# Patient Record
Sex: Male | Born: 1950 | Race: White | Hispanic: No | Marital: Married | Smoking: Former smoker
Health system: Southern US, Community
[De-identification: ages and names within clinical notes are randomized; demographics above are authoritative.]

## PROBLEM LIST (undated history)

## (undated) DIAGNOSIS — T7840XA Allergy, unspecified, initial encounter: Secondary | ICD-10-CM

## (undated) DIAGNOSIS — I10 Essential (primary) hypertension: Secondary | ICD-10-CM

## (undated) DIAGNOSIS — K219 Gastro-esophageal reflux disease without esophagitis: Secondary | ICD-10-CM

## (undated) DIAGNOSIS — E785 Hyperlipidemia, unspecified: Secondary | ICD-10-CM

## (undated) DIAGNOSIS — Z8042 Family history of malignant neoplasm of prostate: Secondary | ICD-10-CM

## (undated) DIAGNOSIS — W57XXXA Bitten or stung by nonvenomous insect and other nonvenomous arthropods, initial encounter: Secondary | ICD-10-CM

## (undated) DIAGNOSIS — Z923 Personal history of irradiation: Secondary | ICD-10-CM

## (undated) DIAGNOSIS — J45909 Unspecified asthma, uncomplicated: Secondary | ICD-10-CM

## (undated) DIAGNOSIS — M2041 Other hammer toe(s) (acquired), right foot: Secondary | ICD-10-CM

## (undated) DIAGNOSIS — C801 Malignant (primary) neoplasm, unspecified: Secondary | ICD-10-CM

## (undated) DIAGNOSIS — Z0000 Encounter for general adult medical examination without abnormal findings: Secondary | ICD-10-CM

## (undated) DIAGNOSIS — Z8669 Personal history of other diseases of the nervous system and sense organs: Secondary | ICD-10-CM

## (undated) DIAGNOSIS — M1990 Unspecified osteoarthritis, unspecified site: Secondary | ICD-10-CM

## (undated) DIAGNOSIS — M549 Dorsalgia, unspecified: Secondary | ICD-10-CM

## (undated) DIAGNOSIS — M67431 Ganglion, right wrist: Secondary | ICD-10-CM

## (undated) DIAGNOSIS — C4490 Unspecified malignant neoplasm of skin, unspecified: Secondary | ICD-10-CM

## (undated) DIAGNOSIS — N400 Enlarged prostate without lower urinary tract symptoms: Secondary | ICD-10-CM

## (undated) DIAGNOSIS — Z803 Family history of malignant neoplasm of breast: Secondary | ICD-10-CM

## (undated) DIAGNOSIS — R600 Localized edema: Secondary | ICD-10-CM

## (undated) DIAGNOSIS — S82899A Other fracture of unspecified lower leg, initial encounter for closed fracture: Secondary | ICD-10-CM

## (undated) DIAGNOSIS — I4892 Unspecified atrial flutter: Secondary | ICD-10-CM

## (undated) HISTORY — DX: Other hammer toe(s) (acquired), right foot: M20.41

## (undated) HISTORY — DX: Other fracture of unspecified lower leg, initial encounter for closed fracture: S82.899A

## (undated) HISTORY — PX: OTHER SURGICAL HISTORY: SHX169

## (undated) HISTORY — DX: Hyperlipidemia, unspecified: E78.5

## (undated) HISTORY — PX: HERNIA REPAIR: SHX51

## (undated) HISTORY — PX: ANKLE SURGERY: SHX546

## (undated) HISTORY — DX: Unspecified malignant neoplasm of skin, unspecified: C44.90

## (undated) HISTORY — DX: Family history of malignant neoplasm of prostate: Z80.42

## (undated) HISTORY — DX: Benign prostatic hyperplasia without lower urinary tract symptoms: N40.0

## (undated) HISTORY — DX: Family history of malignant neoplasm of breast: Z80.3

## (undated) HISTORY — DX: Unspecified asthma, uncomplicated: J45.909

## (undated) HISTORY — DX: Malignant (primary) neoplasm, unspecified: C80.1

## (undated) HISTORY — DX: Dorsalgia, unspecified: M54.9

## (undated) HISTORY — DX: Essential (primary) hypertension: I10

## (undated) HISTORY — PX: TONSILLECTOMY: SUR1361

## (undated) HISTORY — DX: Allergy, unspecified, initial encounter: T78.40XA

## (undated) HISTORY — PX: FRACTURE SURGERY: SHX138

## (undated) HISTORY — DX: Ganglion, right wrist: M67.431

## (undated) HISTORY — DX: Gastro-esophageal reflux disease without esophagitis: K21.9

## (undated) HISTORY — DX: Localized edema: R60.0

## (undated) HISTORY — DX: Personal history of other diseases of the nervous system and sense organs: Z86.69

## (undated) HISTORY — PX: EYE SURGERY: SHX253

## (undated) HISTORY — DX: Bitten or stung by nonvenomous insect and other nonvenomous arthropods, initial encounter: W57.XXXA

## (undated) HISTORY — DX: Unspecified osteoarthritis, unspecified site: M19.90

## (undated) HISTORY — DX: Encounter for general adult medical examination without abnormal findings: Z00.00

---

## 1996-10-13 DIAGNOSIS — S82899A Other fracture of unspecified lower leg, initial encounter for closed fracture: Secondary | ICD-10-CM

## 1996-10-13 HISTORY — DX: Other fracture of unspecified lower leg, initial encounter for closed fracture: S82.899A

## 2002-08-04 HISTORY — PX: COLONOSCOPY: SHX174

## 2002-08-09 ENCOUNTER — Encounter (INDEPENDENT_AMBULATORY_CARE_PROVIDER_SITE_OTHER): Payer: Self-pay | Admitting: *Deleted

## 2006-09-07 ENCOUNTER — Encounter: Admission: RE | Admit: 2006-09-07 | Discharge: 2006-09-07 | Payer: Self-pay | Admitting: Family Medicine

## 2006-10-13 HISTORY — PX: INGUINAL HERNIA REPAIR: SHX194

## 2007-08-19 ENCOUNTER — Ambulatory Visit (HOSPITAL_COMMUNITY): Admission: RE | Admit: 2007-08-19 | Discharge: 2007-08-19 | Payer: Self-pay | Admitting: Surgery

## 2009-01-05 ENCOUNTER — Ambulatory Visit: Payer: Self-pay | Admitting: Internal Medicine

## 2009-01-05 DIAGNOSIS — J309 Allergic rhinitis, unspecified: Secondary | ICD-10-CM

## 2009-01-05 DIAGNOSIS — E782 Mixed hyperlipidemia: Secondary | ICD-10-CM

## 2009-01-05 DIAGNOSIS — F528 Other sexual dysfunction not due to a substance or known physiological condition: Secondary | ICD-10-CM

## 2009-01-05 DIAGNOSIS — I10 Essential (primary) hypertension: Secondary | ICD-10-CM | POA: Insufficient documentation

## 2009-01-05 LAB — CONVERTED CEMR LAB
BUN: 12 mg/dL (ref 6–23)
Bilirubin, Direct: 0.1 mg/dL (ref 0.0–0.3)
Calcium: 9.4 mg/dL (ref 8.4–10.5)
Cholesterol: 173 mg/dL (ref 0–200)
Creatinine, Ser: 0.8 mg/dL (ref 0.4–1.5)
Eosinophils Relative: 2.8 % (ref 0.0–5.0)
GFR calc non Af Amer: 106 mL/min (ref >60)
Glucose, Bld: 109 mg/dL — ABNORMAL HIGH (ref 70–99)
Hemoglobin: 16.1 g/dL (ref 13.0–17.0)
Lymphocytes Relative: 20.4 % (ref 12.0–46.0)
MCHC: 35.3 g/dL (ref 30.0–36.0)
MCV: 94.6 fL (ref 78.0–100.0)
Monocytes Relative: 9.3 % (ref 3.0–12.0)
Platelets: 209 10*3/uL (ref 150.0–400.0)
RBC: 4.83 M/uL (ref 4.22–5.81)
RDW: 12.1 % (ref 11.5–14.6)
TSH: 0.99 microintl units/mL (ref 0.35–5.50)
Testosterone: 367.81 ng/dL (ref 350.00–890.00)
Total Bilirubin: 1.5 mg/dL — ABNORMAL HIGH (ref 0.3–1.2)
Total CHOL/HDL Ratio: 3
WBC: 9.4 10*3/uL (ref 4.5–10.5)

## 2009-01-07 ENCOUNTER — Encounter: Payer: Self-pay | Admitting: Internal Medicine

## 2009-02-01 ENCOUNTER — Ambulatory Visit: Payer: Self-pay | Admitting: Internal Medicine

## 2009-02-01 DIAGNOSIS — L259 Unspecified contact dermatitis, unspecified cause: Secondary | ICD-10-CM

## 2009-02-15 ENCOUNTER — Telehealth: Payer: Self-pay | Admitting: Internal Medicine

## 2009-03-01 ENCOUNTER — Ambulatory Visit: Payer: Self-pay | Admitting: Internal Medicine

## 2009-03-01 ENCOUNTER — Ambulatory Visit: Payer: Self-pay | Admitting: Diagnostic Radiology

## 2009-03-01 ENCOUNTER — Ambulatory Visit (HOSPITAL_BASED_OUTPATIENT_CLINIC_OR_DEPARTMENT_OTHER): Admission: RE | Admit: 2009-03-01 | Discharge: 2009-03-01 | Payer: Self-pay | Admitting: Internal Medicine

## 2009-03-01 ENCOUNTER — Telehealth: Payer: Self-pay | Admitting: Family Medicine

## 2009-03-01 ENCOUNTER — Telehealth: Payer: Self-pay | Admitting: Internal Medicine

## 2009-03-01 DIAGNOSIS — J189 Pneumonia, unspecified organism: Secondary | ICD-10-CM | POA: Insufficient documentation

## 2009-03-01 DIAGNOSIS — R509 Fever, unspecified: Secondary | ICD-10-CM

## 2009-03-01 LAB — CONVERTED CEMR LAB
ALT: 26 units/L (ref 0–53)
AST: 19 units/L (ref 0–37)
Alkaline Phosphatase: 44 units/L (ref 39–117)
Basophils Absolute: 0 10*3/uL (ref 0.0–0.1)
Bilirubin, Direct: 0.1 mg/dL (ref 0.0–0.3)
CO2: 30 meq/L (ref 19–32)
Chloride: 102 meq/L (ref 96–112)
Creatinine, Ser: 1.1 mg/dL (ref 0.4–1.5)
Eosinophils Relative: 0.8 % (ref 0.0–5.0)
GFR calc non Af Amer: 73.36 mL/min (ref >60)
HCT: 44.8 % (ref 39.0–52.0)
Lymphocytes Relative: 10 % — ABNORMAL LOW (ref 12.0–46.0)
MCHC: 34.7 g/dL (ref 30.0–36.0)
MCV: 93.3 fL (ref 78.0–100.0)
Monocytes Relative: 11.5 % (ref 3.0–12.0)
Platelets: 173 10*3/uL (ref 150.0–400.0)

## 2009-04-06 ENCOUNTER — Ambulatory Visit (HOSPITAL_BASED_OUTPATIENT_CLINIC_OR_DEPARTMENT_OTHER): Admission: RE | Admit: 2009-04-06 | Discharge: 2009-04-06 | Payer: Self-pay | Admitting: Internal Medicine

## 2009-04-06 ENCOUNTER — Ambulatory Visit: Payer: Self-pay | Admitting: Internal Medicine

## 2009-04-06 ENCOUNTER — Ambulatory Visit: Payer: Self-pay | Admitting: Diagnostic Radiology

## 2009-04-06 ENCOUNTER — Telehealth: Payer: Self-pay | Admitting: Internal Medicine

## 2009-06-26 ENCOUNTER — Telehealth: Payer: Self-pay | Admitting: Internal Medicine

## 2009-08-01 ENCOUNTER — Encounter: Payer: Self-pay | Admitting: Internal Medicine

## 2009-10-03 ENCOUNTER — Encounter: Payer: Self-pay | Admitting: Internal Medicine

## 2009-10-15 ENCOUNTER — Ambulatory Visit: Payer: Self-pay | Admitting: Internal Medicine

## 2009-10-15 DIAGNOSIS — R109 Unspecified abdominal pain: Secondary | ICD-10-CM

## 2009-10-16 ENCOUNTER — Telehealth: Payer: Self-pay | Admitting: Internal Medicine

## 2009-10-16 LAB — CONVERTED CEMR LAB
CO2: 26 meq/L (ref 19–32)
Calcium: 9.7 mg/dL (ref 8.4–10.5)
Chloride: 100 meq/L (ref 96–112)
Potassium: 4.9 meq/L (ref 3.5–5.3)
Sodium: 138 meq/L (ref 135–145)

## 2009-11-02 ENCOUNTER — Ambulatory Visit: Payer: Self-pay | Admitting: Internal Medicine

## 2009-11-02 DIAGNOSIS — L0100 Impetigo, unspecified: Secondary | ICD-10-CM | POA: Insufficient documentation

## 2009-11-07 ENCOUNTER — Ambulatory Visit: Payer: Self-pay | Admitting: Internal Medicine

## 2009-11-07 DIAGNOSIS — J029 Acute pharyngitis, unspecified: Secondary | ICD-10-CM

## 2010-03-20 ENCOUNTER — Encounter: Payer: Self-pay | Admitting: Internal Medicine

## 2010-03-20 LAB — CONVERTED CEMR LAB
ALT: 21 units/L (ref 0–53)
AST: 20 units/L (ref 0–37)
Albumin: 4.8 g/dL (ref 3.5–5.2)
Alkaline Phosphatase: 43 units/L (ref 39–117)
Basophils Relative: 0 % (ref 0–1)
Bilirubin, Direct: 0.2 mg/dL (ref 0.0–0.3)
Calcium: 9.7 mg/dL (ref 8.4–10.5)
Eosinophils Absolute: 0.3 10*3/uL (ref 0.0–0.7)
Eosinophils Relative: 3 % (ref 0–5)
Hemoglobin: 15.4 g/dL (ref 13.0–17.0)
Indirect Bilirubin: 0.8 mg/dL (ref 0.0–0.9)
LDL Cholesterol: 103 mg/dL — ABNORMAL HIGH (ref 0–99)
Lymphocytes Relative: 24 % (ref 12–46)
Monocytes Absolute: 0.7 10*3/uL (ref 0.1–1.0)
Monocytes Relative: 9 % (ref 3–12)
Potassium: 3.7 meq/L (ref 3.5–5.3)
RBC: 4.75 M/uL (ref 4.22–5.81)
RDW: 13.5 % (ref 11.5–15.5)
Sodium: 139 meq/L (ref 135–145)
Total Bilirubin: 1 mg/dL (ref 0.3–1.2)
Total Protein: 7.5 g/dL (ref 6.0–8.3)
WBC: 8.2 10*3/uL (ref 4.0–10.5)

## 2010-03-26 ENCOUNTER — Ambulatory Visit: Payer: Self-pay | Admitting: Internal Medicine

## 2010-03-26 DIAGNOSIS — K219 Gastro-esophageal reflux disease without esophagitis: Secondary | ICD-10-CM

## 2010-07-05 ENCOUNTER — Encounter (INDEPENDENT_AMBULATORY_CARE_PROVIDER_SITE_OTHER): Payer: Self-pay | Admitting: *Deleted

## 2010-07-05 ENCOUNTER — Encounter: Payer: Self-pay | Admitting: Internal Medicine

## 2010-07-05 DIAGNOSIS — L989 Disorder of the skin and subcutaneous tissue, unspecified: Secondary | ICD-10-CM | POA: Insufficient documentation

## 2010-07-17 ENCOUNTER — Telehealth: Payer: Self-pay | Admitting: Internal Medicine

## 2010-07-22 ENCOUNTER — Telehealth: Payer: Self-pay | Admitting: Internal Medicine

## 2010-07-29 ENCOUNTER — Ambulatory Visit: Payer: Self-pay | Admitting: Internal Medicine

## 2010-07-29 ENCOUNTER — Encounter (INDEPENDENT_AMBULATORY_CARE_PROVIDER_SITE_OTHER): Payer: Self-pay | Admitting: *Deleted

## 2010-08-05 ENCOUNTER — Telehealth: Payer: Self-pay | Admitting: Internal Medicine

## 2010-08-08 ENCOUNTER — Telehealth (INDEPENDENT_AMBULATORY_CARE_PROVIDER_SITE_OTHER): Payer: Self-pay | Admitting: *Deleted

## 2010-08-14 ENCOUNTER — Encounter: Payer: Self-pay | Admitting: Internal Medicine

## 2010-08-22 ENCOUNTER — Ambulatory Visit: Payer: Self-pay | Admitting: Internal Medicine

## 2010-08-22 LAB — HM COLONOSCOPY

## 2010-08-29 ENCOUNTER — Encounter: Payer: Self-pay | Admitting: Internal Medicine

## 2010-10-25 ENCOUNTER — Telehealth: Payer: Self-pay | Admitting: Internal Medicine

## 2010-11-12 NOTE — Progress Notes (Signed)
  Phone Note Outgoing Call   Summary of Call: call pt - electrolytes and kidney function is normal Initial call taken by: D. Thomos Lemons DO,  October 16, 2009 9:24 AM  Follow-up for Phone Call        informed pt. that his electrolytes and kidney function test were normal.Jennifer Rich  October 16, 2009 1:37 PM

## 2010-11-12 NOTE — Progress Notes (Signed)
Summary: Dermatology  ---- Converted from flag ---- ---- 07/17/2010 8:35 AM, D. Thomos Lemons DO wrote: plz find out whether this pt already seen by dermatologist ------------------------------  Phone Note Outgoing Call   Call placed by: Glendell Docker CMA,  July 17, 2010 9:48 AM Call placed to: Patient Summary of Call: call placed to patient  at [phone] regarding dermatology appointment. He states that he had a follow up a week ago today with Dr  Katrinka Blazing. He states he had the area lanced and since then it is healing well. He states he has been keeping a bandaid on it with ointment Initial call taken by: Glendell Docker CMA,  July 17, 2010 9:51 AM

## 2010-11-12 NOTE — Letter (Signed)
Summary: Pre Visit Letter Revised  [facility]  [address] [place], [place] [address]   Phone: [phone]  Fax: [phone]        07/05/2010 MRN: [id number] William Phillips [address] [place], [place]  [address]             Procedure Date:  08/13/2010   Welcome to the Gastroenterology Division at [facility].    You are scheduled to see a nurse for your pre-procedure visit on 07/29/2010 at 1:30PM on the 3rd floor at [facility], [address]  We ask that you try to arrive at our office 15 minutes prior to your appointment time to allow for check-in.  Please take a minute to review the attached form.  If you answer "Yes" to one or more of the questions on the first page, we ask that you call the person listed at your earliest opportunity.  If you answer "No" to all of the questions, please complete the rest of the form and bring it to your appointment.    Your nurse visit will consist of discussing your medical and surgical history, your immediate family medical history, and your medications.   If you are unable to list all of your medications on the form, please bring the medication bottles to your appointment and we will list them.  We will need to be aware of both prescribed and over the counter drugs.  We will need to know exact dosage information as well.    Please be prepared to read and sign documents such as consent forms, a financial agreement, and acknowledgement forms.  If necessary, and with your consent, a friend or relative is welcome to sit-in on the nurse visit with you.  Please bring your insurance card so that we may make a copy of it.  If your insurance requires a referral to see a specialist, please bring your referral form from your primary care physician.  No co-pay is required for this nurse visit.     If you cannot keep your appointment, please call [phone] to cancel or reschedule prior to your appointment date.  This  allows [place] the opportunity to schedule an appointment for another patient in need of care.    Thank you for choosing [facility] for your medical needs.  We appreciate the opportunity to care for you.  Please visit [place] at our website  to learn more about our practice.  Sincerely, The Gastroenterology Division

## 2010-11-12 NOTE — Assessment & Plan Note (Signed)
Summary: 6 months rov-ch   Vital Signs:  Patient profile:   60 year old Phillips Weight:      198 pounds O2 Sat:      96 % on Room air Pulse rate:   William / minute BP sitting:   120 / 80  O2 Flow:  Room air  Primary Care Provider:  Dondra Spry DO   History of Present Illness:  Hypertension Follow-Up      This is a 60 year old man who presents for Hypertension follow-up.  The patient denies lightheadedness and headaches.  The patient denies the following associated symptoms: chest pain.  Compliance with medications (by patient report) has been near 100%.  The patient reports that dietary compliance has been fair.    left groin - had diagnostic lap.  scar tissue.  currently asymptomatic  hx of pna last year - no recurrent cough  Allergies: 1)  ! Pcn  Past History:  Past Medical History: Allergic rhinitis Hyperlipidemia   Hypertension  Hx of carpal tunnel syndrome Ankle fracture 1998   Family History: Family History High cholesterol Family History Hypertension CAD - father MI age 62 DM II - MGF      Physical Exam  General:  alert, well-developed, and well-nourished.   Neck:  No deformities, masses, or tenderness noted. Lungs:  normal respiratory effort and normal breath sounds.   Heart:  normal rate, regular rhythm, and no gallop.   Extremities:  trace left pedal edema and trace right pedal edema.     Impression & Recommendations:  Problem # 1:  HYPERTENSION (ICD-401.9) well controlled.  Maintain current medication regimen.  His updated medication list for this problem includes:    Quinapril-hydrochlorothiazide 20-12.5 Mg Tabs (Quinapril-hydrochlorothiazide) .Marland Kitchen... Take 1 tablet by mouth once a day    Amlodipine Besylate 5 Mg Tabs (Amlodipine besylate) .Marland Kitchen... Take 1 tablet by mouth once a day  Orders: T-Basic Metabolic Panel [phone])  BP today: 120/80 Prior BP: 120/70 (04/06/2009)  Labs Reviewed: K+: 3.9 (03/01/2009) Creat: : 1.1 (03/01/2009)   Chol:  173 (01/05/2009)   HDL: 61.50 (01/05/2009)   LDL: 93 (01/05/2009)   TG: 95.0 (01/05/2009)  Problem # 2:  INGUINAL PAIN, LEFT (ICD-789.09) Pt seen by his surgeon - Dr. Michaell Cowing.  He had diagnostic laporascopy.  he reports scar tissue.  no recurrent hernia.  he decreased physical activity.  he is asymptomatic  His updated medication list for this problem includes:    Baby Aspirin 81 Mg Chew (Aspirin) .Marland Kitchen... Take 1 tablet by mouth once a day  Complete Medication List: 1)  Quinapril-hydrochlorothiazide 20-12.5 Mg Tabs (Quinapril-hydrochlorothiazide) .... Take 1 tablet by mouth once a day 2)  Amlodipine Besylate 5 Mg Tabs (Amlodipine besylate) .... Take 1 tablet by mouth once a day 3)  Simvastatin 20 Mg Tabs (Simvastatin) .... Take 1 tab by mouth at bedtime 4)  Baby Aspirin 81 Mg Chew (Aspirin) .... Take 1 tablet by mouth once a day 5)  Multivitamins Tabs (Multiple vitamin) .... Take 1 tablet by mouth once a day 6)  Viagra 100 Mg Tabs (Sildenafil citrate) .... 1/2 to one tab by mouth once daily as needed 7)  Triamcinolone Acetonide 0.1 % Crea (Triamcinolone acetonide) .... Apply two times a day  Patient Instructions: 1)  Please schedule a follow-up appointment in 6 months for CPX 2)  BMP prior to visit, ICD-9: 401.9 3)  Hepatic Panel prior to visit, ICD-9: 272.4 4)  Lipid Panel prior to visit, ICD-9: 272.4 5)  TSH prior to visit, ICD-9: 272.4 6)  CBC w/ Diff prior to visit, ICD-9: 401.9 7)  Please return for lab work one (1) week before your next appointment.  Prescriptions: TRIAMCINOLONE ACETONIDE 0.1 % CREA (TRIAMCINOLONE ACETONIDE) apply two times a day  #30 grams x 1   Entered and Authorized by:   D. Thomos Lemons DO   Signed by:   D. Thomos Lemons DO on 10/15/2009   Method used:   [facility]        [id number]       [address] [place], [place]  [address]       Ph: [phone] or [phone]       Fax: [phone]   RxID:    [id number] SIMVASTATIN 20 MG TABS (SIMVASTATIN) Take 1 tab by mouth at bedtime  #90 x 3   Entered and Authorized by:   D. Thomos Lemons DO   Signed by:   D. Thomos Lemons DO on 10/15/2009   Method used:   Electronically to        [facility] [phone]* (retail)       [address] [place], [place]  [address]       Ph: [phone]       Fax: [phone]   RxID:   [id number] AMLODIPINE BESYLATE 5 MG TABS (AMLODIPINE BESYLATE) Take 1 tablet by mouth once a day  #90 x 3   Entered and Authorized by:   D. Thomos Lemons DO   Signed by:   D. Thomos Lemons DO on 10/15/2009   Method used:   Electronically to        [facility] [phone]* (retail)       [address] [place], [place]  [address]       Ph: [phone]       Fax: [phone]   RxID:   [id number] QUINAPRIL-HYDROCHLOROTHIAZIDE 20-12.5 MG TABS (QUINAPRIL-HYDROCHLOROTHIAZIDE) Take 1 tablet by mouth once a day  #90 x 3   Entered and Authorized by:   D. Thomos Lemons DO   Signed by:   D. Thomos Lemons DO on 10/15/2009   Method used:   Electronically to        [facility] [phone]* (retail)       [address] [place], [place]  [address]       Ph: [phone]       Fax: [phone]   RxID:   [id number]      Influenza Immunization History:    Influenza # 1:  Historical (07/25/2009)

## 2010-11-12 NOTE — Letter (Signed)
Summary: [facility] Instructions  [facility]  [address] [place], [place] [address]   Phone: [phone]  Fax: [phone]       William Phillips    1951-08-12    MRN: [id number]        Procedure Day /Date:  Thursday 08/22/2010     Arrival Time: 2:30 pm     Procedure Time: 3:30 pm     Location of Procedure:                    _x _   [facility] (4th Floor)   PREPARATION FOR COLONOSCOPY WITH MOVIPREP   Starting 5 days prior to your procedure Saturday 11/5 do not eat nuts, seeds, popcorn, corn, beans, peas,  salads, or any raw vegetables.  Do not take any fiber supplements (e.g. Metamucil, Citrucel, and Benefiber).  THE DAY BEFORE YOUR PROCEDURE         DATE: Wednesday 11/9 1.  Drink clear liquids the entire day-NO SOLID FOOD  2.  Do not drink anything colored red or purple.  Avoid juices with pulp.  No orange juice.  3.  Drink at least 64 oz. (8 glasses) of fluid/clear liquids during the day to prevent dehydration and help the prep work efficiently.  CLEAR LIQUIDS INCLUDE: Water Jello Ice Popsicles Tea (sugar ok, no milk/cream) Powdered fruit flavored drinks Coffee (sugar ok, no milk/cream) Gatorade Juice: apple, white grape, white cranberry  Lemonade Clear bullion, consomm, broth Carbonated beverages (any kind) Strained chicken noodle soup Hard Candy                             4.  In the morning, mix first dose of MoviPrep solution:    Empty 1 Pouch A and 1 Pouch B into the disposable container    Add lukewarm drinking water to the top line of the container. Mix to dissolve    Refrigerate (mixed solution should be used within 24 hrs)  5.  Begin drinking the prep at 5:00 p.m. The MoviPrep container is divided by 4 marks.   Every 15 minutes drink the solution down to the next mark (approximately 8 oz) until the full liter is complete.   6.  Follow completed prep with 16 oz of clear liquid of your choice (Nothing red or purple).   Continue to drink clear liquids until bedtime.  7.  Before going to bed, mix second dose of MoviPrep solution:    Empty 1 Pouch A and 1 Pouch B into the disposable container    Add lukewarm drinking water to the top line of the container. Mix to dissolve    Refrigerate  THE DAY OF YOUR PROCEDURE      DATE: Thursday 08/22/2010  Beginning at 10:30 a.m. (5 hours before procedure):         1. Every 15 minutes, drink the solution down to the next mark (approx 8 oz) until the full liter is complete.  2. Follow completed prep with 16 oz. of clear liquid of your choice.    3. You may drink clear liquids until 1:30 pm  (2 HOURS BEFORE PROCEDURE).   MEDICATION INSTRUCTIONS  Unless otherwise instructed, you should take regular prescription medications with a small sip of water   as early as possible the morning of your procedure.  Additional medication instructions: Do not take Quinapril/HCTZ day of procedure.         OTHER  INSTRUCTIONS  You will need a responsible adult at least 59 years of age to accompany you and drive you home.   This person must remain in the waiting room during your procedure.  Wear loose fitting clothing that is easily removed.  Leave jewelry and other valuables at home.  However, you may wish to bring a book to read or  an iPod/MP3 player to listen to music as you wait for your procedure to start.  Remove all body piercing jewelry and leave at home.  Total time from sign-in until discharge is approximately 2-3 hours.  You should go home directly after your procedure and rest.  You can resume normal activities the  day after your procedure.  The day of your procedure you should not:   Drive   Make legal decisions   Operate machinery   Drink alcohol   Return to work  You will receive specific instructions about eating, activities and medications before you leave.    The above instructions have been reviewed and explained to me by   Ezra Sites RN  July 29, 2010 3:14 PM    I fully understand and can verbalize these instructions _____________________________ Date _________

## 2010-11-12 NOTE — Assessment & Plan Note (Signed)
Summary: SORE THROAT WHITE SPOTS IN THROAT/MHF   Vital Signs:  Patient profile:   60 year old male Weight:      191.50 pounds BMI:     26.07 O2 Sat:      97 % on Room air Temp:     98.0 degrees F oral Pulse rate:   76 / minute Pulse rhythm:   regular Resp:     16 per minute BP sitting:   132 / 90  (left arm) Cuff size:   large  Vitals Entered By: Glendell Docker CMA (November 07, 2009 11:53 AM)  O2 Flow:  Room air  Primary Care Provider:  D. Thomos Lemons DO  CC:  Sore Throat.  History of Present Illness: 60 y/o white male recently seen for peri oral rash c/o sore throat and mouth soreness. prev rash better but not resolved no fever or chills  Allergies: 1)  ! Pcn  Past History:  Past Medical History: Allergic rhinitis Hyperlipidemia    Hypertension  Hx of carpal tunnel syndrome  Ankle fracture 1998   Past Surgical History: Inguinal herniorrhaphy  2008 Colonoscopy 08/04/2002  (Normal Exam - Dr. [name])       Family History: Family History High cholesterol Family History Hypertension CAD - father MI age 52 DM II - MGF        Social History: Occupation: Airline pilot  (Astronomer- chemicals, seed, erosion control) Married- 35 years 21 daughter -Holiday representative in   ([place] Previous smoker - 5-10 pack yrs  Alcohol use-yes (2-3 glasses of wine)      Physical Exam  General:  alert, well-developed, and well-nourished.   Ears:  R ear normal and L ear normal.   Mouth:  pharynx slightly red.  peri oral redness (improved) Abdomen:  soft, non-tender, and normal bowel sounds.   Extremities:  No lower extremity edema    Impression & Recommendations:  Problem # 1:  PHARYNGITIS (ICD-462) I suspect sore throat from oral thrush.  use clotrimazole troche.  Patient advised to call office if symptoms persist or worsen.  His updated medication list for this problem includes:    Baby Aspirin 81 Mg Chew (Aspirin) .Marland Kitchen... Take 1 tablet by mouth once a day    Clindamycin  Hcl 300 Mg Caps (Clindamycin hcl) ..... One by mouth tid    Clotrimazole 10 Mg Troc (Clotrimazole) ..... Use 5 x day  Complete Medication List: 1)  Quinapril-hydrochlorothiazide 20-12.5 Mg Tabs (Quinapril-hydrochlorothiazide) .... Take 1 tablet by mouth once a day 2)  Amlodipine Besylate 5 Mg Tabs (Amlodipine besylate) .... Take 1 tablet by mouth once a day 3)  Simvastatin 20 Mg Tabs (Simvastatin) .... Take 1 tab by mouth at bedtime 4)  Baby Aspirin 81 Mg Chew (Aspirin) .... Take 1 tablet by mouth once a day 5)  Multivitamins Tabs (Multiple vitamin) .... Take 1 tablet by mouth once a day 6)  Viagra 100 Mg Tabs (Sildenafil citrate) .... 1/2 to one tab by mouth once daily as needed 7)  Triamcinolone Acetonide 0.1 % Crea (Triamcinolone acetonide) .... Apply two times a day 8)  Clindamycin Hcl 300 Mg Caps (Clindamycin hcl) .... One by mouth tid 9)  Mupirocin 2 % Oint (Mupirocin) .... Apply three times a day 10)  Clotrimazole 10 Mg Troc (Clotrimazole) .... Use 5 x day  Patient Instructions: 1)  Call our office if your symptoms do not  improve or gets worse. Prescriptions: CLOTRIMAZOLE 10 MG TROC (CLOTRIMAZOLE) use 5 x day  #50 x  0   Entered and Authorized by:   D. Thomos Lemons DO   Signed by:   D. Thomos Lemons DO on 11/07/2009   Method used:   [facility]        [id number]       [address]       [place], [place]  [address]       Ph: [phone] or [phone]       Fax: [phone]   RxID:   [id number]   Current Allergies (reviewed today): ! PCN

## 2010-11-12 NOTE — Progress Notes (Signed)
Summary: Due for a Colon?  Phone Note Call from Patient Call back at [phone] cell   Call For: Dr [name] Summary of Call: Was told he would get a call back after his chart was reviewed and decided if another Colon is needed. Initial call taken by: Leanor Kail [facility],  August 05, 2010 1:39 PM  Follow-up for Phone Call        called patient and talked to wife.  Advised it was ok to keep appt. as scheduled.   Follow-up by: Milford Cage NCMA,  August 05, 2010 4:56 PM

## 2010-11-12 NOTE — Assessment & Plan Note (Signed)
Summary: cpx/mhf rsc with pt/mhf--Rm 2   Vital Signs:  Patient profile:   60 year old male Height:      72 inches Weight:      186.75 pounds BMI:     25.42 Temp:     98.3 degrees F oral Pulse rate:   78 / minute Pulse rhythm:   regular Resp:     16 per minute BP sitting:   118 / 76  (right arm) Cuff size:   regular  Vitals Entered By: Mervin Kung CMA (March 26, 2010 8:26 AM) CC: Room 2  Physical. Is Patient Diabetic? No Comments Pt has completed Clindamycin.   Primary Care Provider:  Dondra Spry DO  CC:  Room 2  Physical..  History of Present Illness: 60 y/o with hx of htn and hyperlipidemia for routine cpx overall doing well,  good med compliance  Allergies: 1)  ! Pcn  Past History:  Past Medical History: Allergic rhinitis Hyperlipidemia    Hypertension   Hx of carpal tunnel syndrome  Ankle fracture 1998   Past Surgical History: Inguinal herniorrhaphy  2008 Colonoscopy 08/04/2002  (Normal Exam - Dr. 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    Physical Exam  General:  alert, well-developed, and well-nourished.   Head:  normocephalic and atraumatic.   Mouth:  pharynx pink and moist.   Neck:  No deformities, masses, or tenderness noted.no carotid bruits.   Lungs:  normal respiratory effort and normal breath sounds.   Heart:  normal rate, regular rhythm, no murmur, and no gallop.   Abdomen:  soft, non-tender, normal bowel sounds, no hepatomegaly, and no splenomegaly.     Extremities:  No lower extremity edema  Neurologic:  cranial nerves II-XII intact and gait normal.   Psych:  normally interactive, good eye contact, not anxious appearing, and not depressed appearing.     Impression & Recommendations:  Problem # 1:  HEALTH MAINTENANCE EXAM (ICD-V70.0)  Reviewed adult health maintenance protocols.  I encouraged resistance exercises to maintain muscle mass.  Colonoscopy: normal (01/09/2004) Td Booster: Tdap (01/07/2005)   Flu Vax: Historical (07/25/2009)   Chol: 191 (03/20/2010)   HDL: 63 (03/20/2010)   LDL: 103 (03/20/2010)   TG: 127 (03/20/2010) TSH: 1.572 (03/20/2010)   PSA: 1.16 (01/05/2009)  Orders: EKG w/ Interpretation (93000)  Problem # 2:  GERD (ICD-530.81) Pt with intermittent reflux symptoms.  he notes occ tight sensation in upper esophagus.  no neck mass. Use PPI as directed.  anti reflux handout provided.  His updated medication list for this problem includes:    Omeprazole 20 Mg Cpdr (Omeprazole) ..... One by mouth once daily 15 mins before am  Complete Medication List: 1)  Quinapril-hydrochlorothiazide 20-12.5 Mg Tabs (Quinapril-hydrochlorothiazide) .... Take 1 tablet by mouth once a day 2)  Amlodipine Besylate 5 Mg Tabs (Amlodipine besylate) .... Take 1 tablet by mouth once a day 3)  Simvastatin 20 Mg Tabs (Simvastatin) .... Take 1 tab by mouth  at bedtime 4)  Baby Aspirin 81 Mg Chew (Aspirin) .... Take 1 tablet by mouth once a day 5)  Multivitamins Tabs (Multiple vitamin) .... Take 1 tablet by mouth once a day 6)  Viagra 100 Mg Tabs (Sildenafil citrate) .... 1/2 to one tab by mouth once daily as needed 7)  Triamcinolone Acetonide 0.1 % Crea (Triamcinolone acetonide) .... Apply two times a day 8)  Omeprazole 20 Mg Cpdr (Omeprazole) .... One by mouth once daily 15 mins before am  Patient Instructions: 1)  If you stop taking omeprazole in 3 to 6 months,  use zantac (ranitidine 150 mg two times a day x 1 week, then by mouth once daily  x 1 week) to avoid rebound symptoms 2)  Please schedule a follow-up appointment in 6 months. Prescriptions: OMEPRAZOLE 20 MG CPDR (OMEPRAZOLE) one by mouth once daily 15 mins before AM  #90 x 1   Entered and Authorized by:   D. Thomos Lemons DO   Signed by:   D. Thomos Lemons DO on 03/26/2010   Method used:   [facility]        [id number]       [address]       [place], [place]  [address]       Ph: [phone] or [phone]       Fax: [phone]   RxID:   [id number]       Current Allergies (reviewed today): ! PCN

## 2010-11-12 NOTE — Miscellaneous (Signed)
Summary: [facility]  Clinical Lists Changes  Medications: Added new medication of MOVIPREP 100 GM  SOLR (PEG-KCL-NACL-NASULF-NA ASC-C) As per prep instructions. - Signed Rx of MOVIPREP 100 GM  SOLR (PEG-KCL-NACL-NASULF-NA ASC-C) As per prep instructions.;  #1 x 0;  Signed;  Entered by: Ezra Sites RN;  Authorized by: Hilarie Fredrickson MD;  Method used[phone] [address] [place], [place], [place]  [address], Ph: [phone] or [phone], Fax: [phone] Observations: Added new observation of ALLERGY REV: Done (07/29/2010 13:39)    Prescriptions: MOVIPREP 100 GM  SOLR (PEG-KCL-NACL-NASULF-NA ASC-C) As per prep instructions.  #1 x 0   Entered by:   Ezra Sites RN   Authorized by:   Hilarie Fredrickson MD   Signed by:   Ezra Sites RN on 07/29/2010   Method used:   [facility]        [facility] [phone]       [address]       [place], [place]  [address]       Ph: [phone] or [phone]       Fax: [phone]   RxID:   [id number]

## 2010-11-12 NOTE — Letter (Signed)
Summary: [facility]   Imported By: Maryln Gottron 09/10/2010 14:02:34  _____________________________________________________________________  External Attachment:    Type:   Image     Comment:   External Document

## 2010-11-12 NOTE — Assessment & Plan Note (Signed)
Summary: rash around mouth/mhf   Vital Signs:  Patient profile:   60 year old male Weight:      193 pounds BMI:     26.27 O2 Sat:      98 % on Room air Temp:     97.8 degrees F oral Pulse rate:   68 / minute Pulse rhythm:   regular Resp:     16 per minute BP sitting:   122 / 90  (right arm) Cuff size:   large  Vitals Entered By: Glendell Docker CMA (November 02, 2009 10:52 AM)  O2 Flow:  Room air  Primary Care Provider:  D. Thomos Lemons DO  CC:  Rash around mouth.  History of Present Illness: c/o sudden onset of rash at the sides of mouth on Sunday with lip sensitivity. Patient states he used Blistex ointment with little relief.  He states clear drainage started this morning, overall the rash has worsened since Sunday.  no vesicles.  no facial rash  Allergies: 1)  ! Pcn  Past History:  Past Medical History: Allergic rhinitis Hyperlipidemia   Hypertension  Hx of carpal tunnel syndrome  Ankle fracture 1998   Past Surgical History: Inguinal herniorrhaphy  2008 Colonoscopy 08/04/2002  (Normal Exam - Dr. [name])      Family History: Family History High cholesterol Family History Hypertension CAD - father MI age 74 DM II - MGF       Social History: Occupation: Airline pilot  (Astronomer- chemicals, seed, erosion control) Married- 35 years 21 daughter -Holiday representative in   ([place] Previous smoker - 5-10 pack yrs  Alcohol use-yes (2-3 glasses of wine)    Physical Exam  General:  alert, well-developed, and well-nourished.   Ears:  R ear normal and L ear normal.   Mouth:  pharynx pink and moist.  peri oral redness,  golden crust over left corner of mouth Lungs:  normal respiratory effort and normal breath sounds.   Heart:  normal rate, regular rhythm, and no gallop.   Psych:  normally interactive and good eye contact.     Impression & Recommendations:  Problem # 1:  IMPETIGO (ICD-684) Peri oral rash c/w impetigo.  oral abx and topical abx ointment as directed.   Patient advised to call office if symptoms persist or worsen.  Complete Medication List: 1)  Quinapril-hydrochlorothiazide 20-12.5 Mg Tabs (Quinapril-hydrochlorothiazide) .... Take 1 tablet by mouth once a day 2)  Amlodipine Besylate 5 Mg Tabs (Amlodipine besylate) .... Take 1 tablet by mouth once a day 3)  Simvastatin 20 Mg Tabs (Simvastatin) .... Take 1 tab by mouth at bedtime 4)  Baby Aspirin 81 Mg Chew (Aspirin) .... Take 1 tablet by mouth once a day 5)  Multivitamins Tabs (Multiple vitamin) .... Take 1 tablet by mouth once a day 6)  Viagra 100 Mg Tabs (Sildenafil citrate) .... 1/2 to one tab by mouth once daily as needed 7)  Triamcinolone Acetonide 0.1 % Crea (Triamcinolone acetonide) .... Apply two times a day 8)  Clindamycin Hcl 300 Mg Caps (Clindamycin hcl) .... One by mouth tid 9)  Mupirocin 2 % Oint (Mupirocin) .... Apply three times a day  Patient Instructions: 1)  Please schedule a follow-up appointment in 2 weeks. 2)  Call our office if your symptoms do not  improve or gets worse. Prescriptions: MUPIROCIN 2 % OINT (MUPIROCIN) apply three times a day  #30 grams x 0   Entered and Authorized by:   D. Thomos Lemons DO   Signed  by:   Dondra Spry DO on 11/02/2009   Method used:   [facility]ically to        [id number]       [address] [place], [place]  [address]       Ph: [phone] or [phone]       Fax: [phone]   RxID:   [id number] CLINDAMYCIN HCL 300 MG CAPS (CLINDAMYCIN HCL) one by mouth tid  #21 x 0   Entered and Authorized by:   D. Thomos Lemons DO   Signed by:   D. Thomos Lemons DO on 11/02/2009   Method used:   [facility] to        [id number]       [address]       [place], [place]  [address]       Ph: [phone] or [phone]       Fax: [phone]   RxID:   [id number]   Current Allergies (reviewed today): ! PCN

## 2010-11-12 NOTE — Letter (Signed)
Summary: [facility]   Imported By: Lanelle Bal 10/24/2009 10:35:35  _____________________________________________________________________  External Attachment:    Type:   Image     Comment:   External Document

## 2010-11-12 NOTE — Procedures (Signed)
Summary: Colonoscopy  Patient: William Phillips Note: All result statuses are Final unless otherwise noted.  Tests: (1) Colonoscopy (COL)   COL Colonoscopy           DONE     [facility]     [address]     [place], [place]  [address]           COLONOSCOPY PROCEDURE REPORT           PATIENT:  [name], [name]  MR#:  [id number]     BIRTHDATE:  1950-11-11, 59 yrs. old  GENDER:  male     ENDOSCOPIST:  Wilhemina Bonito. Eda Keys, MD     REF. BY:  Thomos Lemons, D.O.     PROCEDURE DATE:  08/22/2010     PROCEDURE:  Average-risk screening colonoscopy     G0121     ASA CLASS:  Class II     INDICATIONS:  Routine Risk Screening ; negative exam [facility])     MEDICATIONS:   Fentanyl 75 mcg IV, Versed 8 mg IV, Benadryl 25 mg     IV           DESCRIPTION OF PROCEDURE:   After the risks benefits and     alternatives of the procedure were thoroughly explained, informed     consent was obtained.  Digital rectal exam was performed and     revealed no abnormalities.   The LB CF-[id number] endoscope     was introduced through the anus and advanced to the cecum, which     was identified by both the appendix and ileocecal valve, without     limitations.Time to cecum = 4:27 min.  The quality of the prep was     excellent, using MoviPrep.  The instrument was then slowly     withdrawn (time = 10:27 min) as the colon was fully examined.     <<PROCEDUREIMAGES>>           FINDINGS:  A normal appearing cecum, ileocecal valve, and     appendiceal orifice were identified. The ascending, hepatic     flexure, transverse, splenic flexure, descending, sigmoid colon,     and rectum appeared unremarkable.  No polyps or cancers were seen.     Retroflexed views in the rectum revealed no abnormalities.    The     scope was then withdrawn from the patient and the procedure     completed.           COMPLICATIONS:  None     ENDOSCOPIC IMPRESSION:     1) Normal colon     2) No polyps or cancers        RECOMMENDATIONS:     1) Continue current colorectal screening recommendations for     "routine risk" patients with a repeat colonoscopy in 10 years.           ______________________________     Wilhemina Bonito. Eda Keys, MD           CC:  Thomos Lemons, DO;  The Patient           n.     eSIGNED:   John N. Eda Keys at 08/22/2010 03:48 PM           Audie Box, [id number]  Note: An exclamation mark (!) indicates a result that was not dispersed into the flowsheet. Document Creation Date: 08/22/2010 3:49 PM _______________________________________________________________________  (1) Order result status: Final Collection or observation date-time:  08/22/2010 15:42 Requested date-time:  Receipt date-time:  Reported date-time:  Referring Physician:   Ordering Physician: Fransico Setters [id number]) Specimen Source:  Source: Launa Grill Order Number: [id number] Lab site:   Appended Document: Colonoscopy    Clinical Lists Changes  Observations: Added new observation of COLONNXTDUE: 08/2020 (08/22/2010 16:00)

## 2010-11-12 NOTE — Progress Notes (Signed)
Summary: Prep kit too expensive  Phone Note Call from Patient Call back at [phone]   Call For: Dr [name] Summary of Call: Confused why his prep kit is $30 and his wife's prep kit ws only $10? Initial call taken by: Leanor Kail [facility],  August 08, 2010 12:08 PM  Follow-up for Phone Call        pt's questions were answered. Follow-up by: Ezra Sites RN,  August 08, 2010 1:21 PM

## 2010-11-12 NOTE — Procedures (Signed)
Summary: Sanda Klein, MD   Colonoscopy  Procedure date:  08/09/2002  Findings:      Location:  [facility].  Results: Hemorrhoids.       Procedures Next Due Date:    Colonoscopy: 08/2007 Patient Name: William Phillips, William Phillips MRN:  Procedure Procedures: Colonoscopy CPT: 04540.  Personnel: Endoscopist: Ulyess Mort, MD.  Referred By: Barbera Setters Artis Flock, MD.  Exam Location: Exam performed in Outpatient Clinic. Outpatient  Patient Consent: Procedure, Alternatives, Risks and Benefits discussed, consent obtained, from patient. Consent was obtained by the RN.  Indications  Average Risk Screening Routine.  History  Pre-Exam Physical: Performed Aug 09, 2002. Rectal exam, Abdominal exam, Extremity exam, Mental status exam WNL.  Exam Exam: Extent of exam reached: Cecum, extent intended: Cecum.  The cecum was identified by appendiceal orifice and IC valve. Colon retroflexion performed. Images were not taken. ASA Classification: II.  Monitoring: Pulse and BP monitoring, Oximetry used. Supplemental O2 given.  Colon Prep Prep results: good.  Sedation Meds: Patient assessed and found to be appropriate for moderate (conscious) sedation. Fentanyl 125 mcg. given IV. Versed 12 given IV.  Findings - NOT SEEN ON EXAM: Cecum to Rectum. Polyps, AVM's, Colitis, Tumors, Melanosis, Crohn's, Diverticulosis, Hemorrhoids,  - HEMORRHOIDS: ICD9: Hemorrhoids, Unspecified: 455.6. Comments: hem-- tag.   Assessment Normal examination.  Diagnoses: 455.6: Hemorrhoids, Unspecified.   Events  Unplanned Interventions: No intervention was required.  Unplanned Events: There were no complications. Plans Medication Plan: Continue current medications.  Patient Education: Patient given standard instructions for: Hemorrhoids. a normal exam. Yearly hemoccult testing recommended. Patient instructed to get routine colonoscopy every 5 years.  Disposition: After procedure  patient sent to recovery. After recovery patient sent home.   This report was created from the original endoscopy report, which was reviewed and signed by the above listed endoscopist.   cc:  Bradd Canary, MD

## 2010-11-12 NOTE — Progress Notes (Signed)
Summary: previsit letter concern  Phone Note Call from Patient Call back at [phone] (cell)   Caller: Patient Call For: Dr. [name] Reason for Call: Talk to Nurse Summary of Call: previsit letter concern Initial call taken by: Vallarie Mare,  July 22, 2010 8:44 AM  Follow-up for Phone Call        called patient and he stated he had a colonoscopy here by Dr. Corinda Gubler.  I advised him to keep appt. as scheduled.   Follow-up by: Milford Cage NCMA,  July 22, 2010 9:07 AM

## 2010-11-12 NOTE — Letter (Signed)
Summary: [facility]   Imported By: Maryln Gottron 09/16/2010 12:13:58  _____________________________________________________________________  External Attachment:    Type:   Image     Comment:   External Document

## 2010-11-12 NOTE — Assessment & Plan Note (Signed)
Summary: lesion on ankle/hea   Vital Signs:  Patient profile:   60 year old male Height:      72 inches Weight:      189.25 pounds BMI:     25.76 O2 Sat:      98 % on Room air Temp:     98.3 degrees F oral Pulse rate:   68 / minute Pulse rhythm:   regular Resp:     20 per minute BP sitting:   134 / 80  (right arm) Cuff size:   large  Vitals Entered By: Glendell Docker CMA (July 05, 2010 8:54 AM)  O2 Flow:  Room air CC: evaluation of lesion on right ankle Is Patient Diabetic? No Pain Assessment Patient in pain? no      Comments would like to schedule a colonoscopy,  discuss acid reflux directions, flu shot with employer   Primary Care Provider:  D. Thomos Lemons DO  CC:  evaluation of lesion on right ankle.  History of Present Illness: 60 y/o white male c/o new skin lesion on right ankle feels like nodule slightly red non tender  Preventive Screening-Counseling & Management  Alcohol-Tobacco     Smoking Status: never  Allergies: 1)  ! Pcn  Past History:  Past Medical History: Allergic rhinitis Hyperlipidemia    Hypertension    Hx of carpal tunnel syndrome  Ankle fracture 1998   Social History: Smoking Status:  never  Physical Exam  General:  alert, well-developed, and well-nourished.   Lungs:  normal respiratory effort and normal breath sounds.   Heart:  normal rate, regular rhythm, no murmur, and no gallop.   Skin:  8 mm nodular lesion above arch of right foot    Impression & Recommendations:  Problem # 1:  SKIN LESION (ICD-709.9) pt with 8 mm nodular skin lesion on right foot. refer to derm for excision / biopsy  Problem # 2:  HEALTH MAINTENANCE EXAM (ICD-V70.0)  Orders: Gastroenterology Referral (GI)  Complete Medication List: 1)  Quinapril-hydrochlorothiazide 20-12.5 Mg Tabs (Quinapril-hydrochlorothiazide) .... Take 1 tablet by mouth once a day 2)  Amlodipine Besylate 5 Mg Tabs (Amlodipine besylate) .... Take 1 tablet by mouth once  a day 3)  Simvastatin 20 Mg Tabs (Simvastatin) .... Take 1 tab by mouth at bedtime 4)  Baby Aspirin 81 Mg Chew (Aspirin) .... Take 1 tablet by mouth once a day 5)  Multivitamins Tabs (Multiple vitamin) .... Take 1 tablet by mouth once a day 6)  Viagra 100 Mg Tabs (Sildenafil citrate) .... 1/2 to one tab by mouth once daily as needed 7)  Triamcinolone Acetonide 0.1 % Crea (Triamcinolone acetonide) .... Apply two times a day 8)  Omeprazole 20 Mg Cpdr (Omeprazole) .... One by mouth once daily 15 mins before am  Patient Instructions: 1)  Use ranitidine 150 mg by mouth two times a day over the counter instead of omeprazole.  Current Allergies (reviewed today): ! PCN

## 2010-11-12 NOTE — Miscellaneous (Signed)
Summary: Lab Orders  Clinical Lists Changes  Orders: Added new Test order of T-Basic Metabolic Panel [phone]) - Signed Added new Test order of T-Hepatic Function [phone]) - Signed Added new Test order of T-Lipid Profile [phone]) - Signed Added new Test order of T-TSH ([id number]) - Signed Added new Test order of T-CBC w/Diff ([id number]) - Signed

## 2010-11-14 NOTE — Progress Notes (Signed)
Summary: Omeprazole Refill  Phone Note Refill Request Message from:  [facility] [address] on October 25, 2010 2:20 PM  Refills Requested: Medication #1:  OMEPRAZOLE 20 MG CPDR one by mouth once daily 15 mins before AM.   Dosage confirmed as above?Dosage Confirmed   Supply Requested: 3 months   Last Refilled: 03/27/2011 Pt last seen 03/2010.  Next Appointment Scheduled: None Initial call taken by: Mervin Kung CMA Duncan Dull),  October 25, 2010 2:21 PM  Follow-up for Phone Call        ok to refill - 3 months supply with one refill Follow-up by: D. Thomos Lemons DO,  October 25, 2010 5:15 PM  Additional Follow-up for Phone Call Additional follow up Details #1::        Rx filled electronically via refill request Additional Follow-up by: Glendell Docker CMA,  October 28, 2010 8:20 AM

## 2011-02-25 NOTE — Op Note (Signed)
NAMESABAN, HEINLEN NO.:  [id number]   MEDICAL RECORD NO.:  [id number]          PATIENT TYPE:  AMB   LOCATION:  DAY                          FACILITY:  [facility]   PHYSICIAN:  Ardeth Sportsman, MD     DATE OF BIRTH:  [birth year]   DATE OF PROCEDURE:  08/19/2007  DATE OF DISCHARGE:                               OPERATIVE REPORT   PRIMARY CARE PHYSICIAN:  Quita Skye. Artis Flock, M.D.   PREOPERATIVE DIAGNOSIS:  Bilateral inguinal hernia.   POSTOPERATIVE DIAGNOSIS:  Bilateral indirect inguinal hernia.   OPERATION PERFORMED:  Laparoscopic bilateral inguinal hernia repair.   SURGEON:  Ardeth Sportsman, MD   ASSISTANT:  None.   SPECIMENS:  None.   DRAINS:  None.   ESTIMATED BLOOD LOSS:  30 mL.   COMPLICATIONS:  No major complications.   ANESTHESIA:  1. General anesthesia.  2. Local anesthetic in a field block at all port sites as well as      bilateral ilioinguinal/genitofemoral/cord nerve blocks.   INDICATIONS FOR PROCEDURE:  Mr. 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  It has become  increasingly bothersome and he wished to have them addressed.  A  surgical consultation was made.  The anatomy and physiology of abdominal  wall formation and embryology of testicular migration was discussed.  Pathophysiology of inguinal herniation was explained with its risks of  incarceration and strangulation.  Risks such as stroke, heart attack,  deep venous thrombosis, pulmonary embolisms and death were discussed.  Risks such as bleeding, need for transfusion, hematoma, ecchymosis,  wound infection, abscess, injury to other organs, prolonged pain,  recurrent hernia, urinary retention requiring catheterization,  testicular injury and/or loss and other risks were discussed.  Questions  answered and he agreed to proceed.   OPERATIVE FINDINGS:  He had  bilateral indirect inguinal hernia.  The  right side appeared to be greater than the left but the left had a  moderate sized cord lipoma within it as well.   DESCRIPTION OF PROCEDURE:  Informed consent was confirmed.  The patient  underwent general anesthesia without any difficulty.  He received IV  clindamycin, given his penicillin allergy.  He has sequential  compression devices active during the entire case.  He had Foley  catheter sterilely placed.  His abdomen was clipped, prepped and draped  in sterile fashion.   Entry was gained in the abdomen through an infraumbilical curvilinear  incision.  A nick was made in the anterior rectus fascia and a 12 mm  Hasson port was passed just posterior to the left rectus abdominis  muscle.  Capnopreperitoneum to 15 mmHg provided good abdominal wall  insufflation.  Peritoneum was freed off the anterior abdominal wall such  that enough working space could be created so that 5 mm ports could be  passed in the left mid abdomen and right mid abdomen.  During dissection  there was the right rectus abdominis muscle did fall down after  dissection in the  plane.  A 2-0 Prolene stitch on a Keith needle was  placed through the anterior abdominal wall and used as a sling to help  hold the right rectus abdominis muscle more anteriorly.  There was some  nick of a branch off the inferior epigastric vessel but after pressure  was held for 10 minutes. There was excellent hemostasis.  This was where  most of the blood loss occurred.   Attention was turned towards the right side.  Peritoneum was freed off  laterally off the side wall.  Peritoneum was freed down to the pelvic  brim and a window was made between the anterolateral bladder and  anterior lower pelvic wall down to the obturator foramen.  The  peritoneum could be seen crawling up with the cord structures consistent  with an indirect inguinal hernia into the internal ring.  The peritoneal  hernia sac  was freed off the cord structures and peeled back as  proximally as possible.  There was a small nick made in the peritoneum  and this was closed using 3-0 Vicryl stitch using laparoscopic suturing.   Attention was turned to dissection on the left side.  He had a small  indirect hernia defect on the left side but he did have a cord lipoma  noticed with it as well. The cord lipoma was able to be reduced and  carefully skeletonized off the cord structures to keep them attached.  Again peritoneum was freed off in a mirror image fashion on the left  side.   A 15 x 15 cm light weight polypropylene (Ultrapro) mesh was used for  each side.  They were placed in mirror image fashion such that a medial  and inferior flap rested in the true pelvis between the anterolateral  bladder and the pelvic side wall.  Mesh came over the edge over the  psoas muscle posteriorly and rested along the lateral side wall as well  as superiorly and medially such that there was at least three inches of  circumferential coverage of mesh around the internal inguinal ring.  Lead points of hernia sacs were grasped on both sides and they were  elevated cephalad as the capnopreperitoneum was evacuated.  Ports were  removed.  The fascial defect was closed using 0 Vicryl stitch  infraumbilically.  Skin was closed using 4-0 Monocryl stitch.  Sterile  dressing was applied.  The patient was extubated and sent to recovery  room in stable condition.   I explained the operative findings to the patient's wife and  postoperative instructions were discussed with her and her family.  I  discussed them with the patient just prior to surgery about expected  postoperative recovery.  Questions were answered to their satisfaction  and they expressed understanding and appreciation.      Ardeth Sportsman, MD  Electronically Signed     SCG/MEDQ  D:  08/19/2007  T:  08/19/2007  Job:  [id number]   cc:   Quita Skye. Artis Flock, M.D.  Fax:  [phone]

## 2011-02-27 IMAGING — CR DG CHEST 2V
2 series · 2 of 2 positions shown · non-contrast
Comparison: 09/07/2006

CLINICAL DATA: Fever, chills

CHEST - 2 VIEW

[w chest pa]
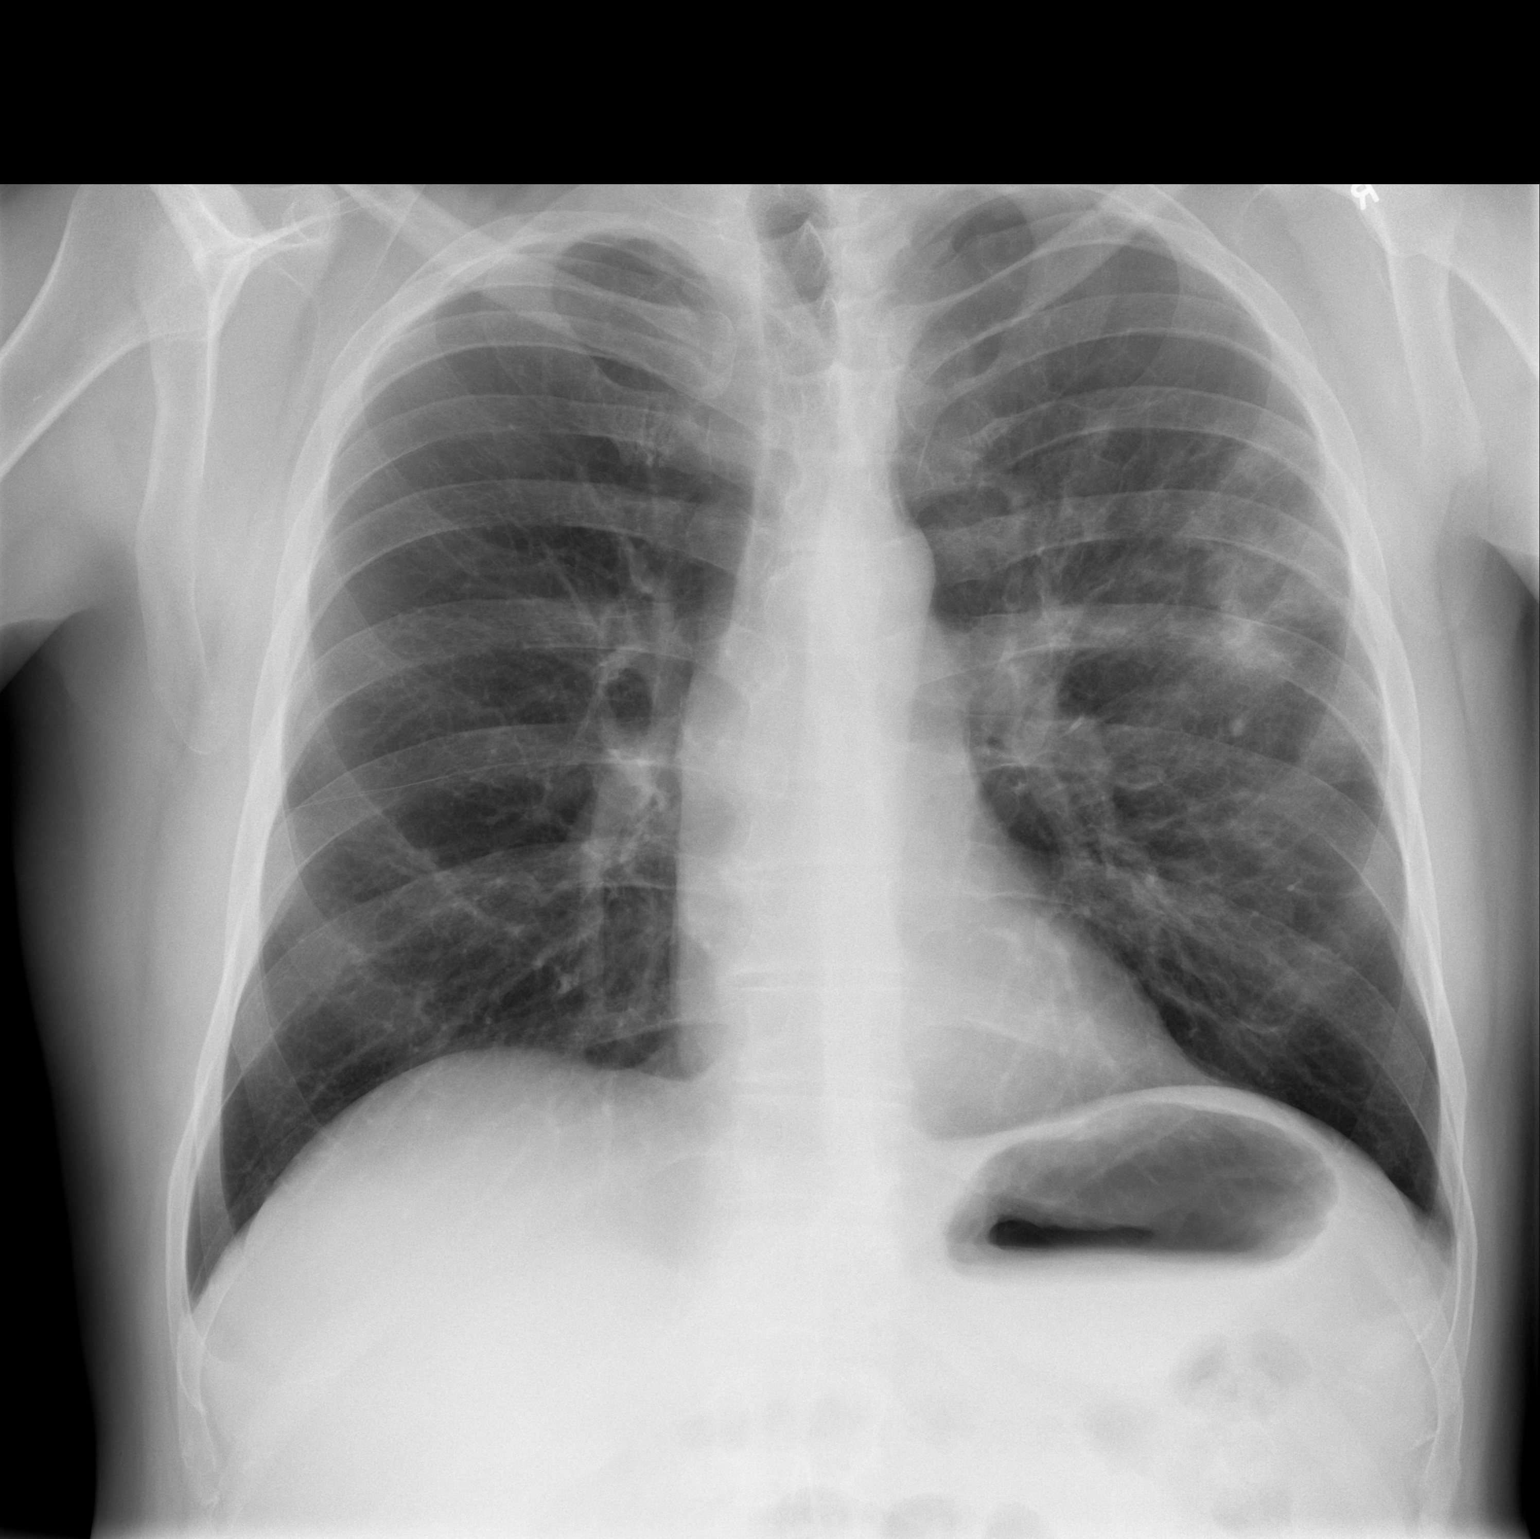

[w chest lat]
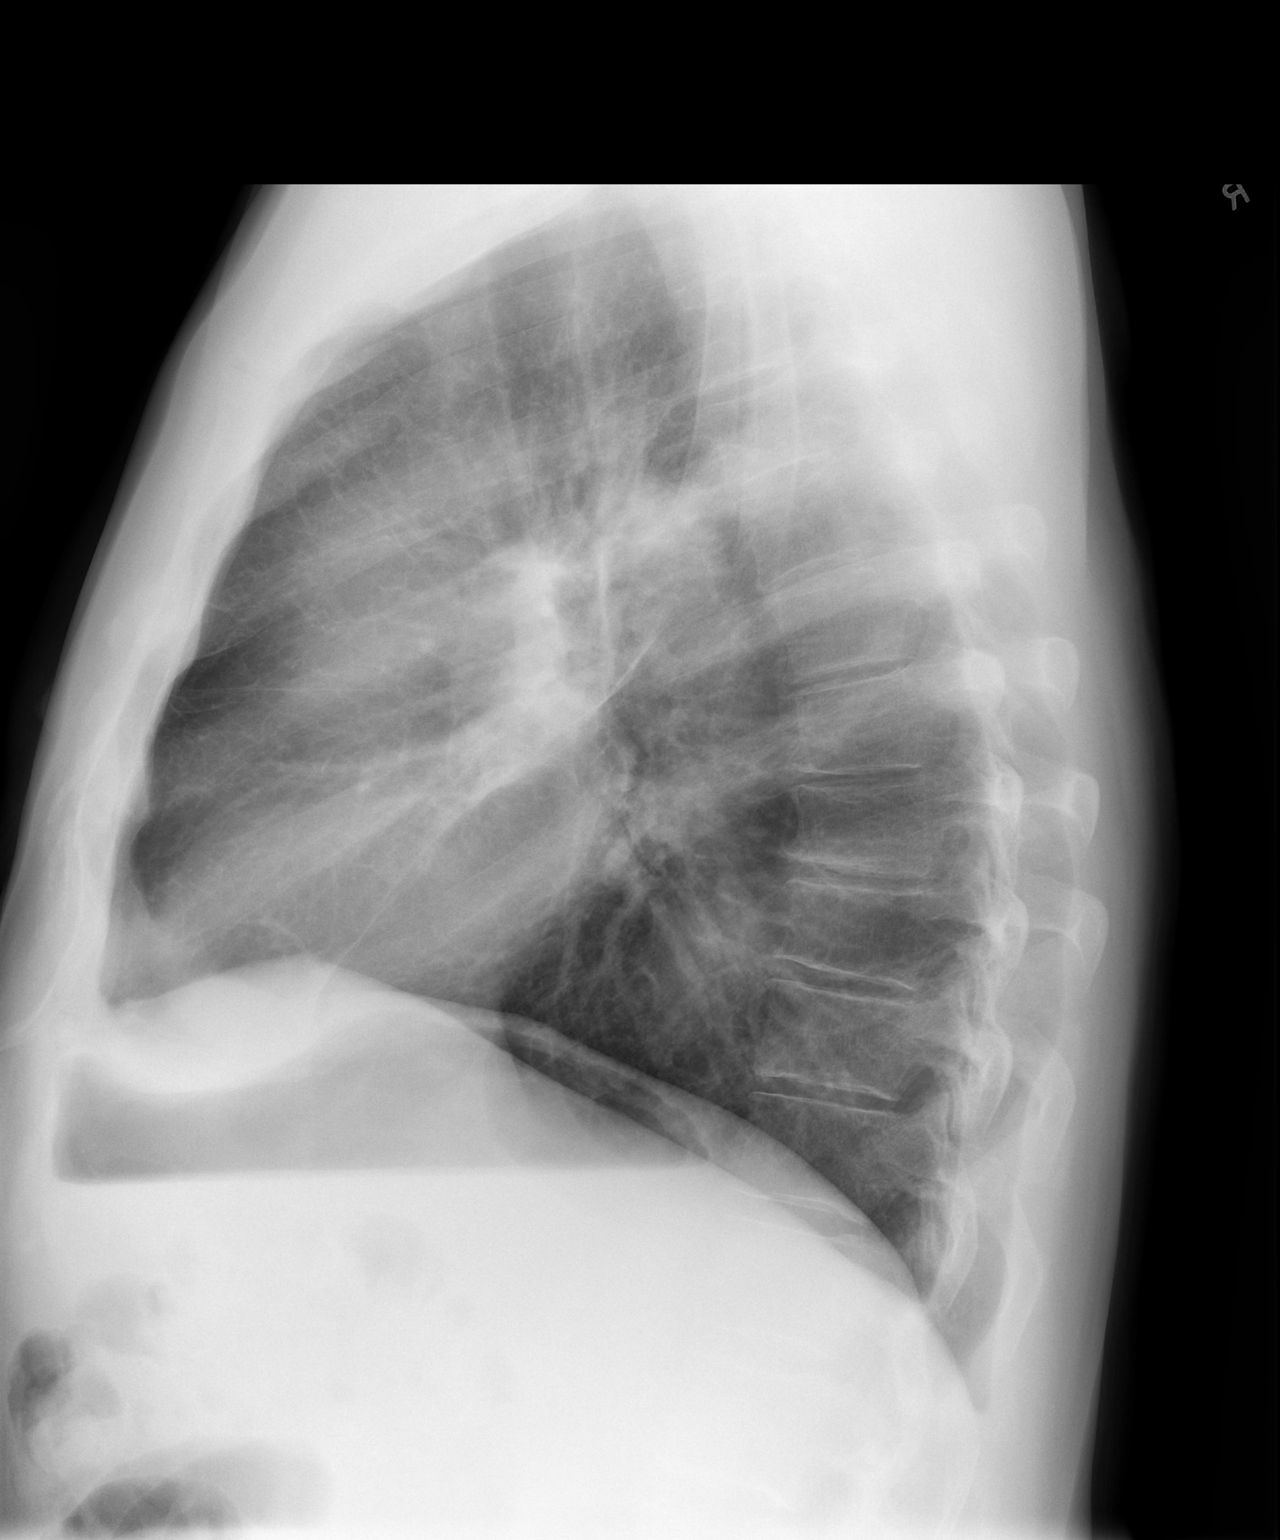

[2 of 2 positions shown; findings below may reference images not displayed]

FINDINGS: Cardiomediastinal silhouette is stable. There is patchy
airspace disease probable infiltrate/pneumonia  in left upper lobe.
No pleural effusion.  Bony thorax is stable.
IMPRESSION: Patchy airspace disease probable pneumonia in the left upper lobe.
Follow-up to resolution after appropriate treatment is recommended.

## 2011-04-15 ENCOUNTER — Encounter: Payer: Self-pay | Admitting: Internal Medicine

## 2011-04-18 ENCOUNTER — Other Ambulatory Visit: Payer: Self-pay | Admitting: Internal Medicine

## 2011-04-18 DIAGNOSIS — E785 Hyperlipidemia, unspecified: Secondary | ICD-10-CM

## 2011-04-18 DIAGNOSIS — I10 Essential (primary) hypertension: Secondary | ICD-10-CM

## 2011-04-18 DIAGNOSIS — Z0000 Encounter for general adult medical examination without abnormal findings: Secondary | ICD-10-CM

## 2011-04-18 LAB — CBC WITH DIFFERENTIAL/PLATELET
Basophils Absolute: 0 10*3/uL (ref 0.0–0.1)
Basophils Relative: 1 % (ref 0–1)
Eosinophils Absolute: 0.4 10*3/uL (ref 0.0–0.7)
Eosinophils Relative: 6 % — ABNORMAL HIGH (ref 0–5)
Hemoglobin: 15.9 g/dL (ref 13.0–17.0)
Monocytes Relative: 9 % (ref 3–12)
Neutrophils Relative %: 51 % (ref 43–77)
Platelets: 188 10*3/uL (ref 150–400)
RDW: 13.8 % (ref 11.5–15.5)

## 2011-04-18 LAB — HEPATIC FUNCTION PANEL
ALT: 21 U/L (ref 0–53)
Albumin: 4.6 g/dL (ref 3.5–5.2)
Alkaline Phosphatase: 46 U/L (ref 39–117)
Bilirubin, Direct: 0.1 mg/dL (ref 0.0–0.3)
Indirect Bilirubin: 0.5 mg/dL (ref 0.0–0.9)
Total Bilirubin: 0.6 mg/dL (ref 0.3–1.2)
Total Protein: 7 g/dL (ref 6.0–8.3)

## 2011-04-18 LAB — LIPID PANEL
LDL Cholesterol: 104 mg/dL — ABNORMAL HIGH (ref 0–99)
Triglycerides: 88 mg/dL (ref <150)
VLDL: 18 mg/dL (ref 0–40)

## 2011-04-18 LAB — BASIC METABOLIC PANEL
Chloride: 101 mEq/L (ref 96–112)
Potassium: 4.7 mEq/L (ref 3.5–5.3)
Sodium: 139 mEq/L (ref 135–145)

## 2011-04-24 ENCOUNTER — Encounter: Payer: Self-pay | Admitting: Internal Medicine

## 2011-04-24 ENCOUNTER — Ambulatory Visit (INDEPENDENT_AMBULATORY_CARE_PROVIDER_SITE_OTHER): Payer: BC Managed Care – PPO | Admitting: Internal Medicine

## 2011-04-24 VITALS — BP 118/80 | HR 78 | Temp 98.1°F | Resp 16 | Ht 72.01 in | Wt 195.1 lb

## 2011-04-24 DIAGNOSIS — Z0000 Encounter for general adult medical examination without abnormal findings: Secondary | ICD-10-CM

## 2011-04-24 MED ORDER — SILDENAFIL CITRATE 100 MG PO TABS: ORAL_TABLET | ORAL | Status: DC

## 2011-04-24 MED ORDER — AMLODIPINE BESYLATE 5 MG PO TABS: 5.0000 mg | ORAL_TABLET | Freq: Every day | ORAL | Status: DC

## 2011-04-24 MED ORDER — OMEPRAZOLE 20 MG PO CPDR: 20.0000 mg | DELAYED_RELEASE_CAPSULE | Freq: Every day | ORAL | Status: DC

## 2011-04-24 MED ORDER — SIMVASTATIN 20 MG PO TABS: 20.0000 mg | ORAL_TABLET | Freq: Every day | ORAL | Status: DC

## 2011-04-24 NOTE — Patient Instructions (Signed)
Please schedule chem7 (v58.69), lipid/lft (272.4) prior to next appt

## 2011-04-27 DIAGNOSIS — Z0000 Encounter for general adult medical examination without abnormal findings: Secondary | ICD-10-CM

## 2011-04-27 DIAGNOSIS — Z7184 Encounter for health counseling related to travel: Secondary | ICD-10-CM | POA: Insufficient documentation

## 2011-04-27 HISTORY — DX: Encounter for general adult medical examination without abnormal findings: Z00.00

## 2011-04-27 NOTE — Progress Notes (Signed)
  Subjective:    Patient ID: William Phillips, male    DOB: 1951-03-19, 60 y.o.   MRN: [id number]  HPI patient is a sequelae for complete physical exam. Has history of hypertension and blood pressure is under good control. Tolerates medication without adverse effect. Tolerates his statin therapy without myalgias or abnormal LFTs. Interested in obtaining PSA for prostate cancer screening. No other complaints.  Reviewed past medical history, medications and allergies    Review of Systems  Cardiovascular: Negative for leg swelling.  Musculoskeletal: Negative for myalgias.  All other systems reviewed and are negative.       Objective:   Physical Exam  Physical Exam  Vitals reviewed. Constitutional:  appears well-developed and well-nourished. No distress.  HENT:  Head: Normocephalic and atraumatic.  Right Ear: Tympanic membrane, external ear and ear canal normal.  Left Ear: Tympanic membrane, external ear and ear canal normal.  Nose: Nose normal.  Mouth/Throat: Oropharynx is clear and moist. No oropharyngeal exudate.  Eyes: Conjunctivae and EOM are normal. Pupils are equal, round, and reactive to light. Right eye exhibits no discharge. Left eye exhibits no discharge. No scleral icterus.  Neck: Neck supple. No thyromegaly present. No carotid bruits Cardiovascular: Normal rate, regular rhythm and normal heart sounds.  Exam reveals no gallop and no friction rub.   No murmur heard. Pulmonary/Chest: Effort normal and breath sounds normal. No respiratory distress.  has no wheezes.  has no rales.  Abdomen: Soft, nondistended nontender to palpation positive bowel sounds. No masses or organomegaly. DRE: good sphincter tone soft brown stool in the vault without gross blood prostate soft nontender without nodularity. Heme-negative Lymphadenopathy:   no cervical adenopathy.  Neurological:  is alert.  Skin: Skin is warm and dry.  not diaphoretic.  Psychiatric: normal mood and affect.          Assessment & Plan:

## 2011-04-27 NOTE — Assessment & Plan Note (Signed)
Normal exam. Reviewed screening labs. Discussed pros and cons of PSA and patient wishes to obtain. EKG obtained demonstrates normal sinus rhythm with a rate of 63. Intervals and axis normal. No arrhythmia or evidence of obvious ischemic change noted.

## 2011-04-28 ENCOUNTER — Telehealth: Payer: Self-pay | Admitting: Internal Medicine

## 2011-04-28 DIAGNOSIS — I10 Essential (primary) hypertension: Secondary | ICD-10-CM

## 2011-04-28 NOTE — Telephone Encounter (Signed)
Call from pt  Need refill   Quinapril   [facility]  20-12.5   To Cyndie Mull

## 2011-04-29 MED ORDER — QUINAPRIL-HYDROCHLOROTHIAZIDE 20-12.5 MG PO TABS: 1.0000 | ORAL_TABLET | Freq: Every day | ORAL | Status: DC

## 2011-04-29 NOTE — Telephone Encounter (Signed)
Pt seen on 04/24/11.  Due for follow up 10/20/11.  RX sent.

## 2011-05-01 MED ORDER — QUINAPRIL-HYDROCHLOROTHIAZIDE 20-12.5 MG PO TABS: 1.0000 | ORAL_TABLET | Freq: Every day | ORAL | Status: DC

## 2011-05-01 NOTE — Telephone Encounter (Signed)
Addended by: Mervin Kung A on: 05/01/2011 11:09 AM   Modules accepted: Orders

## 2011-05-01 NOTE — Telephone Encounter (Signed)
Received call from pt stating he needed a 90 day supply of Quinapril sent to pharmacy and we only sent 30 day supply. Spoke to pharmacist and resent Rx for  90 day supply.  Left message on pt's voicemail that he may pick up the remaining 60 day supply for additional copay per pharmacist and they will cancel previous 30 day Rx. Left detailed message on pt's voicemail.

## 2011-05-05 ENCOUNTER — Telehealth: Payer: Self-pay | Admitting: Internal Medicine

## 2011-05-05 MED ORDER — QUINAPRIL-HYDROCHLOROTHIAZIDE 20-12.5 MG PO TABS: 1.0000 | ORAL_TABLET | Freq: Every day | ORAL | Status: DC

## 2011-05-05 NOTE — Telephone Encounter (Signed)
Patient states that he went to pick up 60 day supply of quinapril over the weekend and a 90 day supply had been called in but that pharmacy could not fill 90 day supply. Patient needs 60 day supply sent in.

## 2011-05-05 NOTE — Telephone Encounter (Signed)
60 day supply sent to pharmacy with note to place 90 day supply rx on file for future refills.

## 2011-07-22 LAB — HEMOGLOBIN AND HEMATOCRIT, BLOOD: Hemoglobin: 15.5

## 2011-07-22 LAB — BASIC METABOLIC PANEL
BUN: 13
CO2: 26
Chloride: 100
GFR calc Af Amer: >60
Glucose, Bld: 120 — ABNORMAL HIGH

## 2011-07-31 ENCOUNTER — Encounter: Payer: Self-pay | Admitting: Internal Medicine

## 2011-07-31 ENCOUNTER — Ambulatory Visit (INDEPENDENT_AMBULATORY_CARE_PROVIDER_SITE_OTHER): Payer: BC Managed Care – PPO | Admitting: Internal Medicine

## 2011-07-31 VITALS — BP 126/80 | HR 66 | Temp 97.8°F | Resp 16 | Wt 189.0 lb

## 2011-07-31 DIAGNOSIS — J069 Acute upper respiratory infection, unspecified: Secondary | ICD-10-CM | POA: Insufficient documentation

## 2011-07-31 DIAGNOSIS — J029 Acute pharyngitis, unspecified: Secondary | ICD-10-CM

## 2011-07-31 LAB — POCT RAPID STREP A (OFFICE): Rapid Strep A Screen: NEGATIVE

## 2011-07-31 MED ORDER — DOXYCYCLINE HYCLATE 100 MG PO TABS: 100.0000 mg | ORAL_TABLET | Freq: Two times a day (BID) | ORAL | Status: AC

## 2011-07-31 NOTE — Progress Notes (Signed)
  Subjective:    Patient ID: William Phillips, male    DOB: 1951/02/01, 60 y.o.   MRN: [id number]  HPI Pt presents to clinic for evaluation of ST and cough. Notes ~7-8 day h/o ST and now several day h/o NP cough and chest congestion. No known sick exposure. Taking otc cough medication prn. Denies fever, chills, or dyspnea. No alleviating or exacerbating factors. No other complaints.  Past Medical History  Diagnosis Date  . Allergic rhinitis   . Hyperlipidemia   . Hypertension   . History of carpal tunnel syndrome   . Ankle fracture 1998   Past Surgical History  Procedure Date  . Inguinal hernia repair 2008  . Colonoscopy 08/04/2002    Normal Exam- Dr [name]    reports that he has quit smoking. He does not have any smokeless tobacco history on file. He reports that he drinks alcohol. His drug history not on file. family history includes Coronary artery disease in his father; Diabetes type II in his maternal grandfather; Heart attack (age of onset:77) in his father; Hyperlipidemia in an unspecified family member; and Hypertension in an unspecified family member. Allergies  Allergen Reactions  . Penicillins     REACTION: swelling , irriation       Review of Systems see hpi     Objective:   Physical Exam  Nursing note and vitals reviewed. Constitutional: He appears well-developed and well-nourished.  HENT:  Head: Normocephalic and atraumatic.  Right Ear: Tympanic membrane, external ear and ear canal normal.  Left Ear: Tympanic membrane, external ear and ear canal normal.  Nose: Nose normal.  Mouth/Throat: Mucous membranes are normal. Posterior oropharyngeal erythema present. No oropharyngeal exudate, posterior oropharyngeal edema or tonsillar abscesses.  Eyes: Conjunctivae are normal. No scleral icterus.  Neck: Neck supple.  Cardiovascular: Normal rate, regular rhythm and normal heart sounds.   Pulmonary/Chest: Effort normal and breath sounds normal. No respiratory distress.  He has no wheezes. He has no rales.  Lymphadenopathy:    He has no cervical adenopathy.  Neurological: He is alert.  Skin: Skin is warm and dry.  Psychiatric: He has a normal mood and affect.          Assessment & Plan:

## 2011-07-31 NOTE — Assessment & Plan Note (Signed)
Given abx prescription. Begin abx if sx's do not improve over next 24-36 hours. Followup if no improvement or worsening.

## 2011-10-20 ENCOUNTER — Ambulatory Visit (INDEPENDENT_AMBULATORY_CARE_PROVIDER_SITE_OTHER): Payer: BC Managed Care – PPO | Admitting: Internal Medicine

## 2011-10-20 ENCOUNTER — Encounter: Payer: Self-pay | Admitting: Internal Medicine

## 2011-10-20 ENCOUNTER — Ambulatory Visit: Payer: BC Managed Care – PPO | Admitting: Internal Medicine

## 2011-10-20 DIAGNOSIS — E785 Hyperlipidemia, unspecified: Secondary | ICD-10-CM

## 2011-10-20 DIAGNOSIS — M79609 Pain in unspecified limb: Secondary | ICD-10-CM

## 2011-10-20 DIAGNOSIS — I10 Essential (primary) hypertension: Secondary | ICD-10-CM

## 2011-10-20 DIAGNOSIS — M79671 Pain in right foot: Secondary | ICD-10-CM

## 2011-10-20 DIAGNOSIS — R739 Hyperglycemia, unspecified: Secondary | ICD-10-CM

## 2011-10-20 DIAGNOSIS — R7309 Other abnormal glucose: Secondary | ICD-10-CM

## 2011-10-20 LAB — HEPATIC FUNCTION PANEL
ALT: 21 U/L (ref 0–53)
AST: 18 U/L (ref 0–37)
Bilirubin, Direct: 0.1 mg/dL (ref 0.0–0.3)
Indirect Bilirubin: 0.6 mg/dL (ref 0.0–0.9)
Total Protein: 7.5 g/dL (ref 6.0–8.3)

## 2011-10-20 LAB — BASIC METABOLIC PANEL
Chloride: 99 mEq/L (ref 96–112)
Glucose, Bld: 118 mg/dL — ABNORMAL HIGH (ref 70–99)
Potassium: 4.5 mEq/L (ref 3.5–5.3)
Sodium: 139 mEq/L (ref 135–145)

## 2011-10-20 LAB — LIPID PANEL
Cholesterol: 207 mg/dL — ABNORMAL HIGH (ref 0–200)
Total CHOL/HDL Ratio: 3.2 Ratio
Triglycerides: 249 mg/dL — ABNORMAL HIGH (ref <150)

## 2011-10-20 LAB — HEMOGLOBIN A1C: Mean Plasma Glucose: 126 mg/dL — ABNORMAL HIGH (ref <117)

## 2011-10-20 MED ORDER — DICLOFENAC SODIUM 75 MG PO TBEC: DELAYED_RELEASE_TABLET | ORAL | Status: DC

## 2011-10-20 NOTE — Progress Notes (Signed)
  Subjective:    Patient ID: William Phillips, male    DOB: 01-Apr-1951, 60 y.o.   MRN: [id number]  HPI Pt presents to clinic for followup of multiple medical problems. Tolerates statin tx without myalgias or abn lft. Notes several week h/o right foot pain located plantar ball of foot. Pain worsens during the day. Denies injury, swelling of foot/toes or redness/warmth. Attempted otc medication without significant improvement. No other alleviating or exacerbating factors. BP reviewed normotensive. No other complaints.  Past Medical History  Diagnosis Date  . Allergic rhinitis   . Hyperlipidemia   . Hypertension   . History of carpal tunnel syndrome   . Ankle fracture 1998   Past Surgical History  Procedure Date  . Inguinal hernia repair 2008  . Colonoscopy 08/04/2002    Normal Exam- Dr [name]    reports that he has quit smoking. He does not have any smokeless tobacco history on file. He reports that he drinks alcohol. His drug history not on file. family history includes Coronary artery disease in his father; Diabetes type II in his maternal grandfather; Heart attack (age of onset:77) in his father; Hyperlipidemia in an unspecified family member; and Hypertension in an unspecified family member. Allergies  Allergen Reactions  . Penicillins     REACTION: swelling , irriation      Review of Systems see hpi     Objective:   Physical Exam  Physical Exam  Nursing note and vitals reviewed. Constitutional: Appears well-developed and well-nourished. No distress.  HENT:  Head: Normocephalic and atraumatic.  Right Ear: External ear normal.  Left Ear: External ear normal.  Eyes: Conjunctivae are normal. No scleral icterus.  Neck: Neck supple. Carotid bruit is not present.  Cardiovascular: Normal rate, regular rhythm and normal heart sounds.  Exam reveals no gallop and no friction rub.   No murmur heard. Pulmonary/Chest: Effort normal and breath sounds normal. No respiratory distress.  He has no wheezes. no rales.  Lymphadenopathy:    He has no cervical adenopathy.  Neurological:Alert.  Skin: Skin is warm and dry. Not diaphoretic.  Psychiatric: Has a normal mood and affect.  MSK: right foot NT without bony abn. FROM. Able to wt bear and ambulate without difficultly. No toe erythema, warmth or effusion.       Assessment & Plan:

## 2011-10-20 NOTE — Patient Instructions (Signed)
Please schedule fasting labs before your next physical Cbc, chem7, a1c, lipid, lft, tsh, ua and psa (v70.0)

## 2011-10-24 DIAGNOSIS — E119 Type 2 diabetes mellitus without complications: Secondary | ICD-10-CM | POA: Insufficient documentation

## 2011-10-24 DIAGNOSIS — M79671 Pain in right foot: Secondary | ICD-10-CM | POA: Insufficient documentation

## 2011-10-24 DIAGNOSIS — R739 Hyperglycemia, unspecified: Secondary | ICD-10-CM | POA: Insufficient documentation

## 2011-10-24 NOTE — Assessment & Plan Note (Signed)
Attempt voltaren with food and no other nsaids. Followup if no improvement or worsening. 

## 2011-10-24 NOTE — Assessment & Plan Note (Signed)
Obtain lipid/lft. Maintain statin tx. 

## 2011-10-24 NOTE — Assessment & Plan Note (Signed)
Obtain chem7, a1c 

## 2011-10-24 NOTE — Assessment & Plan Note (Signed)
Normotensive and stable. Continue current regimen. Monitor bp as outpt and followup in clinic as scheduled. Obtain chem7 

## 2011-11-18 ENCOUNTER — Telehealth: Payer: Self-pay | Admitting: Internal Medicine

## 2011-11-18 NOTE — Telephone Encounter (Signed)
Left message for pt to return my call. Need to know if pt has had improvement of symptoms, are they the same or worse? If no better, will need office visit per 10/20/11 office note.

## 2011-11-18 NOTE — Telephone Encounter (Signed)
He can try motrin for now, but will need follow up in office please per Dr. Ilda Foil office note.

## 2011-11-18 NOTE — Telephone Encounter (Signed)
[facility] qty 30 diclofenac 75mg 

## 2011-11-18 NOTE — Telephone Encounter (Signed)
Pt states his foot pain is better but still occurring.  Pt is requesting refill of Diclofenac. Please advise.

## 2011-11-19 NOTE — Telephone Encounter (Signed)
Notified pt and scheduled follow up for 11/28/11 at 9:45 with Dr Rodena Medin.

## 2011-11-28 ENCOUNTER — Ambulatory Visit (HOSPITAL_BASED_OUTPATIENT_CLINIC_OR_DEPARTMENT_OTHER)
Admission: RE | Admit: 2011-11-28 | Discharge: 2011-11-28 | Disposition: A | Discharge status: Home / Self Care | Payer: BC Managed Care – PPO | Source: Ambulatory Visit | Attending: Internal Medicine | Admitting: Internal Medicine

## 2011-11-28 ENCOUNTER — Encounter: Payer: Self-pay | Admitting: Internal Medicine

## 2011-11-28 ENCOUNTER — Ambulatory Visit (INDEPENDENT_AMBULATORY_CARE_PROVIDER_SITE_OTHER): Payer: BC Managed Care – PPO | Admitting: Internal Medicine

## 2011-11-28 VITALS — BP 96/72 | HR 89 | Temp 98.2°F | Resp 16 | Ht 72.0 in | Wt 192.0 lb

## 2011-11-28 DIAGNOSIS — M79673 Pain in unspecified foot: Secondary | ICD-10-CM

## 2011-11-28 DIAGNOSIS — M79671 Pain in right foot: Secondary | ICD-10-CM

## 2011-11-28 DIAGNOSIS — M79609 Pain in unspecified limb: Secondary | ICD-10-CM

## 2011-11-28 MED ORDER — DICLOFENAC SODIUM 75 MG PO TBEC: DELAYED_RELEASE_TABLET | ORAL | Status: AC

## 2011-11-28 NOTE — Progress Notes (Signed)
  Subjective:    Patient ID: William Phillips, male    DOB: April 15, 1951, 61 y.o.   MRN: [id number]  HPI Pt presents to clinic for follow up of foot pain. Notes 50% improvement of right plantar foot pain (ball of foot) with voltaren but ran out of medication. Tolerated nsaid without gi adverse effect. No injury/trauma. Does not impair walking. No other alleviating or exacerbating factors. No other complaints.  Past Medical History  Diagnosis Date  . Allergic rhinitis   . Hyperlipidemia   . Hypertension   . History of carpal tunnel syndrome   . Ankle fracture 1998   Past Surgical History  Procedure Date  . Inguinal hernia repair 2008  . Colonoscopy 08/04/2002    Normal Exam- Dr [name]    reports that he has quit smoking. He has never used smokeless tobacco. He reports that he drinks alcohol. His drug history not on file. family history includes Coronary artery disease in his father; Diabetes type II in his maternal grandfather; Heart attack (age of onset:77) in his father; Hyperlipidemia in an unspecified family member; and Hypertension in an unspecified family member. Allergies  Allergen Reactions  . Penicillins     REACTION: swelling , irriation     Review of Systems see hpi     Objective:   Physical Exam  Nursing note and vitals reviewed. Constitutional: He appears well-developed and well-nourished. No distress.  Musculoskeletal:       Right foot- no erythema, warmth or effusion. NT. FROM. Able to wt bear and ambulate without assistance or difficulty  Neurological: He is alert.  Skin: He is not diaphoretic.  Psychiatric: He has a normal mood and affect.          Assessment & Plan:

## 2011-12-07 NOTE — Assessment & Plan Note (Signed)
Obtain plain xray of right foot. Repeat voltaren course. Consider referral if pain persists.

## 2011-12-22 ENCOUNTER — Telehealth: Payer: Self-pay | Admitting: Internal Medicine

## 2011-12-22 ENCOUNTER — Other Ambulatory Visit: Payer: Self-pay | Admitting: Internal Medicine

## 2011-12-22 DIAGNOSIS — M79673 Pain in unspecified foot: Secondary | ICD-10-CM

## 2011-12-22 NOTE — Telephone Encounter (Signed)
Patient seen in February for foot pain, not any better want referral to Podiatry

## 2012-01-19 ENCOUNTER — Telehealth: Payer: Self-pay | Admitting: Internal Medicine

## 2012-01-19 MED ORDER — SIMVASTATIN 20 MG PO TABS: 20.0000 mg | ORAL_TABLET | Freq: Every day | ORAL | Status: DC

## 2012-01-19 MED ORDER — AMLODIPINE BESYLATE 5 MG PO TABS: 5.0000 mg | ORAL_TABLET | Freq: Every day | ORAL | Status: DC

## 2012-01-19 NOTE — Telephone Encounter (Signed)
Also refill amlodipine 5 mg tab qty 90

## 2012-01-19 NOTE — Telephone Encounter (Signed)
Refill simvastatin 20 mg qty 90

## 2012-01-19 NOTE — Telephone Encounter (Signed)
Rx refill sent to pharmacy. 

## 2012-01-26 ENCOUNTER — Telehealth: Payer: Self-pay | Admitting: Internal Medicine

## 2012-01-26 MED ORDER — QUINAPRIL-HYDROCHLOROTHIAZIDE 20-12.5 MG PO TABS: 1.0000 | ORAL_TABLET | Freq: Every day | ORAL | Status: DC

## 2012-01-26 NOTE — Telephone Encounter (Signed)
Refill- quinapril/hctz 20-12.5tab. Take one tablet by mouth every day. Qty 90 last fill 1.12.13

## 2012-01-26 NOTE — Telephone Encounter (Signed)
Rx refill sent to pharmacy. 

## 2012-02-02 ENCOUNTER — Telehealth: Payer: Self-pay | Admitting: Internal Medicine

## 2012-02-02 MED ORDER — OMEPRAZOLE 20 MG PO CPDR: 20.0000 mg | DELAYED_RELEASE_CAPSULE | Freq: Every day | ORAL | Status: DC

## 2012-02-02 NOTE — Telephone Encounter (Signed)
Rx refill sent to pharmacy. 

## 2012-02-02 NOTE — Telephone Encounter (Signed)
Refill-omeprazole 20mg  cap. Take one capsule by mouth every day 15 minutes before morning meal as needed. Qty 90 last fill 1.12.13

## 2012-04-01 ENCOUNTER — Other Ambulatory Visit: Payer: Self-pay | Admitting: Internal Medicine

## 2012-04-01 DIAGNOSIS — Z0000 Encounter for general adult medical examination without abnormal findings: Secondary | ICD-10-CM

## 2012-04-01 DIAGNOSIS — R739 Hyperglycemia, unspecified: Secondary | ICD-10-CM

## 2012-04-01 DIAGNOSIS — Z125 Encounter for screening for malignant neoplasm of prostate: Secondary | ICD-10-CM

## 2012-04-01 DIAGNOSIS — I10 Essential (primary) hypertension: Secondary | ICD-10-CM

## 2012-04-01 DIAGNOSIS — E785 Hyperlipidemia, unspecified: Secondary | ICD-10-CM

## 2012-04-01 LAB — HEPATIC FUNCTION PANEL
ALT: 18 U/L (ref 0–53)
AST: 19 U/L (ref 0–37)
Albumin: 4.6 g/dL (ref 3.5–5.2)
Total Bilirubin: 1 mg/dL (ref 0.3–1.2)

## 2012-04-01 LAB — LIPID PANEL
Cholesterol: 178 mg/dL (ref 0–200)
HDL: 63 mg/dL (ref >39)
Total CHOL/HDL Ratio: 2.8 Ratio
VLDL: 24 mg/dL (ref 0–40)

## 2012-04-01 LAB — PSA: PSA: 1.48 ng/mL (ref <=4.00)

## 2012-04-01 LAB — TSH: TSH: 1.352 u[IU]/mL (ref 0.350–4.500)

## 2012-04-01 LAB — BASIC METABOLIC PANEL
CO2: 28 mEq/L (ref 19–32)
Calcium: 9.5 mg/dL (ref 8.4–10.5)
Chloride: 101 mEq/L (ref 96–112)
Sodium: 138 mEq/L (ref 135–145)

## 2012-04-01 LAB — CBC
Platelets: 249 10*3/uL (ref 150–400)
RBC: 4.85 MIL/uL (ref 4.22–5.81)
WBC: 7.4 10*3/uL (ref 4.0–10.5)

## 2012-04-02 LAB — URINALYSIS, ROUTINE W REFLEX MICROSCOPIC
Hgb urine dipstick: NEGATIVE
Ketones, ur: NEGATIVE mg/dL
Nitrite: NEGATIVE
Urobilinogen, UA: 0.2 mg/dL (ref 0.0–1.0)

## 2012-04-08 ENCOUNTER — Ambulatory Visit: Payer: BC Managed Care – PPO | Admitting: Internal Medicine

## 2012-04-26 ENCOUNTER — Encounter: Payer: Self-pay | Admitting: Internal Medicine

## 2012-04-26 ENCOUNTER — Ambulatory Visit (INDEPENDENT_AMBULATORY_CARE_PROVIDER_SITE_OTHER): Payer: BC Managed Care – PPO | Admitting: Internal Medicine

## 2012-04-26 VITALS — BP 124/76 | HR 67 | Temp 97.9°F | Resp 16 | Ht 71.0 in | Wt 194.0 lb

## 2012-04-26 DIAGNOSIS — S46819A Strain of other muscles, fascia and tendons at shoulder and upper arm level, unspecified arm, initial encounter: Secondary | ICD-10-CM

## 2012-04-26 DIAGNOSIS — S46219A Strain of muscle, fascia and tendon of other parts of biceps, unspecified arm, initial encounter: Secondary | ICD-10-CM | POA: Insufficient documentation

## 2012-04-26 DIAGNOSIS — Z0000 Encounter for general adult medical examination without abnormal findings: Secondary | ICD-10-CM

## 2012-04-26 MED ORDER — SILDENAFIL CITRATE 100 MG PO TABS: ORAL_TABLET | ORAL | Status: DC

## 2012-04-26 MED ORDER — OMEPRAZOLE 20 MG PO CPDR: 20.0000 mg | DELAYED_RELEASE_CAPSULE | Freq: Every day | ORAL | Status: DC

## 2012-04-26 NOTE — Assessment & Plan Note (Signed)
Nl. Labs reviewed. zostavax prescription provided to check price at local pharmacy

## 2012-04-26 NOTE — Progress Notes (Signed)
  Subjective:    Patient ID: William Phillips, male    DOB: 04/21/1951, 61 y.o.   MRN: [id number]  HPI Pt presents to clinic for annual exam. Notes right bicep muscle protrusion with flexion. No decreased ROM or pain. Does not recall trauma/injury.   Past Medical History  Diagnosis Date  . Allergic rhinitis   . Hyperlipidemia   . Hypertension   . History of carpal tunnel syndrome   . Ankle fracture 1998   Past Surgical History  Procedure Date  . Inguinal hernia repair 2008  . Colonoscopy 08/04/2002    Normal Exam- Dr [name]    reports that he has quit smoking. He has never used smokeless tobacco. He reports that he drinks alcohol. His drug history not on file. family history includes Coronary artery disease in his father; Diabetes type II in his maternal grandfather; Heart attack (age of onset:77) in his father; Hyperlipidemia in an unspecified family member; and Hypertension in an unspecified family member. Allergies  Allergen Reactions  . Penicillins     REACTION: swelling , irriation     Review of Systems see hpi     Objective:   Physical Exam  Physical Exam  Nursing note and vitals reviewed. Constitutional: He appears well-developed and well-nourished. No distress.  HENT:  Head: Normocephalic and atraumatic.  Right Ear: Tympanic membrane and external ear normal.  Left Ear: Tympanic membrane and external ear normal.  Nose: Nose normal.  Mouth/Throat: Uvula is midline, oropharynx is clear and moist and mucous membranes are normal. No oropharyngeal exudate.  Eyes: Conjunctivae and EOM are normal. Pupils are equal, round, and reactive to light. Right eye exhibits no discharge. Left eye exhibits no discharge. No scleral icterus.  Neck: Neck supple. Carotid bruit is not present. No thyromegaly present.  Cardiovascular: Normal rate, regular rhythm and normal heart sounds.  Exam reveals no gallop and no friction rub.   No murmur heard. Pulmonary/Chest: Effort normal and  breath sounds normal. No respiratory distress. He has no wheezes. He has no rales.  Abdominal: Soft. He exhibits no distension and no mass. There is no hepatosplenomegaly. There is no tenderness. There is no rebound. Hernia confirmed negative in the right inguinal area and confirmed negative in the left inguinal area.   Lymphadenopathy:    He has no cervical adenopathy.  Neurological: He is alert.  Skin: Skin is warm and dry. He is not diaphoretic.  Psychiatric: He has a normal mood and affect.   MSK: right upper arm-protruding biceps muscle. NT. FROM. Strength intact     Assessment & Plan:

## 2012-04-26 NOTE — Patient Instructions (Signed)
Please schedule fasting labs prior to next visit chem7-v58.69 and lipid/lft-272.4 

## 2012-04-26 NOTE — Assessment & Plan Note (Signed)
Exam suggests possible tear but does not seem to impair function of arm. Orthopedic consult

## 2012-07-19 ENCOUNTER — Telehealth: Payer: Self-pay | Admitting: Internal Medicine

## 2012-07-19 MED ORDER — QUINAPRIL-HYDROCHLOROTHIAZIDE 20-12.5 MG PO TABS: 1.0000 | ORAL_TABLET | Freq: Every day | ORAL | Status: DC

## 2012-07-19 NOTE — Telephone Encounter (Signed)
Refill- quinapril/hyctz 20-12.5 tab. Take one tablet by mouth every day. Qty 90 last fill 7.25.13

## 2012-10-18 ENCOUNTER — Telehealth: Payer: Self-pay | Admitting: Internal Medicine

## 2012-10-18 DIAGNOSIS — Z79899 Other long term (current) drug therapy: Secondary | ICD-10-CM

## 2012-10-18 DIAGNOSIS — E785 Hyperlipidemia, unspecified: Secondary | ICD-10-CM

## 2012-10-18 NOTE — Telephone Encounter (Signed)
Please schedule fasting labs prior to next visit  chem7-v58.69 and lipid/lft 272.4    Patient will be coming in to [facility] lab sometime this week.

## 2012-10-19 NOTE — Telephone Encounter (Signed)
Lab orders entered & copy forwarded to [facility] 01.07.14

## 2012-10-20 LAB — BASIC METABOLIC PANEL
BUN: 11 mg/dL (ref 6–23)
Chloride: 99 mEq/L (ref 96–112)
Creat: 0.86 mg/dL (ref 0.50–1.35)
Glucose, Bld: 119 mg/dL — ABNORMAL HIGH (ref 70–99)
Potassium: 4.2 mEq/L (ref 3.5–5.3)

## 2012-10-20 LAB — HEPATIC FUNCTION PANEL
Alkaline Phosphatase: 55 U/L (ref 39–117)
Bilirubin, Direct: 0.1 mg/dL (ref 0.0–0.3)
Indirect Bilirubin: 0.6 mg/dL (ref 0.0–0.9)
Total Protein: 7 g/dL (ref 6.0–8.3)

## 2012-10-20 LAB — LIPID PANEL
Cholesterol: 232 mg/dL — ABNORMAL HIGH (ref 0–200)
LDL Cholesterol: 163 mg/dL — ABNORMAL HIGH (ref 0–99)
Triglycerides: 93 mg/dL (ref <150)
VLDL: 19 mg/dL (ref 0–40)

## 2012-10-20 NOTE — Addendum Note (Signed)
Addended by: Regis Bill on: 10/20/2012 09:29 AM   Modules accepted: Orders

## 2012-10-25 ENCOUNTER — Encounter: Payer: Self-pay | Admitting: Internal Medicine

## 2012-10-25 ENCOUNTER — Ambulatory Visit (INDEPENDENT_AMBULATORY_CARE_PROVIDER_SITE_OTHER): Payer: BC Managed Care – PPO | Admitting: Internal Medicine

## 2012-10-25 VITALS — BP 116/72 | HR 83 | Temp 97.7°F | Resp 16 | Ht 71.0 in | Wt 198.2 lb

## 2012-10-25 DIAGNOSIS — R739 Hyperglycemia, unspecified: Secondary | ICD-10-CM

## 2012-10-25 DIAGNOSIS — E785 Hyperlipidemia, unspecified: Secondary | ICD-10-CM

## 2012-10-25 DIAGNOSIS — R7309 Other abnormal glucose: Secondary | ICD-10-CM

## 2012-10-25 DIAGNOSIS — I10 Essential (primary) hypertension: Secondary | ICD-10-CM

## 2012-10-25 NOTE — Progress Notes (Signed)
  Subjective:    Patient ID: William Phillips, male    DOB: [birth year], 62 y.o.   MRN: [id number]  HPI Pt presents to clinic for followup of multiple medical problems. BP reviewed as normotensive. Self dc'ed statin since last visit but no obvious side effects attributed to the medication. Received flu vaccine for the season.  Past Medical History  Diagnosis Date  . Allergic rhinitis   . Hyperlipidemia   . Hypertension   . History of carpal tunnel syndrome   . Ankle fracture 1998   Past Surgical History  Procedure Date  . Inguinal hernia repair 2008  . Colonoscopy 08/04/2002    Normal Exam- Dr [name]    reports that he has quit smoking. He has never used smokeless tobacco. He reports that he drinks alcohol. His drug history not on file. family history includes Coronary artery disease in his father; Diabetes type II in his maternal grandfather; Heart attack (age of onset:77) in his father; Hyperlipidemia in an unspecified family member; and Hypertension in an unspecified family member. Allergies  Allergen Reactions  . Penicillins     REACTION: swelling , irriation       Review of Systems see hpi    Objective:   Physical Exam  Physical Exam  Nursing note and vitals reviewed. Constitutional: Appears well-developed and well-nourished. No distress.  HENT:  Head: Normocephalic and atraumatic.  Right Ear: External ear normal.  Left Ear: External ear normal.  Eyes: Conjunctivae are normal. No scleral icterus.  Neck: Neck supple. Carotid bruit is not present.  Cardiovascular: Normal rate, regular rhythm and normal heart sounds.  Exam reveals no gallop and no friction rub.   No murmur heard. Pulmonary/Chest: Effort normal and breath sounds normal. No respiratory distress. He has no wheezes. no rales.  Lymphadenopathy:    He has no cervical adenopathy.  Neurological:Alert.  Skin: Skin is warm and dry. Not diaphoretic.  Psychiatric: Has a normal mood and affect.          Assessment & Plan:

## 2012-10-25 NOTE — Assessment & Plan Note (Signed)
Normotensive and stable. Continue current regimen. Monitor bp as outpt and followup in clinic as scheduled.  

## 2012-10-25 NOTE — Patient Instructions (Signed)
Please schedule fasting labs prior to next visit Lipid/lft-272.4 and a1c-hyperglycemia

## 2012-10-25 NOTE — Assessment & Plan Note (Signed)
Recommend low sugar/carb diet, exercise and weight loss. Obtain a1c with next labs

## 2012-10-25 NOTE — Assessment & Plan Note (Signed)
suboptimal control off statin. Resume zocor.

## 2012-11-03 ENCOUNTER — Telehealth: Payer: Self-pay | Admitting: Internal Medicine

## 2012-11-03 MED ORDER — SIMVASTATIN 20 MG PO TABS: 20.0000 mg | ORAL_TABLET | Freq: Every day | ORAL | Status: DC

## 2012-11-03 MED ORDER — AMLODIPINE BESYLATE 5 MG PO TABS: 5.0000 mg | ORAL_TABLET | Freq: Every day | ORAL | Status: DC

## 2012-11-03 NOTE — Telephone Encounter (Signed)
Refill-amlodipine 5mg  tab. Take one tablet by mouth every day. Qty 90 last fill 10.7.13  Refill- simvastatin 20mg  tab. Take one tablet by mouth every day at bedtime. Qty 90 last fill 10.7.13

## 2012-11-30 ENCOUNTER — Ambulatory Visit (INDEPENDENT_AMBULATORY_CARE_PROVIDER_SITE_OTHER): Payer: BC Managed Care – PPO | Admitting: Family

## 2012-11-30 ENCOUNTER — Encounter: Payer: Self-pay | Admitting: Family

## 2012-11-30 VITALS — BP 110/86 | HR 68 | Temp 98.5°F | Resp 16 | Wt 194.1 lb

## 2012-11-30 DIAGNOSIS — B029 Zoster without complications: Secondary | ICD-10-CM

## 2012-11-30 MED ORDER — VALACYCLOVIR HCL 1 G PO TABS: 1000.0000 mg | ORAL_TABLET | Freq: Three times a day (TID) | ORAL | Status: DC

## 2012-11-30 NOTE — Assessment & Plan Note (Signed)
?   Early shingles.  Will rx with valtrex as we are within the 48 hour window.

## 2012-11-30 NOTE — Progress Notes (Signed)
Subjective:    Patient ID: William Phillips, male    DOB: 07-Aug-1951, 62 y.o.   MRN: [id number]  HPI  William Phillips is a 62 yr old male who presents today with chief complaint of rash. Rash is located left hip and is associated with pain. First noticed pain in the area last night.  Used a heating pad which helped.   Rash started this AM. Denies associated fever.    Review of Systems See HPI  Past Medical History  Diagnosis Date  . Allergic rhinitis   . Hyperlipidemia   . Hypertension   . History of carpal tunnel syndrome   . Ankle fracture 1998    History   Social History  . Marital Status: Married    Spouse Name: N/A    Number of Children: N/A  . Years of Education: N/A   Occupational History  . Not on file.   Social History Main Topics  . Smoking status: Former Games developer  . Smokeless tobacco: Never Used     Comment: 5-10 pack year history  . Alcohol Use: Yes     Comment: 2-3 glasses of wine  . Drug Use: Not on file  . Sexually Active: Not on file   Other Topics Concern  . 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 Inguinal hernia repair  2008  . Colonoscopy  08/04/2002    Normal Exam- Dr [name]    Family History  Problem Relation Age of Onset  . Hyperlipidemia    . Hypertension    . Coronary artery disease Father   . Heart attack Father 80  . Diabetes type II Maternal Grandfather     Allergies  Allergen Reactions  . Penicillins     REACTION: swelling , irriation    Current Outpatient Prescriptions on File Prior to Visit  Medication Sig Dispense Refill  . amLODipine (NORVASC) 5 MG tablet Take 1 tablet (5 mg total) by mouth daily.  90 tablet  2  . aspirin 81 MG tablet Take 81 mg by  mouth daily.        . Multiple Vitamin (MULTIVITAMIN) tablet Take 1 tablet by mouth daily.        Marland Kitchen omeprazole (PRILOSEC) 20 MG capsule Take 1 capsule (20 mg total) by mouth daily. Take 1 capsule by mouth once a day 15 minutes before AM meal as needed.  90 capsule  3  . quinapril-hydrochlorothiazide (ACCURETIC) 20-12.5 MG per tablet Take 1 tablet by mouth daily.  90 tablet  1  . sildenafil (VIAGRA) 100 MG tablet 1 tablet by mouth once a day as needed      . simvastatin (ZOCOR) 20 MG tablet Take 1 tablet (20 mg total) by mouth at bedtime.  90 tablet  2   No current facility-administered medications on file prior to visit.    BP 110/86  Pulse 68  Temp(Src) 98.5 F (36.9 C) (Oral)  Resp 16  Wt 194 lb 1.9 oz (88.052 kg)  BMI 27.09 kg/m2  SpO2 99%       Objective:   Physical Exam  Constitutional: He is oriented to person, place, and time. He appears well-developed  and well-nourished. No distress.  Cardiovascular: Normal rate and regular rhythm.   No murmur heard. Pulmonary/Chest: Effort normal and breath sounds normal. No respiratory distress. He has no wheezes. He has no rales. He exhibits no tenderness.  Neurological: He is alert and oriented to person, place, and time.  Skin: Skin is warm and dry.     Raised erythematous patches noted left hip/groin area          Assessment & Plan:

## 2012-11-30 NOTE — Patient Instructions (Addendum)

## 2013-01-26 ENCOUNTER — Ambulatory Visit: Payer: BC Managed Care – PPO | Admitting: Internal Medicine

## 2013-02-01 ENCOUNTER — Telehealth: Payer: Self-pay | Admitting: Internal Medicine

## 2013-02-01 DIAGNOSIS — E785 Hyperlipidemia, unspecified: Secondary | ICD-10-CM

## 2013-02-01 DIAGNOSIS — R739 Hyperglycemia, unspecified: Secondary | ICD-10-CM

## 2013-02-01 NOTE — Telephone Encounter (Signed)
Patient coming tomorrow for labs  Please enter order Please schedule fasting labs prior to next visit  Lipid/lft-272.4 and a1c-hyperglycemia

## 2013-02-02 LAB — LIPID PANEL
HDL: 64 mg/dL (ref >39)
LDL Cholesterol: 77 mg/dL (ref 0–99)
Triglycerides: 140 mg/dL (ref <150)
VLDL: 28 mg/dL (ref 0–40)

## 2013-02-02 LAB — HEPATIC FUNCTION PANEL
Albumin: 4.5 g/dL (ref 3.5–5.2)
Total Bilirubin: 0.5 mg/dL (ref 0.3–1.2)
Total Protein: 6.8 g/dL (ref 6.0–8.3)

## 2013-02-02 LAB — HEMOGLOBIN A1C: Hgb A1c MFr Bld: 5.6 % (ref <5.7)

## 2013-02-03 ENCOUNTER — Ambulatory Visit: Payer: BC Managed Care – PPO | Admitting: Family Medicine

## 2013-02-04 ENCOUNTER — Encounter: Payer: Self-pay | Admitting: Family Medicine

## 2013-02-04 ENCOUNTER — Ambulatory Visit (INDEPENDENT_AMBULATORY_CARE_PROVIDER_SITE_OTHER): Payer: BC Managed Care – PPO | Admitting: Family Medicine

## 2013-02-04 VITALS — BP 122/80 | HR 70 | Temp 97.9°F | Ht 71.0 in | Wt 187.8 lb

## 2013-02-04 DIAGNOSIS — Z23 Encounter for immunization: Secondary | ICD-10-CM

## 2013-02-04 DIAGNOSIS — M1990 Unspecified osteoarthritis, unspecified site: Secondary | ICD-10-CM

## 2013-02-04 DIAGNOSIS — K219 Gastro-esophageal reflux disease without esophagitis: Secondary | ICD-10-CM

## 2013-02-04 DIAGNOSIS — I10 Essential (primary) hypertension: Secondary | ICD-10-CM

## 2013-02-04 MED ORDER — ZOSTER VACCINE LIVE 19400 UNT/0.65ML ~~LOC~~ SOLR
0.6500 mL | Freq: Once | SUBCUTANEOUS | Status: AC
  Administered 2013-02-04: 0.65 mL via SUBCUTANEOUS

## 2013-02-04 NOTE — Patient Instructions (Addendum)

## 2013-02-06 ENCOUNTER — Encounter: Payer: Self-pay | Admitting: Family Medicine

## 2013-02-06 DIAGNOSIS — M1990 Unspecified osteoarthritis, unspecified site: Secondary | ICD-10-CM

## 2013-02-06 HISTORY — DX: Unspecified osteoarthritis, unspecified site: M19.90

## 2013-02-06 NOTE — Assessment & Plan Note (Signed)
Well controlled on Omeprazole and diet changes.

## 2013-02-06 NOTE — Assessment & Plan Note (Signed)
Encouraged krill oil and maintain exercise.

## 2013-02-06 NOTE — Progress Notes (Signed)
Patient ID: William Phillips, male   DOB: 31-Dec-1950, 62 y.o.   MRN: [id number] William Phillips [id number] Feb 11, 1951 02/06/2013      Progress Note-Follow Up  Subjective  Chief Complaint  Chief Complaint  Patient presents with  . Follow-up    3 month on labs    HPI  Patient is a 62 year old Caucasian male who is in today for followup. Generally doing well. Had stopped his statin last year when he thought was continuing to myalgias. Unfortunately stopping the statin last is resolved. He has since restarted his statin but only recently and myalgia have not returned. At this time he is in. Does have some stiffness in his 80s but no falls her debility no recent illness. No fevers, headache, chest pain, palpitations, shortness of breath, GI or GU concerns  Past Medical History  Diagnosis Date  . Allergic rhinitis   . Hyperlipidemia   . Hypertension   . History of carpal tunnel syndrome   . Ankle fracture 1998  . OA (osteoarthritis) 02/06/2013    knees    Past Surgical History  Procedure Laterality Date  . Inguinal hernia repair  2008  . Colonoscopy  08/04/2002    Normal Exam- Dr [name]    Family History  Problem Relation Age of Onset  . Hyperlipidemia    . Hypertension    . Coronary artery disease Father   . Heart attack Father 75  . Diabetes type II Maternal Grandfather     History   Social History  . Marital Status: Married    Spouse Name: N/A    Number of Children: N/A  . Years of Education: N/A   Occupational History  . Not on file.   Social History Main Topics  . Smoking status: Former Games developer  . Smokeless tobacco: Never Used     Comment: 5-10 pack year history  . Alcohol Use: Yes     Comment: 2-3 glasses of wine  . Drug Use: Not on file  . Sexually Active: Not on file   Other Topics Concern  . Not on file   Social History Narrative   Occupation: Medical sales representative- chemicals, seed, erosion control)   Married- 35 years   68 daughter -junior  in   ([place]   Previous smoker - 5-10 pack yrs     Alcohol use-yes (2-3 glasses of wine)      Current Outpatient Prescriptions on File Prior to Visit  Medication Sig Dispense Refill  . amLODipine (NORVASC) 5 MG tablet Take 1 tablet (5 mg total) by mouth daily.  90 tablet  2  . aspirin 81 MG tablet Take 81 mg by mouth daily.        . Multiple Vitamin (MULTIVITAMIN) tablet Take 1 tablet by mouth daily.        Marland Kitchen omeprazole (PRILOSEC) 20 MG capsule Take 1 capsule (20 mg total) by mouth daily. Take 1 capsule by mouth once a day 15 minutes before AM meal as needed.  90 capsule  3  . quinapril-hydrochlorothiazide (ACCURETIC) 20-12.5 MG per tablet Take 1 tablet by mouth daily.  90 tablet  1  . sildenafil (VIAGRA) 100 MG tablet 1 tablet by mouth once a day as needed      . simvastatin (ZOCOR) 20 MG tablet Take 1 tablet (20 mg total) by mouth at bedtime.  90 tablet  2  . valACYclovir (VALTREX) 1000 MG tablet Take 1 tablet (1,000 mg total) by mouth 3 (three) times daily.  21 tablet  0   No current facility-administered medications on file prior to visit.    Allergies  Allergen Reactions  . Penicillins     REACTION: swelling , irriation    Review of Systems  Review of Systems  Constitutional: Negative for fever and malaise/fatigue.  HENT: Negative for congestion.   Eyes: Negative for discharge.  Respiratory: Negative for shortness of breath.   Cardiovascular: Negative for chest pain, palpitations and leg swelling.  Gastrointestinal: Negative for nausea, abdominal pain and diarrhea.  Genitourinary: Negative for dysuria.  Musculoskeletal: Positive for joint pain. Negative for falls.       Knees  Skin: Negative for rash.  Neurological: Negative for loss of consciousness and headaches.  Endo/Heme/Allergies: Negative for polydipsia.  Psychiatric/Behavioral: Negative for depression and suicidal ideas. The patient is not nervous/anxious and does not have insomnia.      Objective  BP 122/80  Pulse 70  Temp(Src) 97.9 F (36.6 C) (Temporal)  Ht 5\' 11"  (1.803 m)  Wt 187 lb 12.8 oz (85.186 kg)  BMI 26.2 kg/m2  SpO2 97%  Physical Exam  Physical Exam  Constitutional: He is oriented to person, place, and time and well-developed, well-nourished, and in no distress. No distress.  HENT:  Head: Normocephalic and atraumatic.  Eyes: Conjunctivae are normal.  Neck: Neck supple. No thyromegaly present.  Cardiovascular: Normal rate, regular rhythm and normal heart sounds.   No murmur heard. Pulmonary/Chest: Effort normal and breath sounds normal. No respiratory distress.  Abdominal: He exhibits no distension and no mass. There is no tenderness.  Musculoskeletal: He exhibits no edema.  Neurological: He is alert and oriented to person, place, and time.  Skin: Skin is warm.  Psychiatric: Memory, affect and judgment normal.    Lab Results  Component Value Date   TSH 1.352 04/01/2012   Lab Results  Component Value Date   WBC 7.4 04/01/2012   HGB 15.6 04/01/2012   HCT 45.5 04/01/2012   MCV 93.8 04/01/2012   PLT 249 04/01/2012   Lab Results  Component Value Date   CREATININE 0.86 10/20/2012   BUN 11 10/20/2012   NA 136 10/20/2012   K 4.2 10/20/2012   CL 99 10/20/2012   CO2 30 10/20/2012   Lab Results  Component Value Date   ALT 20 02/01/2013   AST 18 02/01/2013   ALKPHOS 47 02/01/2013   BILITOT 0.5 02/01/2013   Lab Results  Component Value Date   CHOL 169 02/01/2013   Lab Results  Component Value Date   HDL 64 02/01/2013   Lab Results  Component Value Date   LDLCALC 77 02/01/2013   Lab Results  Component Value Date   TRIG 140 02/01/2013   Lab Results  Component Value Date   CHOLHDL 2.6 02/01/2013     Assessment & Plan  HYPERTENSION Well controlled, no changes.  GERD Well controlled on Omeprazole and diet changes.  OA (osteoarthritis) Encouraged krill oil and maintain exercise.

## 2013-02-06 NOTE — Assessment & Plan Note (Signed)
Well controlled, no changes 

## 2013-02-09 ENCOUNTER — Telehealth: Payer: Self-pay | Admitting: Family Medicine

## 2013-02-09 MED ORDER — QUINAPRIL-HYDROCHLOROTHIAZIDE 20-12.5 MG PO TABS: 1.0000 | ORAL_TABLET | Freq: Every day | ORAL | Status: DC

## 2013-02-09 NOTE — Telephone Encounter (Signed)
Refill- accuretic 20/12.5 tab. Take one tablet by mouth every day. Qty 90 last fill 1.19.14

## 2013-04-28 ENCOUNTER — Other Ambulatory Visit (INDEPENDENT_AMBULATORY_CARE_PROVIDER_SITE_OTHER): Payer: BC Managed Care – PPO

## 2013-04-28 DIAGNOSIS — Z0000 Encounter for general adult medical examination without abnormal findings: Secondary | ICD-10-CM

## 2013-04-28 LAB — RENAL FUNCTION PANEL
CO2: 30 mEq/L (ref 19–32)
Chloride: 98 mEq/L (ref 96–112)
GFR: 96.08 mL/min (ref >60.00)
Sodium: 135 mEq/L (ref 135–145)

## 2013-04-28 LAB — LIPID PANEL
HDL: 67.4 mg/dL (ref >39.00)
LDL Cholesterol: 101 mg/dL — ABNORMAL HIGH (ref 0–99)
Total CHOL/HDL Ratio: 3
Triglycerides: 86 mg/dL (ref 0.0–149.0)

## 2013-04-28 LAB — TSH: TSH: 1.21 u[IU]/mL (ref 0.35–5.50)

## 2013-04-28 LAB — CBC
MCHC: 33.6 g/dL (ref 30.0–36.0)
Platelets: 197 10*3/uL (ref 150.0–400.0)
RBC: 4.67 Mil/uL (ref 4.22–5.81)

## 2013-04-28 LAB — HEPATIC FUNCTION PANEL: Albumin: 4.5 g/dL (ref 3.5–5.2)

## 2013-04-28 NOTE — Progress Notes (Signed)
Labs only

## 2013-05-02 ENCOUNTER — Other Ambulatory Visit: Payer: Self-pay | Admitting: Internal Medicine

## 2013-05-05 NOTE — Telephone Encounter (Signed)
Please advise re: viagra refill.  Medication name:  Name from pharmacy:  VIAGRA 100 MG tablet  VIAGRA 100MG  TAB The source prescription has been discontinued. Sig: TAKE ONE TABLET BY MOUTH EVERY DAY AS NEEDED Dispense: 10 tablet (Pharmacy requested 10 each) Refills: 0 Start: 05/02/2013 Class: Normal Non-formulary Requested on: 04/26/2012 Originally ordered on: 04/15/2011 Last refill: 02/05/2013

## 2013-05-06 ENCOUNTER — Ambulatory Visit (INDEPENDENT_AMBULATORY_CARE_PROVIDER_SITE_OTHER): Payer: BC Managed Care – PPO | Admitting: Family Medicine

## 2013-05-06 ENCOUNTER — Encounter: Payer: Self-pay | Admitting: Family Medicine

## 2013-05-06 VITALS — BP 122/84 | HR 61 | Temp 98.3°F | Ht 71.0 in | Wt 185.1 lb

## 2013-05-06 DIAGNOSIS — M2040 Other hammer toe(s) (acquired), unspecified foot: Secondary | ICD-10-CM

## 2013-05-06 DIAGNOSIS — R7309 Other abnormal glucose: Secondary | ICD-10-CM

## 2013-05-06 DIAGNOSIS — R739 Hyperglycemia, unspecified: Secondary | ICD-10-CM

## 2013-05-06 DIAGNOSIS — I10 Essential (primary) hypertension: Secondary | ICD-10-CM

## 2013-05-06 DIAGNOSIS — M2041 Other hammer toe(s) (acquired), right foot: Secondary | ICD-10-CM

## 2013-05-06 DIAGNOSIS — E785 Hyperlipidemia, unspecified: Secondary | ICD-10-CM

## 2013-05-06 DIAGNOSIS — Z0000 Encounter for general adult medical examination without abnormal findings: Secondary | ICD-10-CM

## 2013-05-06 DIAGNOSIS — R350 Frequency of micturition: Secondary | ICD-10-CM

## 2013-05-06 HISTORY — DX: Other hammer toe(s) (acquired), right foot: M20.41

## 2013-05-06 LAB — URINALYSIS
Glucose, UA: NEGATIVE mg/dL
Hgb urine dipstick: NEGATIVE
Ketones, ur: NEGATIVE mg/dL
pH: 7.5 (ref 5.0–8.0)

## 2013-05-06 MED ORDER — SIMVASTATIN 20 MG PO TABS: 20.0000 mg | ORAL_TABLET | Freq: Every day | ORAL | Status: DC

## 2013-05-06 MED ORDER — QUINAPRIL-HYDROCHLOROTHIAZIDE 20-12.5 MG PO TABS: 1.0000 | ORAL_TABLET | Freq: Every day | ORAL | Status: DC

## 2013-05-06 MED ORDER — AMLODIPINE BESYLATE 5 MG PO TABS: 5.0000 mg | ORAL_TABLET | Freq: Every day | ORAL | Status: DC

## 2013-05-06 MED ORDER — MELOXICAM 15 MG PO TABS: 15.0000 mg | ORAL_TABLET | Freq: Every day | ORAL | Status: DC | PRN

## 2013-05-06 NOTE — Assessment & Plan Note (Signed)
Well controlled, no changes to meds 

## 2013-05-06 NOTE — Patient Instructions (Addendum)
Labs prior to visit, lipid, renal, cbc, tsh, hepatic, hgba1c   Hammer Toes Hammer toes occur when the joint in one or more of your toes is permanently flexed. CAUSES  This happens when a muscle imbalance or abnormal bone length makes the small toes buckle under. This causes the toe joint to contract. This causes the tendons (cord like structure) to shorten.  SYMPTOMS   When hammer toes are flexible, you can straighten the buckled joint with your hand. Flexible hammer toes may develop into rigid hammer toes over time. Common symptoms of flexible hammer toes include:  Corns (build-ups of skin cells). Corns occur where boney bumps come in frequent contact with hard surfaces. For example, where your shoes press and rub.  Irritation.  Inflammation.  Pain.  Toe motion is limited.  When a rigid hammer toe is fixed you can no longer straighten the buckled joint. Corns, irritation, pain, and loss of motion is generally worse for rigid hammer toes than for flexible ones. TREATMENT  The problems noted above if painful or troublesome can be corrected with surgery. This is an elective surgery, so you can pick a convenient time for the procedure. The surgery may:  Improve appearance.  Relieve pain.  Improve function. You may be asked not to put weight on this foot for a few weeks. There are several types of surgical treatments. Common treatments are listed below. Your surgeon will discuss what is be best for you.   With arthroplasty, a portion of the joint is surgically removed and the toe is straightened. The "gap" fills in with fibroustissue. This helps with pain, deformity and function.  With fusion, cartilage between the two bones is taken out and the bones heal as one longer bone. This helps keep the toe stable and reduces pain but leaves the toe stiff, yet straight.  With implant, a portion of the bone is removed and replaced with an implant to restore motion.  Flexor tendon transfers  may be used to release the deforming force which buckles the toe. This is done by the repositioning of the tendons that curl the toes down (flexor tendons). Several of these options require fixing the toe with a pin that is visible at the tip of the toe. The pin keeps the toe straight during healing. It is generally removed in the office at 4-8 weeks after the corrective procedure. Generally, removing the pin is not painful.  LET YOUR CAREGIVER KNOW ABOUT:  Allergies.  Medications taken including herbs, eye drops, over the counter medications, and creams.  Use of steroids (by mouth or creams).  Previous problems with anesthetics or novocaine.  Possibility of pregnancy, if this applies.  History of blood clots (thrombophlebitis).  History of bleeding or blood problems.  Previous surgery.  Other health problems.  Family history of anesthetic problems. BEFORE THE PROCEDURE You should be present 60 minutes prior to your procedure unless otherwise directed by your caregiver.  RISKS AND COMPLICATIONS  If surgery is recommended, your caregiver will explain your foot problem and how surgery can improve it. Your caregiver can answer questions you may have about potential risks and complications involved.  Let your caregiver know about health changes prior to surgery. It is best to do elective surgeries when you are healthy. Be sure to ask your caregiver how long you will be off your feet and if you need to be off work. Plan accordingly. Your foot and ankle may be immobilized by a cast (from your toes to below  your knee). You may be asked not to bear weight on this foot for a few weeks. AFTER THE PROCEDURE You can bear weight as instructed. You may need a bandage, splint, and removable cast boot or surgical shoe for several weeks after surgery. You may resume normal diet and activities as directed. Only take over-the-counter or prescription medicines for pain, discomfort, or fever as directed by  your caregiver. SEEK MEDICAL CARE IF:   You have increased bleeding (more than a small spot) from the wound.  You notice redness, swelling, or increasing pain in the wound.  You notice pus coming from the wound or the pin that is used to stabilize the toe.  You notice a bad smell coming from the wound or dressing. SEEK IMMEDIATE MEDICAL CARE IF:   You have a fever.  You develop a rash.  You have difficulty breathing.  You have any allergic problems. Document Released: 09/26/2000 Document Revised: 12/22/2011 Document Reviewed: 10/27/2008 [facility] Patient Information 2014 [place], [place].

## 2013-05-10 NOTE — Progress Notes (Signed)
Patient ID: William Phillips, male   DOB: December 17, 1950, 62 y.o.   MRN: [id number] William Phillips [id number] [birth year] 05/10/2013      Progress Note-Follow Up  Subjective  Chief Complaint  Chief Complaint  Patient presents with  . Annual Exam    physical    HPI  62 year old Caucasian male who is in today for annual exam. Doing well. No recent illness. No chest pain, palpitations, shortness of breath, GI or GU concerns are noted today. Has persistent pain in his right great toe is considering surgical correction. Had a normal colonoscopy in 2011. Is taking medications as prescribed. No significant trouble with heartburn while taking his omeprazole.  Past Medical History  Diagnosis Date  . Allergic rhinitis   . Hyperlipidemia   . Hypertension   . History of carpal tunnel syndrome   . Ankle fracture 1998  . OA (osteoarthritis) 02/06/2013    knees  . Hammer toe of right foot 05/06/2013    Past Surgical History  Procedure Laterality Date  . Inguinal hernia repair  2008  . Colonoscopy  08/04/2002    Normal Exam- Dr [name]    Family History  Problem Relation Age of Onset  . Hyperlipidemia    . Hypertension    . Coronary artery disease Father     aortic valve disease  . Heart attack Father 20  . COPD Father   . Hypertension Father   . Hyperlipidemia Father   . Dementia Father   . Diabetes type II Maternal Grandfather   . Arthritis Mother   . Diabetes Sister   . Diabetes Brother     History   Social History  . Marital Status: Married    Spouse Name: N/A    Number of Children: N/A  . Years of Education: N/A   Occupational History  . Not on file.   Social History Main Topics  . Smoking status: Former Games developer  . Smokeless tobacco: Never Used     Comment: 5-10 pack year history  . Alcohol Use: Yes     Comment: 2-3 glasses of wine  . Drug Use: Not on file  . Sexually Active: Yes   Other Topics Concern  . Not on file   Social History Narrative   Occupation:  Medical sales representative- chemicals, seed, erosion control)   Married- 35 years   56 daughter -junior in   ([place]   Previous smoker - 5-10 pack yrs     Alcohol use-yes (2-3 glasses of wine)      Current Outpatient Prescriptions on File Prior to Visit  Medication Sig Dispense Refill  . aspirin 81 MG tablet Take 81 mg by mouth daily.        . Multiple Vitamin (MULTIVITAMIN) tablet Take 1 tablet by mouth daily.        Marland Kitchen omeprazole (PRILOSEC) 20 MG capsule TAKE ONE CAPSULE BY MOUTH EVERY DAY 15 MINUTES BEFORE AM MEAL AS NEEDED  90 capsule  3  . sildenafil (VIAGRA) 100 MG tablet 1 tablet by mouth once a day as needed      . VIAGRA 100 MG tablet TAKE ONE TABLET BY MOUTH EVERY DAY AS NEEDED  10 tablet  0   No current facility-administered medications on file prior to visit.    Allergies  Allergen Reactions  . Penicillins     REACTION: swelling , irriation    Review of Systems  Review of Systems  Constitutional: Negative for fever and malaise/fatigue.  HENT:  Negative for congestion.   Eyes: Negative for pain and discharge.  Respiratory: Negative for shortness of breath.   Cardiovascular: Negative for chest pain, palpitations and leg swelling.  Gastrointestinal: Negative for nausea, abdominal pain and diarrhea.  Genitourinary: Negative for dysuria.  Musculoskeletal: Negative for falls.  Skin: Negative for rash.  Neurological: Negative for loss of consciousness and headaches.  Endo/Heme/Allergies: Negative for polydipsia.  Psychiatric/Behavioral: Negative for depression and suicidal ideas. The patient is not nervous/anxious and does not have insomnia.     Objective  BP 122/84  Pulse 61  Temp(Src) 98.3 F (36.8 C) (Oral)  Ht 5\' 11"  (1.803 m)  Wt 185 lb 1.3 oz (83.952 kg)  BMI 25.82 kg/m2  SpO2 98%  Physical Exam  Physical Exam  Constitutional: He is oriented to person, place, and time and well-developed, well-nourished, and in no distress. No distress.  HENT:   Head: Normocephalic and atraumatic.  Eyes: Conjunctivae are normal.  Neck: Neck supple. No thyromegaly present.  Cardiovascular: Normal rate and regular rhythm.  Exam reveals gallop.   No murmur heard. Pulmonary/Chest: Effort normal and breath sounds normal. No respiratory distress.  Abdominal: He exhibits no distension and no mass. There is no tenderness.  Musculoskeletal: He exhibits no edema.  Neurological: He is alert and oriented to person, place, and time.  Skin: Skin is warm.  Psychiatric: Memory, affect and judgment normal.    Lab Results  Component Value Date   TSH 1.21 04/28/2013   Lab Results  Component Value Date   WBC 6.9 04/28/2013   HGB 15.4 04/28/2013   HCT 45.9 04/28/2013   MCV 98.3 04/28/2013   PLT 197.0 04/28/2013   Lab Results  Component Value Date   CREATININE 0.9 04/28/2013   BUN 13 04/28/2013   NA 135 04/28/2013   K 4.7 04/28/2013   CL 98 04/28/2013   CO2 30 04/28/2013   Lab Results  Component Value Date   ALT 16 04/28/2013   AST 18 04/28/2013   ALKPHOS 45 04/28/2013   BILITOT 0.9 04/28/2013   Lab Results  Component Value Date   CHOL 186 04/28/2013   Lab Results  Component Value Date   HDL 67.40 04/28/2013   Lab Results  Component Value Date   LDLCALC 101* 04/28/2013   Lab Results  Component Value Date   TRIG 86.0 04/28/2013   Lab Results  Component Value Date   CHOLHDL 3 04/28/2013     Assessment & Plan  HYPERTENSION Well controlled, no changes to meds  Hammer toe of right foot Surgery has been recommended by podiatry, Dr Ralene Cork but patient would like a second opinion, will  Refer to ortho for further consideration  Hyperglycemia hgba1c 5.7, minimize simple carbs  HYPERLIPIDEMIA Tolerating Simvastatin, avoid trans fats, increase exercise, no further changes  Annual physical exam Doing well, encouraged DASH diet and increased exercise. 8 hours of sleep is also encouraged

## 2013-05-10 NOTE — Assessment & Plan Note (Signed)
Doing well, encouraged DASH diet and increased exercise. 8 hours of sleep is also encouraged

## 2013-05-10 NOTE — Assessment & Plan Note (Signed)
Tolerating Simvastatin, avoid trans fats, increase exercise, no further changes

## 2013-05-10 NOTE — Assessment & Plan Note (Signed)
hgba1c 5.7, minimize simple carbs

## 2013-05-10 NOTE — Assessment & Plan Note (Signed)
Surgery has been recommended by podiatry, Dr Ralene Cork but patient would like a second opinion, will  Refer to ortho for further consideration

## 2013-08-18 ENCOUNTER — Other Ambulatory Visit: Payer: Self-pay

## 2013-10-24 ENCOUNTER — Telehealth: Payer: Self-pay | Admitting: Family Medicine

## 2013-10-24 NOTE — Telephone Encounter (Signed)
Having a root cancl next Tuesday.l  Should he stop the baby aspirin

## 2013-10-25 NOTE — Telephone Encounter (Signed)
Please advise 

## 2013-10-25 NOTE — Telephone Encounter (Signed)
OK to stop aspirin til day after root canal

## 2013-10-26 ENCOUNTER — Telehealth: Payer: Self-pay | Admitting: *Deleted

## 2013-10-26 DIAGNOSIS — Z79899 Other long term (current) drug therapy: Secondary | ICD-10-CM

## 2013-10-26 DIAGNOSIS — R739 Hyperglycemia, unspecified: Secondary | ICD-10-CM

## 2013-10-26 DIAGNOSIS — E785 Hyperlipidemia, unspecified: Secondary | ICD-10-CM

## 2013-10-26 DIAGNOSIS — I10 Essential (primary) hypertension: Secondary | ICD-10-CM

## 2013-10-26 LAB — HEPATIC FUNCTION PANEL
ALBUMIN: 4.2 g/dL (ref 3.5–5.2)
ALK PHOS: 58 U/L (ref 39–117)
ALT: 20 U/L (ref 0–53)
AST: 26 U/L (ref 0–37)
BILIRUBIN DIRECT: 0.2 mg/dL (ref 0.0–0.3)
BILIRUBIN TOTAL: 0.9 mg/dL (ref 0.3–1.2)
Indirect Bilirubin: 0.7 mg/dL (ref 0.0–0.9)
Total Protein: 7.1 g/dL (ref 6.0–8.3)

## 2013-10-26 LAB — BASIC METABOLIC PANEL
BUN: 11 mg/dL (ref 6–23)
CHLORIDE: 98 meq/L (ref 96–112)
CO2: 28 mEq/L (ref 19–32)
Calcium: 9.2 mg/dL (ref 8.4–10.5)
Creat: 0.85 mg/dL (ref 0.50–1.35)
GLUCOSE: 109 mg/dL — AB (ref 70–99)
POTASSIUM: 4 meq/L (ref 3.5–5.3)
Sodium: 135 mEq/L (ref 135–145)

## 2013-10-26 LAB — CBC WITH DIFFERENTIAL/PLATELET
Basophils Absolute: 0 10*3/uL (ref 0.0–0.1)
Basophils Relative: 1 % (ref 0–1)
EOS PCT: 2 % (ref 0–5)
Eosinophils Absolute: 0.2 10*3/uL (ref 0.0–0.7)
HEMATOCRIT: 46.2 % (ref 39.0–52.0)
HEMOGLOBIN: 15.7 g/dL (ref 13.0–17.0)
LYMPHS PCT: 29 % (ref 12–46)
Lymphs Abs: 1.8 10*3/uL (ref 0.7–4.0)
MCH: 32.2 pg (ref 26.0–34.0)
MCHC: 34 g/dL (ref 30.0–36.0)
MCV: 94.9 fL (ref 78.0–100.0)
MONO ABS: 0.9 10*3/uL (ref 0.1–1.0)
MONOS PCT: 15 % — AB (ref 3–12)
NEUTROS ABS: 3.4 10*3/uL (ref 1.7–7.7)
Neutrophils Relative %: 53 % (ref 43–77)
Platelets: 245 10*3/uL (ref 150–400)
RBC: 4.87 MIL/uL (ref 4.22–5.81)
RDW: 12.8 % (ref 11.5–15.5)
WBC: 6.4 10*3/uL (ref 4.0–10.5)

## 2013-10-26 LAB — LIPID PANEL
Cholesterol: 154 mg/dL (ref 0–200)
HDL: 63 mg/dL (ref >39)
LDL CALC: 71 mg/dL (ref 0–99)
Total CHOL/HDL Ratio: 2.4 Ratio
Triglycerides: 101 mg/dL (ref <150)
VLDL: 20 mg/dL (ref 0–40)

## 2013-10-26 LAB — HEMOGLOBIN A1C
HEMOGLOBIN A1C: 6 % — AB (ref <5.7)
MEAN PLASMA GLUCOSE: 126 mg/dL — AB (ref <117)

## 2013-10-26 NOTE — Telephone Encounter (Signed)
Lab orders placed from previous semi-annual visits [not stated in last OV note]; presented to lab/SLS

## 2013-10-26 NOTE — Telephone Encounter (Signed)
Spoke with patient and advised him about the baby aspirin

## 2013-10-31 ENCOUNTER — Ambulatory Visit: Payer: BC Managed Care – PPO | Admitting: Family Medicine

## 2013-11-01 ENCOUNTER — Ambulatory Visit: Payer: BC Managed Care – PPO | Admitting: Family Medicine

## 2013-11-02 ENCOUNTER — Encounter: Payer: Self-pay | Admitting: Family Medicine

## 2013-11-02 ENCOUNTER — Ambulatory Visit (INDEPENDENT_AMBULATORY_CARE_PROVIDER_SITE_OTHER): Payer: BC Managed Care – PPO | Admitting: Family Medicine

## 2013-11-02 VITALS — BP 122/82 | HR 82 | Temp 98.4°F | Ht 71.0 in | Wt 188.1 lb

## 2013-11-02 DIAGNOSIS — R7309 Other abnormal glucose: Secondary | ICD-10-CM

## 2013-11-02 DIAGNOSIS — Z23 Encounter for immunization: Secondary | ICD-10-CM

## 2013-11-02 DIAGNOSIS — N529 Male erectile dysfunction, unspecified: Secondary | ICD-10-CM

## 2013-11-02 DIAGNOSIS — I10 Essential (primary) hypertension: Secondary | ICD-10-CM

## 2013-11-02 DIAGNOSIS — K219 Gastro-esophageal reflux disease without esophagitis: Secondary | ICD-10-CM

## 2013-11-02 DIAGNOSIS — E785 Hyperlipidemia, unspecified: Secondary | ICD-10-CM

## 2013-11-02 DIAGNOSIS — R739 Hyperglycemia, unspecified: Secondary | ICD-10-CM

## 2013-11-02 MED ORDER — SILDENAFIL CITRATE 100 MG PO TABS: 100.0000 mg | ORAL_TABLET | ORAL | Status: DC | PRN

## 2013-11-02 MED ORDER — PNEUMOCOCCAL 13-VAL CONJ VACC IM SUSP: 0.5000 mL | Freq: Once | INTRAMUSCULAR | Status: DC

## 2013-11-02 NOTE — Assessment & Plan Note (Signed)
Well controlled on current meds no changes 

## 2013-11-02 NOTE — Progress Notes (Signed)
Patient ID: ELDRA WORD, male   DOB: 19-May-1951, 63 y.o.   MRN: [id number] [facility] [id number] 1950-11-26 11/02/2013      Progress Note-Follow Up  Subjective  Chief Complaint  Chief Complaint  Patient presents with  . Follow-up    6 month  . Injections    prevnar    HPI  Patient is a 63 year old male in today for routine follow up. Feeling well. No changes or recent illness. Taking meds as prescribed. No HA/CP/palp/SOB/GI or GU concerns. Trying to eat a heart healthy diet, denies polyuria or polydipsia  Past Medical History  Diagnosis Date  . Allergic rhinitis   . Hyperlipidemia   . Hypertension   . History of carpal tunnel syndrome   . Ankle fracture 1998  . OA (osteoarthritis) 02/06/2013    knees  . Hammer toe of right foot 05/06/2013    Past Surgical History  Procedure Laterality Date  . Inguinal hernia repair  2008  . Colonoscopy  08/04/2002    Normal Exam- Dr Henrene Pastor    Family History  Problem Relation Age of Onset  . Hyperlipidemia    . Hypertension    . Coronary artery disease Father     aortic valve disease  . Heart attack Father 55  . COPD Father   . Hypertension Father   . Hyperlipidemia Father   . Dementia Father   . Diabetes type II Maternal Grandfather   . Arthritis Mother   . Diabetes Sister   . Diabetes Brother     History   Social History  . Marital Status: Married    Spouse Name: N/A    Number of Children: N/A  . Years of Education: N/A   Occupational History  . Not on file.   Social History Main Topics  . Smoking status: Former Research scientist (life sciences)  . Smokeless tobacco: Never Used     Comment: 5-10 pack year history  . Alcohol Use: Yes     Comment: 2-3 glasses of wine  . Drug Use: Not on file  . Sexual Activity: Yes   Other Topics Concern  . 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 amLODipine (NORVASC) 5 MG tablet Take 1 tablet (5 mg total) by mouth daily.  90 tablet  3  . aspirin 81 MG tablet Take 81 mg by mouth daily.        . meloxicam (MOBIC) 15 MG tablet Take 1 tablet (15 mg total) by mouth daily as needed for pain (with food).  30 tablet  3  . Multiple Vitamin (MULTIVITAMIN) tablet Take 1 tablet by mouth daily.        Marland Kitchen omeprazole (PRILOSEC) 20 MG capsule TAKE ONE CAPSULE BY MOUTH EVERY DAY 15 MINUTES BEFORE AM MEAL AS NEEDED  90 capsule  3  . quinapril-hydrochlorothiazide (ACCURETIC) 20-12.5 MG per tablet Take 1 tablet by mouth daily.  90 tablet  3  . sildenafil (VIAGRA) 100 MG tablet 1 tablet by mouth once a day as needed      . simvastatin (ZOCOR) 20 MG tablet Take 1 tablet (20 mg total) by mouth at  bedtime.  90 tablet  3  . VIAGRA 100 MG tablet TAKE ONE TABLET BY MOUTH EVERY DAY AS NEEDED  10 tablet  0   No current facility-administered medications on file prior to visit.    Allergies  Allergen Reactions  . Penicillins     REACTION: swelling , irriation    Review of Systems  Review of Systems  Constitutional: Negative for fever and malaise/fatigue.  HENT: Negative for congestion.   Eyes: Negative for discharge.  Respiratory: Negative for shortness of breath.   Cardiovascular: Negative for chest pain, palpitations and leg swelling.  Gastrointestinal: Negative for nausea, abdominal pain and diarrhea.  Genitourinary: Negative for dysuria.  Musculoskeletal: Negative for falls.  Skin: Negative for rash.  Neurological: Negative for loss of consciousness and headaches.  Endo/Heme/Allergies: Negative for polydipsia.  Psychiatric/Behavioral: Negative for depression and suicidal ideas. The patient is not nervous/anxious and does not have insomnia.     Objective  BP 122/82  Pulse 82   Temp(Src) 98.4 F (36.9 C) (Oral)  Ht 5\' 11"  (1.803 m)  Wt 188 lb 1.9 oz (85.331 kg)  BMI 26.25 kg/m2  SpO2 96%  Physical Exam  Physical Exam  Constitutional: He is oriented to person, place, and time and well-developed, well-nourished, and in no distress. No distress.  HENT:  Head: Normocephalic and atraumatic.  Eyes: Conjunctivae are normal.  Neck: Neck supple. No thyromegaly present.  Cardiovascular: Normal rate, regular rhythm and normal heart sounds.   No murmur heard. Pulmonary/Chest: Effort normal and breath sounds normal. No respiratory distress.  Abdominal: He exhibits no distension and no mass. There is no tenderness.  Musculoskeletal: He exhibits no edema.  Neurological: He is alert and oriented to person, place, and time.  Skin: Skin is warm.  Psychiatric: Memory, affect and judgment normal.    Lab Results  Component Value Date   TSH 1.21 04/28/2013   Lab Results  Component Value Date   WBC 6.4 10/26/2013   HGB 15.7 10/26/2013   HCT 46.2 10/26/2013   MCV 94.9 10/26/2013   PLT 245 10/26/2013   Lab Results  Component Value Date   CREATININE 0.85 10/26/2013   BUN 11 10/26/2013   NA 135 10/26/2013   K 4.0 10/26/2013   CL 98 10/26/2013   CO2 28 10/26/2013   Lab Results  Component Value Date   ALT 20 10/26/2013   AST 26 10/26/2013   ALKPHOS 58 10/26/2013   BILITOT 0.9 10/26/2013   Lab Results  Component Value Date   CHOL 154 10/26/2013   Lab Results  Component Value Date   HDL 63 10/26/2013   Lab Results  Component Value Date   LDLCALC 71 10/26/2013   Lab Results  Component Value Date   TRIG 101 10/26/2013   Lab Results  Component Value Date   CHOLHDL 2.4 10/26/2013     Assessment & Plan  HYPERLIPIDEMIA Well controlled on Simvastatin, continue same avoid trans fats  HYPERTENSION Well controlled on current meds no changes  GERD Add a probiotic, may titrate down Omeprazole to prn use as tolerated, avoid offending foods  Hyperglycemia hgba1c 6.0  encouraged to minimize carbs use complex carbs, discussed carb counting. Recheck in 3 months

## 2013-11-02 NOTE — Patient Instructions (Signed)
Probiotics such as Digestive Advantage, Florastor, Align, Culturelle daily  Basic Carbohydrate Counting Basic carbohydrate counting is a way to plan meals. It is done by counting the amount of carbohydrate in foods. Foods that have carbohydrates are starches (grains, beans, starchy vegetables) and sweets. Eating carbohydrates increases blood glucose (sugar) levels. People with diabetes use carbohydrate counting to help keep their blood glucose at a normal level.  COUNTING CARBOHYDRATES IN FOODS The first step in counting carbohydrates is to learn how many carbohydrate servings you should have in every meal. A dietitian can plan this for you. After learning the amount of carbohydrates to include in your meal plan, you can start to choose the carbohydrate-containing foods you want to eat.  There are 2 ways to identify the amount of carbohydrates in the foods you eat.  Read the Nutrition Facts panel on food labels. You need 2 pieces of information from the Nutrition Facts panel to count carbohydrates this way:  Serving size.  Total carbohydrate (in grams). Decide how many servings you will be eating. If it is 1 serving, you will be eating the amount of carbohydrate listed on the panel. If you will be eating 2 servings, you will be eating double the amount of carbohydrate listed on the panel.   Learn serving sizes. A serving size of most carbohydrate-containing foods is about 15 grams (g). Listed below are single serving sizes of common carbohydrate-containing foods:  1 slice bread.   cup unsweetened, dry cereal.   cup hot cereal.   cup rice.   cup mashed potatoes.   cup pasta.  1 cup fresh fruit.   cup canned fruit.  1 cup milk (whole, 2%, or skim).   cup starchy vegetables (peas, corn, or potatoes). Counting carbohydrates this way is similar to looking on the Nutrition Facts panel. Decide how many servings you will eat first. Multiply the number of servings you eat by 15 g. For  example, if you have 2 cups of strawberries, you had 2 servings. That means you had 30 g of carbohydrate (2 servings x 15 g = 30 g). CALCULATING CARBOHYDRATES IN A MEAL Sample dinner  3 oz chicken breast.   cup brown rice.   cup corn.  1 cup fat-free milk.  1 cup strawberries with sugar-free whipped topping. Carbohydrate calculation First, identify the foods that contain carbohydrate:  Rice.  Corn.  Milk.  Strawberries. Calculate the number of servings eaten:  2 servings rice.  1 serving corn.  1 serving milk.  1 serving strawberries. Multiply the number of servings by 15 g:  2 servings rice x 15 g = 30 g.  1 serving corn x 15 g = 15 g.  1 serving milk x 15 g = 15 g.  1 serving strawberries x 15 g = 15 g. Add the amounts to find the total carbohydrates eaten: 30 g + 15 g + 15 g + 15 g = 75 g carbohydrate eaten at dinner. Document Released: 09/29/2005 Document Revised: 12/22/2011 Document Reviewed: 08/15/2011 [facility] Patient Information 2014 [place], [place].

## 2013-11-02 NOTE — Assessment & Plan Note (Signed)
Well controlled on Simvastatin, continue same avoid trans fats

## 2013-11-02 NOTE — Assessment & Plan Note (Signed)
Add a probiotic, may titrate down Omeprazole to prn use as tolerated, avoid offending foods

## 2013-11-02 NOTE — Progress Notes (Signed)
Pre visit review using our clinic review tool, if applicable. No additional management support is needed unless otherwise documented below in the visit note. 

## 2013-11-02 NOTE — Assessment & Plan Note (Signed)
hgba1c 6.0 encouraged to minimize carbs use complex carbs, discussed carb counting. Recheck in 3 months

## 2013-11-03 ENCOUNTER — Telehealth: Payer: Self-pay | Admitting: Family Medicine

## 2013-11-03 NOTE — Telephone Encounter (Signed)
Relevant patient education assigned to patient using Emmi. ° °

## 2013-11-07 ENCOUNTER — Telehealth: Payer: Self-pay | Admitting: Family Medicine

## 2013-11-07 NOTE — Telephone Encounter (Signed)
Refill- viagra 100mg  tab. Take one tablet by mouth every day as needed. Qty 10 last fill 10.21.14

## 2013-11-08 ENCOUNTER — Other Ambulatory Visit: Payer: Self-pay

## 2013-11-08 MED ORDER — SILDENAFIL CITRATE 100 MG PO TABS
100.0000 mg | ORAL_TABLET | ORAL | Status: DC | PRN
Start: 2013-11-08 — End: 2014-02-16

## 2013-11-08 NOTE — Telephone Encounter (Signed)
OK to refill Viagra #10 with1 rf

## 2013-11-08 NOTE — Telephone Encounter (Signed)
Please advise Viagra refill?  Last RX was done on 05-02-13 quantity 10 with 0 refills

## 2013-11-08 NOTE — Telephone Encounter (Signed)
See note below

## 2013-11-08 NOTE — Addendum Note (Signed)
Addended by: Varney Daily on: 11/08/2013 08:43 AM   Modules accepted: Orders

## 2013-11-08 NOTE — Telephone Encounter (Signed)
Please advise viagra refill?  Last RX was done on 05-02-13 quantity 10 with 0 refills

## 2013-11-09 MED ORDER — SILDENAFIL CITRATE 100 MG PO TABS: ORAL_TABLET | ORAL | Status: DC

## 2013-11-09 NOTE — Addendum Note (Signed)
Addended by: Rockwell Germany on: 11/09/2013 10:32 AM   Modules accepted: Orders

## 2013-11-09 NOTE — Telephone Encounter (Signed)
Rx request to pharmacy/SLS  

## 2014-01-31 ENCOUNTER — Ambulatory Visit: Payer: BC Managed Care – PPO | Admitting: Family Medicine

## 2014-02-16 ENCOUNTER — Encounter: Payer: Self-pay | Admitting: Family Medicine

## 2014-02-16 ENCOUNTER — Ambulatory Visit (INDEPENDENT_AMBULATORY_CARE_PROVIDER_SITE_OTHER): Payer: BC Managed Care – PPO | Admitting: Family Medicine

## 2014-02-16 VITALS — BP 138/82 | HR 71 | Temp 98.3°F | Ht 71.0 in | Wt 187.1 lb

## 2014-02-16 DIAGNOSIS — I10 Essential (primary) hypertension: Secondary | ICD-10-CM

## 2014-02-16 DIAGNOSIS — R7309 Other abnormal glucose: Secondary | ICD-10-CM

## 2014-02-16 DIAGNOSIS — R739 Hyperglycemia, unspecified: Secondary | ICD-10-CM

## 2014-02-16 DIAGNOSIS — E785 Hyperlipidemia, unspecified: Secondary | ICD-10-CM

## 2014-02-16 LAB — HEPATIC FUNCTION PANEL
ALT: 20 U/L (ref 0–53)
AST: 21 U/L (ref 0–37)
Albumin: 4.6 g/dL (ref 3.5–5.2)
Alkaline Phosphatase: 49 U/L (ref 39–117)
BILIRUBIN DIRECT: 0.2 mg/dL (ref 0.0–0.3)
BILIRUBIN INDIRECT: 0.9 mg/dL (ref 0.2–1.2)
BILIRUBIN TOTAL: 1.1 mg/dL (ref 0.2–1.2)
Total Protein: 7.3 g/dL (ref 6.0–8.3)

## 2014-02-16 LAB — CBC
HEMATOCRIT: 46.5 % (ref 39.0–52.0)
HEMOGLOBIN: 16 g/dL (ref 13.0–17.0)
MCH: 32.1 pg (ref 26.0–34.0)
MCHC: 34.4 g/dL (ref 30.0–36.0)
MCV: 93.4 fL (ref 78.0–100.0)
Platelets: 218 10*3/uL (ref 150–400)
RBC: 4.98 MIL/uL (ref 4.22–5.81)
RDW: 13.4 % (ref 11.5–15.5)
WBC: 7.2 10*3/uL (ref 4.0–10.5)

## 2014-02-16 LAB — HEMOGLOBIN A1C
Hgb A1c MFr Bld: 5.7 % — ABNORMAL HIGH (ref <5.7)
Mean Plasma Glucose: 117 mg/dL — ABNORMAL HIGH (ref <117)

## 2014-02-16 LAB — RENAL FUNCTION PANEL
Albumin: 4.6 g/dL (ref 3.5–5.2)
BUN: 15 mg/dL (ref 6–23)
CHLORIDE: 99 meq/L (ref 96–112)
CO2: 27 mEq/L (ref 19–32)
Calcium: 9.3 mg/dL (ref 8.4–10.5)
Creat: 0.87 mg/dL (ref 0.50–1.35)
Glucose, Bld: 108 mg/dL — ABNORMAL HIGH (ref 70–99)
PHOSPHORUS: 2.9 mg/dL (ref 2.3–4.6)
POTASSIUM: 4 meq/L (ref 3.5–5.3)
Sodium: 137 mEq/L (ref 135–145)

## 2014-02-16 LAB — LIPID PANEL
CHOLESTEROL: 189 mg/dL (ref 0–200)
HDL: 68 mg/dL (ref >39)
LDL Cholesterol: 102 mg/dL — ABNORMAL HIGH (ref 0–99)
Total CHOL/HDL Ratio: 2.8 Ratio
Triglycerides: 97 mg/dL (ref <150)
VLDL: 19 mg/dL (ref 0–40)

## 2014-02-16 MED ORDER — SILDENAFIL CITRATE 100 MG PO TABS: 100.0000 mg | ORAL_TABLET | ORAL | Status: DC | PRN

## 2014-02-16 NOTE — Progress Notes (Signed)
Pre visit review using our clinic review tool, if applicable. No additional management support is needed unless otherwise documented below in the visit note. 

## 2014-02-16 NOTE — Patient Instructions (Signed)
Add PSA  DASH Diet The DASH diet stands for "Dietary Approaches to Stop Hypertension." It is a healthy eating plan that has been shown to reduce high blood pressure (hypertension) in as little as 14 days, while also possibly providing other significant health benefits. These other health benefits include reducing the risk of breast cancer after menopause and reducing the risk of type 2 diabetes, heart disease, colon cancer, and stroke. Health benefits also include weight loss and slowing kidney failure in patients with chronic kidney disease.  DIET GUIDELINES  Limit salt (sodium). Your diet should contain less than 1500 mg of sodium daily.  Limit refined or processed carbohydrates. Your diet should include mostly whole grains. Desserts and added sugars should be used sparingly.  Include small amounts of heart-healthy fats. These types of fats include nuts, oils, and tub margarine. Limit saturated and trans fats. These fats have been shown to be harmful in the body. CHOOSING FOODS  The following food groups are based on a 2000 calorie diet. See your Registered Dietitian for individual calorie needs. Grains and Grain Products (6 to 8 servings daily)  Eat More Often: Whole-wheat bread, brown rice, whole-grain or wheat pasta, quinoa, popcorn without added fat or salt (air popped).  Eat Less Often: White bread, white pasta, white rice, cornbread. Vegetables (4 to 5 servings daily)  Eat More Often: Fresh, frozen, and canned vegetables. Vegetables may be raw, steamed, roasted, or grilled with a minimal amount of fat.  Eat Less Often/Avoid: Creamed or fried vegetables. Vegetables in a cheese sauce. Fruit (4 to 5 servings daily)  Eat More Often: All fresh, canned (in natural juice), or frozen fruits. Dried fruits without added sugar. One hundred percent fruit juice ( cup [237 mL] daily).  Eat Less Often: Dried fruits with added sugar. Canned fruit in light or heavy syrup. [employer], Fish, and  Poultry (2 servings or less daily. One serving is 3 to 4 oz [85-114 g]).  Eat More Often: Ninety percent or leaner ground beef, tenderloin, sirloin. Round cuts of beef, chicken breast, [place] breast. All fish. Grill, bake, or broil your meat. Nothing should be fried.  Eat Less Often/Avoid: Fatty cuts of meat, [place], or chicken leg, thigh, or wing. Fried cuts of meat or fish. Dairy (2 to 3 servings)  Eat More Often: Low-fat or fat-free milk, low-fat plain or light yogurt, reduced-fat or part-skim cheese.  Eat Less Often/Avoid: Milk (whole, 2%).Whole milk yogurt. Full-fat cheeses. Nuts, Seeds, and Legumes (4 to 5 servings per week)  Eat More Often: All without added salt.  Eat Less Often/Avoid: Salted nuts and seeds, canned beans with added salt. Fats and Sweets (limited)  Eat More Often: Vegetable oils, tub margarines without trans fats, sugar-free gelatin. Mayonnaise and salad dressings.  Eat Less Often/Avoid: Coconut oils, palm oils, butter, stick margarine, cream, half and half, cookies, candy, pie. FOR MORE INFORMATION The Dash Diet Eating Plan: www.dashdiet.org Document Released: 09/18/2011 Document Revised: 12/22/2011 Document Reviewed: 09/18/2011 [facility] Patient Information 2014 [place], [place].

## 2014-02-19 NOTE — Assessment & Plan Note (Signed)
hgba1c acceptable, minimize simple carbs. Increase exercise as tolerated.  

## 2014-02-19 NOTE — Assessment & Plan Note (Signed)
Well controlled, no changes to meds. Encouraged heart healthy diet such as the DASH diet and exercise as tolerated.  °

## 2014-02-19 NOTE — Progress Notes (Signed)
Patient ID: William Phillips, male   DOB: 09-Sep-1951, 63 y.o.   MRN: [id number] William Phillips [id number] May 04, 1951 02/19/2014      Progress Note-Follow Up  Subjective  Chief Complaint  Chief Complaint  Patient presents with  . Follow-up    3 month    HPI  Patient is a 63 year old male in today for routine medical care. Doing well, no recent illness. Has been struggling with some pain in his toe from an inflamed hammertoe but otherwise offers no acute complaints.  Past Medical History  Diagnosis Date  . Allergic rhinitis   . Hyperlipidemia   . Hypertension   . History of carpal tunnel syndrome   . Ankle fracture 1998  . OA (osteoarthritis) 02/06/2013    knees  . Hammer toe of right foot 05/06/2013    Past Surgical History  Procedure Laterality Date  . Inguinal hernia repair  2008  . Colonoscopy  08/04/2002    Normal Exam- Dr Henrene Pastor    Family History  Problem Relation Age of Onset  . Hyperlipidemia    . Hypertension    . Coronary artery disease Father     aortic valve disease  . Heart attack Father 48  . COPD Father   . Hypertension Father   . Hyperlipidemia Father   . Dementia Father   . Diabetes type II Maternal Grandfather   . Arthritis Mother   . Diabetes Sister   . Diabetes Brother     History   Social History  . Marital Status: Married    Spouse Name: N/A    Number of Children: N/A  . Years of Education: N/A   Occupational History  . Not on file.   Social History Main Topics  . Smoking status: Former Research scientist (life sciences)  . Smokeless tobacco: Never Used     Comment: 5-10 pack year history  . Alcohol Use: Yes     Comment: 2-3 glasses of wine  . Drug Use: Not on file  . Sexual Activity: Yes   Other Topics Concern  . 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 amLODipine (NORVASC) 5 MG tablet Take 1 tablet (5 mg total) by mouth daily.  90 tablet  3  . aspirin 81 MG tablet Take 81 mg by mouth daily.        . meloxicam (MOBIC) 15 MG tablet Take 1 tablet (15 mg total) by mouth daily as needed for pain (with food).  30 tablet  3  . Multiple Vitamin (MULTIVITAMIN) tablet Take 1 tablet by mouth daily.        Marland Kitchen omeprazole (PRILOSEC) 20 MG capsule TAKE ONE CAPSULE BY MOUTH EVERY DAY 15 MINUTES BEFORE AM MEAL AS NEEDED  90 capsule  3  . quinapril-hydrochlorothiazide (ACCURETIC) 20-12.5 MG per tablet Take 1 tablet by mouth daily.  90 tablet  3  . sildenafil (VIAGRA) 100 MG tablet Take 1 tablet (100 mg total) by mouth as needed for erectile dysfunction. 1 tablet by mouth once a day as needed  10 tablet  1  . simvastatin (ZOCOR) 20 MG tablet Take 1 tablet (20  mg total) by mouth at bedtime.  90 tablet  3   No current facility-administered medications on file prior to visit.    Allergies  Allergen Reactions  . Penicillins     REACTION: swelling , irriation    Review of Systems  Review of Systems  Constitutional: Negative for fever and malaise/fatigue.  HENT: Negative for congestion.   Eyes: Negative for discharge.  Respiratory: Negative for shortness of breath.   Cardiovascular: Negative for chest pain, palpitations and leg swelling.  Gastrointestinal: Negative for nausea, abdominal pain and diarrhea.  Genitourinary: Negative for dysuria.  Musculoskeletal: Negative for falls.  Skin: Negative for rash.  Neurological: Negative for loss of consciousness and headaches.  Endo/Heme/Allergies: Negative for polydipsia.  Psychiatric/Behavioral: Negative for depression and suicidal ideas. The patient is not nervous/anxious and does not have insomnia.     Objective  BP 138/82  Pulse 71  Temp(Src) 98.3 F (36.8 C) (Oral)  Ht 5\' 11"   (1.803 m)  Wt 187 lb 1.9 oz (84.877 kg)  BMI 26.11 kg/m2  SpO2 98%  Physical Exam  Physical Exam  Nursing note and vitals reviewed. Constitutional: He is oriented to person, place, and time. He appears well-developed and well-nourished.  HENT:  Head: Normocephalic and atraumatic.  Eyes: Pupils are equal, round, and reactive to light.  Neck: Normal range of motion. Neck supple.  Cardiovascular: Normal rate, regular rhythm and normal heart sounds.   No murmur heard. Pulmonary/Chest: Effort normal and breath sounds normal. No respiratory distress. He has no wheezes. He has no rales.  Abdominal: Soft. Bowel sounds are normal. He exhibits no distension.  Musculoskeletal: Normal range of motion. He exhibits no edema and no tenderness.  Neurological: He is alert and oriented to person, place, and time.    Lab Results  Component Value Date   TSH 1.21 04/28/2013   Lab Results  Component Value Date   WBC 7.2 02/16/2014   HGB 16.0 02/16/2014   HCT 46.5 02/16/2014   MCV 93.4 02/16/2014   PLT 218 02/16/2014   Lab Results  Component Value Date   CREATININE 0.87 02/16/2014   BUN 15 02/16/2014   NA 137 02/16/2014   K 4.0 02/16/2014   CL 99 02/16/2014   CO2 27 02/16/2014   Lab Results  Component Value Date   ALT 20 02/16/2014   AST 21 02/16/2014   ALKPHOS 49 02/16/2014   BILITOT 1.1 02/16/2014   Lab Results  Component Value Date   CHOL 189 02/16/2014   Lab Results  Component Value Date   HDL 68 02/16/2014   Lab Results  Component Value Date   LDLCALC 102* 02/16/2014   Lab Results  Component Value Date   TRIG 97 02/16/2014   Lab Results  Component Value Date   CHOLHDL 2.8 02/16/2014     Assessment & Plan  HYPERTENSION Well controlled, no changes to meds. Encouraged heart healthy diet such as the DASH diet and exercise as tolerated.   HYPERLIPIDEMIA Tolerating statin, encouraged heart healthy diet, avoid trans fats, minimize simple carbs and saturated fats. Increase exercise as  tolerated  Hyperglycemia hgba1c acceptable, minimize simple carbs. Increase exercise as tolerated.

## 2014-02-19 NOTE — Assessment & Plan Note (Signed)
Tolerating statin, encouraged heart healthy diet, avoid trans fats, minimize simple carbs and saturated fats. Increase exercise as tolerated 

## 2014-03-30 ENCOUNTER — Encounter: Payer: Self-pay | Admitting: Family Medicine

## 2014-04-19 ENCOUNTER — Other Ambulatory Visit: Payer: Self-pay | Admitting: Family Medicine

## 2014-06-20 ENCOUNTER — Ambulatory Visit (INDEPENDENT_AMBULATORY_CARE_PROVIDER_SITE_OTHER): Payer: BC Managed Care – PPO | Admitting: Family Medicine

## 2014-06-20 ENCOUNTER — Encounter: Payer: Self-pay | Admitting: Family Medicine

## 2014-06-20 VITALS — BP 120/78 | HR 77 | Temp 97.9°F | Ht 71.0 in | Wt 183.0 lb

## 2014-06-20 DIAGNOSIS — M19019 Primary osteoarthritis, unspecified shoulder: Secondary | ICD-10-CM

## 2014-06-20 DIAGNOSIS — R7309 Other abnormal glucose: Secondary | ICD-10-CM

## 2014-06-20 DIAGNOSIS — M19012 Primary osteoarthritis, left shoulder: Secondary | ICD-10-CM

## 2014-06-20 DIAGNOSIS — E785 Hyperlipidemia, unspecified: Secondary | ICD-10-CM

## 2014-06-20 DIAGNOSIS — I10 Essential (primary) hypertension: Secondary | ICD-10-CM

## 2014-06-20 DIAGNOSIS — K219 Gastro-esophageal reflux disease without esophagitis: Secondary | ICD-10-CM

## 2014-06-20 DIAGNOSIS — R739 Hyperglycemia, unspecified: Secondary | ICD-10-CM

## 2014-06-20 LAB — RENAL FUNCTION PANEL
Albumin: 4.4 g/dL (ref 3.5–5.2)
BUN: 12 mg/dL (ref 6–23)
CO2: 26 meq/L (ref 19–32)
Calcium: 9.6 mg/dL (ref 8.4–10.5)
Chloride: 102 mEq/L (ref 96–112)
Creatinine, Ser: 0.9 mg/dL (ref 0.4–1.5)
GFR: 86.38 mL/min (ref >60.00)
GLUCOSE: 106 mg/dL — AB (ref 70–99)
Phosphorus: 3.2 mg/dL (ref 2.3–4.6)
Potassium: 3.7 mEq/L (ref 3.5–5.1)
Sodium: 139 mEq/L (ref 135–145)

## 2014-06-20 LAB — CBC
HEMATOCRIT: 47.7 % (ref 39.0–52.0)
Hemoglobin: 15.9 g/dL (ref 13.0–17.0)
MCHC: 33.4 g/dL (ref 30.0–36.0)
MCV: 95.2 fl (ref 78.0–100.0)
Platelets: 259 10*3/uL (ref 150.0–400.0)
RBC: 5.01 Mil/uL (ref 4.22–5.81)
RDW: 12.8 % (ref 11.5–15.5)
WBC: 13 10*3/uL — ABNORMAL HIGH (ref 4.0–10.5)

## 2014-06-20 LAB — LIPID PANEL
Cholesterol: 178 mg/dL (ref 0–200)
HDL: 64.3 mg/dL (ref >39.00)
LDL Cholesterol: 92 mg/dL (ref 0–99)
NONHDL: 113.7
Total CHOL/HDL Ratio: 3
Triglycerides: 111 mg/dL (ref 0.0–149.0)
VLDL: 22.2 mg/dL (ref 0.0–40.0)

## 2014-06-20 LAB — HEPATIC FUNCTION PANEL
ALBUMIN: 4.4 g/dL (ref 3.5–5.2)
ALT: 17 U/L (ref 0–53)
AST: 21 U/L (ref 0–37)
Alkaline Phosphatase: 52 U/L (ref 39–117)
Bilirubin, Direct: 0 mg/dL (ref 0.0–0.3)
TOTAL PROTEIN: 8 g/dL (ref 6.0–8.3)
Total Bilirubin: 0.9 mg/dL (ref 0.2–1.2)

## 2014-06-20 LAB — HEMOGLOBIN A1C: Hgb A1c MFr Bld: 5.9 % (ref 4.6–6.5)

## 2014-06-20 LAB — TSH: TSH: 0.34 u[IU]/mL — AB (ref 0.35–4.50)

## 2014-06-20 NOTE — Assessment & Plan Note (Signed)
Well controlled, no changes to meds. Encouraged heart healthy diet such as the DASH diet and exercise as tolerated.  °

## 2014-06-20 NOTE — Assessment & Plan Note (Signed)
Tolerating statin, encouraged heart healthy diet, avoid trans fats, minimize simple carbs and saturated fats. Increase exercise as tolerated. Check lipids today

## 2014-06-20 NOTE — Assessment & Plan Note (Signed)
Avoid offending foods, start probiotics. Do not eat large meals in late evening and consider raising head of bed. Has titrated down his Omeprazole to QOD without flares, will try every 3rd day now and reassess. Consider prn use or Zantac prn

## 2014-06-20 NOTE — Assessment & Plan Note (Signed)
Left shoulder pain x 1 month, no acute injury, no radicular symptoms. Pain is anterior and better with activity and worse with prolonged rest. Will try Salon pas twice daily and heat twice a day, refer to sports med if persists

## 2014-06-20 NOTE — Patient Instructions (Addendum)
Salon Pas gel or patches twice daily, heat twice daily and consider ice alternating Call if not improving for referral  Bursitis Bursitis is a swelling and soreness (inflammation) of a fluid-filled sac (bursa) that overlies and protects a joint. It can be caused by injury, overuse of the joint, arthritis or infection. The joints most likely to be affected are the elbows, shoulders, hips and knees. HOME CARE INSTRUCTIONS   Apply ice to the affected area for 15-20 minutes each hour while awake for 2 days. Put the ice in a plastic bag and place a towel between the bag of ice and your skin.  Rest the injured joint as much as possible, but continue to put the joint through a full range of motion, 4 times per day. (The shoulder joint especially becomes rapidly "frozen" if not used.) When the pain lessens, begin normal slow movements and usual activities.  Only take over-the-counter or prescription medicines for pain, discomfort or fever as directed by your caregiver.  Your caregiver may recommend draining the bursa and injecting medicine into the bursa. This may help the healing process.  Follow all instructions for follow-up with your caregiver. This includes any orthopedic referrals, physical therapy and rehabilitation. Any delay in obtaining necessary care could result in a delay or failure of the bursitis to heal and chronic pain. SEEK IMMEDIATE MEDICAL CARE IF:   Your pain increases even during treatment.  You develop an oral temperature above 102 F (38.9 C) and have heat and inflammation over the involved bursa. MAKE SURE YOU:   Understand these instructions.  Will watch your condition.  Will get help right away if you are not doing well or get worse. Document Released: 09/26/2000 Document Revised: 12/22/2011 Document Reviewed: 12/19/2013 [facility] Patient Information 2015 [place], [place]. This information is not intended to replace advice given to you by your health care provider. Make  sure you discuss any questions you have with your health care provider.

## 2014-06-20 NOTE — Assessment & Plan Note (Signed)
A1C improved at last check will repeat today. hgba1c acceptable, minimize simple carbs. Increase exercise as tolerated.

## 2014-06-20 NOTE — Progress Notes (Signed)
Pre visit review using our clinic review tool, if applicable. No additional management support is needed unless otherwise documented below in the visit note. 

## 2014-06-20 NOTE — Progress Notes (Signed)
Patient ID: William Phillips, male   DOB: 09-21-1951, 63 y.o.   MRN: [id number] William Phillips [id number] September 12, 1951 06/20/2014      Progress Note-Follow Up  Subjective  Chief Complaint  Chief Complaint  Patient presents with  . Follow-up    4 month    HPI  Patient is a 63 year old male in today for routine medical care. Patient in today for routine followup. Feeling well. Nose he has been able to titrate his omeprazole down to QOD without any significant flare. Feels well. Is going to try going to every third day. No recent illness but does note some recent trouble with his left shoulder. Says it's been occurring for about one month. It is worse with prolonged inactivity and better when he stays active. The pain is anterior. He denies any acute injury or mechanism. No radicular symptoms. Does note he has been somewhat helpful. Denies CP/palp/SOB/HA/congestion/fevers/GI or GU c/o. Taking meds as prescribed  Past Medical History  Diagnosis Date  . Allergic rhinitis   . Hyperlipidemia   . Hypertension   . History of carpal tunnel syndrome   . Ankle fracture 1998  . OA (osteoarthritis) 02/06/2013    knees  . Hammer toe of right foot 05/06/2013    Past Surgical History  Procedure Laterality Date  . Inguinal hernia repair  2008  . Colonoscopy  08/04/2002    Normal Exam- Dr Henrene Pastor    Family History  Problem Relation Age of Onset  . Hyperlipidemia    . Hypertension    . Coronary artery disease Father     aortic valve disease  . Heart attack Father 44  . COPD Father   . Hypertension Father   . Hyperlipidemia Father   . Dementia Father   . Diabetes type II Maternal Grandfather   . Arthritis Mother   . Diabetes Sister   . Diabetes Brother     History   Social History  . Marital Status: Married    Spouse Name: N/A    Number of Children: N/A  . Years of Education: N/A   Occupational History  . Not on file.   Social History Main Topics  . Smoking status: Former  Research scientist (life sciences)  . Smokeless tobacco: Never Used     Comment: 5-10 pack year history  . Alcohol Use: Yes     Comment: 2-3 glasses of wine  . Drug Use: Not on file  . Sexual Activity: Yes   Other Topics Concern  . 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 amLODipine (NORVASC) 5 MG tablet Take 1 tablet (5 mg total) by mouth daily.  90 tablet  3  . aspirin 81 MG tablet Take 81 mg by mouth daily.        . Multiple Vitamin (MULTIVITAMIN) tablet Take 1 tablet by mouth daily.        Marland Kitchen omeprazole (PRILOSEC) 20 MG capsule TAKE ONE CAPSULE BY MOUTH EVERY DAY 15 MINUTES BEFORE MORNING MEAL AS NEEDED.  90 capsule  1  . quinapril-hydrochlorothiazide (ACCURETIC) 20-12.5 MG per tablet Take 1 tablet by mouth daily.  90 tablet  3  . sildenafil (VIAGRA) 100 MG tablet Take 1 tablet (100 mg total) by mouth as needed for erectile dysfunction. 1 tablet by mouth once a day as needed  10 tablet  1  . sildenafil (VIAGRA) 100 MG tablet Take 1 tablet (100 mg total) by mouth as needed for erectile dysfunction.  10 tablet  2  . simvastatin (ZOCOR) 20 MG tablet Take 1 tablet (20 mg total) by mouth at bedtime.  90 tablet  3   No current facility-administered medications on file prior to visit.    Allergies  Allergen Reactions  . Penicillins     REACTION: swelling , irriation    Review of Systems  Review of Systems  Constitutional: Negative for fever and malaise/fatigue.  HENT: Negative for congestion.   Eyes: Negative for discharge.  Respiratory: Negative for shortness of breath.   Cardiovascular: Negative for chest pain, palpitations and leg swelling.  Gastrointestinal: Negative for nausea, abdominal  pain and diarrhea.  Genitourinary: Negative for dysuria.  Musculoskeletal: Positive for joint pain. Negative for falls.       Left anterior shoulder pain intermittent  Skin: Negative for rash.  Neurological: Negative for loss of consciousness and headaches.  Endo/Heme/Allergies: Negative for polydipsia.  Psychiatric/Behavioral: Negative for depression and suicidal ideas. The patient is not nervous/anxious and does not have insomnia.     Objective  BP 120/78  Pulse 77  Temp(Src) 97.9 F (36.6 C) (Oral)  Ht 5\' 11"  (1.803 m)  Wt 183 lb (83.008 kg)  BMI 25.53 kg/m2  SpO2 99%  Physical Exam  Physical Exam  Constitutional: He is oriented to person, place, and time and well-developed, well-nourished, and in no distress. No distress.  HENT:  Head: Normocephalic and atraumatic.  Eyes: Conjunctivae are normal.  Neck: Neck supple. No thyromegaly present.  Cardiovascular: Normal rate, regular rhythm and normal heart sounds.   No murmur heard. Pulmonary/Chest: Effort normal and breath sounds normal. No respiratory distress.  Abdominal: He exhibits no distension and no mass. There is no tenderness.  Musculoskeletal: He exhibits no edema.  Neurological: He is alert and oriented to person, place, and time.  Skin: Skin is warm.  Psychiatric: Memory, affect and judgment normal.    Lab Results  Component Value Date   TSH 1.21 04/28/2013   Lab Results  Component Value Date   WBC 7.2 02/16/2014   HGB 16.0 02/16/2014   HCT 46.5 02/16/2014   MCV 93.4 02/16/2014   PLT 218 02/16/2014   Lab Results  Component Value Date   CREATININE 0.87 02/16/2014   BUN 15 02/16/2014   NA 137 02/16/2014   K 4.0 02/16/2014   CL 99 02/16/2014   CO2 27 02/16/2014   Lab Results  Component Value Date   ALT 20 02/16/2014   AST 21 02/16/2014   ALKPHOS 49 02/16/2014   BILITOT 1.1 02/16/2014   Lab Results  Component Value Date   CHOL 189 02/16/2014   Lab Results  Component Value Date   HDL 68 02/16/2014   Lab Results   Component Value Date   LDLCALC 102* 02/16/2014   Lab Results  Component Value Date   TRIG 97 02/16/2014   Lab Results  Component Value Date   CHOLHDL 2.8 02/16/2014     Assessment & Plan  HYPERTENSION Well controlled, no changes to meds. Encouraged heart healthy diet such as the DASH diet and exercise as tolerated.   OA (osteoarthritis) Left shoulder pain x 1 month, no acute injury, no radicular  symptoms. Pain is anterior and better with activity and worse with prolonged rest. Will try [place] twice daily and heat twice a day, refer to sports med if persists  HYPERLIPIDEMIA Tolerating statin, encouraged heart healthy diet, avoid trans fats, minimize simple carbs and saturated fats. Increase exercise as tolerated. Check lipids today  Hyperglycemia A1C improved at last check will repeat today. hgba1c acceptable, minimize simple carbs. Increase exercise as tolerated.  GERD Avoid offending foods, start probiotics. Do not eat large meals in late evening and consider raising head of bed. Has titrated down his Omeprazole to QOD without flares, will try every 3rd day now and reassess. Consider prn use or Zantac prn

## 2014-06-26 ENCOUNTER — Telehealth: Payer: Self-pay

## 2014-06-26 DIAGNOSIS — R7989 Other specified abnormal findings of blood chemistry: Secondary | ICD-10-CM | POA: Insufficient documentation

## 2014-06-26 NOTE — Telephone Encounter (Signed)
Received call from the lab unable to run the Free T4.  Blood is to old.//AB/CMA

## 2014-06-26 NOTE — Telephone Encounter (Signed)
Message copied by Varney Daily on Mon Jun 26, 2014  9:26 AM ------      Message from: Penni Homans A      Created: Tue Jun 20, 2014  9:57 PM       Please have them add a free T4 for abnormal TSH ------

## 2014-06-26 NOTE — Telephone Encounter (Signed)
Left message for patient to return my call.

## 2014-06-26 NOTE — Telephone Encounter (Signed)
Message copied by Varney Daily on Mon Jun 26, 2014  9:30 AM ------      Message from: Penni Homans A      Created: Tue Jun 20, 2014  9:57 PM       Please have them add a free T4 for abnormal TSH ------

## 2014-06-26 NOTE — Telephone Encounter (Signed)
Please inform pt of MD's message and schedule a lab appt.  Lab ordered

## 2014-06-26 NOTE — Telephone Encounter (Signed)
Lab add on sheet sent to [facility]

## 2014-06-26 NOTE — Telephone Encounter (Signed)
Please let patient know we should run a free T4 some time soon

## 2014-06-26 NOTE — Addendum Note (Signed)
Addended by: Varney Daily on: 06/26/2014 04:00 PM   Modules accepted: Orders

## 2014-06-27 NOTE — Telephone Encounter (Signed)
Lab appointment scheduled 

## 2014-06-28 ENCOUNTER — Other Ambulatory Visit (INDEPENDENT_AMBULATORY_CARE_PROVIDER_SITE_OTHER): Payer: BC Managed Care – PPO

## 2014-06-28 DIAGNOSIS — R7989 Other specified abnormal findings of blood chemistry: Secondary | ICD-10-CM

## 2014-06-29 LAB — T4, FREE: Free T4: 0.85 ng/dL (ref 0.60–1.60)

## 2014-07-28 ENCOUNTER — Other Ambulatory Visit: Payer: Self-pay | Admitting: Family Medicine

## 2014-09-11 ENCOUNTER — Telehealth: Payer: Self-pay | Admitting: Family Medicine

## 2014-09-11 NOTE — Telephone Encounter (Signed)
Please call and inform pt of mds suggestion
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
## 2014-09-11 NOTE — Telephone Encounter (Signed)
I should probably look at this he has risk factors for it not healing or having a secondary infection
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
## 2014-09-11 NOTE — Telephone Encounter (Signed)
Left message for patient to return my call.

## 2014-09-12 NOTE — Telephone Encounter (Signed)
Pt informed and voiced understanding. Pt stated he has been using some otc topical ointments but will stop doing it

## 2014-09-12 NOTE — Telephone Encounter (Signed)
Mostly wash daily with mild soap and water and keep clean and dry, do not cover unless have to go out and keep elevated above heart as much as possible. Do not usually suggest any over the counter topical treatments.

## 2014-09-18 ENCOUNTER — Other Ambulatory Visit: Payer: Self-pay

## 2014-09-18 ENCOUNTER — Ambulatory Visit (INDEPENDENT_AMBULATORY_CARE_PROVIDER_SITE_OTHER): Payer: BC Managed Care – PPO | Admitting: Medical

## 2014-09-18 ENCOUNTER — Telehealth: Payer: Self-pay | Admitting: Family Medicine

## 2014-09-18 ENCOUNTER — Encounter: Payer: Self-pay | Admitting: Medical

## 2014-09-18 VITALS — BP 134/86 | HR 70 | Temp 97.7°F | Ht 70.5 in | Wt 190.6 lb

## 2014-09-18 DIAGNOSIS — IMO0002 Reserved for concepts with insufficient information to code with codable children: Secondary | ICD-10-CM | POA: Insufficient documentation

## 2014-09-18 DIAGNOSIS — T300 Burn of unspecified body region, unspecified degree: Secondary | ICD-10-CM

## 2014-09-18 MED ORDER — DOXYCYCLINE HYCLATE 100 MG PO TABS: 100.0000 mg | ORAL_TABLET | Freq: Two times a day (BID) | ORAL | Status: DC

## 2014-09-18 MED ORDER — SILVER SULFADIAZINE 1 % EX CREA: 1.0000 "application " | TOPICAL_CREAM | Freq: Every day | CUTANEOUS | Status: DC

## 2014-09-18 MED ORDER — TRAMADOL HCL 50 MG PO TABS: 50.0000 mg | ORAL_TABLET | Freq: Three times a day (TID) | ORAL | Status: DC | PRN
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
## 2014-09-18 NOTE — Telephone Encounter (Signed)
Pt requesting doxycycline (VIBRA-TABS) 100 MG tablet sent to [id number] - [place], [place] - [address] AT [place]. Pt is on he's way to the pharmacy and wanted to make you aware

## 2014-09-18 NOTE — Progress Notes (Signed)
Pre visit review using our clinic review tool, if applicable. No additional management support is needed unless otherwise documented below in the visit note. 
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
## 2014-09-20 ENCOUNTER — Encounter: Payer: Self-pay | Admitting: Medical

## 2014-09-20 ENCOUNTER — Ambulatory Visit (INDEPENDENT_AMBULATORY_CARE_PROVIDER_SITE_OTHER): Payer: BC Managed Care – PPO | Admitting: Medical

## 2014-09-20 VITALS — BP 137/88 | HR 75 | Temp 98.3°F | Ht 70.5 in | Wt 191.0 lb

## 2014-09-20 DIAGNOSIS — IMO0002 Reserved for concepts with insufficient information to code with codable children: Secondary | ICD-10-CM

## 2014-09-20 DIAGNOSIS — T300 Burn of unspecified body region, unspecified degree: Secondary | ICD-10-CM

## 2014-09-20 NOTE — Progress Notes (Signed)
Pre visit review using our clinic review tool, if applicable. No additional management support is needed unless otherwise documented below in the visit note. 
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
## 2014-09-21 LAB — WOUND CULTURE
GRAM STAIN: NONE SEEN
GRAM STAIN: NONE SEEN
Gram Stain: NONE SEEN
Organism ID, Bacteria: NO GROWTH

## 2014-09-25 ENCOUNTER — Ambulatory Visit (INDEPENDENT_AMBULATORY_CARE_PROVIDER_SITE_OTHER): Payer: BC Managed Care – PPO | Admitting: Medical

## 2014-09-25 ENCOUNTER — Other Ambulatory Visit: Payer: Self-pay

## 2014-09-25 ENCOUNTER — Emergency Department (HOSPITAL_BASED_OUTPATIENT_CLINIC_OR_DEPARTMENT_OTHER): Payer: BC Managed Care – PPO

## 2014-09-25 ENCOUNTER — Emergency Department (HOSPITAL_BASED_OUTPATIENT_CLINIC_OR_DEPARTMENT_OTHER)
Admission: EM | Admit: 2014-09-25 | Discharge: 2014-09-25 | Disposition: A | Discharge status: Home / Self Care | Payer: BC Managed Care – PPO | Attending: Emergency Medicine | Admitting: Emergency Medicine

## 2014-09-25 ENCOUNTER — Encounter (HOSPITAL_BASED_OUTPATIENT_CLINIC_OR_DEPARTMENT_OTHER): Payer: Self-pay | Admitting: *Deleted

## 2014-09-25 ENCOUNTER — Encounter: Payer: Self-pay | Admitting: Medical

## 2014-09-25 VITALS — BP 125/91 | HR 145 | Temp 97.8°F | Ht 70.5 in | Wt 185.6 lb

## 2014-09-25 DIAGNOSIS — M1990 Unspecified osteoarthritis, unspecified site: Secondary | ICD-10-CM | POA: Insufficient documentation

## 2014-09-25 DIAGNOSIS — Z7982 Long term (current) use of aspirin: Secondary | ICD-10-CM | POA: Insufficient documentation

## 2014-09-25 DIAGNOSIS — IMO0002 Reserved for concepts with insufficient information to code with codable children: Secondary | ICD-10-CM

## 2014-09-25 DIAGNOSIS — Z8781 Personal history of (healed) traumatic fracture: Secondary | ICD-10-CM | POA: Diagnosis not present

## 2014-09-25 DIAGNOSIS — R000 Tachycardia, unspecified: Secondary | ICD-10-CM | POA: Diagnosis present

## 2014-09-25 DIAGNOSIS — I471 Supraventricular tachycardia: Secondary | ICD-10-CM

## 2014-09-25 DIAGNOSIS — Z79899 Other long term (current) drug therapy: Secondary | ICD-10-CM | POA: Diagnosis not present

## 2014-09-25 DIAGNOSIS — E785 Hyperlipidemia, unspecified: Secondary | ICD-10-CM | POA: Insufficient documentation

## 2014-09-25 DIAGNOSIS — I10 Essential (primary) hypertension: Secondary | ICD-10-CM | POA: Diagnosis not present

## 2014-09-25 DIAGNOSIS — Z880 Allergy status to penicillin: Secondary | ICD-10-CM | POA: Insufficient documentation

## 2014-09-25 DIAGNOSIS — R002 Palpitations: Secondary | ICD-10-CM

## 2014-09-25 DIAGNOSIS — T300 Burn of unspecified body region, unspecified degree: Secondary | ICD-10-CM

## 2014-09-25 LAB — CBC WITH DIFFERENTIAL/PLATELET
BASOS ABS: 0 10*3/uL (ref 0.0–0.1)
BASOS PCT: 0 % (ref 0–1)
Eosinophils Absolute: 0.1 10*3/uL (ref 0.0–0.7)
Eosinophils Relative: 1 % (ref 0–5)
HCT: 48.1 % (ref 39.0–52.0)
Hemoglobin: 16.9 g/dL (ref 13.0–17.0)
LYMPHS PCT: 15 % (ref 12–46)
Lymphs Abs: 1.7 10*3/uL (ref 0.7–4.0)
MCH: 32.1 pg (ref 26.0–34.0)
MCHC: 35.1 g/dL (ref 30.0–36.0)
MCV: 91.4 fL (ref 78.0–100.0)
MONO ABS: 1 10*3/uL (ref 0.1–1.0)
Monocytes Relative: 10 % (ref 3–12)
NEUTROS ABS: 8 10*3/uL — AB (ref 1.7–7.7)
Neutrophils Relative %: 74 % (ref 43–77)
PLATELETS: 272 10*3/uL (ref 150–400)
RBC: 5.26 MIL/uL (ref 4.22–5.81)
RDW: 12.8 % (ref 11.5–15.5)
WBC: 10.8 10*3/uL — AB (ref 4.0–10.5)

## 2014-09-25 LAB — BASIC METABOLIC PANEL
ANION GAP: 15 (ref 5–15)
BUN: 15 mg/dL (ref 6–23)
CALCIUM: 9.6 mg/dL (ref 8.4–10.5)
CHLORIDE: 101 meq/L (ref 96–112)
CO2: 25 meq/L (ref 19–32)
Creatinine, Ser: 0.8 mg/dL (ref 0.50–1.35)
GFR calc Af Amer: >90 mL/min (ref >90)
GFR calc non Af Amer: >90 mL/min (ref >90)
Glucose, Bld: 143 mg/dL — ABNORMAL HIGH (ref 70–99)
Potassium: 4.2 mEq/L (ref 3.7–5.3)
Sodium: 141 mEq/L (ref 137–147)

## 2014-09-25 LAB — URINALYSIS, ROUTINE W REFLEX MICROSCOPIC
BILIRUBIN URINE: NEGATIVE
Glucose, UA: NEGATIVE mg/dL
HGB URINE DIPSTICK: NEGATIVE
Ketones, ur: NEGATIVE mg/dL
Leukocytes, UA: NEGATIVE
Nitrite: NEGATIVE
PROTEIN: NEGATIVE mg/dL
Specific Gravity, Urine: 1.013 (ref 1.005–1.030)
UROBILINOGEN UA: 0.2 mg/dL (ref 0.0–1.0)
pH: 6.5 (ref 5.0–8.0)

## 2014-09-25 LAB — HEPATIC FUNCTION PANEL
ALT: 23 U/L (ref 0–53)
AST: 24 U/L (ref 0–37)
Albumin: 4.4 g/dL (ref 3.5–5.2)
Alkaline Phosphatase: 66 U/L (ref 39–117)
Bilirubin, Direct: <0.2 mg/dL (ref 0.0–0.3)
Total Bilirubin: 0.6 mg/dL (ref 0.3–1.2)
Total Protein: 8 g/dL (ref 6.0–8.3)

## 2014-09-25 LAB — RAPID URINE DRUG SCREEN, HOSP PERFORMED
Amphetamines: NOT DETECTED
BARBITURATES: NOT DETECTED
Benzodiazepines: NOT DETECTED
Cocaine: NOT DETECTED
Opiates: NOT DETECTED
TETRAHYDROCANNABINOL: POSITIVE — AB

## 2014-09-25 LAB — ETHANOL

## 2014-09-25 MED ORDER — SULFAMETHOXAZOLE-TRIMETHOPRIM 800-160 MG PO TABS: 1.0000 | ORAL_TABLET | Freq: Two times a day (BID) | ORAL | Status: DC

## 2014-09-25 MED ORDER — SODIUM CHLORIDE 0.9 % IV BOLUS (SEPSIS)
250.0000 mL | Freq: Once | INTRAVENOUS | Status: AC
  Administered 2014-09-25: 250 mL via INTRAVENOUS

## 2014-09-25 MED ORDER — SODIUM CHLORIDE 0.9 % IV SOLN: INTRAVENOUS | Status: DC

## 2014-09-25 MED ORDER — ADENOSINE 6 MG/2ML IV SOLN
6.0000 mg | Freq: Once | INTRAVENOUS | Status: DC
  Filled 2014-09-25: qty 2

## 2014-09-25 NOTE — Assessment & Plan Note (Signed)
Your foot areas and skin  infection appears better. Will give you 4 more days of bactrim ds and follow up in 1 wk. Continue the silvadene.
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
## 2014-09-25 NOTE — ED Notes (Signed)
Preparing pt for adenosine administration with EDP Zackowski at bedside- Pt converted to SR, HR 70s before administration of meds

## 2014-09-25 NOTE — ED Notes (Signed)
MD at bedside. 

## 2014-09-25 NOTE — Patient Instructions (Signed)
Your foot infection appears better. Will give you 4 more days of bactrim ds and follow up in 1 wk.  For you tachycardia new onset with no known etiology and very close to 150, I need to have you evaluated at the ED. I called charge nurse and let them know.  Follow up 7 days on foot or as needed for tachycardia.

## 2014-09-25 NOTE — Progress Notes (Signed)
Pre visit review using our clinic review tool, if applicable. No additional management support is needed unless otherwise documented below in the visit note. 
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
## 2014-09-25 NOTE — Assessment & Plan Note (Signed)
  For you tachycardia new onset with no known etiology and very close to 150, I need to have you evaluated at the ED. I called charge nurse and let them know.  Follow up 7 days on foot or as needed for tachycardia

## 2014-09-25 NOTE — Discharge Instructions (Signed)
Make appointment to follow-up with cardiology. Return for any new or worse symptoms. However it is highly likely that if her heart rate got fast again you would be able to tell checking your blood pressure would also check your heart rate. This may be something that she wanted to at home on a daily basis and keep a log for your primary care doctor. Also make an appointment to follow-up with your regular doctor. Today's workup without any significant findings. But it's clear that she did have a fast heart rate when he first arrived.

## 2014-09-25 NOTE — ED Notes (Signed)
D/c home with instructions for f/u

## 2014-09-26 LAB — TSH: TSH: 0.983 u[IU]/mL (ref 0.350–4.500)

## 2014-10-02 ENCOUNTER — Ambulatory Visit (INDEPENDENT_AMBULATORY_CARE_PROVIDER_SITE_OTHER): Payer: BC Managed Care – PPO | Admitting: Medical

## 2014-10-02 ENCOUNTER — Encounter: Payer: Self-pay | Admitting: Medical

## 2014-10-02 VITALS — BP 122/83 | HR 83 | Temp 97.5°F | Ht 70.5 in | Wt 187.0 lb

## 2014-10-02 DIAGNOSIS — IMO0002 Reserved for concepts with insufficient information to code with codable children: Secondary | ICD-10-CM

## 2014-10-02 DIAGNOSIS — T300 Burn of unspecified body region, unspecified degree: Secondary | ICD-10-CM

## 2014-10-02 DIAGNOSIS — R000 Tachycardia, unspecified: Secondary | ICD-10-CM

## 2014-10-02 MED ORDER — MUPIROCIN CALCIUM 2 % EX CREA: 1.0000 "application " | TOPICAL_CREAM | Freq: Two times a day (BID) | CUTANEOUS | Status: DC

## 2014-10-02 NOTE — Progress Notes (Signed)
Pre visit review using our clinic review tool, if applicable. No additional management support is needed unless otherwise documented below in the visit note. 
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
## 2014-10-02 NOTE — Assessment & Plan Note (Signed)
Follow up with cardiologist for your PSVT episode.  Pt normal rate today on ausculatation.

## 2014-10-09 ENCOUNTER — Telehealth: Payer: Self-pay | Admitting: Family Medicine

## 2014-10-09 NOTE — Telephone Encounter (Signed)
Caller name: Jestin, [place] Relation to pt: self  Call back number: [phone]   Reason for call:  Pt requesting lab orders, pt states MD requesting labs done before follow up. Please advise

## 2014-10-12 NOTE — Telephone Encounter (Signed)
Called and spoke with the pt and informed him that it request that he have his labs done while here for his appt and not before.  Explained to the pt it because of insurance reasons and to make sure all the correct labs are drawn.  Pt understood and agreed.//AB/CMA

## 2014-10-20 ENCOUNTER — Ambulatory Visit: Payer: BC Managed Care – PPO | Admitting: Physician Assistant

## 2014-10-23 ENCOUNTER — Ambulatory Visit: Payer: BC Managed Care – PPO | Admitting: Family Medicine

## 2014-10-26 ENCOUNTER — Other Ambulatory Visit: Payer: Self-pay | Admitting: Family Medicine

## 2014-10-27 NOTE — Telephone Encounter (Signed)
Rx's sent to the pharmacy by e-script.//AB/CMA 

## 2014-11-01 ENCOUNTER — Ambulatory Visit (INDEPENDENT_AMBULATORY_CARE_PROVIDER_SITE_OTHER): Payer: 59 | Admitting: Cardiology

## 2014-11-01 ENCOUNTER — Encounter: Payer: Self-pay | Admitting: Cardiology

## 2014-11-01 VITALS — BP 138/84 | HR 75 | Wt 191.0 lb

## 2014-11-01 DIAGNOSIS — I10 Essential (primary) hypertension: Secondary | ICD-10-CM

## 2014-11-01 DIAGNOSIS — I483 Typical atrial flutter: Secondary | ICD-10-CM

## 2014-11-01 DIAGNOSIS — I4891 Unspecified atrial fibrillation: Secondary | ICD-10-CM | POA: Insufficient documentation

## 2014-11-01 DIAGNOSIS — I4892 Unspecified atrial flutter: Secondary | ICD-10-CM

## 2014-11-01 MED ORDER — APIXABAN 5 MG PO TABS: 5.0000 mg | ORAL_TABLET | Freq: Two times a day (BID) | ORAL | Status: DC

## 2014-11-01 MED ORDER — METOPROLOL SUCCINATE ER 25 MG PO TB24: 25.0000 mg | ORAL_TABLET | Freq: Every day | ORAL | Status: DC
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
## 2014-11-01 NOTE — Assessment & Plan Note (Signed)
Continue statin. 

## 2014-11-01 NOTE — Patient Instructions (Addendum)
Your physician recommends that you schedule a follow-up appointment in: 3 MONTHS WITH DR CRENSHAW  STOP ASPIRIN  START ELIQUIS 5 MG ONE TABLET TWICE DAILY  STOP AMLODIPINE   START METOPROLOL SUCC ER 25 MG ONCE DAILY AT BEDTIME  Your physician has requested that you have an echocardiogram. Echocardiography is a painless test that uses sound waves to create images of your heart. It provides your doctor with information about the size and shape of your heart and how well your heart's chambers and valves are working. This procedure takes approximately one hour. There are no restrictions for this procedure.   REFERRAL TO ELECTROPHSYOLOGIST  TO DISCUSS ATRIAL FLUTTER ABLATION  Your physician recommends that you return for lab work in: [place]

## 2014-11-01 NOTE — Assessment & Plan Note (Signed)
Patient has newly diagnosed atrial flutter. He was asymptomatic at the time and converted spontaneously to sinus rhythm. He is in sinus rhythm on examination today. Discontinue amlodipine. Add Toprol 25 mg daily. Recent TSH normal. Check echocardiogram. Embolic risk factor of hypertension. CHADSvasc 1. However he is 64 years old and also has noted elevated glucose with borderline diabetes mellitus. I therefore feel his risk of embolic event is higher. Discontinue aspirin and begin apixaban 5 mg po BID. Refer to electrophysiology for consideration of ablation. This would allow him to avoid anticoagulation long-term.

## 2014-11-01 NOTE — Assessment & Plan Note (Signed)
Given newly diagnosed atrial flutter I will discontinue Norvasc and instead treat with Toprol 25 mg daily. Follow blood pressure and increase medications as needed.

## 2014-11-15 ENCOUNTER — Ambulatory Visit (HOSPITAL_BASED_OUTPATIENT_CLINIC_OR_DEPARTMENT_OTHER)
Admission: RE | Admit: 2014-11-15 | Discharge: 2014-11-15 | Disposition: A | Discharge status: Home / Self Care | Payer: 59 | Source: Ambulatory Visit | Attending: Cardiology | Admitting: Cardiology

## 2014-11-15 DIAGNOSIS — I4892 Unspecified atrial flutter: Secondary | ICD-10-CM

## 2014-11-15 NOTE — Progress Notes (Signed)
  Echocardiogram 2D Echocardiogram has been performed.  William Phillips 11/15/2014, 9:53 AM

## 2014-11-20 ENCOUNTER — Encounter: Payer: Self-pay | Admitting: Internal Medicine

## 2014-11-20 ENCOUNTER — Encounter: Payer: Self-pay | Admitting: *Deleted

## 2014-11-20 ENCOUNTER — Ambulatory Visit (INDEPENDENT_AMBULATORY_CARE_PROVIDER_SITE_OTHER): Payer: 59 | Admitting: Internal Medicine

## 2014-11-20 VITALS — BP 120/70 | HR 58 | Ht 72.0 in | Wt 191.8 lb

## 2014-11-20 DIAGNOSIS — I483 Typical atrial flutter: Secondary | ICD-10-CM

## 2014-11-20 DIAGNOSIS — I10 Essential (primary) hypertension: Secondary | ICD-10-CM

## 2014-11-20 LAB — CBC WITH DIFFERENTIAL/PLATELET
BASOS PCT: 0.5 % (ref 0.0–3.0)
Basophils Absolute: 0 10*3/uL (ref 0.0–0.1)
Eosinophils Absolute: 0.2 10*3/uL (ref 0.0–0.7)
Eosinophils Relative: 2.4 % (ref 0.0–5.0)
HCT: 43.8 % (ref 39.0–52.0)
Hemoglobin: 14.9 g/dL (ref 13.0–17.0)
Lymphocytes Relative: 20.1 % (ref 12.0–46.0)
Lymphs Abs: 1.8 10*3/uL (ref 0.7–4.0)
MCHC: 33.9 g/dL (ref 30.0–36.0)
MCV: 92.6 fl (ref 78.0–100.0)
MONO ABS: 0.9 10*3/uL (ref 0.1–1.0)
MONOS PCT: 10.5 % (ref 3.0–12.0)
Neutro Abs: 5.8 10*3/uL (ref 1.4–7.7)
Neutrophils Relative %: 66.5 % (ref 43.0–77.0)
Platelets: 259 10*3/uL (ref 150.0–400.0)
RBC: 4.74 Mil/uL (ref 4.22–5.81)
RDW: 13.3 % (ref 11.5–15.5)
WBC: 8.8 10*3/uL (ref 4.0–10.5)

## 2014-11-20 LAB — BASIC METABOLIC PANEL
BUN: 13 mg/dL (ref 6–23)
CO2: 30 meq/L (ref 19–32)
CREATININE: 0.9 mg/dL (ref 0.40–1.50)
Calcium: 9.5 mg/dL (ref 8.4–10.5)
Chloride: 101 mEq/L (ref 96–112)
GFR: 90.7 mL/min (ref >60.00)
Glucose, Bld: 108 mg/dL — ABNORMAL HIGH (ref 70–99)
Potassium: 4.3 mEq/L (ref 3.5–5.1)
SODIUM: 137 meq/L (ref 135–145)

## 2014-11-20 NOTE — Progress Notes (Deleted)
Primary Care Physician: Penni Homans, MD Referring Physician:Dr. Stanford Breed Rhythm.  William Phillips is a 64 y.o. male with a h/o atrial flutter first diagnosed in December of 2015 when being evaluated for an foot injury from which pt was unaware of the rhythm. He was sent to the ER in [facility] but he spontaneously converted. He was referred to Dr. Stanford Breed for further evaluation and found to be in a. Flutter, again asymptomatic. Placed on eliquis for HTN and borderline DM, amlodipine stopped and metoprolol succinate started and referred here for ablation. Echo without significant structural abnormalities.  He is in [facility] today and unaware of any further rhythm issues. Denies a snoring history, is a regular walker but acknowledges being overweight and wishes to lose 10 pounds. Drinks 2-3 glasses of wine a night and discussed with pt to diminish alcohol intake as a possible trigger for a. Flutter as well as for weight loss.BP initially elevated but recheck 120 /74. States controlled at home.  Today, he denies symptoms of palpitations, chest pain, shortness of breath, orthopnea, PND, lower extremity edema, dizziness, presyncope, syncope, or neurologic sequela. The patient is tolerating medications without difficulties and is otherwise without complaint today.   Past Medical History  Diagnosis Date  . Allergic rhinitis   . Hyperlipidemia   . Hypertension   . History of carpal tunnel syndrome   . Ankle fracture 1998  . OA (osteoarthritis) 02/06/2013    knees  . Hammer toe of right foot 05/06/2013  . GERD (gastroesophageal reflux disease)    Past Surgical History  Procedure Laterality Date  . Inguinal hernia repair  2008  . Colonoscopy  08/04/2002    Normal Exam- Dr Henrene Pastor  . Carpel tunnel    . Ankle surgery    . Tonsillectomy      Current Outpatient Prescriptions  Medication Sig Dispense Refill  . apixaban (ELIQUIS) 5 MG TABS tablet Take 1 tablet (5 mg total) by mouth 2 (two) times  daily. 60 tablet 6  . metoprolol succinate (TOPROL XL) 25 MG 24 hr tablet Take 1 tablet (25 mg total) by mouth daily. 90 tablet 3  . Multiple Vitamin (MULTIVITAMIN) tablet Take 1 tablet by mouth daily.      Marland Kitchen omeprazole (PRILOSEC) 20 MG capsule TAKE 1 CAPSULE BY MOUTH EVERY DAY 15 MINUTES BEFORE MORNING MEAL AS NEEDED (Patient taking differently: TAKE 1 CAPSULE BY MOUTH EVERY DAY 15 MINUTES BEFORE MORNING MEAL AS NEEDED FOR HEARTBURN OR ACID REFLUX) 90 capsule 1  . quinapril-hydrochlorothiazide (ACCURETIC) 20-12.5 MG per tablet TAKE ONE TABLET BY MOUTH ONCE DAILY 90 tablet 1  . sildenafil (VIAGRA) 100 MG tablet Take 1 tablet (100 mg total) by mouth as needed for erectile dysfunction. 1 tablet by mouth once a day as needed 10 tablet 1  . simvastatin (ZOCOR) 20 MG tablet TAKE ONE TABLET BY MOUTH AT BEDTIME 90 tablet 1   No current facility-administered medications for this visit.    Allergies  Allergen Reactions  . Penicillins     REACTION: swelling , irriation    History   Social History  . Marital Status: Married    Spouse Name: N/A    Number of Children: 1  . Years of Education: N/A   Occupational History  .      Sales   Social History Main Topics  . Smoking status: Former Research scientist (life sciences)  . Smokeless tobacco: Never Used     Comment: 5-10 pack year history  . Alcohol Use: Yes  Comment: 2-3 glasses of wine  . Drug Use: Not on file  . Sexual Activity: Yes   Other Topics Concern  . 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 Hyperlipidemia    . Hypertension    . Coronary artery disease Father     aortic valve disease  . Heart attack Father 106  . COPD Father   . Hypertension Father   . Hyperlipidemia Father    . Dementia Father   . Diabetes type II Maternal Grandfather   . Arthritis Mother   . Diabetes Sister   . Diabetes Brother     ROS- All systems are reviewed and negative except as per the HPI above  Physical Exam: Filed Vitals:   11/20/14 0836  BP: 148/84  Pulse: 58  Height: 6' (1.829 m)  Weight: 191 lb 12.8 oz (87 kg)    GEN- The patient is well appearing, alert and oriented x 3 today.   Head- normocephalic, atraumatic Eyes-  Sclera clear, conjunctiva pink Ears- hearing intact Oropharynx- clear Neck- supple, no JVP Lymph- no cervical lymphadenopathy Lungs- Clear to ausculation bilaterally, normal work of breathing Heart- Regular rate and rhythm, no murmurs, rubs or gallops, PMI not laterally displaced GI- soft, NT, ND, + BS Extremities- no clubbing, cyanosis, or edema MS- no significant deformity or atrophy Skin- no rash or lesion Psych- euthymic mood, full affect Neuro- strength and sensation are intact  EKG- Sinus brady at 58 bpm, otherwise normal ekg. Echo  Left ventricle: The cavity size was normal. Wall thickness was normal. Systolic function was normal. The estimated ejection fraction was in the range of 55% to 60%. Wall motion was normal; there were no regional wall motion abnormalities. Doppler parameters are consistent with abnormal left ventricular relaxation (grade 1 diastolic dysfunction). The E/e&' ratio is between 8-15, suggesting indeterminate LV filling pressure. - Left atrium: The atrium was normal in size. - Right atrium: The atrium was normal in size.     Assessment and Plan: 1. Typical Atrial flutter Therapeutic strategies for aflutter including medicine and ablation were discussed in detail with the patient today. Risk, benefits, and alternatives to EP study and radiofrequency ablation for aflutter were also discussed in detail today. These risks include but are not limited to stroke, bleeding, vascular damage, tamponade, need for  PPM, , worsening renal function, and death. The patient understands these risk and wishes to proceed.    2. Chadsvasc score of 1-2 Continue eliquis 5 mg bid without missed doses. Likely will not need long term anticoagulation.  3. HTN Well controlled today with repeat BP of 120/70.  4. Obesity Regular exercise and wt loss recommended Encouraged reduction of alcohol use to 1-2 drinks a week.

## 2014-11-20 NOTE — Patient Instructions (Signed)
Your physician has recommended that you have an ablation. Catheter ablation is a medical procedure used to treat some cardiac arrhythmias (irregular heartbeats). During catheter ablation, a long, thin, flexible tube is put into a blood vessel in your groin (upper thigh), or neck. This tube is called an ablation catheter. It is then guided to your heart through the blood vessel. Radio frequency waves destroy small areas of heart tissue where abnormal heartbeats may cause an arrhythmia to start. Please see the instruction sheet given to you today.  See instruction sheet for procedure  Cardiac Ablation Cardiac ablation is a procedure to disable a small amount of heart tissue in very specific places. The heart has many electrical connections. Sometimes these connections are abnormal and can cause the heart to beat very fast or irregularly. By disabling some of the problem areas, heart rhythm can be improved or made normal. Ablation is done for people who:   Have Wolff-Parkinson-White syndrome.   Have other fast heart rhythms (tachycardia).   Have taken medicines for an abnormal heart rhythm (arrhythmia) that resulted in:   No success.   Side effects.   May have a high-risk heartbeat that could result in death.  LET YOUR HEALTH CARE PROVIDER KNOW ABOUT:   Any allergies you have or any previous reactions you have had to X-ray dye, food (such as seafood), medicine, or tape.   All medicines you are taking, including vitamins, herbs, eye drops, creams, and over-the-counter medicines.   Previous problems you or members of your family have had with the use of anesthetics.   Any blood disorders you have.   Previous surgeries or procedures (such as a kidney transplant) you have had.   Medical conditions you have (such as kidney failure).  RISKS AND COMPLICATIONS Generally, cardiac ablation is a safe procedure. However, problems can occur and include:   Increased risk of cancer.  Depending on how long it takes to do the ablation, the dose of radiation can be high.  Bruising and bleeding where a thin, flexible tube (catheter) was inserted during the procedure.   Bleeding into the chest, especially into the sac that surrounds the heart (serious).  Need for a permanent pacemaker if the normal electrical system is damaged.   The procedure may not be fully effective, and this may not be recognized for months. Repeat ablation procedures are sometimes required. BEFORE THE PROCEDURE   Follow any instructions from your health care provider regarding eating and drinking before the procedure.   Take your medicines as directed at regular times with water, unless instructed otherwise by your health care provider. If you are taking diabetes medicine, including insulin, ask how you are to take it and if there are any special instructions you should follow. It is common to adjust insulin dosing the day of the ablation.  PROCEDURE  An ablation is usually performed in a catheterization laboratory with the guidance of fluoroscopy. Fluoroscopy is a type of X-ray that helps your health care provider see images of your heart during the procedure.   An ablation is a minimally invasive procedure. This means a small cut (incision) is made in either your neck or groin. Your health care provider will decide where to make the incision based on your medical history and physical exam.  An IV tube will be started before the procedure begins. You will be given an anesthetic or medicine to help you relax (sedative).  The skin on your neck or groin will be numbed. A needle   will be inserted into a large vein in your neck or groin and catheters will be threaded to your heart.  A special dye that shows up on fluoroscopy pictures may be injected through the catheter. The dye helps your health care provider see the area of the heart that needs treatment.  The catheter has electrodes on the tip.  When the area of heart tissue that is causing the arrhythmia is found, the catheter tip will send an electrical current to the area and "scar" the tissue. Three types of energy can be used to ablate the heart tissue:   Heat (radiofrequency energy).   Laser energy.   Extreme cold (cryoablation).   When the area of the heart has been ablated, the catheter will be taken out. Pressure will be held on the insertion site. This will help the insertion site clot and keep it from bleeding. A bandage will be placed on the insertion site.  AFTER THE PROCEDURE   After the procedure, you will be taken to a recovery area where your vital signs (blood pressure, heart rate, and breathing) will be monitored. The insertion site will also be monitored for bleeding.   You will need to lie still for 4-6 hours. This is to ensure you do not bleed from the catheter insertion site.  Document Released: 02/15/2009 Document Revised: 02/13/2014 Document Reviewed: 02/21/2013 Exit[employer] Patient Information 2015 [employer]. This information is not intended to replace advice given to you by your health care provider. Make sure you discuss any questions you have with your health care provider.  

## 2014-11-22 NOTE — Progress Notes (Signed)
Primary Care Physician: Penni Homans, MD Referring Physician:Dr. Stanford Breed Rhythm.  William Phillips is a 64 y.o. male with a h/o atrial flutter first diagnosed in December of 2015 when being evaluated for an foot injury from which pt was unaware of the rhythm. He was sent to the ER in [facility] but he spontaneously converted. He was referred to Dr. Stanford Breed for further evaluation and found to be in a. Flutter, again asymptomatic. Placed on eliquis for HTN and borderline DM, amlodipine stopped and metoprolol succinate started and referred here for ablation. Echo without significant structural abnormalities.  He is in [facility] today and unaware of any further rhythm issues. Denies a snoring history, is a regular walker but acknowledges being overweight and wishes to lose 10 pounds. Drinks 2-3 glasses of wine a night and discussed with pt to diminish alcohol intake as a possible trigger for a. Flutter as well as for weight loss.BP initially elevated but recheck 120 /74. States controlled at home.  Today, he denies symptoms of palpitations, chest pain, shortness of breath, orthopnea, PND, lower extremity edema, dizziness, presyncope, syncope, or neurologic sequela. The patient is tolerating medications without difficulties and is otherwise without complaint today.   Past Medical History  Diagnosis Date  . Allergic rhinitis   . Hyperlipidemia   . Hypertension   . History of carpal tunnel syndrome   . Ankle fracture 1998  . OA (osteoarthritis) 02/06/2013    knees  . Hammer toe of right foot 05/06/2013  . GERD (gastroesophageal reflux disease)    Past Surgical History  Procedure Laterality Date  . Inguinal hernia repair  2008  . Colonoscopy  08/04/2002    Normal Exam- Dr Henrene Pastor  . Carpel tunnel    . Ankle surgery    . Tonsillectomy      Current Outpatient Prescriptions  Medication Sig Dispense Refill  . apixaban (ELIQUIS) 5 MG TABS tablet Take 1 tablet (5 mg total) by mouth 2 (two) times  daily. 60 tablet 6  . metoprolol succinate (TOPROL XL) 25 MG 24 hr tablet Take 1 tablet (25 mg total) by mouth daily. 90 tablet 3  . Multiple Vitamin (MULTIVITAMIN) tablet Take 1 tablet by mouth daily.      Marland Kitchen omeprazole (PRILOSEC) 20 MG capsule TAKE 1 CAPSULE BY MOUTH EVERY DAY 15 MINUTES BEFORE MORNING MEAL AS NEEDED (Patient taking differently: TAKE 1 CAPSULE BY MOUTH EVERY DAY 15 MINUTES BEFORE MORNING MEAL AS NEEDED FOR HEARTBURN OR ACID REFLUX) 90 capsule 1  . quinapril-hydrochlorothiazide (ACCURETIC) 20-12.5 MG per tablet TAKE ONE TABLET BY MOUTH ONCE DAILY 90 tablet 1  . sildenafil (VIAGRA) 100 MG tablet Take 1 tablet (100 mg total) by mouth as needed for erectile dysfunction. 1 tablet by mouth once a day as needed 10 tablet 1  . simvastatin (ZOCOR) 20 MG tablet TAKE ONE TABLET BY MOUTH AT BEDTIME 90 tablet 1   No current facility-administered medications for this visit.    Allergies  Allergen Reactions  . Penicillins     REACTION: swelling , irriation    History   Social History  . Marital Status: Married    Spouse Name: N/A    Number of Children: 1  . Years of Education: N/A   Occupational History  .      Sales   Social History Main Topics  . Smoking status: Former Research scientist (life sciences)  . Smokeless tobacco: Never Used     Comment: 5-10 pack year history  . Alcohol Use: Yes  Comment: 2-3 glasses of wine  . Drug Use: Not on file  . Sexual Activity: Yes   Other Topics Concern  . 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 Hyperlipidemia    . Hypertension    . Coronary artery disease Father     aortic valve disease  . Heart attack Father 64  . COPD Father   . Hypertension Father   . Hyperlipidemia Father    . Dementia Father   . Diabetes type II Maternal Grandfather   . Arthritis Mother   . Diabetes Sister   . Diabetes Brother     ROS- All systems are reviewed and negative except as per the HPI above  Physical Exam: Filed Vitals:   11/20/14 0836  BP: 148/84  Pulse: 58  Height: 6' (1.829 m)  Weight: 191 lb 12.8 oz (87 kg)    GEN- The patient is well appearing, alert and oriented x 3 today.   Head- normocephalic, atraumatic Eyes-  Sclera clear, conjunctiva pink Ears- hearing intact Oropharynx- clear Neck- supple, no JVP Lymph- no cervical lymphadenopathy Lungs- Clear to ausculation bilaterally, normal work of breathing Heart- Regular rate and rhythm, no murmurs, rubs or gallops, PMI not laterally displaced GI- soft, NT, ND, + BS Extremities- no clubbing, cyanosis, or edema MS- no significant deformity or atrophy Skin- no rash or lesion Psych- euthymic mood, full affect Neuro- strength and sensation are intact  EKG- Sinus brady at 58 bpm, otherwise normal ekg. Echo  Left ventricle: The cavity size was normal. Wall thickness was normal. Systolic function was normal. The estimated ejection fraction was in the range of 55% to 60%. Wall motion was normal; there were no regional wall motion abnormalities. Doppler parameters are consistent with abnormal left ventricular relaxation (grade 1 diastolic dysfunction). The E/e&' ratio is between 8-15, suggesting indeterminate LV filling pressure. - Left atrium: The atrium was normal in size. - Right atrium: The atrium was normal in size.     Assessment and Plan: 1. Typical Atrial flutter Therapeutic strategies for aflutter including medicine and ablation were discussed in detail with the patient today. Risk, benefits, and alternatives to EP study and radiofrequency ablation for aflutter were also discussed in detail today. These risks include but are not limited to stroke, bleeding, vascular damage, tamponade, need for  PPM, , worsening renal function, and death. The patient understands these risk and wishes to proceed.    2. Chadsvasc score of 1-2 Continue eliquis 5 mg bid without missed doses. Likely will not need long term anticoagulation.  3. HTN Well controlled today with repeat BP of 120/70.  4. Obesity Regular exercise and wt loss recommended Encouraged reduction of alcohol use to 1-2 drinks a week.  Marland Kitchen

## 2014-11-24 ENCOUNTER — Ambulatory Visit (HOSPITAL_COMMUNITY): Payer: 59 | Admitting: Anesthesiology

## 2014-11-24 ENCOUNTER — Ambulatory Visit (HOSPITAL_COMMUNITY)
Admission: RE | Admit: 2014-11-24 | Discharge: 2014-11-24 | Disposition: A | Discharge status: Home / Self Care | Payer: 59 | Source: Ambulatory Visit | Attending: Internal Medicine | Admitting: Internal Medicine

## 2014-11-24 ENCOUNTER — Encounter (HOSPITAL_COMMUNITY)
Admission: RE | Disposition: A | Discharge status: Home / Self Care | Payer: Self-pay | Source: Ambulatory Visit | Attending: Internal Medicine

## 2014-11-24 ENCOUNTER — Encounter (HOSPITAL_COMMUNITY): Payer: Self-pay | Admitting: *Deleted

## 2014-11-24 DIAGNOSIS — M179 Osteoarthritis of knee, unspecified: Secondary | ICD-10-CM | POA: Insufficient documentation

## 2014-11-24 DIAGNOSIS — I10 Essential (primary) hypertension: Secondary | ICD-10-CM | POA: Diagnosis not present

## 2014-11-24 DIAGNOSIS — I4892 Unspecified atrial flutter: Secondary | ICD-10-CM | POA: Diagnosis present

## 2014-11-24 DIAGNOSIS — I4891 Unspecified atrial fibrillation: Secondary | ICD-10-CM | POA: Insufficient documentation

## 2014-11-24 DIAGNOSIS — E669 Obesity, unspecified: Secondary | ICD-10-CM | POA: Diagnosis not present

## 2014-11-24 DIAGNOSIS — I471 Supraventricular tachycardia: Secondary | ICD-10-CM | POA: Diagnosis present

## 2014-11-24 DIAGNOSIS — Z87891 Personal history of nicotine dependence: Secondary | ICD-10-CM | POA: Diagnosis not present

## 2014-11-24 DIAGNOSIS — K219 Gastro-esophageal reflux disease without esophagitis: Secondary | ICD-10-CM | POA: Insufficient documentation

## 2014-11-24 DIAGNOSIS — E785 Hyperlipidemia, unspecified: Secondary | ICD-10-CM | POA: Diagnosis not present

## 2014-11-24 DIAGNOSIS — I483 Typical atrial flutter: Secondary | ICD-10-CM

## 2014-11-24 HISTORY — DX: Unspecified atrial flutter: I48.92

## 2014-11-24 HISTORY — PX: ATRIAL FLUTTER ABLATION: SHX5733

## 2014-11-24 HISTORY — PX: ABLATION: SHX5711

## 2014-11-24 SURGERY — ATRIAL FLUTTER ABLATION
Anesthesia: General

## 2014-11-24 MED ORDER — LACTATED RINGERS IV SOLN
INTRAVENOUS | Status: DC | PRN
  Administered 2014-11-24 (×2): via INTRAVENOUS

## 2014-11-24 MED ORDER — ONDANSETRON HCL 4 MG/2ML IJ SOLN
INTRAMUSCULAR | Status: DC | PRN
  Administered 2014-11-24: 4 mg via INTRAVENOUS

## 2014-11-24 MED ORDER — HYDROCODONE-ACETAMINOPHEN 5-325 MG PO TABS
1.0000 | ORAL_TABLET | ORAL | Status: DC | PRN
  Administered 2014-11-24: 2 via ORAL
  Filled 2014-11-24: qty 2

## 2014-11-24 MED ORDER — EPHEDRINE SULFATE 50 MG/ML IJ SOLN
INTRAMUSCULAR | Status: DC | PRN
  Administered 2014-11-24 (×2): 5 mg via INTRAVENOUS

## 2014-11-24 MED ORDER — DOBUTAMINE IN D5W 4-5 MG/ML-% IV SOLN
INTRAVENOUS | Status: AC
  Filled 2014-11-24: qty 250

## 2014-11-24 MED ORDER — LIDOCAINE HCL (CARDIAC) 20 MG/ML IV SOLN
INTRAVENOUS | Status: DC | PRN
  Administered 2014-11-24: 40 mg via INTRAVENOUS

## 2014-11-24 MED ORDER — FENTANYL CITRATE 0.05 MG/ML IJ SOLN: 25.0000 ug | INTRAMUSCULAR | Status: DC | PRN

## 2014-11-24 MED ORDER — SODIUM CHLORIDE 0.9 % IJ SOLN: 3.0000 mL | Freq: Two times a day (BID) | INTRAMUSCULAR | Status: DC

## 2014-11-24 MED ORDER — SODIUM CHLORIDE 0.9 % IJ SOLN: 3.0000 mL | INTRAMUSCULAR | Status: DC | PRN

## 2014-11-24 MED ORDER — DOBUTAMINE IN D5W 4-5 MG/ML-% IV SOLN
INTRAVENOUS | Status: DC | PRN
  Administered 2014-11-24: 20 ug/kg/min via INTRAVENOUS

## 2014-11-24 MED ORDER — BUPIVACAINE HCL (PF) 0.25 % IJ SOLN
INTRAMUSCULAR | Status: AC
  Filled 2014-11-24: qty 30

## 2014-11-24 MED ORDER — MIDAZOLAM HCL 5 MG/5ML IJ SOLN
INTRAMUSCULAR | Status: DC | PRN
  Administered 2014-11-24: 2 mg via INTRAVENOUS

## 2014-11-24 MED ORDER — PROPOFOL 10 MG/ML IV BOLUS
INTRAVENOUS | Status: DC | PRN
  Administered 2014-11-24: 150 mg via INTRAVENOUS
  Administered 2014-11-24: 50 mg via INTRAVENOUS
  Administered 2014-11-24: 200 mg via INTRAVENOUS

## 2014-11-24 MED ORDER — FENTANYL CITRATE 0.05 MG/ML IJ SOLN
INTRAMUSCULAR | Status: DC | PRN
  Administered 2014-11-24 (×4): 25 ug via INTRAVENOUS

## 2014-11-24 MED ORDER — ACETAMINOPHEN 325 MG PO TABS: 650.0000 mg | ORAL_TABLET | ORAL | Status: DC | PRN

## 2014-11-24 MED ORDER — ONDANSETRON HCL 4 MG/2ML IJ SOLN: 4.0000 mg | Freq: Four times a day (QID) | INTRAMUSCULAR | Status: DC | PRN

## 2014-11-24 MED ORDER — SODIUM CHLORIDE 0.9 % IV SOLN: 250.0000 mL | INTRAVENOUS | Status: DC | PRN

## 2014-11-24 NOTE — Anesthesia Postprocedure Evaluation (Signed)
  Anesthesia Post-op Note  Patient: William Phillips  Procedure(s) Performed: Procedure(s): ATRIAL FLUTTER ABLATION (N/A)  Patient Location: Cath Lab  Anesthesia Type:General  Level of Consciousness: awake, alert  and oriented  Airway and Oxygen Therapy: Patient Spontanous Breathing  Post-op Pain: none  Post-op Assessment: Post-op Vital signs reviewed, Patient's Cardiovascular Status Stable, Respiratory Function Stable, Patent Airway and No signs of Nausea or vomiting  Post-op Vital Signs: Reviewed and stable  Last Vitals:  Filed Vitals:   11/24/14 0600  BP: 123/77  Pulse: 59  Temp: 36.7 C  Resp: 16    Complications: No apparent anesthesia complications

## 2014-11-24 NOTE — Discharge Instructions (Signed)
No driving for 5 days. No lifting over 5 lbs for 1 week. No sexual activity for 1 week. You may return to work in 1 week. Keep procedure site clean & dry. If you notice increased pain, swelling, bleeding or pus, call/return!  You may shower, but no soaking baths/hot tubs/pools for 1 week.  ° ° °

## 2014-11-24 NOTE — Anesthesia Procedure Notes (Signed)
Procedure Name: LMA Insertion Date/Time: 11/24/2014 7:48 AM Performed by: Maryland Pink Pre-anesthesia Checklist: Patient identified, Emergency Drugs available, Suction available, Patient being monitored and Timeout performed Patient Re-evaluated:Patient Re-evaluated prior to inductionOxygen Delivery Method: Circle system utilized Intubation Type: IV induction Ventilation: Oral airway inserted - appropriate to patient size and Two handed mask ventilation required LMA: LMA inserted LMA Size: 4.0 Number of attempts: 3 Placement Confirmation: positive ETCO2 and breath sounds checked- equal and bilateral Tube secured with: Tape Dental Injury: Teeth and Oropharynx as per pre-operative assessment  Comments: Attempt 1- LMA #5 inserted with ease, no EtCO2 or air movement.  LMA removed oral airway inserted mask ventilation started. LMA #5 second attempt with same result. LMA #4 inserted without difficulty positive EtCO2, chest rise. VSS

## 2014-11-24 NOTE — Anesthesia Preprocedure Evaluation (Signed)
Anesthesia Evaluation  Patient identified by MRN, date of birth, ID band Patient awake    Reviewed: Allergy & Precautions, H&P , NPO status , Patient's Chart, lab work & pertinent test results, reviewed documented beta blocker date and time   Airway Mallampati: II  TM Distance: >3 FB Neck ROM: Full    Dental no notable dental hx. (+) Teeth Intact, Dental Advisory Given   Pulmonary neg pulmonary ROS, former smoker,  breath sounds clear to auscultation  Pulmonary exam normal       Cardiovascular hypertension, Pt. on medications and Pt. on home beta blockers + dysrhythmias Atrial Fibrillation Rhythm:Irregular Rate:Normal     Neuro/Psych negative neurological ROS  negative psych ROS   GI/Hepatic Neg liver ROS, GERD-  Medicated and Controlled,  Endo/Other  negative endocrine ROS  Renal/GU negative Renal ROS  negative genitourinary   Musculoskeletal  (+) Arthritis -, Osteoarthritis,    Abdominal   Peds  Hematology negative hematology ROS (+)   Anesthesia Other Findings   Reproductive/Obstetrics negative OB ROS                             Anesthesia Physical Anesthesia Plan  ASA: III  Anesthesia Plan: General   Post-op Pain Management:    Induction: Intravenous  Airway Management Planned: LMA  Additional Equipment:   Intra-op Plan:   Post-operative Plan: Extubation in OR  Informed Consent: I have reviewed the patients History and Physical, chart, labs and discussed the procedure including the risks, benefits and alternatives for the proposed anesthesia with the patient or authorized representative who has indicated his/her understanding and acceptance.   Dental advisory given  Plan Discussed with: CRNA  Anesthesia Plan Comments:         Anesthesia Quick Evaluation

## 2014-11-24 NOTE — Progress Notes (Signed)
Sheaths removed and pt waiting for bed assignment.  Family at bedside

## 2014-11-24 NOTE — Op Note (Signed)
PREPROCEDURE DIAGNOSIS: Typical Atrial flutter.   POSTPROCEDURE DIAGNOSIS: Typical Atrial flutter.   PROCEDURES:  1. Comprehensive electrophysiology study.  2. Coronary sinus pacing and recording.  3. Mapping of atrial flutter.  5. Ablation of atrial flutter.  6. Arrhythmia induction with pacing and dobutamine infusion  INTRODUCTION: William Phillips is a 64 y.o. male with symptomatic typical atrial flutter. He presents today for EP study and radiofrequency ablation.   DESCRIPTION OF PROCEDURE: Informed written consent was obtained, and the patient was brought to the Electrophysiology Lab in the fasting state. He was adequately sedated as outlined in  the anesthesia report. The patient's right groin was prepped and draped in the usual sterile fashion by the EP lab staff. Using a percutaneous Seldinger technique, one two 7 and one 8-French hemostasis sheaths were placed in the right common femoral vein. A 7-French Biosence Webster decapolar catheter was introduced through the right common femoral vein and advanced into the coronary sinus for recording and pacing from this location. A 6-French quadripolar Josephson catheter was introduced through the right common femoral vein and advanced into the right ventricle for recording and pacing, but this catheter was then pulled back to the His bundle location.   Initial Measurements: The patient presented to the electrophysiology lab in sinus rhythm. His PR interval measured 160 msec with a QRS duration of 89 msec and a QT interval 423 msec. His RR interval measured 848 msec. His AH interval measured 83 msec with an HV interval of 45 msec.   Pacing Maneuvers: Ventricular pacing was performed which revealed midline/ concentric VA conduction with a VA wenckebach cycle length of 360 msec. Rapid atrial pacing was performed which revealed an AV Wenckebach cycle length of 330 msec. There was no evidence of PR greater than RR. Rapid atrial pacing was  continued down  to a cycle length of 220 msec.  Nonsustained atrial flutter was observed. The CS activation was proximal to distal and suggestive of right atrial flutter.  This was nonsustained and therefore additional mapping could not be performed.  Additional RAP was performed with no arrhythmias observed. Multiple attempts to induce sustained atrial flutter were made, however, this was unsuccessful.  Ablation:  As the patient has typical atrial flutter documented, I felt that the most prudent approach at this point was to proceed with cavotricuspid isthmus ablation. A 7-French [employer] II XP 10-mm ablation catheter was introduced through the right common femoral vein and advanced into the right atrium. Atrial mapping along  the cavotricuspid isthmus was performed. This demonstrated a standard  CavoTricuspid isthmus. A series of 18 radiofrequency applications were delivered  along the cavotricuspid isthmus with a target temperature of 60 degrees at 70-100 watts for 120 seconds each.  A dual decapolar circular mapping catheter was introduced through the right common femoral vein and advanced into the right atrium. This Halo catheter was then positioned around the cavotricuspid isthmus. Differential atrial pacing was again performed which revealed complete bidirectional isthmus block. The patient was  observed for 20 minutes without return of conduction through the isthmus.   Measurements Post ablation: Following ablation, dobutamine was infused at 20 mcg/kg/min.   RAP was performed down to a cycle length of 220 msec with no arrhythmias observed.  Following ablation, the AH interval measured 75 msec with an HV interval of 51 msec. The AV Wenckebach cycle length was 330 msec. Rapid atrial pacing was again performed down to a cycle length of 200 msec without any arrhythmias observed today.  VEST was performed which revealed midline concentric VA conduction with no retrograde jumps, echo  beats, or tachycardias observed.  The V ERP was 500/270 msec. AEST was performed with no retrograde jumps, echo beats, or tachycardias observed.  The AV nodal ERP was 500/290 msec  The procedure was therefore considered completed. All catheters were removed and the sheaths were aspirated and flushed. The sheaths were removed and hemostasis was assured. EBL<38ml. There were no early apparent complications.    CONCLUSIONS:  1. Sinus rhythm upon presentation.  2. Sustained Atrial flutter, not induced today  3. Empiric cavotricuspid isthmus ablation was performed with complete bidirectional isthmus block achieved.  4. No inducible arrhythmias following ablation both on and off of dobutamine.  5. No early apparent complications.   Thompson Grayer MD 11/24/2014 9:34 AM

## 2014-11-24 NOTE — Progress Notes (Signed)
Site area: right groin a 7 french X2, and a 8 french venous sheaths removed   Site Prior to Removal:  Level 0  Pressure Applied For 15 MINUTES    Minutes Beginning at 0950am  Manual:   Yes.    Patient Status During Pull:  stable  Post Pull Groin Site:  Level 0  Post Pull Instructions Given:  Yes.    Post Pull Pulses Present:  Yes.    Dressing Applied:  Yes.    Comments:  VS remain stable during sheath pull.  Pt denies any discomfort at groin site at this time.

## 2014-11-24 NOTE — Interval H&P Note (Signed)
History and Physical Interval Note:  11/24/2014 7:23 AM  William Phillips  has presented today for surgery, with the diagnosis of aflutter  The various methods of treatment have been discussed with the patient and family. After consideration of risks, benefits and other options for treatment, the patient has consented to  Procedure(s): ATRIAL FLUTTER ABLATION (N/A) as a surgical intervention .  The patient's history has been reviewed, patient examined, no change in status, stable for surgery.  I have reviewed the patient's chart and labs.  Questions were answered to the patient's satisfaction.     Thompson Grayer

## 2014-11-24 NOTE — H&P (View-Only) (Signed)
Primary Care Physician: Penni Homans, MD Referring Physician:Dr. Stanford Breed Rhythm.  William Phillips is a 64 y.o. male with a h/o atrial flutter first diagnosed in December of 2015 when being evaluated for an foot injury from which pt was unaware of the rhythm. He was sent to the ER in [facility] but he spontaneously converted. He was referred to Dr. Stanford Breed for further evaluation and found to be in a. Flutter, again asymptomatic. Placed on eliquis for HTN and borderline DM, amlodipine stopped and metoprolol succinate started and referred here for ablation. Echo without significant structural abnormalities.  He is in S. Loletha Grayer today and unaware of any further rhythm issues. Denies a snoring history, is a regular walker but acknowledges being overweight and wishes to lose 10 pounds. Drinks 2-3 glasses of wine a night and discussed with pt to diminish alcohol intake as a possible trigger for a. Flutter as well as for weight loss.BP initially elevated but recheck 120 /74. States controlled at home.  Today, he denies symptoms of palpitations, chest pain, shortness of breath, orthopnea, PND, lower extremity edema, dizziness, presyncope, syncope, or neurologic sequela. The patient is tolerating medications without difficulties and is otherwise without complaint today.   Past Medical History  Diagnosis Date  . Allergic rhinitis   . Hyperlipidemia   . Hypertension   . History of carpal tunnel syndrome   . Ankle fracture 1998  . OA (osteoarthritis) 02/06/2013    knees  . Hammer toe of right foot 05/06/2013  . GERD (gastroesophageal reflux disease)    Past Surgical History  Procedure Laterality Date  . Inguinal hernia repair  2008  . Colonoscopy  08/04/2002    Normal Exam- Dr Henrene Pastor  . Carpel tunnel    . Ankle surgery    . Tonsillectomy      Current Outpatient Prescriptions  Medication Sig Dispense Refill  . apixaban (ELIQUIS) 5 MG TABS tablet Take 1 tablet (5 mg total) by mouth 2 (two) times  daily. 60 tablet 6  . metoprolol succinate (TOPROL XL) 25 MG 24 hr tablet Take 1 tablet (25 mg total) by mouth daily. 90 tablet 3  . Multiple Vitamin (MULTIVITAMIN) tablet Take 1 tablet by mouth daily.      Marland Kitchen omeprazole (PRILOSEC) 20 MG capsule TAKE 1 CAPSULE BY MOUTH EVERY DAY 15 MINUTES BEFORE MORNING MEAL AS NEEDED (Patient taking differently: TAKE 1 CAPSULE BY MOUTH EVERY DAY 15 MINUTES BEFORE MORNING MEAL AS NEEDED FOR HEARTBURN OR ACID REFLUX) 90 capsule 1  . quinapril-hydrochlorothiazide (ACCURETIC) 20-12.5 MG per tablet TAKE ONE TABLET BY MOUTH ONCE DAILY 90 tablet 1  . sildenafil (VIAGRA) 100 MG tablet Take 1 tablet (100 mg total) by mouth as needed for erectile dysfunction. 1 tablet by mouth once a day as needed 10 tablet 1  . simvastatin (ZOCOR) 20 MG tablet TAKE ONE TABLET BY MOUTH AT BEDTIME 90 tablet 1   No current facility-administered medications for this visit.    Allergies  Allergen Reactions  . Penicillins     REACTION: swelling , irriation    History   Social History  . Marital Status: Married    Spouse Name: N/A    Number of Children: 1  . Years of Education: N/A   Occupational History  .      Sales   Social History Main Topics  . Smoking status: Former Research scientist (life sciences)  . Smokeless tobacco: Never Used     Comment: 5-10 pack year history  . Alcohol Use: Yes  Comment: 2-3 glasses of wine  . Drug Use: Not on file  . Sexual Activity: Yes   Other Topics Concern  . 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 Hyperlipidemia    . Hypertension    . Coronary artery disease Father     aortic valve disease  . Heart attack Father 20  . COPD Father   . Hypertension Father   . Hyperlipidemia Father    . Dementia Father   . Diabetes type II Maternal Grandfather   . Arthritis Mother   . Diabetes Sister   . Diabetes Brother     ROS- All systems are reviewed and negative except as per the HPI above  Physical Exam: Filed Vitals:   11/20/14 0836  BP: 148/84  Pulse: 58  Height: 6' (1.829 m)  Weight: 191 lb 12.8 oz (87 kg)    GEN- The patient is well appearing, alert and oriented x 3 today.   Head- normocephalic, atraumatic Eyes-  Sclera clear, conjunctiva pink Ears- hearing intact Oropharynx- clear Neck- supple, no JVP Lymph- no cervical lymphadenopathy Lungs- Clear to ausculation bilaterally, normal work of breathing Heart- Regular rate and rhythm, no murmurs, rubs or gallops, PMI not laterally displaced GI- soft, NT, ND, + BS Extremities- no clubbing, cyanosis, or edema MS- no significant deformity or atrophy Skin- no rash or lesion Psych- euthymic mood, full affect Neuro- strength and sensation are intact  EKG- Sinus brady at 58 bpm, otherwise normal ekg. Echo  Left ventricle: The cavity size was normal. Wall thickness was normal. Systolic function was normal. The estimated ejection fraction was in the range of 55% to 60%. Wall motion was normal; there were no regional wall motion abnormalities. Doppler parameters are consistent with abnormal left ventricular relaxation (grade 1 diastolic dysfunction). The E/e&' ratio is between 8-15, suggesting indeterminate LV filling pressure. - Left atrium: The atrium was normal in size. - Right atrium: The atrium was normal in size.     Assessment and Plan: 1. Typical Atrial flutter Therapeutic strategies for aflutter including medicine and ablation were discussed in detail with the patient today. Risk, benefits, and alternatives to EP study and radiofrequency ablation for aflutter were also discussed in detail today. These risks include but are not limited to stroke, bleeding, vascular damage, tamponade, need for  PPM, , worsening renal function, and death. The patient understands these risk and wishes to proceed.    2. Chadsvasc score of 1-2 Continue eliquis 5 mg bid without missed doses. Likely will not need long term anticoagulation.  3. HTN Well controlled today with repeat BP of 120/70.  4. Obesity Regular exercise and wt loss recommended Encouraged reduction of alcohol use to 1-2 drinks a week.  Marland Kitchen

## 2014-11-24 NOTE — Progress Notes (Signed)
Doing well s/p atrial flutter ablation As he presented in sinus today, I will stop eliquis.  Stop Metoprolol   Routine wound care and follow-up Return to see me in 4 weeks

## 2014-11-24 NOTE — Transfer of Care (Signed)
Immediate Anesthesia Transfer of Care Note  Patient: William Phillips  Procedure(s) Performed: Procedure(s): ATRIAL FLUTTER ABLATION (N/A)  Patient Location: Cath Lab  Anesthesia Type:General  Level of Consciousness: awake, alert  and oriented  Airway & Oxygen Therapy: Patient Spontanous Breathing  Post-op Assessment: Report given to RN, Post -op Vital signs reviewed and stable and Patient moving all extremities X 4  Post vital signs: Reviewed and stable  Last Vitals:  Filed Vitals:   11/24/14 0600  BP: 123/77  Pulse: 59  Temp: 36.7 C  Resp: 16    Complications: No apparent anesthesia complications

## 2014-11-30 LAB — BASIC METABOLIC PANEL WITH GFR
BUN: 15 mg/dL (ref 6–23)
CO2: 29 meq/L (ref 19–32)
Calcium: 9.3 mg/dL (ref 8.4–10.5)
Chloride: 101 mEq/L (ref 96–112)
Creat: 0.77 mg/dL (ref 0.50–1.35)
GFR, Est African American: >89 mL/min
GFR, Est Non African American: >89 mL/min
Glucose, Bld: 98 mg/dL (ref 70–99)
Potassium: 4 mEq/L (ref 3.5–5.3)
SODIUM: 138 meq/L (ref 135–145)

## 2014-11-30 LAB — CBC
HCT: 41.8 % (ref 39.0–52.0)
Hemoglobin: 14.1 g/dL (ref 13.0–17.0)
MCH: 31.9 pg (ref 26.0–34.0)
MCHC: 33.7 g/dL (ref 30.0–36.0)
MCV: 94.6 fL (ref 78.0–100.0)
MPV: 10.3 fL (ref 8.6–12.4)
PLATELETS: 276 10*3/uL (ref 150–400)
RBC: 4.42 MIL/uL (ref 4.22–5.81)
RDW: 12.6 % (ref 11.5–15.5)
WBC: 7.9 10*3/uL (ref 4.0–10.5)

## 2014-12-03 ENCOUNTER — Telehealth: Payer: Self-pay | Admitting: Physician Assistant

## 2014-12-03 NOTE — Telephone Encounter (Signed)
    Patient has a history of atrial flutter first diagnosed in 09/2014. This has been asymptomatic. He was placed on Eliquis and metoprolol. He underwent RF ablation on 11/24/14 by Dr. Rayann Heman and his Eliquis and metoprolol were discontinued as he presented in sinus. He takes his HR regularly at home and noted today that his HR was in 130s-140s. He is again, asymptomatic. He and his wife wanted to know if he should resume eliquis and BB as he is likely back in flutter. I felt this was a good idea and advised him to call the office in the AM to make an appointment with EP.   Angelena Form PA-C  [facility]

## 2014-12-04 ENCOUNTER — Telehealth: Payer: Self-pay | Admitting: Internal Medicine

## 2014-12-04 NOTE — Telephone Encounter (Signed)
Reviewed with Kathrene Alu -  Cecille Rubin recommended that pt take Eliquis 5mg  bid reqularly and restart metoprolol ER 50mg  daily, schedule appt with Ceasar Lund in follow-up.  Pt advised, verbalized understanding, appt scheduled 12/05/14 11:30AM with Roderic Palau, NP (no office schedule today).

## 2014-12-04 NOTE — Telephone Encounter (Signed)
New problem   Pt stated his pulse rate yesterday was 138-140 and he want to speak to nurse concerning this. Please call pt.

## 2014-12-04 NOTE — Telephone Encounter (Signed)
Pt states his heart rate was 140-149 about noon yesterday.  Pt called and spoke with call PA. Pt states he was advised to take a metoprolol ER 25mg  and Eliquis about 6PM yesterday.  Pt states heart rate 8PM--128. Pt states heart rate has not been lower than 123 since yesterday. Pt denies lightheadedness or dizziness, but is concerned because his heart rate is staying in the 120 range.  Pt states he took Eliquis this morning but no other medications.   Pt states he was discharged after ablation on Quinipril 20/12.5 daily and ASA daily, he was advised yesterday to restart Eliquis regularly.   Pt advised I will review with Flex PA/DOD.

## 2014-12-05 ENCOUNTER — Ambulatory Visit (INDEPENDENT_AMBULATORY_CARE_PROVIDER_SITE_OTHER): Payer: 59 | Admitting: Nurse Practitioner

## 2014-12-05 ENCOUNTER — Encounter: Payer: Self-pay | Admitting: Nurse Practitioner

## 2014-12-05 VITALS — BP 112/78 | HR 65 | Ht 72.0 in | Wt 189.8 lb

## 2014-12-05 DIAGNOSIS — I4892 Unspecified atrial flutter: Secondary | ICD-10-CM

## 2014-12-05 DIAGNOSIS — I471 Supraventricular tachycardia: Secondary | ICD-10-CM

## 2014-12-05 NOTE — Progress Notes (Addendum)
Primary Care Physician: Penni Homans, MD Referring Physician:Dr. Jerron Niblack is a 64 y.o. male with a h/o atrial flutter first diagnosed in December of 2015 when being evaluated for an foot injury from which pt was unaware of the rhythm. He was sent to the ER in [facility] but he spontaneously converted. He was referred to Dr. Stanford Breed for further evaluation and found to be in a. Flutter, again asymptomatic. Placed on eliquis for HTN and borderline DM, amlodipine stopped and metoprolol succinate started and referred here for ablation. Echo without significant structural abnormalities.  He underwent successful a.flutter ablation on 11/24/14 and did well until Sunday am when he noticed fast heart rate. By his monitor at home, ventricular rates were as high as 140. He restarted asa and after speaking to someone at the office, started back on 25 mg toprol xl. His heart rate slowed back to normal Sunday pm but again Monday am he developed fast heart rate. He spoke to someone at the office again and was advised to increase Toprol XL to 50 mg a day and resume eliquis. Yesterday and today so far has not anymore aflutter. EKG shows sinus rhythm.   Today, he denies symptoms of palpitations, chest pain, shortness of breath, orthopnea, PND, lower extremity edema, dizziness, presyncope, syncope, or neurologic sequela. States he snores but wife does not report any apneic episodes and he does not have any other symptoms of sleep apnea. The patient is tolerating medications without difficulties and is otherwise without complaint today.   Past Medical History  Diagnosis Date  . Allergic rhinitis   . Hyperlipidemia   . Hypertension   . History of carpal tunnel syndrome   . Ankle fracture 1998  . OA (osteoarthritis) 02/06/2013    knees  . Hammer toe of right foot 05/06/2013  . GERD (gastroesophageal reflux disease)    Past Surgical History  Procedure Laterality Date  . Inguinal hernia  repair  2008  . Colonoscopy  08/04/2002    Normal Exam- Dr Henrene Pastor  . Carpel tunnel    . Ankle surgery    . Tonsillectomy      Current Outpatient Prescriptions  Medication Sig Dispense Refill  . apixaban (ELIQUIS) 5 MG TABS tablet Take 1 tablet (5 mg total) by mouth 2 (two) times daily. 60 tablet 6  . metoprolol succinate (TOPROL XL) 25 MG 24 hr tablet Take 1 tablet (25 mg total) by mouth daily. 90 tablet 3  . Multiple Vitamin (MULTIVITAMIN) tablet Take 1 tablet by mouth daily.      Marland Kitchen omeprazole (PRILOSEC) 20 MG capsule TAKE 1 CAPSULE BY MOUTH EVERY DAY 15 MINUTES BEFORE MORNING MEAL AS NEEDED (Patient taking differently: TAKE 1 CAPSULE BY MOUTH EVERY DAY 15 MINUTES BEFORE MORNING MEAL AS NEEDED FOR HEARTBURN OR ACID REFLUX) 90 capsule 1  . quinapril-hydrochlorothiazide (ACCURETIC) 20-12.5 MG per tablet TAKE ONE TABLET BY MOUTH ONCE DAILY 90 tablet 1  . sildenafil (VIAGRA) 100 MG tablet Take 1 tablet (100 mg total) by mouth as needed for erectile dysfunction. 1 tablet by mouth once a day as needed 10 tablet 1  . simvastatin (ZOCOR) 20 MG tablet TAKE ONE TABLET BY MOUTH AT BEDTIME 90 tablet 1   No current facility-administered medications for this visit.    Allergies  Allergen Reactions  . Penicillins     REACTION: swelling , irriation    History   Social History  . Marital Status: Married    Spouse  Name: N/A    Number of Children: 1  . Years of Education: N/A   Occupational History  .      Sales   Social History Main Topics  . Smoking status: Former Research scientist (life sciences)  . Smokeless tobacco: Never Used     Comment: 5-10 pack year history  . Alcohol Use: Yes     Comment: 2-3 glasses of wine  . Drug Use: Not on file  . Sexual Activity: Yes   Other Topics Concern  . 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 Hyperlipidemia    . Hypertension    . Coronary artery disease Father     aortic valve disease  . Heart attack Father 40  . COPD Father   . Hypertension Father   . Hyperlipidemia Father   . Dementia Father   . Diabetes type II Maternal Grandfather   . Arthritis Mother   . Diabetes Sister   . Diabetes Brother     ROS- All systems are reviewed and negative except as per the HPI above  Physical Exam: 12/05/14     BP 112/78  HR 65      191 lb 12.8 oz (87 kg)    GEN- The patient is well appearing, alert and oriented x 3 today.   Head- normocephalic, atraumatic Eyes-  Sclera clear, conjunctiva pink Ears- hearing intact Oropharynx- clear Neck- supple, no JVP Lymph- no cervical lymphadenopathy Lungs- Clear to ausculation bilaterally, normal work of breathing Heart- Regular rate and rhythm, no murmurs, rubs or gallops, PMI not laterally displaced GI- soft, NT, ND, + BS Extremities- no clubbing, cyanosis, or edema MS- no significant deformity or atrophy Skin- no rash or lesion Psych- euthymic mood, full affect Neuro- strength and sensation are intact  EKG- Sinus rhythm at 65 bpm, Normal EKG. Echo  Left ventricle: The cavity size was normal. Wall thickness was normal. Systolic function was normal. The estimated ejection fraction was in the range of 55% to 60%. Wall motion was normal; there were no regional wall motion abnormalities. Doppler parameters are consistent with abnormal left ventricular relaxation (grade 1 diastolic dysfunction). The E/e&' ratio is between 8-15, suggesting indeterminate LV filling pressure. - Left atrium: The atrium was normal in size. - Right atrium: The atrium was normal in size.     Assessment and Plan: 1. Typical Atrial flutter Underwent aflutter ablation 2/12 but with episodes of  undocumented recurrent tachycardia, now quiet with  use of metoprolol. Will discuss with Dr. Rayann Heman for next steps with recent ablation. Continue metoprolol for now.  2. Chadsvasc score of 1-2 Eliquis was stopped but pt restarted with recent tachycardia.  Stop asa and continue eliquis for now. .  3. HTN Well controlled today.  4. Obesity Regular exercise and wt loss recommended Encouraged reduction of alcohol use to 1-2 drinks a week.  I will contact pt after I have been able to discuss with Dr. Rayann Heman. Otherwise, f/u with Dr. Rayann Heman as scheduled 3/16.  . 2/24 Spoke with Dr. Rayann Heman. For now continue with BB and Noac until he has f/u as scheduled in March. Pt made aware and instructed to notify us if  anymore issues before that appointment.

## 2014-12-05 NOTE — Patient Instructions (Signed)
Keep your upcoming appointment with Dr. Rayann Heman in March. Butch Penny will speak with Dr. Rayann Heman tomorrow and call you regarding his advice.  Your physician has recommended you make the following change in your medication: 1) Stop the Aspirin 2) continue eliquis and toprol as prescribed

## 2014-12-21 DIAGNOSIS — M79673 Pain in unspecified foot: Secondary | ICD-10-CM

## 2014-12-22 ENCOUNTER — Ambulatory Visit: Payer: Self-pay | Admitting: Family Medicine

## 2014-12-27 ENCOUNTER — Ambulatory Visit (INDEPENDENT_AMBULATORY_CARE_PROVIDER_SITE_OTHER): Payer: 59 | Admitting: Internal Medicine

## 2014-12-27 ENCOUNTER — Other Ambulatory Visit: Payer: Self-pay

## 2014-12-27 ENCOUNTER — Encounter: Payer: Self-pay | Admitting: Internal Medicine

## 2014-12-27 VITALS — BP 126/82 | HR 56 | Ht 72.0 in | Wt 184.4 lb

## 2014-12-27 DIAGNOSIS — I10 Essential (primary) hypertension: Secondary | ICD-10-CM

## 2014-12-27 DIAGNOSIS — I4892 Unspecified atrial flutter: Secondary | ICD-10-CM

## 2014-12-27 MED ORDER — METOPROLOL SUCCINATE ER 25 MG PO TB24: 12.5000 mg | ORAL_TABLET | Freq: Every day | ORAL | Status: DC

## 2014-12-27 NOTE — Patient Instructions (Signed)
Your physician recommends that you schedule a follow-up appointment as needed   Your physician has recommended you make the following change in your medication:  1) Stop Eliquis 2) Decrease Metoprolol to 12.5 mg daily

## 2014-12-31 NOTE — Progress Notes (Signed)
Electrophysiology Office Note   Date:  12/31/2014   ID:  William Phillips, DOB 1951/02/14, MRN [id number]  PCP:  Penni Homans, MD  Cardiologist:  Dr Stanford Breed Primary Electrophysiologist: Thompson Grayer, MD    Chief Complaint  Patient presents with  . Follow-up    PSVT & atrial flutter     History of Present Illness: William Phillips is a 64 y.o. male who presents today for electrophysiology evaluation.   The patient has done well since his ablation.  Though he had a few palpitations early post ablation, he has had no further symptoms of arrhythmia.  He is pleased with the results of his procedure.  He denies procedure related complications.  Today, he denies symptoms of chest pain, shortness of breath, orthopnea, PND, lower extremity edema, claudication, dizziness, presyncope, syncope, bleeding, or neurologic sequela. The patient is tolerating medications without difficulties and is otherwise without complaint today.    Past Medical History  Diagnosis Date  . Allergic rhinitis   . Hyperlipidemia   . Hypertension   . History of carpal tunnel syndrome   . Ankle fracture 1998  . OA (osteoarthritis) 02/06/2013    knees  . Hammer toe of right foot 05/06/2013  . GERD (gastroesophageal reflux disease)   . Atrial flutter     a. s/p CTI ablation by Dr Rayann Heman 11/2014   Past Surgical History  Procedure Laterality Date  . Inguinal hernia repair  2008  . Colonoscopy  08/04/2002    Normal Exam- Dr Henrene Pastor  . Carpel tunnel    . Ankle surgery    . Tonsillectomy    . Ablation  11-24-14    CTI ablation by Dr Rayann Heman  . Atrial flutter ablation N/A 11/24/2014    Procedure: ATRIAL FLUTTER ABLATION;  Surgeon: Thompson Grayer, MD;  Location: [facility] CATH LAB;  Service: Cardiovascular;  Laterality: N/A;     Current Outpatient Prescriptions  Medication Sig Dispense Refill  . metoprolol succinate (TOPROL-XL) 25 MG 24 hr tablet Take 0.5 tablets (12.5 mg total) by mouth daily.    . Multiple Vitamin  (MULTIVITAMIN) tablet Take 1 tablet by mouth daily.      Marland Kitchen omeprazole (PRILOSEC) 20 MG capsule TAKE 1 CAPSULE BY MOUTH EVERY DAY 15 MINUTES BEFORE MORNING MEAL AS NEEDED (Patient taking differently: TAKE 1 CAPSULE BY MOUTH EVERY DAY 15 MINUTES BEFORE MORNING MEAL AS NEEDED FOR HEARTBURN OR ACID REFLUX) 90 capsule 1  . quinapril-hydrochlorothiazide (ACCURETIC) 20-12.5 MG per tablet TAKE ONE TABLET BY MOUTH ONCE DAILY 90 tablet 1  . sildenafil (VIAGRA) 100 MG tablet Take 1 tablet (100 mg total) by mouth as needed for erectile dysfunction. 1 tablet by mouth once a day as needed 10 tablet 1  . simvastatin (ZOCOR) 20 MG tablet TAKE ONE TABLET BY MOUTH AT BEDTIME 90 tablet 1   No current facility-administered medications for this visit.    Allergies:   Penicillins   Social History:  The patient  reports that he has quit smoking. He has never used smokeless tobacco. He reports that he drinks alcohol.   Family History:  The patient's  family history includes Arthritis in his mother; COPD in his father; Coronary artery disease in his father; Dementia in his father; Diabetes in his brother and sister; Diabetes type II in his maternal grandfather; Heart attack (age of onset: 62) in his father; Hyperlipidemia in his father and another family member; Hypertension in his father and another family member.    ROS:  Please see  the history of present illness.   All other systems are reviewed and negative.    PHYSICAL EXAM: VS:  BP 126/82 mmHg  Pulse 56  Ht 6' (1.829 m)  Wt 184 lb 6.4 oz (83.643 kg)  BMI 25.00 kg/m2 , BMI Body mass index is 25 kg/(m^2). GEN: Well nourished, well developed, in no acute distress HEENT: normal Neck: no JVD, carotid bruits, or masses Cardiac: RRR; no murmurs, rubs, or gallops,no edema  Respiratory:  clear to auscultation bilaterally, normal work of breathing GI: soft, nontender, nondistended, + BS MS: no deformity or atrophy Skin: warm and dry  Neuro:  Strength and  sensation are intact Psych: euthymic mood, full affect  EKG:  EKG is ordered today. The ekg ordered today shows sinus bradycardia 56 bpm, otherwise normal ekg   Recent Labs: 09/25/2014: ALT 23; TSH 0.983 11/29/2014: BUN 15; Creatinine 0.77; Hemoglobin 14.1; Platelets 276; Potassium 4.0; Sodium 138    Lipid Panel     Component Value Date/Time   CHOL 178 06/20/2014 0939   TRIG 111.0 06/20/2014 0939   HDL 64.30 06/20/2014 0939   CHOLHDL 3 06/20/2014 0939   VLDL 22.2 06/20/2014 0939   LDLCALC 92 06/20/2014 0939     Wt Readings from Last 3 Encounters:  12/27/14 184 lb 6.4 oz (83.643 kg)  12/05/14 189 lb 12.8 oz (86.093 kg)  11/24/14 185 lb (83.915 kg)      ASSESSMENT AND PLAN:  1.  Atrial flutter Doing well s/p ablation without recurrent No documented afib Stop eliquis at this time Reduce Toprol to 12.5mg  daily If he continues to do well, perhaps this can also be discontinued.  2. htn Stable No change required today  Return to see me as needed   Labs/ tests ordered today include:  Orders Placed This Encounter  Procedures  . EKG 12-Lead   Signed, Thompson Grayer, MD  12/31/2014 9:16 PM     [address] [place] [address] [phone] (office) [phone] (fax)

## 2015-01-01 ENCOUNTER — Ambulatory Visit (INDEPENDENT_AMBULATORY_CARE_PROVIDER_SITE_OTHER): Payer: 59 | Admitting: Family Medicine

## 2015-01-01 ENCOUNTER — Encounter: Payer: Self-pay | Admitting: Family Medicine

## 2015-01-01 VITALS — BP 118/72 | HR 62 | Temp 98.0°F | Resp 16 | Ht 72.0 in | Wt 182.0 lb

## 2015-01-01 DIAGNOSIS — R739 Hyperglycemia, unspecified: Secondary | ICD-10-CM | POA: Diagnosis not present

## 2015-01-01 DIAGNOSIS — R000 Tachycardia, unspecified: Secondary | ICD-10-CM

## 2015-01-01 DIAGNOSIS — K219 Gastro-esophageal reflux disease without esophagitis: Secondary | ICD-10-CM

## 2015-01-01 DIAGNOSIS — E782 Mixed hyperlipidemia: Secondary | ICD-10-CM | POA: Diagnosis not present

## 2015-01-01 DIAGNOSIS — N529 Male erectile dysfunction, unspecified: Secondary | ICD-10-CM

## 2015-01-01 DIAGNOSIS — I10 Essential (primary) hypertension: Secondary | ICD-10-CM | POA: Diagnosis not present

## 2015-01-01 DIAGNOSIS — R7989 Other specified abnormal findings of blood chemistry: Secondary | ICD-10-CM

## 2015-01-01 LAB — LIPID PANEL
CHOLESTEROL: 136 mg/dL (ref 0–200)
HDL: 47.9 mg/dL (ref >39.00)
LDL CALC: 71 mg/dL (ref 0–99)
NonHDL: 88.1
TRIGLYCERIDES: 88 mg/dL (ref 0.0–149.0)
Total CHOL/HDL Ratio: 3
VLDL: 17.6 mg/dL (ref 0.0–40.0)

## 2015-01-01 LAB — COMPREHENSIVE METABOLIC PANEL
ALBUMIN: 4.1 g/dL (ref 3.5–5.2)
ALT: 12 U/L (ref 0–53)
AST: 16 U/L (ref 0–37)
Alkaline Phosphatase: 45 U/L (ref 39–117)
BUN: 14 mg/dL (ref 6–23)
CALCIUM: 9.5 mg/dL (ref 8.4–10.5)
CO2: 29 mEq/L (ref 19–32)
Chloride: 100 mEq/L (ref 96–112)
Creatinine, Ser: 0.94 mg/dL (ref 0.40–1.50)
GFR: 86.23 mL/min (ref >60.00)
Glucose, Bld: 137 mg/dL — ABNORMAL HIGH (ref 70–99)
Potassium: 4 mEq/L (ref 3.5–5.1)
Sodium: 135 mEq/L (ref 135–145)
Total Bilirubin: 0.5 mg/dL (ref 0.2–1.2)
Total Protein: 6.8 g/dL (ref 6.0–8.3)

## 2015-01-01 LAB — CBC
HCT: 43.2 % (ref 39.0–52.0)
Hemoglobin: 14.8 g/dL (ref 13.0–17.0)
MCHC: 34.2 g/dL (ref 30.0–36.0)
MCV: 92.2 fl (ref 78.0–100.0)
PLATELETS: 227 10*3/uL (ref 150.0–400.0)
RBC: 4.68 Mil/uL (ref 4.22–5.81)
RDW: 13.2 % (ref 11.5–15.5)
WBC: 5.4 10*3/uL (ref 4.0–10.5)

## 2015-01-01 LAB — TSH: TSH: 1.57 u[IU]/mL (ref 0.35–4.50)

## 2015-01-01 LAB — HEMOGLOBIN A1C: Hgb A1c MFr Bld: 5.6 % (ref 4.6–6.5)

## 2015-01-01 MED ORDER — SILDENAFIL CITRATE 100 MG PO TABS: 100.0000 mg | ORAL_TABLET | ORAL | Status: DC | PRN

## 2015-01-01 NOTE — Progress Notes (Signed)
DEBRA CALABRETTA  [id number] Aug 04, 1951 01/01/2015      Progress Note-Follow Up  Subjective  Chief Complaint  Chief Complaint  Patient presents with  . Follow-up    hypertension (pt states normal readings at home), cardiac atrial ablation on 11/24/14    HPI  Patient is a 64 y.o. male in today for routine medical care. Patient is feeling better status post ablation. He's had no recurring symptoms. He denies any syncope or recent illness. Denies CP/palp/SOB/HA/congestion/fevers/GI or GU c/o. Taking meds as prescribed  Past Medical History  Diagnosis Date  . Allergic rhinitis   . Hyperlipidemia   . Hypertension   . History of carpal tunnel syndrome   . Ankle fracture 1998  . OA (osteoarthritis) 02/06/2013    knees  . Hammer toe of right foot 05/06/2013  . GERD (gastroesophageal reflux disease)   . Atrial flutter     a. s/p CTI ablation by Dr Rayann Heman 11/2014    Past Surgical History  Procedure Laterality Date  . Inguinal hernia repair  2008  . Colonoscopy  08/04/2002    Normal Exam- Dr Henrene Pastor  . Carpel tunnel    . Ankle surgery    . Tonsillectomy    . Ablation  11-24-14    CTI ablation by Dr Rayann Heman  . Atrial flutter ablation N/A 11/24/2014    Procedure: ATRIAL FLUTTER ABLATION;  Surgeon: Thompson Grayer, MD;  Location: [facility] CATH LAB;  Service: Cardiovascular;  Laterality: N/A;    Family History  Problem Relation Age of Onset  . Hyperlipidemia    . Hypertension    . Coronary artery disease Father     aortic valve disease  . Heart attack Father 47  . COPD Father   . Hypertension Father   . Hyperlipidemia Father   . Dementia Father   . Diabetes type II Maternal Grandfather   . Arthritis Mother   . Diabetes Sister   . Diabetes Brother     History   Social History  . Marital Status: Married    Spouse Name: N/A  . Number of Children: 1  . Years of Education: N/A   Occupational History  .      Sales   Social History Main Topics  . Smoking status: Former Research scientist (life sciences)    . Smokeless tobacco: Never Used     Comment: 5-10 pack year history  . Alcohol Use: Yes     Comment: 2-3 glasses of wine  . Drug Use: Not on file  . Sexual Activity: Yes   Other Topics Concern  . 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 metoprolol succinate (TOPROL-XL) 25 MG 24 hr tablet Take 0.5 tablets (12.5 mg total) by mouth daily.    . Multiple Vitamin (MULTIVITAMIN) tablet Take 1 tablet by mouth daily.      Marland Kitchen omeprazole (PRILOSEC) 20 MG capsule TAKE 1 CAPSULE BY MOUTH EVERY DAY 15 MINUTES BEFORE MORNING MEAL AS NEEDED (Patient taking differently: TAKE 1 CAPSULE BY MOUTH EVERY DAY 15 MINUTES BEFORE MORNING MEAL AS NEEDED FOR HEARTBURN OR ACID REFLUX) 90 capsule 1  . quinapril-hydrochlorothiazide (ACCURETIC) 20-12.5 MG  per tablet TAKE ONE TABLET BY MOUTH ONCE DAILY 90 tablet 1  . simvastatin (ZOCOR) 20 MG tablet TAKE ONE TABLET BY MOUTH AT BEDTIME 90 tablet 1   No current facility-administered medications on file prior to visit.    Allergies  Allergen Reactions  . Penicillins     REACTION: swelling , irriation    Review of Systems  Review of Systems  Constitutional: Negative for fever and malaise/fatigue.  HENT: Negative for congestion.   Eyes: Negative for discharge.  Respiratory: Negative for shortness of breath.   Cardiovascular: Negative for chest pain, palpitations and leg swelling.  Gastrointestinal: Negative for nausea, abdominal pain and diarrhea.  Genitourinary: Negative for dysuria.  Musculoskeletal: Negative for falls.  Skin: Negative for rash.  Neurological: Negative for loss of consciousness and headaches.  Endo/Heme/Allergies: Negative for  polydipsia.  Psychiatric/Behavioral: Negative for depression and suicidal ideas. The patient is not nervous/anxious and does not have insomnia.     Objective  BP 118/72 mmHg  Pulse 62  Temp(Src) 98 F (36.7 C) (Oral)  Resp 16  Ht 6' (1.829 m)  Wt 182 lb (82.555 kg)  BMI 24.68 kg/m2  SpO2 100%  Physical Exam  Physical Exam  Constitutional: He is oriented to person, place, and time and well-developed, well-nourished, and in no distress. No distress.  HENT:  Head: Normocephalic and atraumatic.  Eyes: Conjunctivae are normal.  Neck: Neck supple. No thyromegaly present.  Cardiovascular: Normal rate, regular rhythm and normal heart sounds.   No murmur heard. Pulmonary/Chest: Effort normal and breath sounds normal. No respiratory distress.  Abdominal: He exhibits no distension and no mass. There is no tenderness.  Musculoskeletal: He exhibits no edema.  Neurological: He is alert and oriented to person, place, and time.  Skin: Skin is warm.  Psychiatric: Memory, affect and judgment normal.    Lab Results  Component Value Date   TSH 0.983 09/25/2014   Lab Results  Component Value Date   WBC 7.9 11/29/2014   HGB 14.1 11/29/2014   HCT 41.8 11/29/2014   MCV 94.6 11/29/2014   PLT 276 11/29/2014   Lab Results  Component Value Date   CREATININE 0.77 11/29/2014   BUN 15 11/29/2014   NA 138 11/29/2014   K 4.0 11/29/2014   CL 101 11/29/2014   CO2 29 11/29/2014   Lab Results  Component Value Date   ALT 23 09/25/2014   AST 24 09/25/2014   ALKPHOS 66 09/25/2014   BILITOT 0.6 09/25/2014   Lab Results  Component Value Date   CHOL 178 06/20/2014   Lab Results  Component Value Date   HDL 64.30 06/20/2014   Lab Results  Component Value Date   LDLCALC 92 06/20/2014   Lab Results  Component Value Date   TRIG 111.0 06/20/2014   Lab Results  Component Value Date   CHOLHDL 3 06/20/2014     Assessment & Plan  Essential hypertension Well controlled, no changes to  meds. Encouraged heart healthy diet such as the DASH diet and exercise as tolerated. Similar numbers at home   GERD Avoid offending foods, start probiotics. Do not eat large meals in late evening and consider raising head of bed.    Hyperglycemia Well controlled, no changes to meds. Encouraged heart healthy diet such as the DASH diet and exercise as tolerated.    Hyperlipidemia, mixed Tolerating statin, encouraged heart healthy diet, avoid trans fats, minimize simple carbs and saturated fats. Increase exercise as tolerated   Tachycardia RRR s/p ablation

## 2015-01-01 NOTE — Assessment & Plan Note (Signed)
Tolerating statin, encouraged heart healthy diet, avoid trans fats, minimize simple carbs and saturated fats. Increase exercise as tolerated 

## 2015-01-01 NOTE — Assessment & Plan Note (Signed)
Well controlled, no changes to meds. Encouraged heart healthy diet such as the DASH diet and exercise as tolerated. Similar numbers at home

## 2015-01-01 NOTE — Assessment & Plan Note (Signed)
Avoid offending foods, start probiotics. Do not eat large meals in late evening and consider raising head of bed.  

## 2015-01-01 NOTE — Assessment & Plan Note (Signed)
RRR s/p ablation

## 2015-01-01 NOTE — Progress Notes (Signed)
Pre visit review using our clinic review tool, if applicable. No additional management support is needed unless otherwise documented below in the visit note. 

## 2015-01-01 NOTE — Patient Instructions (Addendum)
Revatio/Sildenafil 20 mg tabs, 1 to 4 tabs as needed daily 35 tabs for 50 or 50 tabs for 35 at [email or web address]  Basic Carbohydrate Counting for Diabetes Mellitus Carbohydrate counting is a method for keeping track of the amount of carbohydrates you eat. Eating carbohydrates naturally increases the level of sugar (glucose) in your blood, so it is important for you to know the amount that is okay for you to have in every meal. Carbohydrate counting helps keep the level of glucose in your blood within normal limits. The amount of carbohydrates allowed is different for every person. A dietitian can help you calculate the amount that is right for you. Once you know the amount of carbohydrates you can have, you can count the carbohydrates in the foods you want to eat. Carbohydrates are found in the following foods:  Grains, such as breads and cereals.  Dried beans and soy products.  Starchy vegetables, such as potatoes, peas, and corn.  Fruit and fruit juices.  Milk and yogurt.  Sweets and snack foods, such as cake, cookies, candy, chips, soft drinks, and fruit drinks. CARBOHYDRATE COUNTING There are two ways to count the carbohydrates in your food. You can use either of the methods or a combination of both. Reading the "Nutrition Facts" on [place] The "Nutrition Facts" is an area that is included on the labels of almost all packaged food and beverages in the [place]. It includes the serving size of that food or beverage and information about the nutrients in each serving of the food, including the grams (g) of carbohydrate per serving.  Decide the number of servings of this food or beverage that you will be able to eat or drink. Multiply that number of servings by the number of grams of carbohydrate that is listed on the label for that serving. The total will be the amount of carbohydrates you will be having when you eat or drink this food or beverage. Learning Standard Serving Sizes  of Food When you eat food that is not packaged or does not include "Nutrition Facts" on the label, you need to measure the servings in order to count the amount of carbohydrates.A serving of most carbohydrate-rich foods contains about 15 g of carbohydrates. The following list includes serving sizes of carbohydrate-rich foods that provide 15 g ofcarbohydrate per serving:   1 slice of bread (1 oz) or 1 six-inch tortilla.    of a hamburger bun or English muffin.  4-6 crackers.   cup unsweetened dry cereal.    cup hot cereal.   cup rice or pasta.    cup mashed potatoes or  of a large baked potato.  1 cup fresh fruit or one small piece of fruit.    cup canned or frozen fruit or fruit juice.  1 cup milk.   cup plain fat-free yogurt or yogurt sweetened with artificial sweeteners.   cup cooked dried beans or starchy vegetable, such as peas, corn, or potatoes.  Decide the number of standard-size servings that you will eat. Multiply that number of servings by 15 (the grams of carbohydrates in that serving). For example, if you eat 2 cups of strawberries, you will have eaten 2 servings and 30 g of carbohydrates (2 servings x 15 g = 30 g). For foods such as soups and casseroles, in which more than one food is mixed in, you will need to count the carbohydrates in each food that is included. EXAMPLE OF CARBOHYDRATE COUNTING Sample Dinner  3 oz  chicken breast.   cup of brown rice.   cup of corn.  1 cup milk.   1 cup strawberries with sugar-free whipped topping.  Carbohydrate Calculation Step 1: Identify the foods that contain carbohydrates:   Rice.   Corn.   Milk.   Strawberries. Step 2:Calculate the number of servings eaten of each:   2 servings of rice.   1 serving of corn.   1 serving of milk.   1 serving of strawberries. Step 3: Multiply each of those number of servings by 15 g:   2 servings of rice x 15 g = 30 g.   1 serving of corn x  15 g = 15 g.   1 serving of milk x 15 g = 15 g.   1 serving of strawberries x 15 g = 15 g. Step 4: Add together all of the amounts to find the total grams of carbohydrates eaten: 30 g + 15 g + 15 g + 15 g = 75 g. Document Released: 09/29/2005 Document Revised: 02/13/2014 Document Reviewed: 08/26/2013 [facility] Patient Information 2015 [place], [place]. This information is not intended to replace advice given to you by your health care provider. Make sure you discuss any questions you have with your health care provider.

## 2015-01-01 NOTE — Assessment & Plan Note (Signed)
Well controlled, no changes to meds. Encouraged heart healthy diet such as the DASH diet and exercise as tolerated.  °

## 2015-01-02 ENCOUNTER — Other Ambulatory Visit: Payer: Self-pay | Admitting: General Practice

## 2015-01-02 ENCOUNTER — Encounter: Payer: Self-pay | Admitting: Family Medicine

## 2015-01-02 MED ORDER — SILDENAFIL CITRATE 20 MG PO TABS: ORAL_TABLET | ORAL | Status: DC

## 2015-01-02 NOTE — Telephone Encounter (Signed)
Med filled.  

## 2015-01-03 MED ORDER — SILDENAFIL CITRATE 20 MG PO TABS: ORAL_TABLET | ORAL | Status: DC

## 2015-01-03 NOTE — Addendum Note (Signed)
Addended by: Kris Hartmann on: 01/03/2015 09:05 AM   Modules accepted: Orders

## 2015-01-03 NOTE — Telephone Encounter (Signed)
Med filled.  

## 2015-01-08 ENCOUNTER — Telehealth: Payer: Self-pay | Admitting: *Deleted

## 2015-01-08 NOTE — Telephone Encounter (Signed)
Prior authorization for sildenafil (Revatio) initiated. Awaiting determination. JG//CMA

## 2015-01-29 NOTE — Progress Notes (Signed)
HPI: FU atrial flutter. Patient seen in the emergency room in December, 2015 with atrial flutter. Follow-up ECG showed he converted to sinus rhythm. Laboratories December 2015 showed a TSH of 0.983. Hemoglobin 16.9. BUN and creatinine 15 and 0.8. Echocardiogram February 2016 showed normal LV function, grade 1 diastolic dysfunction. Had ablation February 2016. Since last seen, the patient denies any dyspnea on exertion, orthopnea, PND, pedal edema, palpitations, syncope or chest pain.   Current Outpatient Prescriptions  Medication Sig Dispense Refill  . aspirin 81 MG tablet Take 81 mg by mouth daily.    . metoprolol succinate (TOPROL-XL) 25 MG 24 hr tablet Take 0.5 tablets (12.5 mg total) by mouth daily.    . Multiple Vitamin (MULTIVITAMIN) tablet Take 1 tablet by mouth daily.      Marland Kitchen omeprazole (PRILOSEC) 20 MG capsule TAKE 1 CAPSULE BY MOUTH EVERY DAY 15 MINUTES BEFORE MORNING MEAL AS NEEDED (Patient taking differently: TAKE 1 CAPSULE BY MOUTH EVERY DAY 15 MINUTES BEFORE MORNING MEAL AS NEEDED FOR HEARTBURN OR ACID REFLUX) 90 capsule 1  . quinapril-hydrochlorothiazide (ACCURETIC) 20-12.5 MG per tablet TAKE ONE TABLET BY MOUTH ONCE DAILY 90 tablet 1  . sildenafil (REVATIO) 20 MG tablet Please take 1-4 tablets by mouth as needed for ED 50 tablet 1  . simvastatin (ZOCOR) 20 MG tablet TAKE ONE TABLET BY MOUTH AT BEDTIME 90 tablet 1   No current facility-administered medications for this visit.     Past Medical History  Diagnosis Date  . Allergic rhinitis   . Hyperlipidemia   . Hypertension   . History of carpal tunnel syndrome   . Ankle fracture 1998  . OA (osteoarthritis) 02/06/2013    knees  . Hammer toe of right foot 05/06/2013  . GERD (gastroesophageal reflux disease)   . Atrial flutter     a. s/p CTI ablation by Dr Rayann Heman 11/2014    Past Surgical History  Procedure Laterality Date  . Inguinal hernia repair  2008  . Colonoscopy  08/04/2002    Normal Exam- Dr Henrene Pastor  .  Carpel tunnel    . Ankle surgery    . Tonsillectomy    . Ablation  11-24-14    CTI ablation by Dr Rayann Heman  . Atrial flutter ablation N/A 11/24/2014    Procedure: ATRIAL FLUTTER ABLATION;  Surgeon: Thompson Grayer, MD;  Location: [facility] CATH LAB;  Service: Cardiovascular;  Laterality: N/A;    History   Social History  . Marital Status: Married    Spouse Name: N/A  . Number of Children: 1  . Years of Education: N/A   Occupational History  .      Sales   Social History Main Topics  . Smoking status: Former Research scientist (life sciences)  . Smokeless tobacco: Never Used     Comment: 5-10 pack year history  . Alcohol Use: Yes     Comment: 2-3 glasses of wine  . Drug Use: Not on file  . Sexual Activity: Yes   Other Topics Concern  . 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 Remaining systems are  negative.  Physical Exam: Well-developed well-nourished in no acute distress.  Skin is warm and dry.  HEENT is normal.  Neck is supple.  Chest is clear to auscultation with normal expansion.  Cardiovascular exam is regular rate and rhythm.  Abdominal exam nontender or distended. No masses palpated. Extremities show no edema. neuro grossly intact  ECG 12/27/2014-sinus bradycardia.

## 2015-01-31 ENCOUNTER — Encounter: Payer: Self-pay | Admitting: Cardiology

## 2015-01-31 ENCOUNTER — Ambulatory Visit (INDEPENDENT_AMBULATORY_CARE_PROVIDER_SITE_OTHER): Payer: 59 | Admitting: Cardiology

## 2015-01-31 VITALS — BP 132/68 | HR 64 | Ht 72.0 in | Wt 184.8 lb

## 2015-01-31 DIAGNOSIS — I10 Essential (primary) hypertension: Secondary | ICD-10-CM | POA: Diagnosis not present

## 2015-01-31 DIAGNOSIS — I483 Typical atrial flutter: Secondary | ICD-10-CM

## 2015-01-31 DIAGNOSIS — E782 Mixed hyperlipidemia: Secondary | ICD-10-CM | POA: Diagnosis not present

## 2015-01-31 NOTE — Assessment & Plan Note (Signed)
Patient is status post ablation. He remains in sinus rhythm on examination. Continue aspirin and Toprol.

## 2015-01-31 NOTE — Patient Instructions (Signed)
Your physician wants you to follow-up in: ONE YEAR WITH DR CRENSHAW You will receive a reminder letter in the mail two months in advance. If you don't receive a letter, please call our office to schedule the follow-up appointment.  

## 2015-01-31 NOTE — Assessment & Plan Note (Signed)
Blood pressure controlled. Continue present medications. 

## 2015-01-31 NOTE — Assessment & Plan Note (Signed)
Continue statin. 

## 2015-02-19 ENCOUNTER — Encounter: Payer: Self-pay | Admitting: Internal Medicine

## 2015-04-23 ENCOUNTER — Encounter: Payer: Self-pay | Admitting: Family Medicine

## 2015-04-23 ENCOUNTER — Ambulatory Visit (INDEPENDENT_AMBULATORY_CARE_PROVIDER_SITE_OTHER): Payer: 59 | Admitting: Family Medicine

## 2015-04-23 VITALS — BP 128/80 | HR 60 | Temp 98.1°F | Ht 72.0 in | Wt 185.1 lb

## 2015-04-23 DIAGNOSIS — R7989 Other specified abnormal findings of blood chemistry: Secondary | ICD-10-CM

## 2015-04-23 DIAGNOSIS — E782 Mixed hyperlipidemia: Secondary | ICD-10-CM | POA: Diagnosis not present

## 2015-04-23 DIAGNOSIS — I483 Typical atrial flutter: Secondary | ICD-10-CM

## 2015-04-23 DIAGNOSIS — K4090 Unilateral inguinal hernia, without obstruction or gangrene, not specified as recurrent: Secondary | ICD-10-CM

## 2015-04-23 DIAGNOSIS — T148 Other injury of unspecified body region: Secondary | ICD-10-CM

## 2015-04-23 DIAGNOSIS — K469 Unspecified abdominal hernia without obstruction or gangrene: Secondary | ICD-10-CM | POA: Insufficient documentation

## 2015-04-23 DIAGNOSIS — R739 Hyperglycemia, unspecified: Secondary | ICD-10-CM

## 2015-04-23 DIAGNOSIS — I10 Essential (primary) hypertension: Secondary | ICD-10-CM

## 2015-04-23 DIAGNOSIS — W57XXXA Bitten or stung by nonvenomous insect and other nonvenomous arthropods, initial encounter: Secondary | ICD-10-CM

## 2015-04-23 HISTORY — DX: Bitten or stung by nonvenomous insect and other nonvenomous arthropods, initial encounter: W57.XXXA

## 2015-04-23 LAB — LIPID PANEL
CHOL/HDL RATIO: 3
Cholesterol: 164 mg/dL (ref 0–200)
HDL: 58.6 mg/dL (ref >39.00)
LDL CALC: 92 mg/dL (ref 0–99)
NonHDL: 105.4
Triglycerides: 69 mg/dL (ref 0.0–149.0)
VLDL: 13.8 mg/dL (ref 0.0–40.0)

## 2015-04-23 LAB — CBC
HCT: 46.1 % (ref 39.0–52.0)
HEMOGLOBIN: 15.7 g/dL (ref 13.0–17.0)
MCHC: 34 g/dL (ref 30.0–36.0)
MCV: 94.3 fl (ref 78.0–100.0)
Platelets: 220 10*3/uL (ref 150.0–400.0)
RBC: 4.89 Mil/uL (ref 4.22–5.81)
RDW: 13.8 % (ref 11.5–15.5)
WBC: 7 10*3/uL (ref 4.0–10.5)

## 2015-04-23 LAB — COMPREHENSIVE METABOLIC PANEL
ALK PHOS: 46 U/L (ref 39–117)
ALT: 16 U/L (ref 0–53)
AST: 17 U/L (ref 0–37)
Albumin: 4.4 g/dL (ref 3.5–5.2)
BUN: 16 mg/dL (ref 6–23)
CALCIUM: 9.5 mg/dL (ref 8.4–10.5)
CHLORIDE: 103 meq/L (ref 96–112)
CO2: 28 mEq/L (ref 19–32)
Creatinine, Ser: 1.02 mg/dL (ref 0.40–1.50)
GFR: 78.39 mL/min (ref >60.00)
GLUCOSE: 115 mg/dL — AB (ref 70–99)
POTASSIUM: 4.2 meq/L (ref 3.5–5.1)
Sodium: 140 mEq/L (ref 135–145)
Total Bilirubin: 0.6 mg/dL (ref 0.2–1.2)
Total Protein: 7.6 g/dL (ref 6.0–8.3)

## 2015-04-23 LAB — TSH: TSH: 1.32 u[IU]/mL (ref 0.35–4.50)

## 2015-04-23 LAB — HEMOGLOBIN A1C: Hgb A1c MFr Bld: 5.5 % (ref 4.6–6.5)

## 2015-04-23 NOTE — Assessment & Plan Note (Signed)
Recheck tsh today.   

## 2015-04-23 NOTE — Patient Instructions (Addendum)
Take quinapril in the a.m. and take metoprolol at night.  Hypertension Hypertension, commonly called high blood pressure, is when the force of blood pumping through your arteries is too strong. Your arteries are the blood vessels that carry blood from your heart throughout your body. A blood pressure reading consists of a higher number over a lower number, such as 110/72. The higher number (systolic) is the pressure inside your arteries when your heart pumps. The lower number (diastolic) is the pressure inside your arteries when your heart relaxes. Ideally you want your blood pressure below 120/80. Hypertension forces your heart to work harder to pump blood. Your arteries may become narrow or stiff. Having hypertension puts you at risk for heart disease, stroke, and other problems.  RISK FACTORS Some risk factors for high blood pressure are controllable. Others are not.  Risk factors you cannot control include:   Race. You may be at higher risk if you are African American.  Age. Risk increases with age.  Gender. Men are at higher risk than women before age 39 years. After age 33, women are at higher risk than men. Risk factors you can control include:  Not getting enough exercise or physical activity.  Being overweight.  Getting too much fat, sugar, calories, or salt in your diet.  Drinking too much alcohol. SIGNS AND SYMPTOMS Hypertension does not usually cause signs or symptoms. Extremely high blood pressure (hypertensive crisis) may cause headache, anxiety, shortness of breath, and nosebleed. DIAGNOSIS  To check if you have hypertension, your health care provider will measure your blood pressure while you are seated, with your arm held at the level of your heart. It should be measured at least twice using the same arm. Certain conditions can cause a difference in blood pressure between your right and left arms. A blood pressure reading that is higher than normal on one occasion does not  mean that you need treatment. If one blood pressure reading is high, ask your health care provider about having it checked again. TREATMENT  Treating high blood pressure includes making lifestyle changes and possibly taking medicine. Living a healthy lifestyle can help lower high blood pressure. You may need to change some of your habits. Lifestyle changes may include:  Following the DASH diet. This diet is high in fruits, vegetables, and whole grains. It is low in salt, red meat, and added sugars.  Getting at least 2 hours of brisk physical activity every week.  Losing weight if necessary.  Not smoking.  Limiting alcoholic beverages.  Learning ways to reduce stress. If lifestyle changes are not enough to get your blood pressure under control, your health care provider may prescribe medicine. You may need to take more than one. Work closely with your health care provider to understand the risks and benefits. HOME CARE INSTRUCTIONS  Have your blood pressure rechecked as directed by your health care provider.   Take medicines only as directed by your health care provider. Follow the directions carefully. Blood pressure medicines must be taken as prescribed. The medicine does not work as well when you skip doses. Skipping doses also puts you at risk for problems.   Do not smoke.   Monitor your blood pressure at home as directed by your health care provider. SEEK MEDICAL CARE IF:   You think you are having a reaction to medicines taken.  You have recurrent headaches or feel dizzy.  You have swelling in your ankles.  You have trouble with your vision. SEEK IMMEDIATE  MEDICAL CARE IF:  You develop a severe headache or confusion.  You have unusual weakness, numbness, or feel faint.  You have severe chest or abdominal pain.  You vomit repeatedly.  You have trouble breathing. MAKE SURE YOU:   Understand these instructions.  Will watch your condition.  Will get help  right away if you are not doing well or get worse. Document Released: 09/29/2005 Document Revised: 02/13/2014 Document Reviewed: 07/22/2013 [facility] Patient Information 2015 [place], [place]. This information is not intended to replace advice given to you by your health care provider. Make sure you discuss any questions you have with your health care provider.

## 2015-04-23 NOTE — Assessment & Plan Note (Signed)
Well controlled, no changes to meds. Encouraged heart healthy diet such as the DASH diet and exercise as tolerated.  °

## 2015-04-23 NOTE — Assessment & Plan Note (Signed)
Tolerated the Omeprazole qod but did not tolerate every 3 rd day has stayed at every other day, probiotics and yogurt most days

## 2015-04-23 NOTE — Assessment & Plan Note (Signed)
Tolerating statin, encouraged heart healthy diet, avoid trans fats, minimize simple carbs and saturated fats. Increase exercise as tolerated 

## 2015-04-23 NOTE — Progress Notes (Signed)
William Phillips  [id number] May 25, 1951 04/23/2015      Progress Note-Follow Up  Subjective  Chief Complaint  Chief Complaint  Patient presents with  . Follow-up    HPI  Patient is a 64 y.o. male in today for routine medical care. Patient has undergone a hernia repair redo since last seen with Dr Johney [place]. Is doing well. No recent illness. No polyuria or polydipsia. Patient notes a lesion on left thigh that is improving for the past week, he has been cleaning with Hydrogen Peroxide and using antibiotic ointment with good results. No fevers, myalgias. Denies CP/palp/SOB/HA/congestion/fevers/GI or GU c/o. Taking meds as prescribed  Past Medical History  Diagnosis Date  . Allergic rhinitis   . Hyperlipidemia   . Hypertension   . History of carpal tunnel syndrome   . Ankle fracture 1998  . OA (osteoarthritis) 02/06/2013    knees  . Hammer toe of right foot 05/06/2013  . GERD (gastroesophageal reflux disease)   . Atrial flutter     a. s/p CTI ablation by Dr Rayann Heman 11/2014    Past Surgical History  Procedure Laterality Date  . Inguinal hernia repair  2008  . Colonoscopy  08/04/2002    Normal Exam- Dr Henrene Pastor  . Carpel tunnel    . Ankle surgery    . Tonsillectomy    . Ablation  11-24-14    CTI ablation by Dr Rayann Heman  . Atrial flutter ablation N/A 11/24/2014    Procedure: ATRIAL FLUTTER ABLATION;  Surgeon: Thompson Grayer, MD;  Location: [facility] CATH LAB;  Service: Cardiovascular;  Laterality: N/A;    Family History  Problem Relation Age of Onset  . Hyperlipidemia    . Hypertension    . Coronary artery disease Father     aortic valve disease  . Heart attack Father 65  . COPD Father   . Hypertension Father   . Hyperlipidemia Father   . Dementia Father   . Diabetes type II Maternal Grandfather   . Arthritis Mother   . Diabetes Sister   . Diabetes Brother     History   Social History  . Marital Status: Married    Spouse Name: N/A  . Number of Children: 1  . Years of  Education: N/A   Occupational History  .      Sales   Social History Main Topics  . Smoking status: Former Research scientist (life sciences)  . Smokeless tobacco: Never Used     Comment: 5-10 pack year history  . Alcohol Use: Yes     Comment: 2-3 glasses of wine  . Drug Use: Not on file  . Sexual Activity: Yes   Other Topics Concern  . 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 aspirin 81 MG tablet Take 81 mg by mouth daily.    . metoprolol succinate (TOPROL-XL) 25 MG 24 hr tablet Take 0.5 tablets (12.5 mg total) by mouth daily.    . Multiple Vitamin (MULTIVITAMIN) tablet Take 1 tablet by mouth daily.      Marland Kitchen omeprazole (PRILOSEC) 20 MG capsule TAKE 1 CAPSULE BY MOUTH EVERY DAY 15 MINUTES BEFORE MORNING MEAL  AS NEEDED (Patient taking differently: TAKE 1 CAPSULE BY MOUTH EVERY DAY 15 MINUTES BEFORE MORNING MEAL AS NEEDED FOR HEARTBURN OR ACID REFLUX) 90 capsule 1  . quinapril-hydrochlorothiazide (ACCURETIC) 20-12.5 MG per tablet TAKE ONE TABLET BY MOUTH ONCE DAILY 90 tablet 1  . sildenafil (REVATIO) 20 MG tablet Please take 1-4 tablets by mouth as needed for ED 50 tablet 1  . simvastatin (ZOCOR) 20 MG tablet TAKE ONE TABLET BY MOUTH AT BEDTIME 90 tablet 1   No current facility-administered medications on file prior to visit.    Allergies  Allergen Reactions  . Penicillins     REACTION: swelling , irriation    Review of Systems  Review of Systems  Constitutional: Negative for fever and malaise/fatigue.  HENT: Negative for congestion.   Eyes: Negative for discharge.  Respiratory: Negative for shortness of breath.   Cardiovascular: Negative for chest pain, palpitations and leg swelling.    Gastrointestinal: Positive for abdominal pain. Negative for nausea and diarrhea.  Genitourinary: Negative for dysuria.  Musculoskeletal: Negative for falls.  Skin: Positive for rash.  Neurological: Negative for loss of consciousness and headaches.  Endo/Heme/Allergies: Negative for polydipsia.  Psychiatric/Behavioral: Negative for depression and suicidal ideas. The patient is not nervous/anxious and does not have insomnia.     Objective  BP 128/80 mmHg  Pulse 60  Temp(Src) 98.1 F (36.7 C) (Oral)  Ht 6' (1.829 m)  Wt 185 lb 2 oz (83.972 kg)  BMI 25.10 kg/m2  SpO2 98%  Physical Exam  Physical Exam  Constitutional: He is oriented to person, place, and time and well-developed, well-nourished, and in no distress. No distress.  HENT:  Head: Normocephalic and atraumatic.  Eyes: Conjunctivae are normal.  Neck: Neck supple. No thyromegaly present.  Cardiovascular: Normal rate, regular rhythm and normal heart sounds.   No murmur heard. Pulmonary/Chest: Effort normal and breath sounds normal. No respiratory distress.  Abdominal: He exhibits no distension and no mass. There is no tenderness.  Musculoskeletal: He exhibits no edema.  Neurological: He is alert and oriented to person, place, and time.  Skin: Skin is warm.  Psychiatric: Memory, affect and judgment normal.    Lab Results  Component Value Date   TSH 1.57 01/01/2015   Lab Results  Component Value Date   WBC 5.4 01/01/2015   HGB 14.8 01/01/2015   HCT 43.2 01/01/2015   MCV 92.2 01/01/2015   PLT 227.0 01/01/2015   Lab Results  Component Value Date   CREATININE 0.94 01/01/2015   BUN 14 01/01/2015   NA 135 01/01/2015   K 4.0 01/01/2015   CL 100 01/01/2015   CO2 29 01/01/2015   Lab Results  Component Value Date   ALT 12 01/01/2015   AST 16 01/01/2015   ALKPHOS 45 01/01/2015   BILITOT 0.5 01/01/2015   Lab Results  Component Value Date   CHOL 136 01/01/2015   Lab Results  Component Value Date   HDL  47.90 01/01/2015   Lab Results  Component Value Date   LDLCALC 71 01/01/2015   Lab Results  Component Value Date   TRIG 88.0 01/01/2015   Lab Results  Component Value Date   CHOLHDL 3 01/01/2015     Assessment & Plan  Hyperlipidemia, mixed Tolerating statin, encouraged heart healthy diet, avoid trans fats, minimize simple carbs and saturated fats. Increase exercise as tolerated  Essential hypertension Well controlled, no changes to meds. Encouraged heart healthy diet such as the DASH diet and exercise as tolerated.  Abnormal TSH Recheck tsh today  Hyperglycemia hgba1c acceptable, minimize simple carbs. Increase exercise as tolerated.   GERD Tolerated the Omeprazole qod but did not tolerate every 3 rd day has stayed at every other day, probiotics and yogurt most days  Atrial flutter Rate controlled s/p ablation, follows with Dr Stanford Breed of cardiology  Insect bite Posterior left thigh, healing, cleanse daily with mild soap and witch hazel and cover with antibiotic ointment  Hernia of abdominal cavity Has had surgical correction in past with Dr Johney [place], has had recurrence of pain in RLQ will refer back to surgery at his request for further consideration   Rash left posterior thigh

## 2015-04-23 NOTE — Assessment & Plan Note (Signed)
Rate controlled s/p ablation, follows with Dr Stanford Breed of cardiology

## 2015-04-23 NOTE — Assessment & Plan Note (Signed)
Has had surgical correction in past with Dr Johney [place], has had recurrence of pain in RLQ will refer back to surgery at his request for further consideration

## 2015-04-23 NOTE — Assessment & Plan Note (Signed)
hgba1c acceptable, minimize simple carbs. Increase exercise as tolerated.  

## 2015-04-23 NOTE — Progress Notes (Signed)
Pre visit review using our clinic review tool, if applicable. No additional management support is needed unless otherwise documented below in the visit note. 

## 2015-04-23 NOTE — Assessment & Plan Note (Signed)
Posterior left thigh, healing, cleanse daily with mild soap and witch hazel and cover with antibiotic ointment

## 2015-04-25 ENCOUNTER — Other Ambulatory Visit: Payer: Self-pay | Admitting: Family Medicine

## 2015-05-06 ENCOUNTER — Encounter: Payer: Self-pay | Admitting: Family Medicine

## 2015-05-21 ENCOUNTER — Ambulatory Visit (INDEPENDENT_AMBULATORY_CARE_PROVIDER_SITE_OTHER): Payer: 59 | Admitting: Physician Assistant

## 2015-05-21 ENCOUNTER — Encounter: Payer: Self-pay | Admitting: Physician Assistant

## 2015-05-21 VITALS — BP 140/76 | HR 60 | Temp 97.7°F | Ht 72.0 in | Wt 189.4 lb

## 2015-05-21 DIAGNOSIS — L200 Besnier's prurigo: Secondary | ICD-10-CM | POA: Diagnosis not present

## 2015-05-21 DIAGNOSIS — L239 Allergic contact dermatitis, unspecified cause: Secondary | ICD-10-CM

## 2015-05-21 MED ORDER — PREDNISONE 10 MG (21) PO TBPK: ORAL_TABLET | ORAL | Status: DC

## 2015-05-21 MED ORDER — TRIAMCINOLONE ACETONIDE 0.1 % EX CREA: 1.0000 "application " | TOPICAL_CREAM | Freq: Two times a day (BID) | CUTANEOUS | Status: DC

## 2015-05-21 NOTE — Progress Notes (Signed)
Pre visit review using our clinic review tool, if applicable. No additional management support is needed unless otherwise documented below in the visit note. 

## 2015-05-21 NOTE — Progress Notes (Signed)
Patient presents to clinic today c/o pruritic burning rash in bilateral upper and lower extremities, as well as his torso, starting last night. Endorses recently visiting his parents. Denies new soap, lotion, hygiene products or detergents. Denies exposure to pets. Denies abnormal food and water source. Has recently started taking naproxen for aches and pains. Denies other new medications. Has not taken anything for symptoms.  Past Medical History  Diagnosis Date  . Allergic rhinitis   . Hyperlipidemia   . Hypertension   . History of carpal tunnel syndrome   . Ankle fracture 1998  . OA (osteoarthritis) 02/06/2013    knees  . Hammer toe of right foot 05/06/2013  . GERD (gastroesophageal reflux disease)   . Atrial flutter     a. s/p CTI ablation by Dr Rayann Heman 11/2014  . Insect bite 04/23/2015    Current Outpatient Prescriptions on File Prior to Visit  Medication Sig Dispense Refill  . aspirin 81 MG tablet Take 81 mg by mouth daily.    . metoprolol succinate (TOPROL-XL) 25 MG 24 hr tablet Take 0.5 tablets (12.5 mg total) by mouth daily.    . Multiple Vitamin (MULTIVITAMIN) tablet Take 1 tablet by mouth daily.      Marland Kitchen omeprazole (PRILOSEC) 20 MG capsule TAKE 1 CAPSULE BY MOUTH EVERY DAY 15 MINUTES BEFORE MORNING MEAL AS NEEDED (Patient taking differently: TAKE 1 CAPSULE BY MOUTH EVERY DAY 15 MINUTES BEFORE MORNING MEAL AS NEEDED FOR HEARTBURN OR ACID REFLUX) 90 capsule 1  . quinapril-hydrochlorothiazide (ACCURETIC) 20-12.5 MG per tablet TAKE ONE TABLET BY MOUTH ONCE DAILY 90 tablet 1  . sildenafil (REVATIO) 20 MG tablet Please take 1-4 tablets by mouth as needed for ED 50 tablet 1  . simvastatin (ZOCOR) 20 MG tablet TAKE ONE TABLET BY MOUTH AT BEDTIME 90 tablet 0   No current facility-administered medications on file prior to visit.    Allergies  Allergen Reactions  . Penicillins     REACTION: swelling , irriation    Family History  Problem Relation Age of Onset  . Hyperlipidemia     . Hypertension    . Coronary artery disease Father     aortic valve disease  . Heart attack Father 76  . COPD Father   . Hypertension Father   . Hyperlipidemia Father   . Dementia Father   . Diabetes type II Maternal Grandfather   . Arthritis Mother   . Diabetes Sister   . Diabetes Brother     History   Social History  . Marital Status: Married    Spouse Name: N/A  . Number of Children: 1  . Years of Education: N/A   Occupational History  .      Sales   Social History Main Topics  . Smoking status: Former Research scientist (life sciences)  . Smokeless tobacco: Never Used     Comment: 5-10 pack year history  . Alcohol Use: Yes     Comment: 2-3 glasses of wine  . Drug Use: Not on file  . Sexual Activity: Yes   Other Topics Concern  . 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  All other ROS are negative.  BP 140/76 mmHg  Pulse 60  Temp(Src) 97.7 F (36.5 C) (Oral)  Ht 6' (1.829 m)  Wt 189 lb 6.4 oz (85.911 kg)  BMI 25.68 kg/m2  SpO2 99%  Physical Exam  Constitutional: He is oriented to person, place, and time and well-developed, well-nourished, and in no distress.  HENT:  Head: Normocephalic and atraumatic.  Eyes: Conjunctivae are normal.  Neck: Neck supple.  Cardiovascular: Normal rate, regular rhythm, normal heart sounds and intact distal pulses.   Pulmonary/Chest: Effort normal and breath sounds normal. No respiratory distress. He has no wheezes. He has no rales. He exhibits no tenderness.  Neurological: He is alert and oriented to person, place, and time.  Skin: Skin is warm and dry. Rash noted. Rash is maculopapular.  Vitals reviewed.   Recent Results (from the past 2160 hour(s))  TSH     Status: None   Collection Time: 04/23/15   8:48 AM  Result Value Ref Range   TSH 1.32 0.35 - 4.50 uIU/mL  CBC     Status: None   Collection Time: 04/23/15  8:48 AM  Result Value Ref Range   WBC 7.0 4.0 - 10.5 K/uL   RBC 4.89 4.22 - 5.81 Mil/uL   Platelets 220.0 150.0 - 400.0 K/uL   Hemoglobin 15.7 13.0 - 17.0 g/dL   HCT 46.1 39.0 - 52.0 %   MCV 94.3 78.0 - 100.0 fl   MCHC 34.0 30.0 - 36.0 g/dL   RDW 13.8 11.5 - 15.5 %  Comp Met (CMET)     Status: Abnormal   Collection Time: 04/23/15  8:48 AM  Result Value Ref Range   Sodium 140 135 - 145 mEq/L   Potassium 4.2 3.5 - 5.1 mEq/L   Chloride 103 96 - 112 mEq/L   CO2 28 19 - 32 mEq/L   Glucose, Bld 115 (H) 70 - 99 mg/dL   BUN 16 6 - 23 mg/dL   Creatinine, Ser 1.02 0.40 - 1.50 mg/dL   Total Bilirubin 0.6 0.2 - 1.2 mg/dL   Alkaline Phosphatase 46 39 - 117 U/L   AST 17 0 - 37 U/L   ALT 16 0 - 53 U/L   Total Protein 7.6 6.0 - 8.3 g/dL   Albumin 4.4 3.5 - 5.2 g/dL   Calcium 9.5 8.4 - 10.5 mg/dL   GFR 78.39 >60.00 mL/min  Lipid Profile     Status: None   Collection Time: 04/23/15  8:48 AM  Result Value Ref Range   Cholesterol 164 0 - 200 mg/dL    Comment: ATP III Classification       Desirable:  < 200 mg/dL               Borderline High:  200 - 239 mg/dL          High:  > = 240 mg/dL   Triglycerides 69.0 0.0 - 149.0 mg/dL    Comment: Normal:  <150 mg/dLBorderline High:  150 - 199 mg/dL   HDL 58.60 >39.00 mg/dL   VLDL 13.8 0.0 - 40.0 mg/dL   LDL Cholesterol 92 0 - 99 mg/dL   Total CHOL/HDL Ratio 3     Comment:                Men          Women1/2 Average Risk     3.4          3.3Average Risk          5.0  4.42X Average Risk          9.6          7.13X Average Risk          15.0          11.0                       NonHDL 105.40     Comment: NOTE:  Non-HDL goal should be 30 mg/dL higher than patient's LDL goal (i.e. LDL goal of < 70 mg/dL, would have non-HDL goal of < 100 mg/dL)  HgB A1c     Status: None   Collection Time: 04/23/15  8:48 AM  Result Value Ref Range    Hgb A1c MFr Bld 5.5 4.6 - 6.5 %    Comment: Glycemic Control Guidelines for People with Diabetes:Non Diabetic:  <6%Goal of Therapy: <7%Additional Action Suggested:  >8%     Assessment/Plan: Allergic dermatitis Bilateral. Upper and lower extremities involved. Suspect allergic dermatitis to either recent allergen contact or new medication. Stop Naproxen. Hygiene measures discussed. Rx Kenalog BID. Prednisone pack given. Claritin, cool compresses and Sarna lotion for itch. Follow-up if not resolving.

## 2015-05-21 NOTE — Assessment & Plan Note (Signed)
Bilateral. Upper and lower extremities involved. Suspect allergic dermatitis to either recent allergen contact or new medication. Stop Naproxen. Hygiene measures discussed. Rx Kenalog BID. Prednisone pack given. Claritin, cool compresses and Sarna lotion for itch. Follow-up if not resolving.

## 2015-05-21 NOTE — Patient Instructions (Signed)
Please take steroid taper as directed. Use the Kenalog ointment twice daily. Claritin daily with help with itch. Cool compresses and Sarna lotion may also be beneficial. Stop the Naproxen as it may contributing to symptoms.  Follow-up if not resolving.

## 2015-05-25 ENCOUNTER — Telehealth: Payer: Self-pay

## 2015-05-25 ENCOUNTER — Encounter: Payer: Self-pay | Admitting: Family Medicine

## 2015-05-25 DIAGNOSIS — I4892 Unspecified atrial flutter: Secondary | ICD-10-CM

## 2015-05-25 NOTE — Telephone Encounter (Signed)
Pt notified verbalized of results with no questions or concerns at this time..  Please check in with this patient and confirm that he is still only taking 1/2 of his Metoprolol. If that is the case the first thing we should do is have him increase to a full tab of the Metoprolol XL 25 mg and then come in for a BP check in 3-4 weeks so we can make further decisions. Disp #30 with 2rf or #90 with no RFs. THX

## 2015-06-05 ENCOUNTER — Ambulatory Visit (INDEPENDENT_AMBULATORY_CARE_PROVIDER_SITE_OTHER): Payer: 59 | Admitting: Internal Medicine

## 2015-06-05 ENCOUNTER — Encounter: Payer: Self-pay | Admitting: Internal Medicine

## 2015-06-05 VITALS — BP 124/66 | HR 59 | Temp 98.0°F | Ht 72.0 in | Wt 184.5 lb

## 2015-06-05 DIAGNOSIS — S4992XA Unspecified injury of left shoulder and upper arm, initial encounter: Secondary | ICD-10-CM

## 2015-06-05 NOTE — Progress Notes (Signed)
Pre visit review using our clinic review tool, if applicable. No additional management support is needed unless otherwise documented below in the visit note. 

## 2015-06-05 NOTE — Progress Notes (Signed)
Subjective:    Patient ID: William Phillips, male    DOB: 12-23-1950, 64 y.o.   MRN: [id number]  DOS:  06/05/2015 Type of visit - description : Acute visit Interval history:  Yesterday, was trying to lift 55 gallons of liquid with his arms and suddenly felt a intense pain of the left biceps. He denies a "pop" at the area, today still has pain mostly when he moves his arm, no pain at rest.   Has a history of remote right bicipital injury with a deformity, now he sees the same shape at the L biceps  Review of Systems No bruising, swelling. No shoulder pain per se.  Past Medical History  Diagnosis Date  . Allergic rhinitis   . Hyperlipidemia   . Hypertension   . History of carpal tunnel syndrome   . Ankle fracture 1998  . OA (osteoarthritis) 02/06/2013    knees  . Hammer toe of right foot 05/06/2013  . GERD (gastroesophageal reflux disease)   . Atrial flutter     a. s/p CTI ablation by Dr Rayann Heman 11/2014  . Insect bite 04/23/2015    Past Surgical History  Procedure Laterality Date  . Inguinal hernia repair  2008  . Colonoscopy  08/04/2002    Normal Exam- Dr Henrene Pastor  . Carpel tunnel    . Ankle surgery    . Tonsillectomy    . Ablation  11-24-14    CTI ablation by Dr Rayann Heman  . Atrial flutter ablation N/A 11/24/2014    Procedure: ATRIAL FLUTTER ABLATION;  Surgeon: Thompson Grayer, MD;  Location: [facility] CATH LAB;  Service: Cardiovascular;  Laterality: N/A;    Social History   Social History  . Marital Status: Married    Spouse Name: N/A  . Number of Children: 1  . Years of Education: N/A   Occupational History  .      Sales   Social History Main Topics  . Smoking status: Former Research scientist (life sciences)  . Smokeless tobacco: Never Used     Comment: 5-10 pack year history  . Alcohol Use: Yes     Comment: 2-3 glasses of wine  . Drug Use: Not on file  . Sexual Activity: Yes   Other Topics Concern  . 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  Always use your most recent med list.               aspirin 81 MG tablet  Take 81 mg by mouth daily.     metoprolol succinate 25 MG 24 hr tablet  Commonly known as:  TOPROL-XL  Take 0.5 tablets (12.5 mg total) by mouth daily.     multivitamin tablet  Take 1 tablet by mouth daily.     naproxen 500 MG tablet  Commonly known as:  NAPROSYN  Take 1 tablet by mouth 2 (two) times daily as needed.     omeprazole 20 MG capsule  Commonly known as:  PRILOSEC  TAKE 1 CAPSULE BY MOUTH EVERY DAY 15 MINUTES BEFORE MORNING MEAL AS NEEDED     quinapril-hydrochlorothiazide 20-12.5  MG per tablet  Commonly known as:  ACCURETIC  TAKE ONE TABLET BY MOUTH ONCE DAILY     sildenafil 20 MG tablet  Commonly known as:  REVATIO  Please take 1-4 tablets by mouth as needed for ED     simvastatin 20 MG tablet  Commonly known as:  ZOCOR  TAKE ONE TABLET BY MOUTH AT BEDTIME     triamcinolone cream 0.1 %  Commonly known as:  KENALOG  Apply 1 application topically 2 (two) times daily.           Objective:   Physical Exam BP 124/66 mmHg  Pulse 59  Temp(Src) 98 F (36.7 C) (Oral)  Ht 6' (1.829 m)  Wt 184 lb 8 oz (83.689 kg)  BMI 25.02 kg/m2  SpO2 97% General:   Well developed, well nourished . NAD.  HEENT:  Normocephalic . Face symmetric, atraumatic Shoulders: No deformities, full range of motion. Right biceps: "popeye" deformity noted Left biceps: has a milder "popeye" deformity , biceps is a slightly TTP. No ecchymoses or hematoma Skin: Not pale. Not jaundice Neurologic:  alert & oriented X3.  Speech normal, gait appropriate for age and unassisted Psych--  Cognition and judgment appear intact.  Cooperative with normal  attention span and concentration.  Behavior appropriate. No anxious or depressed appearing.      Assessment & Plan:   Suspect bicipital injury or partial rupture. Recommend conservative treatment, see instructions, as long as the pain is not severe and the arm remains functional no indication for orthopedic referral.

## 2015-06-05 NOTE — Patient Instructions (Signed)
Rest your  arm for at least 3 or 4 weeks.  Ice twice a day x 2 days  IBUPROFEN (Ad vil or Motrin) 200 mg 2 tablets every 6 hours as needed for pain.  Always take it with food because may cause gastritis and ulcers.  If you notice nausea, stomach pain, change in the color of stools --->  Stop the medicine and let us know   Tylenol  500 mg OTC 2 tabs a day every 8 hours as needed for pain   If in the next few days you notice decreased function of your arm or the pain is not gradually improving : Please call for a orthopedic referral

## 2015-06-22 ENCOUNTER — Telehealth: Payer: Self-pay | Admitting: Family Medicine

## 2015-06-22 DIAGNOSIS — I4892 Unspecified atrial flutter: Secondary | ICD-10-CM

## 2015-06-22 MED ORDER — SIMVASTATIN 20 MG PO TABS: 20.0000 mg | ORAL_TABLET | Freq: Every day | ORAL | Status: DC

## 2015-06-22 MED ORDER — METOPROLOL SUCCINATE ER 25 MG PO TB24: 25.0000 mg | ORAL_TABLET | Freq: Every day | ORAL | Status: DC

## 2015-06-22 MED ORDER — QUINAPRIL-HYDROCHLOROTHIAZIDE 20-12.5 MG PO TABS: 1.0000 | ORAL_TABLET | Freq: Every day | ORAL | Status: DC

## 2015-06-22 NOTE — Telephone Encounter (Signed)
Called the patient sent in #30 day refills of each medication requested.  But he wanted sent to the [facility] in [place], [place] not [place], [place].  Did send to correct location.

## 2015-06-22 NOTE — Telephone Encounter (Signed)
Relation to pt: self Call back number: [phone]  Pharmacy: [facility] [phone]. [address], [place], [place] [address] :[phone]  Reason for call:  Patient went out of town and left medication requesting a 3 day supply: please send to [facility]. [address], [place], [place] [address] [phone]   quinapril-hydrochlorothiazide (ACCURETIC) 20-12.5 MG per tablet  metoprolol succinate (TOPROL-XL) 25 MG 24 hr tablet  simvastatin (ZOCOR) 20 MG tablet

## 2015-07-24 ENCOUNTER — Ambulatory Visit (INDEPENDENT_AMBULATORY_CARE_PROVIDER_SITE_OTHER): Payer: 59 | Admitting: Family Medicine

## 2015-07-24 ENCOUNTER — Encounter: Payer: Self-pay | Admitting: Family Medicine

## 2015-07-24 VITALS — BP 138/84 | HR 62 | Temp 98.1°F | Ht 72.0 in | Wt 189.5 lb

## 2015-07-24 DIAGNOSIS — Z23 Encounter for immunization: Secondary | ICD-10-CM

## 2015-07-24 DIAGNOSIS — R739 Hyperglycemia, unspecified: Secondary | ICD-10-CM

## 2015-07-24 DIAGNOSIS — I10 Essential (primary) hypertension: Secondary | ICD-10-CM | POA: Diagnosis not present

## 2015-07-24 DIAGNOSIS — E782 Mixed hyperlipidemia: Secondary | ICD-10-CM | POA: Diagnosis not present

## 2015-07-24 DIAGNOSIS — K219 Gastro-esophageal reflux disease without esophagitis: Secondary | ICD-10-CM

## 2015-07-24 DIAGNOSIS — M549 Dorsalgia, unspecified: Secondary | ICD-10-CM

## 2015-07-24 MED ORDER — QUINAPRIL-HYDROCHLOROTHIAZIDE 20-12.5 MG PO TABS: 1.0000 | ORAL_TABLET | Freq: Every day | ORAL | Status: DC

## 2015-07-24 MED ORDER — HYDROCODONE-ACETAMINOPHEN 5-325 MG PO TABS: 1.0000 | ORAL_TABLET | Freq: Four times a day (QID) | ORAL | Status: DC | PRN

## 2015-07-24 MED ORDER — METOPROLOL SUCCINATE ER 50 MG PO TB24: 50.0000 mg | ORAL_TABLET | Freq: Every day | ORAL | Status: DC

## 2015-07-24 MED ORDER — CYCLOBENZAPRINE HCL 10 MG PO TABS: 10.0000 mg | ORAL_TABLET | Freq: Every evening | ORAL | Status: DC | PRN

## 2015-07-24 MED ORDER — OMEPRAZOLE 20 MG PO CPDR: DELAYED_RELEASE_CAPSULE | ORAL | Status: DC

## 2015-07-24 MED ORDER — SIMVASTATIN 20 MG PO TABS: 20.0000 mg | ORAL_TABLET | Freq: Every day | ORAL | Status: DC

## 2015-07-24 MED ORDER — METHYLPREDNISOLONE 4 MG PO TABS: ORAL_TABLET | ORAL | Status: DC

## 2015-07-24 NOTE — Patient Instructions (Signed)
Try Salon Pas patches or Aspercreme with Lidocaine patches   Back Pain, Adult Back pain is very common in adults.The cause of back pain is rarely dangerous and the pain often gets better over time.The cause of your back pain may not be known. Some common causes of back pain include:  Strain of the muscles or ligaments supporting the spine.  Wear and tear (degeneration) of the spinal disks.  Arthritis.  Direct injury to the back. For many people, back pain may return. Since back pain is rarely dangerous, most people can learn to manage this condition on their own. HOME CARE INSTRUCTIONS Watch your back pain for any changes. The following actions may help to lessen any discomfort you are feeling:  Remain active. It is stressful on your back to sit or stand in one place for long periods of time. Do not sit, drive, or stand in one place for more than 30 minutes at a time. Take short walks on even surfaces as soon as you are able.Try to increase the length of time you walk each day.  Exercise regularly as directed by your health care provider. Exercise helps your back heal faster. It also helps avoid future injury by keeping your muscles strong and flexible.  Do not stay in bed.Resting more than 1-2 days can delay your recovery.  Pay attention to your body when you bend and lift. The most comfortable positions are those that put less stress on your recovering back. Always use proper lifting techniques, including:  Bending your knees.  Keeping the load close to your body.  Avoiding twisting.  Find a comfortable position to sleep. Use a firm mattress and lie on your side with your knees slightly bent. If you lie on your back, put a pillow under your knees.  Avoid feeling anxious or stressed.Stress increases muscle tension and can worsen back pain.It is important to recognize when you are anxious or stressed and learn ways to manage it, such as with exercise.  Take medicines only as  directed by your health care provider. Over-the-counter medicines to reduce pain and inflammation are often the most helpful.Your health care provider may prescribe muscle relaxant drugs.These medicines help dull your pain so you can more quickly return to your normal activities and healthy exercise.  Apply ice to the injured area:  Put ice in a plastic bag.  Place a towel between your skin and the bag.  Leave the ice on for 20 minutes, 2-3 times a day for the first 2-3 days. After that, ice and heat may be alternated to reduce pain and spasms.  Maintain a healthy weight. Excess weight puts extra stress on your back and makes it difficult to maintain good posture. SEEK MEDICAL CARE IF:  You have pain that is not relieved with rest or medicine.  You have increasing pain going down into the legs or buttocks.  You have pain that does not improve in one week.  You have night pain.  You lose weight.  You have a fever or chills. SEEK IMMEDIATE MEDICAL CARE IF:   You develop new bowel or bladder control problems.  You have unusual weakness or numbness in your arms or legs.  You develop nausea or vomiting.  You develop abdominal pain.  You feel faint.   This information is not intended to replace advice given to you by your health care provider. Make sure you discuss any questions you have with your health care provider.   Document Released: 09/29/2005 Document  Revised: 10/20/2014 Document Reviewed: 01/31/2014 Elsevier Interactive Patient Education [employer].

## 2015-07-24 NOTE — Progress Notes (Signed)
Pre visit review using our clinic review tool, if applicable. No additional management support is needed unless otherwise documented below in the visit note. 

## 2015-08-05 ENCOUNTER — Encounter: Payer: Self-pay | Admitting: Family Medicine

## 2015-08-05 DIAGNOSIS — M549 Dorsalgia, unspecified: Secondary | ICD-10-CM | POA: Insufficient documentation

## 2015-08-05 HISTORY — DX: Dorsalgia, unspecified: M54.9

## 2015-08-05 NOTE — Assessment & Plan Note (Signed)
hgba1c acceptable, minimize simple carbs. Increase exercise as tolerated.  

## 2015-08-05 NOTE — Assessment & Plan Note (Addendum)
Encouraged moist heat and gentle stretching as tolerated. May try NSAIDs and prescription meds as directed and report if symptoms worsen or seek immediate care. Majority of pain to right, given medrol dose pack and hydrocodone to use short term. Report if worsens or not improved

## 2015-08-05 NOTE — Assessment & Plan Note (Signed)
Mild elevation. no changes to meds. Encouraged heart healthy diet such as the DASH diet and exercise as tolerated.

## 2015-08-05 NOTE — Assessment & Plan Note (Signed)
Avoid offending foods, start probiotics. Do not eat large meals in late evening and consider raising head of bed. Given refill on Omeprazole.

## 2015-08-05 NOTE — Assessment & Plan Note (Signed)
Tolerating statin, encouraged heart healthy diet, avoid trans fats, minimize simple carbs and saturated fats. Increase exercise as tolerated 

## 2015-08-05 NOTE — Progress Notes (Signed)
Subjective:    Patient ID: William Phillips, male    DOB: 14-Jan-1951, 64 y.o.   MRN: [id number]  Chief Complaint  Patient presents with  . Back Pain    HPI Patient is in today for follow-up and complaints of back pain. His low back and his posterior right hip pain. No falls or trauma. Has had trouble off and on with low back pain but has been worse since a recent golfing trip. No numbness tingling or weakness down the leg. No incontinence. Has trouble finding a comfortable position. Continues to struggle with heartburn but notes pretty good control with omeprazole. No recent illness. Denies CP/palp/SOB/HA/congestion/fevers/GI or GU c/o. Taking meds as prescribed  Past Medical History  Diagnosis Date  . Allergic rhinitis   . Hyperlipidemia   . Hypertension   . History of carpal tunnel syndrome   . Ankle fracture 1998  . OA (osteoarthritis) 02/06/2013    knees  . Hammer toe of right foot 05/06/2013  . GERD (gastroesophageal reflux disease)   . Atrial flutter ([place])     a. s/p CTI ablation by Dr Rayann Heman 11/2014  . Insect bite 04/23/2015  . Back pain 08/05/2015    Past Surgical History  Procedure Laterality Date  . Inguinal hernia repair  2008  . Colonoscopy  08/04/2002    Normal Exam- Dr Henrene Pastor  . Carpel tunnel    . Ankle surgery    . Tonsillectomy    . Ablation  11-24-14    CTI ablation by Dr Rayann Heman  . Atrial flutter ablation N/A 11/24/2014    Procedure: ATRIAL FLUTTER ABLATION;  Surgeon: Thompson Grayer, MD;  Location: [facility] CATH LAB;  Service: Cardiovascular;  Laterality: N/A;    Family History  Problem Relation Age of Onset  . Hyperlipidemia    . Hypertension    . Coronary artery disease Father     aortic valve disease  . Heart attack Father 68  . COPD Father   . Hypertension Father   . Hyperlipidemia Father   . Dementia Father   . Diabetes type II Maternal Grandfather   . Arthritis Mother   . Diabetes Sister   . Diabetes Brother     Social History   Social History    . Marital Status: Married    Spouse Name: N/A  . Number of Children: 1  . Years of Education: N/A   Occupational History  .      Sales   Social History Main Topics  . Smoking status: Former Research scientist (life sciences)  . Smokeless tobacco: Never Used     Comment: 5-10 pack year history  . Alcohol Use: Yes     Comment: 2-3 glasses of wine  . Drug Use: Not on file  . Sexual Activity: Yes   Other Topics Concern  . 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 aspirin 81 MG tablet Take 81 mg by mouth daily.    . Multiple Vitamin (MULTIVITAMIN) tablet Take 1 tablet by mouth daily.      . sildenafil (REVATIO) 20 MG tablet Please take 1-4 tablets by mouth as needed for ED 50 tablet 1  .  metoprolol succinate (TOPROL-XL) 25 MG 24 hr tablet Take 1 tablet (25 mg total) by mouth daily. 30 tablet 0  . naproxen (NAPROSYN) 500 MG tablet Take 1 tablet by mouth 2 (two) times daily as needed.  1  . omeprazole (PRILOSEC) 20 MG capsule TAKE 1 CAPSULE BY MOUTH EVERY DAY 15 MINUTES BEFORE MORNING MEAL AS NEEDED (Patient taking differently: TAKE 1 CAPSULE BY MOUTH EVERY DAY 15 MINUTES BEFORE MORNING MEAL AS NEEDED FOR HEARTBURN OR ACID REFLUX) 90 capsule 1  . quinapril-hydrochlorothiazide (ACCURETIC) 20-12.5 MG per tablet Take 1 tablet by mouth daily. 30 tablet 0  . simvastatin (ZOCOR) 20 MG tablet Take 1 tablet (20 mg total) by mouth at bedtime. 30 tablet 0  . triamcinolone cream (KENALOG) 0.1 % Apply 1 application topically 2 (two) times daily. 30 g 0   No facility-administered medications prior to visit.    Allergies  Allergen Reactions  . Penicillins     REACTION: swelling , irriation    Review of Systems   Constitutional: Negative for fever and malaise/fatigue.  HENT: Negative for congestion.   Eyes: Negative for discharge.  Respiratory: Negative for shortness of breath.   Cardiovascular: Negative for chest pain, palpitations and leg swelling.  Gastrointestinal: Positive for constipation. Negative for nausea and abdominal pain.  Genitourinary: Negative for dysuria.  Musculoskeletal: Positive for back pain. Negative for falls.  Skin: Negative for rash.  Neurological: Negative for loss of consciousness and headaches.  Endo/Heme/Allergies: Negative for environmental allergies.  Psychiatric/Behavioral: Negative for depression. The patient is not nervous/anxious.        Objective:    Physical Exam  Constitutional: He is oriented to person, place, and time. He appears well-developed and well-nourished. No distress.  HENT:  Head: Normocephalic and atraumatic.  Nose: Nose normal.  Mouth/Throat: Oropharyngeal exudate present.  Eyes: Right eye exhibits no discharge. Left eye exhibits no discharge.  Neck: Normal range of motion. Neck supple.  Cardiovascular: Normal rate and regular rhythm.   No murmur heard. Pulmonary/Chest: Effort normal and breath sounds normal.  Abdominal: Soft. Bowel sounds are normal. There is no tenderness.  Musculoskeletal: He exhibits no edema.  Neurological: He is alert and oriented to person, place, and time.  Skin: Skin is warm and dry.  Psychiatric: He has a normal mood and affect.  Nursing note and vitals reviewed.   BP 138/84 mmHg  Pulse 62  Temp(Src) 98.1 F (36.7 C) (Oral)  Ht 6' (1.829 m)  Wt 189 lb 8 oz (85.957 kg)  BMI 25.70 kg/m2  SpO2 96% Wt Readings from Last 3 Encounters:  07/24/15 189 lb 8 oz (85.957 kg)  06/05/15 184 lb 8 oz (83.689 kg)  05/21/15 189 lb 6.4 oz (85.911 kg)     Lab Results  Component Value Date   WBC 7.0 04/23/2015   HGB 15.7 04/23/2015   HCT 46.1 04/23/2015   PLT 220.0 04/23/2015   GLUCOSE 115* 04/23/2015   CHOL  164 04/23/2015   TRIG 69.0 04/23/2015   HDL 58.60 04/23/2015   LDLCALC 92 04/23/2015   ALT 16 04/23/2015   AST 17 04/23/2015   NA 140 04/23/2015   K 4.2 04/23/2015   CL 103 04/23/2015   CREATININE 1.02 04/23/2015   BUN 16 04/23/2015   CO2 28 04/23/2015   TSH 1.32 04/23/2015   PSA 1.59 04/28/2013   HGBA1C 5.5 04/23/2015    Lab Results  Component Value Date   TSH 1.32 04/23/2015   Lab Results  Component Value Date  WBC 7.0 04/23/2015   HGB 15.7 04/23/2015   HCT 46.1 04/23/2015   MCV 94.3 04/23/2015   PLT 220.0 04/23/2015   Lab Results  Component Value Date   NA 140 04/23/2015   K 4.2 04/23/2015   CO2 28 04/23/2015   GLUCOSE 115* 04/23/2015   BUN 16 04/23/2015   CREATININE 1.02 04/23/2015   BILITOT 0.6 04/23/2015   ALKPHOS 46 04/23/2015   AST 17 04/23/2015   ALT 16 04/23/2015   PROT 7.6 04/23/2015   ALBUMIN 4.4 04/23/2015   CALCIUM 9.5 04/23/2015   ANIONGAP 15 09/25/2014   GFR 78.39 04/23/2015   Lab Results  Component Value Date   CHOL 164 04/23/2015   Lab Results  Component Value Date   HDL 58.60 04/23/2015   Lab Results  Component Value Date   LDLCALC 92 04/23/2015   Lab Results  Component Value Date   TRIG 69.0 04/23/2015   Lab Results  Component Value Date   CHOLHDL 3 04/23/2015   Lab Results  Component Value Date   HGBA1C 5.5 04/23/2015       Assessment & Plan:   Problem List Items Addressed This Visit    Hyperlipidemia, mixed    Tolerating statin, encouraged heart healthy diet, avoid trans fats, minimize simple carbs and saturated fats. Increase exercise as tolerated      Relevant Medications   simvastatin (ZOCOR) 20 MG tablet   quinapril-hydrochlorothiazide (ACCURETIC) 20-12.5 MG tablet   metoprolol succinate (TOPROL-XL) 50 MG 24 hr tablet   Hyperglycemia    hgba1c acceptable, minimize simple carbs. Increase exercise as tolerated.       GERD    Avoid offending foods, start probiotics. Do not eat large meals in late evening  and consider raising head of bed. Given refill on Omeprazole.      Relevant Medications   omeprazole (PRILOSEC) 20 MG capsule   Essential hypertension    Mild elevation. no changes to meds. Encouraged heart healthy diet such as the DASH diet and exercise as tolerated.       Relevant Medications   simvastatin (ZOCOR) 20 MG tablet   quinapril-hydrochlorothiazide (ACCURETIC) 20-12.5 MG tablet   metoprolol succinate (TOPROL-XL) 50 MG 24 hr tablet   Back pain    Encouraged moist heat and gentle stretching as tolerated. May try NSAIDs and prescription meds as directed and report if symptoms worsen or seek immediate care. Majority of pain to right, given medrol dose pack and hydrocodone to use short term. Report if worsens or not improved      Relevant Medications   methylPREDNISolone (MEDROL) 4 MG tablet   cyclobenzaprine (FLEXERIL) 10 MG tablet   HYDROcodone-acetaminophen (NORCO/VICODIN) 5-325 MG tablet    Other Visit Diagnoses    Encounter for immunization    -  Primary       I have discontinued Mr. Pennisi naproxen and triamcinolone cream. I have also changed his omeprazole and quinapril-hydrochlorothiazide. Additionally, I am having him start on methylPREDNISolone, cyclobenzaprine, and HYDROcodone-acetaminophen. Lastly, I am having him maintain his multivitamin, aspirin, sildenafil, simvastatin, and metoprolol succinate.  Meds ordered this encounter  Medications  . simvastatin (ZOCOR) 20 MG tablet    Sig: Take 1 tablet (20 mg total) by mouth at bedtime.    Dispense:  90 tablet    Refill:  2  . methylPREDNISolone (MEDROL) 4 MG tablet    Sig: 6 tabs po once then 5 tabs po x 1 d then 4 tabs po x 1d then 3 tabs po  x 1 d then 2 tabs po x 1 day then 1 tab po x 1 day    Dispense:  21 tablet    Refill:  0  . DISCONTD: metoprolol succinate (TOPROL-XL) 50 MG 24 hr tablet    Sig: Take 1 tablet (50 mg total) by mouth daily. Take with or immediately following a meal.    Dispense:  90  tablet    Refill:  3  . cyclobenzaprine (FLEXERIL) 10 MG tablet    Sig: Take 1 tablet (10 mg total) by mouth at bedtime as needed for muscle spasms.    Dispense:  30 tablet    Refill:  0  . HYDROcodone-acetaminophen (NORCO/VICODIN) 5-325 MG tablet    Sig: Take 1 tablet by mouth every 6 (six) hours as needed for moderate pain.    Dispense:  30 tablet    Refill:  0  . omeprazole (PRILOSEC) 20 MG capsule    Sig: TAKE 1 CAPSULE BY MOUTH EVERY DAY 15 MINUTES BEFORE MORNING MEAL AS NEEDED FOR HEARTBURN OR ACID REFLUX    Dispense:  90 capsule    Refill:  1  . quinapril-hydrochlorothiazide (ACCURETIC) 20-12.5 MG tablet    Sig: Take 1 tablet by mouth daily.    Dispense:  90 tablet    Refill:  3  . metoprolol succinate (TOPROL-XL) 50 MG 24 hr tablet    Sig: Take 1 tablet (50 mg total) by mouth daily. Take with or immediately following a meal.    Dispense:  90 tablet    Refill:  3     Penni Homans, MD

## 2015-08-10 ENCOUNTER — Telehealth: Payer: Self-pay | Admitting: Family Medicine

## 2015-08-10 NOTE — Telephone Encounter (Signed)
See if Feb 16 works for him can be flexible on time

## 2015-08-10 NOTE — Telephone Encounter (Signed)
Called pt to reschedule his January CPE appt. Next available CPE showing is in April. Pt says that he need something sooner than April if possible. He wants me to check with provider for scheduling.    Please Advise.

## 2015-08-10 NOTE — Telephone Encounter (Signed)
Followed up with pt to schedule. Pt sates that he need something first thing. I informed pt that Dr. B will be in a meeting all that morning. Scheduled pt on Monday Feb 13 at 8:00 per his request. Is this okay? If not I will reschedule.

## 2015-08-11 NOTE — Telephone Encounter (Signed)
Looks good

## 2015-08-13 ENCOUNTER — Ambulatory Visit (INDEPENDENT_AMBULATORY_CARE_PROVIDER_SITE_OTHER): Payer: 59 | Admitting: Family Medicine

## 2015-08-13 ENCOUNTER — Encounter: Payer: Self-pay | Admitting: Family Medicine

## 2015-08-13 VITALS — BP 138/80 | HR 64 | Temp 98.1°F | Ht 72.0 in | Wt 188.2 lb

## 2015-08-13 DIAGNOSIS — K219 Gastro-esophageal reflux disease without esophagitis: Secondary | ICD-10-CM | POA: Diagnosis not present

## 2015-08-13 DIAGNOSIS — N400 Enlarged prostate without lower urinary tract symptoms: Secondary | ICD-10-CM | POA: Diagnosis not present

## 2015-08-13 DIAGNOSIS — M549 Dorsalgia, unspecified: Secondary | ICD-10-CM

## 2015-08-13 DIAGNOSIS — I10 Essential (primary) hypertension: Secondary | ICD-10-CM | POA: Diagnosis not present

## 2015-08-13 DIAGNOSIS — N410 Acute prostatitis: Secondary | ICD-10-CM | POA: Diagnosis not present

## 2015-08-13 LAB — POCT URINALYSIS DIPSTICK
Bilirubin, UA: NEGATIVE
Blood, UA: NEGATIVE
GLUCOSE UA: NEGATIVE
KETONES UA: NEGATIVE
Nitrite, UA: NEGATIVE
Protein, UA: NEGATIVE
SPEC GRAV UA: 1.015
Urobilinogen, UA: 2
pH, UA: 6.5

## 2015-08-13 MED ORDER — TAMSULOSIN HCL 0.4 MG PO CAPS: 0.4000 mg | ORAL_CAPSULE | Freq: Every day | ORAL | Status: DC

## 2015-08-13 MED ORDER — CIPROFLOXACIN HCL 500 MG PO TABS: 500.0000 mg | ORAL_TABLET | Freq: Two times a day (BID) | ORAL | Status: DC

## 2015-08-13 NOTE — Progress Notes (Signed)
Pre visit review using our clinic review tool, if applicable. No additional management support is needed unless otherwise documented below in the visit note. 

## 2015-08-14 LAB — CULTURE, URINE COMPREHENSIVE
COLONY COUNT: NO GROWTH
ORGANISM ID, BACTERIA: NO GROWTH

## 2015-08-16 ENCOUNTER — Encounter: Payer: Self-pay | Admitting: Family Medicine

## 2015-08-24 ENCOUNTER — Ambulatory Visit (INDEPENDENT_AMBULATORY_CARE_PROVIDER_SITE_OTHER): Payer: 59 | Admitting: Family Medicine

## 2015-08-24 VITALS — BP 136/88 | HR 70

## 2015-08-24 DIAGNOSIS — Z23 Encounter for immunization: Secondary | ICD-10-CM

## 2015-08-24 DIAGNOSIS — I10 Essential (primary) hypertension: Secondary | ICD-10-CM | POA: Diagnosis not present

## 2015-08-24 NOTE — Progress Notes (Signed)
Pre visit review using our clinic review tool, if applicable. No additional management support is needed unless otherwise documented below in the visit note.  Patient in for follow up Blood Pressure Check   Patient also received Tdap vaccine.

## 2015-08-24 NOTE — Patient Instructions (Signed)
Continue medications as ordered. Monitor Sodium intake.

## 2015-08-26 ENCOUNTER — Encounter: Payer: Self-pay | Admitting: Family Medicine

## 2015-08-26 DIAGNOSIS — N400 Enlarged prostate without lower urinary tract symptoms: Secondary | ICD-10-CM

## 2015-08-26 DIAGNOSIS — N138 Other obstructive and reflux uropathy: Secondary | ICD-10-CM | POA: Insufficient documentation

## 2015-08-26 HISTORY — DX: Benign prostatic hyperplasia without lower urinary tract symptoms: N40.0

## 2015-08-26 NOTE — Assessment & Plan Note (Signed)
Well controlled, no changes to meds. Encouraged heart healthy diet such as the DASH diet and exercise as tolerated.  °

## 2015-08-26 NOTE — Assessment & Plan Note (Signed)
Avoid offending foods, start probiotics. Do not eat large meals in late evening and consider raising head of bed.  

## 2015-08-26 NOTE — Assessment & Plan Note (Addendum)
Patient with increasing difficulty with urination, worse qhs. Urine culture unremarkable. Started on Flomax if no improvement patient will need urology for further consideration.

## 2015-08-26 NOTE — Progress Notes (Signed)
Subjective:    Patient ID: William Phillips, male    DOB: 20-Apr-1951, 64 y.o.   MRN: [id number]  Chief Complaint  Patient presents with  . Follow-up    urinating problem    HPI Patient is in today for evaluation of urinary hesitancy. He is noting increasing difficulty urinating at night. Is able to urinate 3-4 times during the day but there is always a sense of urgency and difficulty starting his stream. Describes dribbling. No dysuria or hematuria. No fevers or chills. Has some persistent back pain but it is not worsening. Has noted some recent right hip pain but denies any injury or radicular symptoms. Denies CP/palp/SOB/HA/congestion/fevers/GI c/o. Taking meds as prescribed  Past Medical History  Diagnosis Date  . Allergic rhinitis   . Hyperlipidemia   . Hypertension   . History of carpal tunnel syndrome   . Ankle fracture 1998  . OA (osteoarthritis) 02/06/2013    knees  . Hammer toe of right foot 05/06/2013  . GERD (gastroesophageal reflux disease)   . Atrial flutter ([place])     a. s/p CTI ablation by Dr Rayann Heman 11/2014  . Insect bite 04/23/2015  . Back pain 08/05/2015  . BPH (benign prostatic hyperplasia) 08/26/2015    Past Surgical History  Procedure Laterality Date  . Inguinal hernia repair  2008  . Colonoscopy  08/04/2002    Normal Exam- Dr Henrene Pastor  . Carpel tunnel    . Ankle surgery    . Tonsillectomy    . Ablation  11-24-14    CTI ablation by Dr Rayann Heman  . Atrial flutter ablation N/A 11/24/2014    Procedure: ATRIAL FLUTTER ABLATION;  Surgeon: Thompson Grayer, MD;  Location: [facility] CATH LAB;  Service: Cardiovascular;  Laterality: N/A;    Family History  Problem Relation Age of Onset  . Hyperlipidemia    . Hypertension    . Coronary artery disease Father     aortic valve disease  . Heart attack Father 71  . COPD Father   . Hypertension Father   . Hyperlipidemia Father   . Dementia Father   . Diabetes type II Maternal Grandfather   . Arthritis Mother   . Diabetes  Sister   . Diabetes Brother     Social History   Social History  . Marital Status: Married    Spouse Name: N/A  . Number of Children: 1  . Years of Education: N/A   Occupational History  .      Sales   Social History Main Topics  . Smoking status: Former Research scientist (life sciences)  . Smokeless tobacco: Never Used     Comment: 5-10 pack year history  . Alcohol Use: Yes     Comment: 2-3 glasses of wine  . Drug Use: Not on file  . Sexual Activity: Yes   Other Topics Concern  . 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 aspirin 81 MG tablet Take 81 mg by mouth daily.    . cyclobenzaprine (FLEXERIL) 10 MG tablet Take 1 tablet (10 mg total) by mouth at bedtime as needed for muscle spasms. 30 tablet 0  . HYDROcodone-acetaminophen (NORCO/VICODIN) 5-325  MG tablet Take 1 tablet by mouth every 6 (six) hours as needed for moderate pain. 30 tablet 0  . methylPREDNISolone (MEDROL) 4 MG tablet 6 tabs po once then 5 tabs po x 1 d then 4 tabs po x 1d then 3 tabs po x 1 d then 2 tabs po x 1 day then 1 tab po x 1 day 21 tablet 0  . metoprolol succinate (TOPROL-XL) 50 MG 24 hr tablet Take 1 tablet (50 mg total) by mouth daily. Take with or immediately following a meal. 90 tablet 3  . Multiple Vitamin (MULTIVITAMIN) tablet Take 1 tablet by mouth daily.      Marland Kitchen omeprazole (PRILOSEC) 20 MG capsule TAKE 1 CAPSULE BY MOUTH EVERY DAY 15 MINUTES BEFORE MORNING MEAL AS NEEDED FOR HEARTBURN OR ACID REFLUX 90 capsule 1  . quinapril-hydrochlorothiazide (ACCURETIC) 20-12.5 MG tablet Take 1 tablet by mouth daily. 90 tablet 3  . sildenafil (REVATIO) 20 MG tablet Please take 1-4 tablets by mouth as needed for ED 50 tablet 1  .  simvastatin (ZOCOR) 20 MG tablet Take 1 tablet (20 mg total) by mouth at bedtime. 90 tablet 2   No facility-administered medications prior to visit.    Allergies  Allergen Reactions  . Penicillins     REACTION: swelling , irriation    Review of Systems  Constitutional: Positive for malaise/fatigue. Negative for fever.  HENT: Negative for congestion.   Eyes: Negative for discharge.  Respiratory: Negative for shortness of breath.   Cardiovascular: Negative for chest pain, palpitations and leg swelling.  Gastrointestinal: Negative for nausea and abdominal pain.  Genitourinary: Negative for dysuria and flank pain.  Musculoskeletal: Positive for back pain and joint pain. Negative for falls.  Skin: Negative for rash.  Neurological: Negative for loss of consciousness and headaches.  Endo/Heme/Allergies: Negative for environmental allergies.  Psychiatric/Behavioral: Negative for depression. The patient is not nervous/anxious.        Objective:    Physical Exam  Constitutional: He is oriented to person, place, and time. He appears well-developed and well-nourished. No distress.  HENT:  Head: Normocephalic and atraumatic.  Nose: Nose normal.  Eyes: Right eye exhibits no discharge. Left eye exhibits no discharge.  Neck: Normal range of motion. Neck supple.  Cardiovascular: Normal rate and regular rhythm.   No murmur heard. Pulmonary/Chest: Effort normal and breath sounds normal.  Abdominal: Soft. Bowel sounds are normal. There is no tenderness.  Musculoskeletal: He exhibits no edema.  Neurological: He is alert and oriented to person, place, and time.  Skin: Skin is warm and dry.  Psychiatric: He has a normal mood and affect.  Nursing note and vitals reviewed.   BP 138/80 mmHg  Pulse 64  Temp(Src) 98.1 F (36.7 C) (Oral)  Ht 6' (1.829 m)  Wt 188 lb 4 oz (85.39 kg)  BMI 25.53 kg/m2  SpO2 97% Wt Readings from Last 3 Encounters:  08/13/15 188 lb 4 oz (85.39 kg)  07/24/15 189  lb 8 oz (85.957 kg)  06/05/15 184 lb 8 oz (83.689 kg)     Lab Results  Component Value Date   WBC 7.0 04/23/2015   HGB 15.7 04/23/2015   HCT 46.1 04/23/2015   PLT 220.0 04/23/2015   GLUCOSE 115* 04/23/2015   CHOL 164 04/23/2015   TRIG 69.0 04/23/2015   HDL 58.60 04/23/2015   LDLCALC 92 04/23/2015   ALT 16 04/23/2015   AST 17 04/23/2015   NA 140 04/23/2015   K 4.2 04/23/2015   CL  103 04/23/2015   CREATININE 1.02 04/23/2015   BUN 16 04/23/2015   CO2 28 04/23/2015   TSH 1.32 04/23/2015   PSA 1.59 04/28/2013   HGBA1C 5.5 04/23/2015    Lab Results  Component Value Date   TSH 1.32 04/23/2015   Lab Results  Component Value Date   WBC 7.0 04/23/2015   HGB 15.7 04/23/2015   HCT 46.1 04/23/2015   MCV 94.3 04/23/2015   PLT 220.0 04/23/2015   Lab Results  Component Value Date   NA 140 04/23/2015   K 4.2 04/23/2015   CO2 28 04/23/2015   GLUCOSE 115* 04/23/2015   BUN 16 04/23/2015   CREATININE 1.02 04/23/2015   BILITOT 0.6 04/23/2015   ALKPHOS 46 04/23/2015   AST 17 04/23/2015   ALT 16 04/23/2015   PROT 7.6 04/23/2015   ALBUMIN 4.4 04/23/2015   CALCIUM 9.5 04/23/2015   ANIONGAP 15 09/25/2014   GFR 78.39 04/23/2015   Lab Results  Component Value Date   CHOL 164 04/23/2015   Lab Results  Component Value Date   HDL 58.60 04/23/2015   Lab Results  Component Value Date   LDLCALC 92 04/23/2015   Lab Results  Component Value Date   TRIG 69.0 04/23/2015   Lab Results  Component Value Date   CHOLHDL 3 04/23/2015   Lab Results  Component Value Date   HGBA1C 5.5 04/23/2015       Assessment & Plan:   Problem List Items Addressed This Visit    GERD    Avoid offending foods, start probiotics. Do not eat large meals in late evening and consider raising head of bed.       Essential hypertension    Well controlled, no changes to meds. Encouraged heart healthy diet such as the DASH diet and exercise as tolerated.       BPH (benign prostatic hyperplasia)     Patient with increasing difficulty with urination, worse qhs. Urine culture unremarkable. Started on Flomax if no improvement patient will need urology for further consideration.      Relevant Medications   tamsulosin (FLOMAX) 0.4 MG CAPS capsule   Back pain    Encouraged moist heat and gentle stretching as tolerated. May try NSAIDs and prescription meds as directed and report if symptoms worsen or seek immediate care. Improved somewhat at this time. Does note some right hip pain as well       Other Visit Diagnoses    Acute prostatitis    -  Primary    Relevant Medications    tamsulosin (FLOMAX) 0.4 MG CAPS capsule    Other Relevant Orders    POCT Urinalysis Dipstick (Completed)    CULTURE, URINE COMPREHENSIVE (Completed)       I am having Mr. Blinn start on tamsulosin. I am also having him maintain his multivitamin, aspirin, sildenafil, simvastatin, methylPREDNISolone, cyclobenzaprine, HYDROcodone-acetaminophen, omeprazole, quinapril-hydrochlorothiazide, and metoprolol succinate.  Meds ordered this encounter  Medications  . tamsulosin (FLOMAX) 0.4 MG CAPS capsule    Sig: Take 1 capsule (0.4 mg total) by mouth daily.    Dispense:  30 capsule    Refill:  3  . DISCONTD: ciprofloxacin (CIPRO) 500 MG tablet    Sig: Take 1 tablet (500 mg total) by mouth 2 (two) times daily.    Dispense:  42 tablet    Refill:  0     Penni Homans, MD

## 2015-08-26 NOTE — Assessment & Plan Note (Addendum)
Encouraged moist heat and gentle stretching as tolerated. May try NSAIDs and prescription meds as directed and report if symptoms worsen or seek immediate care. Improved somewhat at this time. Does note some right hip pain as well

## 2015-10-19 ENCOUNTER — Other Ambulatory Visit (INDEPENDENT_AMBULATORY_CARE_PROVIDER_SITE_OTHER): Payer: 59

## 2015-10-19 DIAGNOSIS — I10 Essential (primary) hypertension: Secondary | ICD-10-CM

## 2015-10-19 DIAGNOSIS — R739 Hyperglycemia, unspecified: Secondary | ICD-10-CM | POA: Diagnosis not present

## 2015-10-19 DIAGNOSIS — R7989 Other specified abnormal findings of blood chemistry: Secondary | ICD-10-CM | POA: Diagnosis not present

## 2015-10-19 DIAGNOSIS — E782 Mixed hyperlipidemia: Secondary | ICD-10-CM

## 2015-10-19 LAB — CBC
HCT: 45 % (ref 39.0–52.0)
HEMOGLOBIN: 15.2 g/dL (ref 13.0–17.0)
MCHC: 33.8 g/dL (ref 30.0–36.0)
MCV: 94.8 fl (ref 78.0–100.0)
PLATELETS: 220 10*3/uL (ref 150.0–400.0)
RBC: 4.74 Mil/uL (ref 4.22–5.81)
RDW: 13 % (ref 11.5–15.5)
WBC: 7.2 10*3/uL (ref 4.0–10.5)

## 2015-10-19 LAB — MICROALBUMIN / CREATININE URINE RATIO
CREATININE, U: 103 mg/dL
MICROALB UR: 0.7 mg/dL (ref 0.0–1.9)
MICROALB/CREAT RATIO: 0.7 mg/g (ref 0.0–30.0)

## 2015-10-19 LAB — COMPREHENSIVE METABOLIC PANEL
ALK PHOS: 42 U/L (ref 39–117)
ALT: 17 U/L (ref 0–53)
AST: 18 U/L (ref 0–37)
Albumin: 4.4 g/dL (ref 3.5–5.2)
BILIRUBIN TOTAL: 0.7 mg/dL (ref 0.2–1.2)
BUN: 14 mg/dL (ref 6–23)
CALCIUM: 9.3 mg/dL (ref 8.4–10.5)
CO2: 28 mEq/L (ref 19–32)
Chloride: 100 mEq/L (ref 96–112)
Creatinine, Ser: 0.98 mg/dL (ref 0.40–1.50)
GFR: 81.97 mL/min (ref >60.00)
Glucose, Bld: 109 mg/dL — ABNORMAL HIGH (ref 70–99)
POTASSIUM: 3.8 meq/L (ref 3.5–5.1)
Sodium: 138 mEq/L (ref 135–145)
TOTAL PROTEIN: 7 g/dL (ref 6.0–8.3)

## 2015-10-19 LAB — LIPID PANEL
CHOLESTEROL: 190 mg/dL (ref 0–200)
HDL: 64.9 mg/dL (ref >39.00)
LDL Cholesterol: 95 mg/dL (ref 0–99)
NonHDL: 124.74
Total CHOL/HDL Ratio: 3
Triglycerides: 149 mg/dL (ref 0.0–149.0)
VLDL: 29.8 mg/dL (ref 0.0–40.0)

## 2015-10-19 LAB — TSH: TSH: 1.09 u[IU]/mL (ref 0.35–4.50)

## 2015-10-19 LAB — HEMOGLOBIN A1C: HEMOGLOBIN A1C: 5.7 % (ref 4.6–6.5)

## 2015-10-19 LAB — PSA: PSA: 1.63 ng/mL (ref 0.10–4.00)

## 2015-10-26 ENCOUNTER — Encounter: Payer: 59 | Admitting: Family Medicine

## 2015-11-26 ENCOUNTER — Encounter: Payer: Self-pay | Admitting: Family Medicine

## 2015-11-26 ENCOUNTER — Ambulatory Visit (INDEPENDENT_AMBULATORY_CARE_PROVIDER_SITE_OTHER): Payer: 59 | Admitting: Family Medicine

## 2015-11-26 VITALS — BP 138/78 | HR 71 | Temp 98.1°F | Ht 72.0 in | Wt 192.1 lb

## 2015-11-26 DIAGNOSIS — J309 Allergic rhinitis, unspecified: Secondary | ICD-10-CM

## 2015-11-26 DIAGNOSIS — Z0000 Encounter for general adult medical examination without abnormal findings: Secondary | ICD-10-CM

## 2015-11-26 DIAGNOSIS — K4090 Unilateral inguinal hernia, without obstruction or gangrene, not specified as recurrent: Secondary | ICD-10-CM

## 2015-11-26 DIAGNOSIS — I10 Essential (primary) hypertension: Secondary | ICD-10-CM

## 2015-11-26 DIAGNOSIS — E782 Mixed hyperlipidemia: Secondary | ICD-10-CM

## 2015-11-26 DIAGNOSIS — R739 Hyperglycemia, unspecified: Secondary | ICD-10-CM

## 2015-11-26 DIAGNOSIS — K219 Gastro-esophageal reflux disease without esophagitis: Secondary | ICD-10-CM

## 2015-11-26 DIAGNOSIS — I471 Supraventricular tachycardia: Secondary | ICD-10-CM

## 2015-11-26 MED ORDER — QUINAPRIL-HYDROCHLOROTHIAZIDE 20-12.5 MG PO TABS: 1.0000 | ORAL_TABLET | Freq: Every day | ORAL | Status: DC

## 2015-11-26 NOTE — Assessment & Plan Note (Signed)
Avoid offending foods, start probiotics. Do not eat large meals in late evening and consider raising head of bed.  

## 2015-11-26 NOTE — Assessment & Plan Note (Signed)
Tolerating statin, encouraged heart healthy diet, avoid trans fats, minimize simple carbs and saturated fats. Increase exercise as tolerated 

## 2015-11-26 NOTE — Assessment & Plan Note (Signed)
Well controlled, no changes to meds. Encouraged heart healthy diet such as the DASH diet and exercise as tolerated.  °

## 2015-11-26 NOTE — Progress Notes (Signed)
Patient ID: William Phillips, male   DOB: 1951-09-08, 65 y.o.   MRN: [id number]   Subjective:    Patient ID: William Phillips, male    DOB: 03/02/1951, 65 y.o.   MRN: [id number]  Chief Complaint  Patient presents with  . Annual Exam    HPI Patient is in today for annual exam. He is complaining of some mild LLE pain with some lifting over the weekend but it has resolved now. No associated back pain, urinary or bowel troubles. No fevers, chills. No other acute concerns. Stays active. Tries to maintain a heart healthy diet. Denies CP/palp/SOB/HA/congestion/fevers/GI or GU c/o. Taking meds as prescribed  Past Medical History  Diagnosis Date  . Allergic rhinitis   . Hyperlipidemia   . Hypertension   . History of carpal tunnel syndrome   . Ankle fracture 1998  . OA (osteoarthritis) 02/06/2013    knees  . Hammer toe of right foot 05/06/2013  . GERD (gastroesophageal reflux disease)   . Atrial flutter ([place])     a. s/p CTI ablation by Dr Rayann Heman 11/2014  . Insect bite 04/23/2015  . Back pain 08/05/2015  . BPH (benign prostatic hyperplasia) 08/26/2015    Past Surgical History  Procedure Laterality Date  . Inguinal hernia repair  2008  . Colonoscopy  08/04/2002    Normal Exam- Dr Henrene Pastor  . Carpel tunnel    . Ankle surgery    . Tonsillectomy    . Ablation  11-24-14    CTI ablation by Dr Rayann Heman  . Atrial flutter ablation N/A 11/24/2014    Procedure: ATRIAL FLUTTER ABLATION;  Surgeon: Thompson Grayer, MD;  Location: [facility] CATH LAB;  Service: Cardiovascular;  Laterality: N/A;    Family History  Problem Relation Age of Onset  . Hyperlipidemia    . Hypertension    . Coronary artery disease Father     aortic valve disease  . Heart attack Father 35  . COPD Father   . Hypertension Father   . Hyperlipidemia Father   . Dementia Father   . Diabetes type II Maternal Grandfather   . Arthritis Mother   . Stroke Mother   . Diabetes Sister   . Diabetes Brother     Social History   Social  History  . Marital Status: Married    Spouse Name: N/A  . Number of Children: 1  . Years of Education: N/A   Occupational History  .      Sales   Social History Main Topics  . Smoking status: Former Research scientist (life sciences)  . Smokeless tobacco: Never Used     Comment: 5-10 pack year history  . Alcohol Use: Yes     Comment: 2-3 glasses of wine  . Drug Use: Not on file  . Sexual Activity: Yes   Other Topics Concern  . 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 aspirin 81 MG tablet Take 81 mg by mouth daily.    Marland Kitchen HYDROcodone-acetaminophen (NORCO/VICODIN) 5-325 MG tablet Take 1 tablet by mouth every 6 (six) hours as needed for moderate pain. 30 tablet 0  . metoprolol succinate (TOPROL-XL) 50 MG 24 hr  tablet Take 1 tablet (50 mg total) by mouth daily. Take with or immediately following a meal. 90 tablet 3  . Multiple Vitamin (MULTIVITAMIN) tablet Take 1 tablet by mouth daily.      Marland Kitchen omeprazole (PRILOSEC) 20 MG capsule TAKE 1 CAPSULE BY MOUTH EVERY DAY 15 MINUTES BEFORE MORNING MEAL AS NEEDED FOR HEARTBURN OR ACID REFLUX 90 capsule 1  . sildenafil (REVATIO) 20 MG tablet Please take 1-4 tablets by mouth as needed for ED 50 tablet 1  . simvastatin (ZOCOR) 20 MG tablet Take 1 tablet (20 mg total) by mouth at bedtime. 90 tablet 2  . tamsulosin (FLOMAX) 0.4 MG CAPS capsule Take 1 capsule (0.4 mg total) by mouth daily. 30 capsule 3  . quinapril-hydrochlorothiazide (ACCURETIC) 20-12.5 MG tablet Take 1 tablet by mouth daily. 90 tablet 3  . cyclobenzaprine (FLEXERIL) 10 MG tablet Take 1 tablet (10 mg total) by mouth at bedtime as needed for muscle spasms. 30 tablet 0  . methylPREDNISolone (MEDROL) 4 MG tablet 6 tabs po  once then 5 tabs po x 1 d then 4 tabs po x 1d then 3 tabs po x 1 d then 2 tabs po x 1 day then 1 tab po x 1 day 21 tablet 0   No facility-administered medications prior to visit.    Allergies  Allergen Reactions  . Penicillins     REACTION: swelling , irriation    Review of Systems  Constitutional: Negative for fever, chills and malaise/fatigue.  HENT: Positive for congestion. Negative for hearing loss.   Eyes: Negative for discharge.  Respiratory: Negative for cough, sputum production and shortness of breath.   Cardiovascular: Negative for chest pain, palpitations and leg swelling.  Gastrointestinal: Positive for abdominal pain. Negative for heartburn, nausea, vomiting, diarrhea, constipation and blood in stool.  Genitourinary: Negative for dysuria, urgency, frequency and hematuria.  Musculoskeletal: Negative for myalgias, back pain and falls.  Skin: Negative for rash.  Neurological: Negative for dizziness, sensory change, loss of consciousness, weakness and headaches.  Endo/Heme/Allergies: Negative for environmental allergies. Does not bruise/bleed easily.  Psychiatric/Behavioral: Negative for depression and suicidal ideas. The patient is not nervous/anxious and does not have insomnia.        Objective:    Physical Exam  Constitutional: He is oriented to person, place, and time. He appears well-developed and well-nourished. No distress.  HENT:  Head: Normocephalic and atraumatic.  Eyes: Conjunctivae are normal.  Neck: Neck supple. No thyromegaly present.  Cardiovascular: Normal rate, regular rhythm and normal heart sounds.   No murmur heard. Pulmonary/Chest: Effort normal and breath sounds normal. No respiratory distress. He has no wheezes.  Abdominal: Soft. Bowel sounds are normal. He exhibits no mass. There is no tenderness.  Musculoskeletal: He exhibits no edema.  Lymphadenopathy:    He has no cervical adenopathy.  Neurological: He is alert and oriented to person,  place, and time.  Skin: Skin is warm and dry.  Psychiatric: He has a normal mood and affect. His behavior is normal.    BP 138/78 mmHg  Pulse 71  Temp(Src) 98.1 F (36.7 C) (Oral)  Ht 6' (1.829 m)  Wt 192 lb 2 oz (87.147 kg)  BMI 26.05 kg/m2  SpO2 96% Wt Readings from Last 3 Encounters:  11/26/15 192 lb 2 oz (87.147 kg)  08/13/15 188 lb 4 oz (85.39 kg)  07/24/15 189 lb 8 oz (85.957 kg)     Lab Results  Component Value Date   WBC 7.2 10/19/2015   HGB 15.2  10/19/2015   HCT 45.0 10/19/2015   PLT 220.0 10/19/2015   GLUCOSE 109* 10/19/2015   CHOL 190 10/19/2015   TRIG 149.0 10/19/2015   HDL 64.90 10/19/2015   LDLCALC 95 10/19/2015   ALT 17 10/19/2015   AST 18 10/19/2015   NA 138 10/19/2015   K 3.8 10/19/2015   CL 100 10/19/2015   CREATININE 0.98 10/19/2015   BUN 14 10/19/2015   CO2 28 10/19/2015   TSH 1.09 10/19/2015   PSA 1.63 10/19/2015   HGBA1C 5.7 10/19/2015   MICROALBUR 0.7 10/19/2015    Lab Results  Component Value Date   TSH 1.09 10/19/2015   Lab Results  Component Value Date   WBC 7.2 10/19/2015   HGB 15.2 10/19/2015   HCT 45.0 10/19/2015   MCV 94.8 10/19/2015   PLT 220.0 10/19/2015   Lab Results  Component Value Date   NA 138 10/19/2015   K 3.8 10/19/2015   CO2 28 10/19/2015   GLUCOSE 109* 10/19/2015   BUN 14 10/19/2015   CREATININE 0.98 10/19/2015   BILITOT 0.7 10/19/2015   ALKPHOS 42 10/19/2015   AST 18 10/19/2015   ALT 17 10/19/2015   PROT 7.0 10/19/2015   ALBUMIN 4.4 10/19/2015   CALCIUM 9.3 10/19/2015   ANIONGAP 15 09/25/2014   GFR 81.97 10/19/2015   Lab Results  Component Value Date   CHOL 190 10/19/2015   Lab Results  Component Value Date   HDL 64.90 10/19/2015   Lab Results  Component Value Date   LDLCALC 95 10/19/2015   Lab Results  Component Value Date   TRIG 149.0 10/19/2015   Lab Results  Component Value Date   CHOLHDL 3 10/19/2015   Lab Results  Component Value Date   HGBA1C 5.7 10/19/2015         Assessment & Plan:   Problem List Items Addressed This Visit    Allergic rhinitis - Primary    Antihistamines prn works well      Annual physical exam    Patient encouraged to maintain heart healthy diet, regular exercise, adequate sleep. Consider daily probiotics. Take medications as prescribed. Patient seen with and examined with student.  Agree with documentation See separate note for further documentation Colonoscopy 2011 repeat in 10 years      Essential hypertension    Well controlled, no changes to meds. Encouraged heart healthy diet such as the DASH diet and exercise as tolerated.       Relevant Medications   quinapril-hydrochlorothiazide (ACCURETIC) 20-12.5 MG tablet   Other Relevant Orders   TSH   CBC   Comprehensive metabolic panel   GERD    Avoid offending foods, start probiotics. Do not eat large meals in late evening and consider raising head of bed.       Hernia of abdominal cavity    Doing better, did have some mild symptoms yesterday, resolved today.       Hyperglycemia    hgba1c acceptable, minimize simple carbs. Increase exercise as tolerated.       Hyperlipidemia, mixed    Tolerating statin, encouraged heart healthy diet, avoid trans fats, minimize simple carbs and saturated fats. Increase exercise as tolerated      Relevant Medications   quinapril-hydrochlorothiazide (ACCURETIC) 20-12.5 MG tablet   Other Relevant Orders   Lipid panel   SVT (supraventricular tachycardia) ([facility])    RRR today      Relevant Medications   quinapril-hydrochlorothiazide (ACCURETIC) 20-12.5 MG tablet      I  have discontinued Mr. Elvidge methylPREDNISolone and cyclobenzaprine. I am also having him maintain his multivitamin, aspirin, sildenafil, simvastatin, HYDROcodone-acetaminophen, omeprazole, metoprolol succinate, tamsulosin, and quinapril-hydrochlorothiazide.  Meds ordered this encounter  Medications  . quinapril-hydrochlorothiazide (ACCURETIC) 20-12.5 MG  tablet    Sig: Take 1 tablet by mouth daily.    Dispense:  90 tablet    Refill:  3     Penni Homans, MD

## 2015-11-26 NOTE — Patient Instructions (Signed)
Encouraged increased rest and hydration, add probiotics, zinc such as [facility] for Adults, Male A healthy lifestyle and preventive care can promote health and wellness. Preventive health guidelines for men include the following key practices:  A routine yearly physical is a good way to check with your health care provider about your health and preventative screening. It is a chance to share any concerns and updates on your health and to receive a thorough exam.  Visit your dentist for a routine exam and preventative care every 6 months. Brush your teeth twice a day and floss once a day. Good oral hygiene prevents tooth decay and gum disease.  The frequency of eye exams is based on your age, health, family medical history, use of contact lenses, and other factors. Follow your health care provider's recommendations for frequency of eye exams.  Eat a healthy diet. Foods such as vegetables, fruits, whole grains, low-fat dairy products, and lean protein foods contain the nutrients you need without too many calories. Decrease your intake of foods high in solid fats, added sugars, and salt. Eat the right amount of calories for you.Get information about a proper diet from your health care provider, if necessary.  Regular physical exercise is one of the most important things you can do for your health. Most adults should get at least 150 minutes of moderate-intensity exercise (any activity that increases your heart rate and causes you to sweat) each week. In addition, most adults need muscle-strengthening exercises on 2 or more days a week.  Maintain a healthy weight. The body mass index (BMI) is a screening tool to identify possible weight problems. It provides an estimate of body fat based on height and weight. Your health care provider can find your BMI and can help you achieve or maintain a healthy weight.For adults 20 years and older:  A BMI below 18.5 is considered underweight.  A  BMI of 18.5 to 24.9 is normal.  A BMI of 25 to 29.9 is considered overweight.  A BMI of 30 and above is considered obese.  Maintain normal blood lipids and cholesterol levels by exercising and minimizing your intake of saturated fat. Eat a balanced diet with plenty of fruit and vegetables. Blood tests for lipids and cholesterol should begin at age 8 and be repeated every 5 years. If your lipid or cholesterol levels are high, you are over 50, or you are at high risk for heart disease, you may need your cholesterol levels checked more frequently.Ongoing high lipid and cholesterol levels should be treated with medicines if diet and exercise are not working.  If you smoke, find out from your health care provider how to quit. If you do not use tobacco, do not start.  Lung cancer screening is recommended for adults aged 38-80 years who are at high risk for developing lung cancer because of a history of smoking. A yearly low-dose CT scan of the lungs is recommended for people who have at least a 30-pack-year history of smoking and are a current smoker or have quit within the past 15 years. A pack year of smoking is smoking an average of 1 pack of cigarettes a day for 1 year (for example: 1 pack a day for 30 years or 2 packs a day for 15 years). Yearly screening should continue until the smoker has stopped smoking for at least 15 years. Yearly screening should be stopped for people who develop a health problem that would prevent them from having lung  cancer treatment.  If you choose to drink alcohol, do not have more than 2 drinks per day. One drink is considered to be 12 ounces (355 mL) of beer, 5 ounces (148 mL) of wine, or 1.5 ounces (44 mL) of liquor.  Avoid use of street drugs. Do not share needles with anyone. Ask for help if you need support or instructions about stopping the use of drugs.  High blood pressure causes heart disease and increases the risk of stroke. Your blood pressure should be  checked at least every 1-2 years. Ongoing high blood pressure should be treated with medicines, if weight loss and exercise are not effective.  If you are 30-75 years old, ask your health care provider if you should take aspirin to prevent heart disease.  Diabetes screening is done by taking a blood sample to check your blood glucose level after you have not eaten for a certain period of time (fasting). If you are not overweight and you do not have risk factors for diabetes, you should be screened once every 3 years starting at age 67. If you are overweight or obese and you are 34-11 years of age, you should be screened for diabetes every year as part of your cardiovascular risk assessment.  Colorectal cancer can be detected and often prevented. Most routine colorectal cancer screening begins at the age of 63 and continues through age 79. However, your health care provider may recommend screening at an earlier age if you have risk factors for colon cancer. On a yearly basis, your health care provider may provide home test kits to check for hidden blood in the stool. Use of a small camera at the end of a tube to directly examine the colon (sigmoidoscopy or colonoscopy) can detect the earliest forms of colorectal cancer. Talk to your health care provider about this at age 75, when routine screening begins. Direct exam of the colon should be repeated every 5-10 years through age 59, unless early forms of precancerous polyps or small growths are found.  People who are at an increased risk for hepatitis B should be screened for this virus. You are considered at high risk for hepatitis B if:  You were born in a country where hepatitis B occurs often. Talk with your health care provider about which countries are considered high risk.  Your parents were born in a high-risk country and you have not received a shot to protect against hepatitis B (hepatitis B vaccine).  You have HIV or AIDS.  You use needles to  inject street drugs.  You live with, or have sex with, someone who has hepatitis B.  You are a man who has sex with other men (MSM).  You get hemodialysis treatment.  You take certain medicines for conditions such as cancer, organ transplantation, and autoimmune conditions.  Hepatitis C blood testing is recommended for all people born from [birth year] through 1965 and any individual with known risks for hepatitis C.  Practice safe sex. Use condoms and avoid high-risk sexual practices to reduce the spread of sexually transmitted infections (STIs). STIs include gonorrhea, chlamydia, syphilis, trichomonas, herpes, HPV, and human immunodeficiency virus (HIV). Herpes, HIV, and HPV are viral illnesses that have no cure. They can result in disability, cancer, and death.  If you are a man who has sex with other men, you should be screened at least once per year for:  HIV.  Urethral, rectal, and pharyngeal infection of gonorrhea, chlamydia, or both.  If you are at  risk of being infected with HIV, it is recommended that you take a prescription medicine daily to prevent HIV infection. This is called preexposure prophylaxis (PrEP). You are considered at risk if:  You are a man who has sex with other men (MSM) and have other risk factors.  You are a heterosexual man, are sexually active, and are at increased risk for HIV infection.  You take drugs by injection.  You are sexually active with a partner who has HIV.  Talk with your health care provider about whether you are at high risk of being infected with HIV. If you choose to begin PrEP, you should first be tested for HIV. You should then be tested every 3 months for as long as you are taking PrEP.  A one-time screening for abdominal aortic aneurysm (AAA) and surgical repair of large AAAs by ultrasound are recommended for men ages 69 to 40 years who are current or former smokers.  Healthy men should no longer receive prostate-specific antigen (PSA)  blood tests as part of routine cancer screening. Talk with your health care provider about prostate cancer screening.  Testicular cancer screening is not recommended for adult males who have no symptoms. Screening includes self-exam, a health care provider exam, and other screening tests. Consult with your health care provider about any symptoms you have or any concerns you have about testicular cancer.  Use sunscreen. Apply sunscreen liberally and repeatedly throughout the day. You should seek shade when your shadow is shorter than you. Protect yourself by wearing long sleeves, pants, a wide-brimmed hat, and sunglasses year round, whenever you are outdoors.  Once a month, do a whole-body skin exam, using a mirror to look at the skin on your back. Tell your health care provider about new moles, moles that have irregular borders, moles that are larger than a pencil eraser, or moles that have changed in shape or color.  Stay current with required vaccines (immunizations).  Influenza vaccine. All adults should be immunized every year.  Tetanus, diphtheria, and acellular pertussis (Td, Tdap) vaccine. An adult who has not previously received Tdap or who does not know his vaccine status should receive 1 dose of Tdap. This initial dose should be followed by tetanus and diphtheria toxoids (Td) booster doses every 10 years. Adults with an unknown or incomplete history of completing a 3-dose immunization series with Td-containing vaccines should begin or complete a primary immunization series including a Tdap dose. Adults should receive a Td booster every 10 years.  Varicella vaccine. An adult without evidence of immunity to varicella should receive 2 doses or a second dose if he has previously received 1 dose.  Human papillomavirus (HPV) vaccine. Males aged 11-21 years who have not received the vaccine previously should receive the 3-dose series. Males aged 22-26 years may be immunized. Immunization is  recommended through the age of 1 years for any male who has sex with males and did not get any or all doses earlier. Immunization is recommended for any person with an immunocompromised condition through the age of 84 years if he did not get any or all doses earlier. During the 3-dose series, the second dose should be obtained 4-8 weeks after the first dose. The third dose should be obtained 24 weeks after the first dose and 16 weeks after the second dose.  Zoster vaccine. One dose is recommended for adults aged 7 years or older unless certain conditions are present.  Measles, mumps, and rubella (MMR) vaccine. Adults born before  1956 generally are considered immune to measles and mumps. Adults born in [birth year] or later should have 1 or more doses of MMR vaccine unless there is a contraindication to the vaccine or there is laboratory evidence of immunity to each of the three diseases. A routine second dose of MMR vaccine should be obtained at least 28 days after the first dose for students attending postsecondary schools, health care workers, or international travelers. People who received inactivated measles vaccine or an unknown type of measles vaccine during 1963-1967 should receive 2 doses of MMR vaccine. People who received inactivated mumps vaccine or an unknown type of mumps vaccine before 1979 and are at high risk for mumps infection should consider immunization with 2 doses of MMR vaccine. Unvaccinated health care workers born before [birth year] who lack laboratory evidence of measles, mumps, or rubella immunity or laboratory confirmation of disease should consider measles and mumps immunization with 2 doses of MMR vaccine or rubella immunization with 1 dose of MMR vaccine.  Pneumococcal 13-valent conjugate (PCV13) vaccine. When indicated, a person who is uncertain of his immunization history and has no record of immunization should receive the PCV13 vaccine. All adults 28 years of age and older should receive  this vaccine. An adult aged 35 years or older who has certain medical conditions and has not been previously immunized should receive 1 dose of PCV13 vaccine. This PCV13 should be followed with a dose of pneumococcal polysaccharide (PPSV23) vaccine. Adults who are at high risk for pneumococcal disease should obtain the PPSV23 vaccine at least 8 weeks after the dose of PCV13 vaccine. Adults older than 64 years of age who have normal immune system function should obtain the PPSV23 vaccine dose at least 1 year after the dose of PCV13 vaccine.  Pneumococcal polysaccharide (PPSV23) vaccine. When PCV13 is also indicated, PCV13 should be obtained first. All adults aged 82 years and older should be immunized. An adult younger than age 30 years who has certain medical conditions should be immunized. Any person who resides in a nursing home or long-term care facility should be immunized. An adult smoker should be immunized. People with an immunocompromised condition and certain other conditions should receive both PCV13 and PPSV23 vaccines. People with human immunodeficiency virus (HIV) infection should be immunized as soon as possible after diagnosis. Immunization during chemotherapy or radiation therapy should be avoided. Routine use of PPSV23 vaccine is not recommended for American Indians, [place]atives, or people younger than 65 years unless there are medical conditions that require PPSV23 vaccine. When indicated, people who have unknown immunization and have no record of immunization should receive PPSV23 vaccine. One-time revaccination 5 years after the first dose of PPSV23 is recommended for people aged 19-64 years who have chronic kidney failure, nephrotic syndrome, asplenia, or immunocompromised conditions. People who received 1-2 doses of PPSV23 before age 7 years should receive another dose of PPSV23 vaccine at age 82 years or later if at least 5 years have passed since the previous dose. Doses of PPSV23 are  not needed for people immunized with PPSV23 at or after age 31 years.  Meningococcal vaccine. Adults with asplenia or persistent complement component deficiencies should receive 2 doses of quadrivalent meningococcal conjugate (MenACWY-D) vaccine. The doses should be obtained at least 2 months apart. Microbiologists working with certain meningococcal bacteria, [place] recruits, people at risk during an outbreak, and people who travel to or live in countries with a high rate of meningitis should be immunized. A first-year college student up through  age 42 years who is living in a residence hall should receive a dose if he did not receive a dose on or after his 16th birthday. Adults who have certain high-risk conditions should receive one or more doses of vaccine.  Hepatitis A vaccine. Adults who wish to be protected from this disease, have chronic liver disease, work with hepatitis A-infected animals, work in hepatitis A research labs, or travel to or work in countries with a high rate of hepatitis A should be immunized. Adults who were previously unvaccinated and who anticipate close contact with an international adoptee during the first 60 days after arrival in the [place] from a country with a high rate of hepatitis A should be immunized.  Hepatitis B vaccine. Adults should be immunized if they wish to be protected from this disease, are under age 84 years and have diabetes, have chronic liver disease, have had more than one sex partner in the past 6 months, may be exposed to blood or other infectious body fluids, are household contacts or sex partners of hepatitis B positive people, are clients or workers in certain care facilities, or travel to or work in countries with a high rate of hepatitis B.  Haemophilus influenzae type b (Hib) vaccine. A previously unvaccinated person with asplenia or sickle cell disease or having a scheduled splenectomy should receive 1 dose of Hib vaccine. Regardless of  previous immunization, a recipient of a hematopoietic stem cell transplant should receive a 3-dose series 6-12 months after his successful transplant. Hib vaccine is not recommended for adults with HIV infection. Preventive Service / Frequency Ages 35 to 73  Blood pressure check.** / Every 3-5 years.  Lipid and cholesterol check.** / Every 5 years beginning at age 86.  Hepatitis C blood test.** / For any individual with known risks for hepatitis C.  Skin self-exam. / Monthly.  Influenza vaccine. / Every year.  Tetanus, diphtheria, and acellular pertussis (Tdap, Td) vaccine.** / Consult your health care provider. 1 dose of Td every 10 years.  Varicella vaccine.** / Consult your health care provider.  HPV vaccine. / 3 doses over 6 months, if 49 or younger.  Measles, mumps, rubella (MMR) vaccine.** / You need at least 1 dose of MMR if you were born in 1957 or later. You may also need a second dose.  Pneumococcal 13-valent conjugate (PCV13) vaccine.** / Consult your health care provider.  Pneumococcal polysaccharide (PPSV23) vaccine.** / 1 to 2 doses if you smoke cigarettes or if you have certain conditions.  Meningococcal vaccine.** / 1 dose if you are age 48 to 80 years and a Market researcher living in a residence hall, or have one of several medical conditions. You may also need additional booster doses.  Hepatitis A vaccine.** / Consult your health care provider.  Hepatitis B vaccine.** / Consult your health care provider.  Haemophilus influenzae type b (Hib) vaccine.** / Consult your health care provider. Ages 23 to 49  Blood pressure check.** / Every year.  Lipid and cholesterol check.** / Every 5 years beginning at age 71.  Lung cancer screening. / Every year if you are aged 24-80 years and have a 30-pack-year history of smoking and currently smoke or have quit within the past 15 years. Yearly screening is stopped once you have quit smoking for at least 15 years or  develop a health problem that would prevent you from having lung cancer treatment.  Fecal occult blood test (FOBT) of stool. / Every year beginning at  age 82 and continuing until age 66. You may not have to do this test if you get a colonoscopy every 10 years.  Flexible sigmoidoscopy** or colonoscopy.** / Every 5 years for a flexible sigmoidoscopy or every 10 years for a colonoscopy beginning at age 48 and continuing until age 19.  Hepatitis C blood test.** / For all people born from [birth year] through 1965 and any individual with known risks for hepatitis C.  Skin self-exam. / Monthly.  Influenza vaccine. / Every year.  Tetanus, diphtheria, and acellular pertussis (Tdap/Td) vaccine.** / Consult your health care provider. 1 dose of Td every 10 years.  Varicella vaccine.** / Consult your health care provider.  Zoster vaccine.** / 1 dose for adults aged 70 years or older.  Measles, mumps, rubella (MMR) vaccine.** / You need at least 1 dose of MMR if you were born in 1956 or later. You may also need a second dose.  Pneumococcal 13-valent conjugate (PCV13) vaccine.** / Consult your health care provider.  Pneumococcal polysaccharide (PPSV23) vaccine.** / 1 to 2 doses if you smoke cigarettes or if you have certain conditions.  Meningococcal vaccine.** / Consult your health care provider.  Hepatitis A vaccine.** / Consult your health care provider.  Hepatitis B vaccine.** / Consult your health care provider.  Haemophilus influenzae type b (Hib) vaccine.** / Consult your health care provider. Ages 64 and over  Blood pressure check.** / Every year.  Lipid and cholesterol check.**/ Every 5 years beginning at age 56.  Lung cancer screening. / Every year if you are aged 66-80 years and have a 30-pack-year history of smoking and currently smoke or have quit within the past 15 years. Yearly screening is stopped once you have quit smoking for at least 15 years or develop a health problem that would  prevent you from having lung cancer treatment.  Fecal occult blood test (FOBT) of stool. / Every year beginning at age 11 and continuing until age 60. You may not have to do this test if you get a colonoscopy every 10 years.  Flexible sigmoidoscopy** or colonoscopy.** / Every 5 years for a flexible sigmoidoscopy or every 10 years for a colonoscopy beginning at age 87 and continuing until age 13.  Hepatitis C blood test.** / For all people born from [birth year] through 1964 and any individual with known risks for hepatitis C.  Abdominal aortic aneurysm (AAA) screening.** / A one-time screening for ages 75 to 38 years who are current or former smokers.  Skin self-exam. / Monthly.  Influenza vaccine. / Every year.  Tetanus, diphtheria, and acellular pertussis (Tdap/Td) vaccine.** / 1 dose of Td every 10 years.  Varicella vaccine.** / Consult your health care provider.  Zoster vaccine.** / 1 dose for adults aged 51 years or older.  Pneumococcal 13-valent conjugate (PCV13) vaccine.** / 1 dose for all adults aged 94 years and older.  Pneumococcal polysaccharide (PPSV23) vaccine.** / 1 dose for all adults aged 65 years and older.  Meningococcal vaccine.** / Consult your health care provider.  Hepatitis A vaccine.** / Consult your health care provider.  Hepatitis B vaccine.** / Consult your health care provider.  Haemophilus influenzae type b (Hib) vaccine.** / Consult your health care provider. **Family history and personal history of risk and conditions may change your health care provider's recommendations.   This information is not intended to replace advice given to you by your health care provider. Make sure you discuss any questions you have with your health care provider.   Document  Released: 11/25/2001 Document Revised: 10/20/2014 Document Reviewed: 02/24/2011 Elsevier Interactive Patient Education 2016 [employer]. or [place]. Treat fevers as needed. Vitamin C, aged garlic, elderberry  for viruses

## 2015-11-26 NOTE — Assessment & Plan Note (Signed)
Antihistamines prn works well

## 2015-11-26 NOTE — Assessment & Plan Note (Signed)
Doing better, did have some mild symptoms yesterday, resolved today.

## 2015-11-26 NOTE — Assessment & Plan Note (Addendum)
Patient encouraged to maintain heart healthy diet, regular exercise, adequate sleep. Consider daily probiotics. Take medications as prescribed. Patient seen with and examined with student.  Agree with documentation See separate note for further documentation Colonoscopy 2011 repeat in 10 years

## 2015-11-26 NOTE — Assessment & Plan Note (Signed)
RRR today 

## 2015-11-26 NOTE — Progress Notes (Signed)
Pre visit review using our clinic review tool, if applicable. No additional management support is needed unless otherwise documented below in the visit note. 

## 2015-11-26 NOTE — Assessment & Plan Note (Signed)
hgba1c acceptable, minimize simple carbs. Increase exercise as tolerated.  

## 2016-01-07 ENCOUNTER — Other Ambulatory Visit: Payer: Self-pay | Admitting: Family Medicine

## 2016-02-04 NOTE — Progress Notes (Signed)
HPI: FU atrial flutter. Patient seen in the emergency room in December, 2015 with atrial flutter. Follow-up ECG showed he converted to sinus rhythm. Echocardiogram February 2016 showed normal LV function, grade 1 diastolic dysfunction. Had ablation February 2016. Since last seen, the patient denies any dyspnea on exertion, orthopnea, PND, pedal edema, palpitations, syncope or chest pain.   Current Outpatient Prescriptions  Medication Sig Dispense Refill  . aspirin 81 MG tablet Take 81 mg by mouth daily.    . metoprolol succinate (TOPROL-XL) 50 MG 24 hr tablet Take 1 tablet (50 mg total) by mouth daily. Take with or immediately following a meal. 90 tablet 3  . Multiple Vitamin (MULTIVITAMIN) tablet Take 1 tablet by mouth daily.      Marland Kitchen omeprazole (PRILOSEC) 20 MG capsule TAKE 1 CAPSULE BY MOUTH EVERY DAY 15 MINUTES BEFORE MORNING MEAL AS NEEDED FOR HEARTBURN OR ACID REFLUX (Patient taking differently: every other day. TAKE 1 CAPSULE BY MOUTH EVERY DAY 15 MINUTES BEFORE MORNING MEAL AS NEEDED FOR HEARTBURN OR ACID REFLUX) 90 capsule 1  . quinapril-hydrochlorothiazide (ACCURETIC) 20-12.5 MG tablet Take 1 tablet by mouth daily. 90 tablet 3  . sildenafil (REVATIO) 20 MG tablet Please take 1-4 tablets by mouth as needed for ED 50 tablet 1  . simvastatin (ZOCOR) 20 MG tablet Take 1 tablet (20 mg total) by mouth at bedtime. 90 tablet 2  . tamsulosin (FLOMAX) 0.4 MG CAPS capsule TAKE ONE CAPSULE BY MOUTH ONCE DAILY 30 capsule 4   No current facility-administered medications for this visit.     Past Medical History  Diagnosis Date  . Allergic rhinitis   . Hyperlipidemia   . Hypertension   . History of carpal tunnel syndrome   . Ankle fracture 1998  . OA (osteoarthritis) 02/06/2013    knees  . Hammer toe of right foot 05/06/2013  . GERD (gastroesophageal reflux disease)   . Atrial flutter ([place])     a. s/p CTI ablation by Dr Rayann Heman 11/2014  . Insect bite 04/23/2015  . Back pain 08/05/2015    . BPH (benign prostatic hyperplasia) 08/26/2015    Past Surgical History  Procedure Laterality Date  . Inguinal hernia repair  2008  . Colonoscopy  08/04/2002    Normal Exam- Dr Henrene Pastor  . Carpel tunnel    . Ankle surgery    . Tonsillectomy    . Ablation  11-24-14    CTI ablation by Dr Rayann Heman  . Atrial flutter ablation N/A 11/24/2014    Procedure: ATRIAL FLUTTER ABLATION;  Surgeon: Thompson Grayer, MD;  Location: [facility] CATH LAB;  Service: Cardiovascular;  Laterality: N/A;    Social History   Social History  . Marital Status: Married    Spouse Name: N/A  . Number of Children: 1  . Years of Education: N/A   Occupational History  .      Sales   Social History Main Topics  . Smoking status: Former Research scientist (life sciences)  . Smokeless tobacco: Never Used     Comment: 5-10 pack year history  . Alcohol Use: Yes     Comment: 2-3 glasses of wine  . Drug Use: Not on file  . Sexual Activity: Yes   Other Topics Concern  . 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 Hyperlipidemia    . Hypertension    . Coronary artery disease Father     aortic valve disease  . Heart attack Father 26  . COPD Father   . Hypertension Father   . Hyperlipidemia Father   . Dementia Father   . Diabetes type II Maternal Grandfather   . Arthritis Mother   . Stroke Mother   . Diabetes Sister   . Diabetes Brother     ROS: no fevers or chills, productive cough, hemoptysis, dysphasia, odynophagia, melena, hematochezia, dysuria, hematuria, rash, seizure activity, orthopnea, PND, pedal edema, claudication. Remaining systems are negative.  Physical Exam: Well-developed well-nourished in no acute distress.  Skin is warm and dry.  HEENT is normal.  Neck  is supple.  Chest is clear to auscultation with normal expansion.  Cardiovascular exam is regular rate and rhythm.  Abdominal exam nontender or distended. No masses palpated. Extremities show no edema. neuro grossly intact  ECG Sinus bradycardia, normal axis, no ST changes.

## 2016-02-06 ENCOUNTER — Encounter: Payer: Self-pay | Admitting: Cardiology

## 2016-02-06 ENCOUNTER — Ambulatory Visit (INDEPENDENT_AMBULATORY_CARE_PROVIDER_SITE_OTHER): Payer: 59 | Admitting: Cardiology

## 2016-02-06 VITALS — BP 156/83 | HR 61 | Ht 72.0 in | Wt 192.8 lb

## 2016-02-06 DIAGNOSIS — E782 Mixed hyperlipidemia: Secondary | ICD-10-CM

## 2016-02-06 DIAGNOSIS — I10 Essential (primary) hypertension: Secondary | ICD-10-CM | POA: Diagnosis not present

## 2016-02-06 DIAGNOSIS — I4892 Unspecified atrial flutter: Secondary | ICD-10-CM

## 2016-02-06 NOTE — Assessment & Plan Note (Signed)
Continue statin. 

## 2016-02-06 NOTE — Assessment & Plan Note (Signed)
Blood pressure elevated. However he follows this closely at home and it is typically controlled. Continue present medications and follow. Advance regimen as needed.

## 2016-02-06 NOTE — Assessment & Plan Note (Signed)
Patient is status post ablation. He remains in sinus rhythm. Continue aspirin and Toprol.

## 2016-02-06 NOTE — Patient Instructions (Signed)
Your physician recommends that you schedule a follow-up appointment in: as needed  

## 2016-02-19 ENCOUNTER — Encounter: Payer: Self-pay | Admitting: Family Medicine

## 2016-02-21 ENCOUNTER — Other Ambulatory Visit: Payer: Self-pay | Admitting: Family Medicine

## 2016-02-21 MED ORDER — METOPROLOL SUCCINATE ER 100 MG PO TB24: 100.0000 mg | ORAL_TABLET | Freq: Every day | ORAL | Status: DC

## 2016-04-04 ENCOUNTER — Other Ambulatory Visit: Payer: Self-pay | Admitting: Family Medicine

## 2016-04-04 ENCOUNTER — Encounter: Payer: Self-pay | Admitting: Family Medicine

## 2016-04-04 MED ORDER — METOPROLOL SUCCINATE ER 100 MG PO TB24: 100.0000 mg | ORAL_TABLET | Freq: Every day | ORAL | Status: DC

## 2016-04-07 ENCOUNTER — Other Ambulatory Visit: Payer: Self-pay | Admitting: Family Medicine

## 2016-05-26 ENCOUNTER — Encounter: Payer: Self-pay | Admitting: Family Medicine

## 2016-05-26 ENCOUNTER — Ambulatory Visit (INDEPENDENT_AMBULATORY_CARE_PROVIDER_SITE_OTHER): Payer: 59 | Admitting: Family Medicine

## 2016-05-26 VITALS — BP 132/86 | HR 54 | Temp 98.2°F | Ht 72.0 in | Wt 193.5 lb

## 2016-05-26 DIAGNOSIS — R739 Hyperglycemia, unspecified: Secondary | ICD-10-CM

## 2016-05-26 DIAGNOSIS — E782 Mixed hyperlipidemia: Secondary | ICD-10-CM

## 2016-05-26 DIAGNOSIS — Z0000 Encounter for general adult medical examination without abnormal findings: Secondary | ICD-10-CM

## 2016-05-26 DIAGNOSIS — K219 Gastro-esophageal reflux disease without esophagitis: Secondary | ICD-10-CM

## 2016-05-26 DIAGNOSIS — R7989 Other specified abnormal findings of blood chemistry: Secondary | ICD-10-CM

## 2016-05-26 DIAGNOSIS — I10 Essential (primary) hypertension: Secondary | ICD-10-CM | POA: Diagnosis not present

## 2016-05-26 LAB — COMPREHENSIVE METABOLIC PANEL
ALBUMIN: 4.4 g/dL (ref 3.5–5.2)
ALT: 18 U/L (ref 0–53)
AST: 17 U/L (ref 0–37)
Alkaline Phosphatase: 40 U/L (ref 39–117)
BUN: 12 mg/dL (ref 6–23)
CHLORIDE: 100 meq/L (ref 96–112)
CO2: 30 meq/L (ref 19–32)
Calcium: 9.5 mg/dL (ref 8.4–10.5)
Creatinine, Ser: 0.95 mg/dL (ref 0.40–1.50)
GFR: 84.8 mL/min (ref >60.00)
Glucose, Bld: 110 mg/dL — ABNORMAL HIGH (ref 70–99)
POTASSIUM: 4.4 meq/L (ref 3.5–5.1)
SODIUM: 137 meq/L (ref 135–145)
Total Bilirubin: 0.6 mg/dL (ref 0.2–1.2)
Total Protein: 7.3 g/dL (ref 6.0–8.3)

## 2016-05-26 LAB — LIPID PANEL
CHOLESTEROL: 156 mg/dL (ref 0–200)
HDL: 53.8 mg/dL (ref >39.00)
LDL Cholesterol: 79 mg/dL (ref 0–99)
NONHDL: 101.97
Total CHOL/HDL Ratio: 3
Triglycerides: 117 mg/dL (ref 0.0–149.0)
VLDL: 23.4 mg/dL (ref 0.0–40.0)

## 2016-05-26 LAB — CBC
HEMATOCRIT: 43.8 % (ref 39.0–52.0)
Hemoglobin: 15.1 g/dL (ref 13.0–17.0)
MCHC: 34.5 g/dL (ref 30.0–36.0)
MCV: 93.6 fl (ref 78.0–100.0)
Platelets: 243 10*3/uL (ref 150.0–400.0)
RBC: 4.68 Mil/uL (ref 4.22–5.81)
RDW: 12.9 % (ref 11.5–15.5)
WBC: 7.8 10*3/uL (ref 4.0–10.5)

## 2016-05-26 LAB — HIV ANTIBODY (ROUTINE TESTING W REFLEX): HIV 1&2 Ab, 4th Generation: NONREACTIVE

## 2016-05-26 LAB — HEMOGLOBIN A1C: HEMOGLOBIN A1C: 5.8 % (ref 4.6–6.5)

## 2016-05-26 LAB — HEPATITIS C ANTIBODY: HCV Ab: NEGATIVE

## 2016-05-26 LAB — TSH: TSH: 1.33 u[IU]/mL (ref 0.35–4.50)

## 2016-05-26 MED ORDER — QUINAPRIL-HYDROCHLOROTHIAZIDE 20-12.5 MG PO TABS: 1.0000 | ORAL_TABLET | Freq: Every day | ORAL | 3 refills | Status: DC

## 2016-05-26 NOTE — Assessment & Plan Note (Signed)
Tolerating statin, encouraged heart healthy diet, avoid trans fats, minimize simple carbs and saturated fats. Increase exercise as tolerated 

## 2016-05-26 NOTE — Progress Notes (Signed)
Patient ID: William Phillips, male   DOB: 1951-09-01, 65 y.o.   MRN: [id number]   Subjective:    Patient ID: William Phillips, male    DOB: 08/27/1951, 65 y.o.   MRN: [id number]  Chief Complaint  Patient presents with  . Follow-up    HPI Patient is in today for follow up. Doing very well, no recent illness or acute concerns. No hospitalizations. Is staying active and eating well. Denies CP/palp/SOB/HA/congestion/fevers/GI or GU c/o. Taking meds as prescribed  Past Medical History:  Diagnosis Date  . Allergic rhinitis   . Ankle fracture 1998  . Atrial flutter ([place])    a. s/p CTI ablation by Dr Rayann Heman 11/2014  . Back pain 08/05/2015  . BPH (benign prostatic hyperplasia) 08/26/2015  . GERD (gastroesophageal reflux disease)   . Hammer toe of right foot 05/06/2013  . History of carpal tunnel syndrome   . Hyperlipidemia   . Hypertension   . Insect bite 04/23/2015  . OA (osteoarthritis) 02/06/2013   knees    Past Surgical History:  Procedure Laterality Date  . ABLATION  11-24-14   CTI ablation by Dr Rayann Heman  . ANKLE SURGERY    . ATRIAL FLUTTER ABLATION N/A 11/24/2014   Procedure: ATRIAL FLUTTER ABLATION;  Surgeon: Thompson Grayer, MD;  Location: [facility] CATH LAB;  Service: Cardiovascular;  Laterality: N/A;  . Carpel tunnel    . COLONOSCOPY  08/04/2002   Normal Exam- Dr Henrene Pastor  . INGUINAL HERNIA REPAIR  2008  . TONSILLECTOMY      Family History  Problem Relation Age of Onset  . Hyperlipidemia    . Hypertension    . Coronary artery disease Father     aortic valve disease  . Heart attack Father 51  . COPD Father   . Hypertension Father   . Hyperlipidemia Father   . Dementia Father   . Diabetes type II Maternal Grandfather   . Arthritis Mother   . Stroke Mother   . Diabetes Sister   . Diabetes Brother     Social History   Social History  . Marital status: Married    Spouse name: N/A  . Number of children: 1  . Years of education: N/A   Occupational History  .      Sales     Social History Main Topics  . Smoking status: Former Research scientist (life sciences)  . Smokeless tobacco: Never Used     Comment: 5-10 pack year history  . Alcohol use Yes     Comment: 2-3 glasses of wine  . Drug use: Unknown  . Sexual activity: Yes   Other Topics Concern  . 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 aspirin 81 MG tablet Take 81 mg by mouth daily.    . metoprolol succinate (TOPROL-XL) 100 MG 24 hr tablet Take 1 tablet (100 mg total) by mouth daily. Take with or immediately following a meal. 90 tablet 1  . Multiple Vitamin (MULTIVITAMIN) tablet Take 1 tablet by mouth daily.      Marland Kitchen omeprazole (PRILOSEC) 20 MG capsule TAKE 1 CAPSULE BY MOUTH EVERY DAY 15 MINUTES BEFORE MORNING MEAL AS NEEDED  FOR HEARTBURN OR ACID REFLUX (Patient taking differently: every other day. TAKE 1 CAPSULE BY MOUTH EVERY DAY 15 MINUTES BEFORE MORNING MEAL AS NEEDED FOR HEARTBURN OR ACID REFLUX) 90 capsule 1  . sildenafil (REVATIO) 20 MG tablet Please take 1-4 tablets by mouth as needed for ED 50 tablet 1  . simvastatin (ZOCOR) 20 MG tablet TAKE ONE TABLET BY MOUTH AT BEDTIME 90 tablet 0  . tamsulosin (FLOMAX) 0.4 MG CAPS capsule TAKE ONE CAPSULE BY MOUTH ONCE DAILY 30 capsule 4  . quinapril-hydrochlorothiazide (ACCURETIC) 20-12.5 MG tablet Take 1 tablet by mouth daily. 90 tablet 3   No facility-administered medications prior to visit.     Allergies  Allergen Reactions  . Penicillins     REACTION: swelling , irriation    Review of Systems  Constitutional: Negative for fever and malaise/fatigue.  HENT: Negative for congestion.   Eyes: Negative for blurred vision.  Respiratory: Negative for shortness of breath.    Cardiovascular: Negative for chest pain, palpitations and leg swelling.  Gastrointestinal: Negative for abdominal pain, blood in stool and nausea.  Genitourinary: Negative for dysuria and frequency.  Musculoskeletal: Negative for falls.  Skin: Negative for rash.  Neurological: Negative for dizziness, loss of consciousness and headaches.  Endo/Heme/Allergies: Negative for environmental allergies.  Psychiatric/Behavioral: Negative for depression. The patient is not nervous/anxious.        Objective:    Physical Exam  Constitutional: He is oriented to person, place, and time. He appears well-developed and well-nourished. No distress.  HENT:  Head: Normocephalic and atraumatic.  Nose: Nose normal.  Eyes: Right eye exhibits no discharge. Left eye exhibits no discharge.  Neck: Normal range of motion. Neck supple.  Cardiovascular: Normal rate and regular rhythm.   No murmur heard. Pulmonary/Chest: Effort normal and breath sounds normal.  Abdominal: Soft. Bowel sounds are normal. There is no tenderness.  Musculoskeletal: He exhibits no edema.  Neurological: He is alert and oriented to person, place, and time.  Skin: Skin is warm and dry.  Psychiatric: He has a normal mood and affect.  Nursing note and vitals reviewed.   BP 132/86 (BP Location: Left Arm, Patient Position: Sitting, Cuff Size: Normal)   Pulse (!) 54   Temp 98.2 F (36.8 C) (Oral)   Ht 6' (1.829 m)   Wt 193 lb 8 oz (87.8 kg)   SpO2 96%   BMI 26.24 kg/m  Wt Readings from Last 3 Encounters:  05/26/16 193 lb 8 oz (87.8 kg)  02/06/16 192 lb 12.8 oz (87.5 kg)  11/26/15 192 lb 2 oz (87.1 kg)     Lab Results  Component Value Date   WBC 7.2 10/19/2015   HGB 15.2 10/19/2015   HCT 45.0 10/19/2015   PLT 220.0 10/19/2015   GLUCOSE 109 (H) 10/19/2015   CHOL 190 10/19/2015   TRIG 149.0 10/19/2015   HDL 64.90 10/19/2015   LDLCALC 95 10/19/2015   ALT 17 10/19/2015   AST 18 10/19/2015   NA 138 10/19/2015   K 3.8  10/19/2015   CL 100 10/19/2015   CREATININE 0.98 10/19/2015   BUN 14 10/19/2015   CO2 28 10/19/2015   TSH 1.09 10/19/2015   PSA 1.63 10/19/2015   HGBA1C 5.7 10/19/2015   MICROALBUR 0.7 10/19/2015    Lab Results  Component Value Date   TSH 1.09 10/19/2015   Lab Results  Component Value Date   WBC 7.2 10/19/2015   HGB 15.2 10/19/2015   HCT 45.0 10/19/2015   MCV 94.8  10/19/2015   PLT 220.0 10/19/2015   Lab Results  Component Value Date   NA 138 10/19/2015   K 3.8 10/19/2015   CO2 28 10/19/2015   GLUCOSE 109 (H) 10/19/2015   BUN 14 10/19/2015   CREATININE 0.98 10/19/2015   BILITOT 0.7 10/19/2015   ALKPHOS 42 10/19/2015   AST 18 10/19/2015   ALT 17 10/19/2015   PROT 7.0 10/19/2015   ALBUMIN 4.4 10/19/2015   CALCIUM 9.3 10/19/2015   ANIONGAP 15 09/25/2014   GFR 81.97 10/19/2015   Lab Results  Component Value Date   CHOL 190 10/19/2015   Lab Results  Component Value Date   HDL 64.90 10/19/2015   Lab Results  Component Value Date   LDLCALC 95 10/19/2015   Lab Results  Component Value Date   TRIG 149.0 10/19/2015   Lab Results  Component Value Date   CHOLHDL 3 10/19/2015   Lab Results  Component Value Date   HGBA1C 5.7 10/19/2015       Assessment & Plan:   Problem List Items Addressed This Visit    Hyperlipidemia, mixed    Tolerating statin, encouraged heart healthy diet, avoid trans fats, minimize simple carbs and saturated fats. Increase exercise as tolerated      Relevant Medications   quinapril-hydrochlorothiazide (ACCURETIC) 20-12.5 MG tablet   Other Relevant Orders   Lipid panel   Essential hypertension - Primary    Well controlled, no changes to meds. Encouraged heart healthy diet such as the DASH diet and exercise as tolerated.       Relevant Medications   quinapril-hydrochlorothiazide (ACCURETIC) 20-12.5 MG tablet   Other Relevant Orders   TSH   CBC   Comprehensive metabolic panel   GERD    Avoid offending foods, start  probiotics. Do not eat large meals in late evening and consider raising head of bed.       Preventative health care   Relevant Orders   HIV antibody (with reflex)   Hepatitis C Antibody   Hyperglycemia    minimize simple carbs. Increase exercise as tolerated.       Relevant Orders   Hemoglobin A1c   Abnormal TSH    Check TSH today       Other Visit Diagnoses   None.     I am having Mr. Kozlowski maintain his multivitamin, aspirin, sildenafil, omeprazole, tamsulosin, metoprolol succinate, simvastatin, and quinapril-hydrochlorothiazide.  Meds ordered this encounter  Medications  . quinapril-hydrochlorothiazide (ACCURETIC) 20-12.5 MG tablet    Sig: Take 1 tablet by mouth daily.    Dispense:  90 tablet    Refill:  3     Penni Homans, MD

## 2016-05-26 NOTE — Assessment & Plan Note (Signed)
Avoid offending foods, start probiotics. Do not eat large meals in late evening and consider raising head of bed.  

## 2016-05-26 NOTE — Assessment & Plan Note (Addendum)
minimize simple carbs. Increase exercise as tolerated.  

## 2016-05-26 NOTE — Assessment & Plan Note (Signed)
Check TSH today

## 2016-05-26 NOTE — Patient Instructions (Signed)

## 2016-05-26 NOTE — Assessment & Plan Note (Signed)
Well controlled, no changes to meds. Encouraged heart healthy diet such as the DASH diet and exercise as tolerated.  °

## 2016-05-26 NOTE — Progress Notes (Signed)
Pre visit review using our clinic review tool, if applicable. No additional management support is needed unless otherwise documented below in the visit note. 

## 2016-06-24 ENCOUNTER — Other Ambulatory Visit: Payer: Self-pay | Admitting: Family Medicine

## 2016-07-14 ENCOUNTER — Other Ambulatory Visit: Payer: Self-pay | Admitting: Family Medicine

## 2016-07-14 MED ORDER — SILDENAFIL CITRATE 20 MG PO TABS: ORAL_TABLET | ORAL | 1 refills | Status: DC

## 2016-07-18 ENCOUNTER — Ambulatory Visit (INDEPENDENT_AMBULATORY_CARE_PROVIDER_SITE_OTHER): Payer: 59 | Admitting: Emergency Medicine

## 2016-07-18 DIAGNOSIS — Z23 Encounter for immunization: Secondary | ICD-10-CM | POA: Diagnosis not present

## 2016-09-22 ENCOUNTER — Encounter: Payer: Self-pay | Admitting: Physician Assistant

## 2016-09-22 ENCOUNTER — Ambulatory Visit (INDEPENDENT_AMBULATORY_CARE_PROVIDER_SITE_OTHER): Payer: 59 | Admitting: Physician Assistant

## 2016-09-22 VITALS — BP 160/84 | HR 57 | Ht 72.0 in | Wt 194.8 lb

## 2016-09-22 DIAGNOSIS — I10 Essential (primary) hypertension: Secondary | ICD-10-CM | POA: Diagnosis not present

## 2016-09-22 DIAGNOSIS — I4892 Unspecified atrial flutter: Secondary | ICD-10-CM

## 2016-09-22 DIAGNOSIS — K219 Gastro-esophageal reflux disease without esophagitis: Secondary | ICD-10-CM | POA: Diagnosis not present

## 2016-09-22 DIAGNOSIS — E782 Mixed hyperlipidemia: Secondary | ICD-10-CM

## 2016-09-22 NOTE — Patient Instructions (Signed)
Medication change  Please take Quinapril-HCTZ in the morning  And Take Metoprolol succinate in the evening @ 6 pm No other changes.   Please send blood pressure reading for next week through Mychart to [facility].   Your physician wants you to follow-up in [place].You will receive a reminder letter in the mail two months in advance. If you don't receive a letter, please call our office to schedule the follow-up appointment.  If you need a refill on your cardiac medications before your next appointment, please call your pharmacy.

## 2016-09-22 NOTE — Progress Notes (Signed)
Cardiology Office Note    Date:  09/22/2016   ID:  AMARIE SKEMP, DOB May 24, 1951, MRN [id number]  PCP:  Penni Homans, MD  Cardiologist:  Dr. Stanford Breed Electrophysiologist: Dr. Rayann Heman  Chief Complaint  Patient presents with  . Follow-up    seen for Dr. Stanford Breed    History of Present Illness:  ARCADIO CRUTE is a 65 y.o. male with PMH of HTN, HLD and atrial flutter s/p ablation 11/2014 by Dr. Rayann Heman. He was first diagnosed with atrial flutter in 2015. Last echocardiogram obtained on 11/15/2014 showed EF 0000000, grade 1 diastolic dysfunction. His last office visit was on 02/06/2016, he remained on aspirin and Toprol. He had no recurrence of atrial flutter. It was recommended for him to follow-up as needed.   Patient has been doing quite well from cardiology standpoint. He denies any recent chest discomfort, shortness breath, fluttering sensation, lower extremity edema, orthopnea or paroxysmal nocturnal dyspnea. He has been noting his blood pressure has been running high especially in the morning. He take both Toprol-XL and Accuretic at 6 PM every night. Based on his home blood pressure diary, his systolic blood pressure usually ranging in the 140s to 160s in the morning, however after he take his nighttime education, his systolic blood pressure would drop down to 100 to 110s. He did have one episode of night sweats several weeks ago, however no recurrence. Overall, patient has been doing quite well. For his blood pressure issue, his bradycardia prevent any further up titration of the metoprolol. At this point, I think it is reasonable to adjust the timing of his blood pressure which may have more effect on his blood pressure variations throughout the day. I have recommended for him to change his Accuretic dose to every morning usage of every night. He will continue to take Toprol-XL every night. He will send me his next week's blood pressure reading through my chart, if his blood pressure  persistently to be high, I will consider adding 5 mg amlodipine to his medical regimen.   Past Medical History:  Diagnosis Date  . Allergic rhinitis   . Ankle fracture 1998  . Atrial flutter ([place])    a. s/p CTI ablation by Dr Rayann Heman 11/2014  . Back pain 08/05/2015  . BPH (benign prostatic hyperplasia) 08/26/2015  . GERD (gastroesophageal reflux disease)   . Hammer toe of right foot 05/06/2013  . History of carpal tunnel syndrome   . Hyperlipidemia   . Hypertension   . Insect bite 04/23/2015  . OA (osteoarthritis) 02/06/2013   knees    Past Surgical History:  Procedure Laterality Date  . ABLATION  11-24-14   CTI ablation by Dr Rayann Heman  . ANKLE SURGERY    . ATRIAL FLUTTER ABLATION N/A 11/24/2014   Procedure: ATRIAL FLUTTER ABLATION;  Surgeon: Thompson Grayer, MD;  Location: [facility] CATH LAB;  Service: Cardiovascular;  Laterality: N/A;  . Carpel tunnel    . COLONOSCOPY  08/04/2002   Normal Exam- Dr Henrene Pastor  . INGUINAL HERNIA REPAIR  2008  . TONSILLECTOMY      Current Medications: Outpatient Medications Prior to Visit  Medication Sig Dispense Refill  . aspirin 81 MG tablet Take 81 mg by mouth daily.    . metoprolol succinate (TOPROL-XL) 100 MG 24 hr tablet Take 1 tablet (100 mg total) by mouth daily. Take with or immediately following a meal. 90 tablet 1  . Multiple Vitamin (MULTIVITAMIN) tablet Take 1 tablet by mouth daily.      Marland Kitchen  omeprazole (PRILOSEC) 20 MG capsule TAKE 1 CAPSULE BY MOUTH EVERY DAY 15 MINUTES BEFORE MORNING MEAL AS NEEDED FOR HEARTBURN OR ACID REFLUX 90 capsule 1  . quinapril-hydrochlorothiazide (ACCURETIC) 20-12.5 MG tablet Take 1 tablet by mouth daily. 90 tablet 3  . sildenafil (REVATIO) 20 MG tablet Please take 1-4 tablets by mouth as needed for ED 50 tablet 1  . simvastatin (ZOCOR) 20 MG tablet TAKE ONE TABLET BY MOUTH AT BEDTIME 90 tablet 0  . tamsulosin (FLOMAX) 0.4 MG CAPS capsule TAKE ONE CAPSULE BY MOUTH ONCE DAILY 30 capsule 4   No facility-administered  medications prior to visit.      Allergies:   Penicillins   Social History   Social History  . Marital status: Married    Spouse name: N/A  . Number of children: 1  . Years of education: N/A   Occupational History  .      Sales   Social History Main Topics  . Smoking status: Former Research scientist (life sciences)  . Smokeless tobacco: Never Used     Comment: 5-10 pack year history  . Alcohol use Yes     Comment: 2-3 glasses of wine  . Drug use: Unknown  . Sexual activity: Yes   Other Topics Concern  . 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   ROS:   Please see the history of present illness.    ROS All other systems reviewed and are negative.   PHYSICAL EXAM:   VS:  BP (!) 160/84   Pulse (!) 57   Ht 6' (1.829 m)   Wt 194 lb 12.8 oz (88.4 kg)   BMI 26.42 kg/m    GEN: Well nourished, well developed, in no acute distress  HEENT: normal  Neck: no JVD, carotid bruits, or masses Cardiac: RRR; no murmurs, rubs, or gallops,no edema  Respiratory:  clear to auscultation bilaterally, normal work of breathing GI: soft, nontender, nondistended, + BS MS: no deformity or atrophy  Skin: warm and dry, no rash Neuro:  Alert and Oriented x 3, Strength and sensation are intact Psych: euthymic mood, full affect  Wt Readings from Last 3 Encounters:  09/22/16 194 lb 12.8 oz (88.4 kg)  05/26/16 193 lb 8 oz (87.8 kg)    02/06/16 192 lb 12.8 oz (87.5 kg)      Studies/Labs Reviewed:   EKG:  EKG is ordered today.  The ekg ordered today demonstrates Sinus bradycardia without significant ST-T wave changes. Heart rate 57, QTC 404.  Recent Labs: 05/26/2016: ALT 18; BUN 12; Creatinine, Ser 0.95; Hemoglobin 15.1; Platelets 243.0; Potassium 4.4; Sodium 137; TSH 1.33   Lipid Panel    Component Value Date/Time   CHOL 156 05/26/2016 0848   TRIG 117.0 05/26/2016 0848   HDL 53.80 05/26/2016 0848   CHOLHDL 3 05/26/2016 0848   VLDL 23.4 05/26/2016 0848   LDLCALC 79  05/26/2016 0848    Additional studies/ records that were reviewed today include:   Echo 11/15/2014 LV EF: 55% -  60%  - Left ventricle: The cavity size was normal. Wall thickness was normal. Systolic function was normal. The estimated ejection fraction was in the range of 55% to 60%. Wall motion was normal; there were no regional wall motion abnormalities. Doppler parameters are consistent with abnormal left ventricular relaxation (grade 1 diastolic dysfunction). The E/e&' ratio is between 8-15, suggesting indeterminate LV filling pressure. - Left atrium: The atrium was normal in size. - Right atrium: The atrium was normal in size.  Impressions:  - LVEF 55-60%, normal wall thickness, normal chamber sizes, diastolic dysfunction, indeterminate LV filling pressure.    EPS and ablation of aflutter 11/24/2014 by Dr. Rayann Heman PROCEDURES:  1. Comprehensive electrophysiology study.  2. Coronary sinus pacing and recording.  3. Mapping of atrial flutter.  5. Ablation of atrial flutter.  6. Arrhythmia induction with pacing and dobutamine infusion  CONCLUSIONS:  1. Sinus rhythm upon presentation.  2. Sustained Atrial flutter, not induced today  3. Empiric cavotricuspid isthmus ablation was performed with complete bidirectional isthmus block achieved.  4. No inducible arrhythmias following ablation both on and off of dobutamine.   5. No early apparent complications   ASSESSMENT:    1. Atrial flutter, unspecified type ([place])   2. Essential hypertension   3. Gastroesophageal reflux disease without esophagitis   4. Hyperlipidemia, mixed      PLAN:  In order of problems listed above:  1. H/o paroxysmal atrial flutter s/p ablation in 2016: No sign of recurrence, and denies any recent palpitation. Heart rate slow at baseline, however asymptomatic. We'll continue on current dose of Toprol-XL. Unable to up titrate due to bradycardia.  2. Hypertension: Blood pressure has been recently elevated especially in the morning, low to normal at night, incentive adding additional medication, I have asked him to switch his Accuretic to take in the morning instead and Toprol-XL at around 6 PM. He will continue to measure his blood pressure for the next week, and based on next week's blood pressure, I will decide whether or not to add amlodipine.  3. Hyperlipidemia: On Zocor 20 mg daily.    Medication Adjustments/Labs and Tests Ordered: Current medicines are reviewed at length with the patient today.  Concerns regarding medicines are outlined above.  Medication changes, Labs and Tests ordered today are listed in the Patient Instructions below. Patient Instructions  Medication change  Please take Quinapril-HCTZ in the morning  And Take Metoprolol succinate in the evening @ 6 pm No other changes.   Please send blood pressure reading for next week through Mychart to [facility].   Your physician wants you to follow-up in [place].You will receive a reminder letter in the mail two months in advance. If you don't receive a letter, please call our office to schedule the follow-up appointment.  If you need a refill on your cardiac medications before your next appointment, please call your pharmacy.     Hilbert Corrigan, [place]  09/22/2016 10:04 AM    [address],  [place], [place]  [address] Phone: [phone]; Fax: [phone]

## 2016-10-08 ENCOUNTER — Other Ambulatory Visit: Payer: Self-pay | Admitting: Family Medicine

## 2016-10-10 ENCOUNTER — Other Ambulatory Visit: Payer: Self-pay | Admitting: Family Medicine

## 2016-10-14 ENCOUNTER — Encounter: Payer: Self-pay | Admitting: Physician Assistant

## 2016-10-15 ENCOUNTER — Other Ambulatory Visit: Payer: Self-pay | Admitting: Physician Assistant

## 2016-10-15 MED ORDER — AMLODIPINE BESYLATE 5 MG PO TABS: 5.0000 mg | ORAL_TABLET | Freq: Every day | ORAL | 11 refills | Status: DC

## 2016-10-26 ENCOUNTER — Encounter: Payer: Self-pay | Admitting: Physician Assistant

## 2016-10-27 ENCOUNTER — Other Ambulatory Visit: Payer: Self-pay | Admitting: Physician Assistant

## 2016-10-27 MED ORDER — AMLODIPINE BESYLATE 10 MG PO TABS
5.0000 mg | ORAL_TABLET | Freq: Two times a day (BID) | ORAL | 3 refills | Status: DC
Start: 2016-10-27 — End: 2017-02-03

## 2016-10-27 MED ORDER — AMLODIPINE BESYLATE 5 MG PO TABS: 5.0000 mg | ORAL_TABLET | Freq: Two times a day (BID) | ORAL | 11 refills | Status: DC

## 2016-11-06 ENCOUNTER — Other Ambulatory Visit: Payer: Self-pay | Admitting: Family Medicine

## 2016-12-01 ENCOUNTER — Encounter: Payer: 59 | Admitting: Family Medicine

## 2016-12-09 ENCOUNTER — Encounter: Payer: 59 | Admitting: Family Medicine

## 2017-01-05 ENCOUNTER — Other Ambulatory Visit: Payer: Self-pay | Admitting: Family Medicine

## 2017-02-03 ENCOUNTER — Other Ambulatory Visit: Payer: Self-pay

## 2017-02-03 ENCOUNTER — Other Ambulatory Visit: Payer: Self-pay | Admitting: Family Medicine

## 2017-02-03 MED ORDER — AMLODIPINE BESYLATE 10 MG PO TABS: 5.0000 mg | ORAL_TABLET | Freq: Two times a day (BID) | ORAL | 3 refills | Status: DC

## 2017-02-03 MED ORDER — QUINAPRIL-HYDROCHLOROTHIAZIDE 20-12.5 MG PO TABS: 1.0000 | ORAL_TABLET | Freq: Every day | ORAL | 3 refills | Status: DC

## 2017-02-03 MED ORDER — SIMVASTATIN 20 MG PO TABS: 20.0000 mg | ORAL_TABLET | Freq: Every day | ORAL | 1 refills | Status: DC

## 2017-02-03 MED ORDER — TAMSULOSIN HCL 0.4 MG PO CAPS: 0.4000 mg | ORAL_CAPSULE | Freq: Every day | ORAL | 1 refills | Status: DC

## 2017-02-03 MED ORDER — METOPROLOL SUCCINATE ER 100 MG PO TB24: 100.0000 mg | ORAL_TABLET | Freq: Every day | ORAL | 1 refills | Status: DC

## 2017-02-03 MED ORDER — OMEPRAZOLE 20 MG PO CPDR: 20.0000 mg | DELAYED_RELEASE_CAPSULE | Freq: Every day | ORAL | 1 refills | Status: DC

## 2017-04-07 ENCOUNTER — Encounter: Payer: Self-pay | Admitting: Family Medicine

## 2017-04-07 ENCOUNTER — Ambulatory Visit (INDEPENDENT_AMBULATORY_CARE_PROVIDER_SITE_OTHER): Payer: 59 | Admitting: Family Medicine

## 2017-04-07 VITALS — BP 122/70 | HR 58 | Temp 97.9°F | Resp 18 | Ht 72.0 in | Wt 190.8 lb

## 2017-04-07 DIAGNOSIS — Z0000 Encounter for general adult medical examination without abnormal findings: Secondary | ICD-10-CM

## 2017-04-07 DIAGNOSIS — E782 Mixed hyperlipidemia: Secondary | ICD-10-CM | POA: Diagnosis not present

## 2017-04-07 DIAGNOSIS — R739 Hyperglycemia, unspecified: Secondary | ICD-10-CM

## 2017-04-07 DIAGNOSIS — M67431 Ganglion, right wrist: Secondary | ICD-10-CM

## 2017-04-07 DIAGNOSIS — R600 Localized edema: Secondary | ICD-10-CM

## 2017-04-07 DIAGNOSIS — K219 Gastro-esophageal reflux disease without esophagitis: Secondary | ICD-10-CM | POA: Diagnosis not present

## 2017-04-07 DIAGNOSIS — I10 Essential (primary) hypertension: Secondary | ICD-10-CM | POA: Diagnosis not present

## 2017-04-07 NOTE — Assessment & Plan Note (Signed)
Well controlled, no changes to meds. Encouraged heart healthy diet such as the DASH diet and exercise as tolerated.  °

## 2017-04-07 NOTE — Assessment & Plan Note (Signed)
Tolerating statin, encouraged heart healthy diet, avoid trans fats, minimize simple carbs and saturated fats. Increase exercise as tolerated 

## 2017-04-07 NOTE — Assessment & Plan Note (Signed)
1/2 of Omeprazole tab qod with good results with anything less symptoms start to flare

## 2017-04-07 NOTE — Patient Instructions (Signed)
Massage with Lidocaine gel daily, Aspercreme, Icy Hot and Gale Journey Pas make it Preventive Care 40-64 Years, Male Preventive care refers to lifestyle choices and visits with your health care provider that can promote health and wellness. What does preventive care include?  A yearly physical exam. This is also called an annual well check.  Dental exams once or twice a year.  Routine eye exams. Ask your health care provider how often you should have your eyes checked.  Personal lifestyle choices, including: ? Daily care of your teeth and gums. ? Regular physical activity. ? Eating a healthy diet. ? Avoiding tobacco and drug use. ? Limiting alcohol use. ? Practicing safe sex. ? Taking low-dose aspirin every day starting at age 65. What happens during an annual well check? The services and screenings done by your health care provider during your annual well check will depend on your age, overall health, lifestyle risk factors, and family history of disease. Counseling Your health care provider may ask you questions about your:  Alcohol use.  Tobacco use.  Drug use.  Emotional well-being.  Home and relationship well-being.  Sexual activity.  Eating habits.  Work and work Statistician.  Screening You may have the following tests or measurements:  Height, weight, and BMI.  Blood pressure.  Lipid and cholesterol levels. These may be checked every 5 years, or more frequently if you are over 64 years old.  Skin check.  Lung cancer screening. You may have this screening every year starting at age 75 if you have a 30-pack-year history of smoking and currently smoke or have quit within the past 15 years.  Fecal occult blood test (FOBT) of the stool. You may have this test every year starting at age 96.  Flexible sigmoidoscopy or colonoscopy. You may have a sigmoidoscopy every 5 years or a colonoscopy every 10 years starting at age 71.  Prostate cancer screening.  Recommendations will vary depending on your family history and other risks.  Hepatitis C blood test.  Hepatitis B blood test.  Sexually transmitted disease (STD) testing.  Diabetes screening. This is done by checking your blood sugar (glucose) after you have not eaten for a while (fasting). You may have this done every 1-3 years.  Discuss your test results, treatment options, and if necessary, the need for more tests with your health care provider. Vaccines Your health care provider may recommend certain vaccines, such as:  Influenza vaccine. This is recommended every year.  Tetanus, diphtheria, and acellular pertussis (Tdap, Td) vaccine. You may need a Td booster every 10 years.  Varicella vaccine. You may need this if you have not been vaccinated.  Zoster vaccine. You may need this after age 71.  Measles, mumps, and rubella (MMR) vaccine. You may need at least one dose of MMR if you were born in 1957 or later. You may also need a second dose.  Pneumococcal 13-valent conjugate (PCV13) vaccine. You may need this if you have certain conditions and have not been vaccinated.  Pneumococcal polysaccharide (PPSV23) vaccine. You may need one or two doses if you smoke cigarettes or if you have certain conditions.  Meningococcal vaccine. You may need this if you have certain conditions.  Hepatitis A vaccine. You may need this if you have certain conditions or if you travel or work in places where you may be exposed to hepatitis A.  Hepatitis B vaccine. You may need this if you have certain conditions or if you travel or work in places where you  may be exposed to hepatitis B.  Haemophilus influenzae type b (Hib) vaccine. You may need this if you have certain risk factors.  Talk to your health care provider about which screenings and vaccines you need and how often you need them. This information is not intended to replace advice given to you by your health care provider. Make sure you  discuss any questions you have with your health care provider. Document Released: 10/26/2015 Document Revised: 06/18/2016 Document Reviewed: 07/31/2015 Elsevier Interactive Patient Education  2017 Else[employer] Inc.   Ganglion Cyst A ganglion cyst is a noncancerous, fluid-filled lump that occurs near joints or tendons. The ganglion cyst grows out of a joint or the lining of a tendon. It most often develops in the hand or wrist, but it can also develop in the shoulder, elbow, hip, knee, ankle, or foot. The round or oval ganglion cyst can be the size of a pea or larger than a grape. Increased activity may enlarge the size of the cyst because more fluid starts to build up. What are the causes? It is not known what causes a ganglion cyst to grow. However, it may be related to:  Inflammation or irritation around the joint.  An injury.  Repetitive movements or overuse.  Arthritis.  What increases the risk? Risk factors include:  Being a woman.  Being age 10-50.  What are the signs or symptoms? Symptoms may include:  A lump. This most often appears on the hand or wrist, but it can occur in other areas of the body.  Tingling.  Pain.  Numbness.  Muscle weakness.  Weak grip.  Less movement in a joint.  How is this diagnosed? Ganglion cysts are most often diagnosed based on a physical exam. Your health care provider will feel the lump and may shine a light alongside it. If it is a ganglion cyst, a light often shines through it. Your health care provider may order an X-ray, ultrasound, or MRI to rule out other conditions. How is this treated? Ganglion cysts usually go away on their own without treatment. If pain or other symptoms are involved, treatment may be needed. Treatment is also needed if the ganglion cyst limits your movement or if it gets infected. Treatment may include:  Wearing a brace or splint on your wrist or finger.  Taking anti-inflammatory medicine.  Draining fluid  from the lump with a needle (aspiration).  Injecting a steroid into the joint.  Surgery to remove the ganglion cyst.  Follow these instructions at home:  Do not press on the ganglion cyst, poke it with a needle, or hit it.  Take medicines only as directed by your health care provider.  Wear your brace or splint as directed by your health care provider.  Watch your ganglion cyst for any changes.  Keep all follow-up visits as directed by your health care provider. This is important. Contact a health care provider if:  Your ganglion cyst becomes larger or more painful.  You have increased redness, red streaks, or swelling.  You have pus coming from the lump.  You have weakness or numbness in the affected area.  You have a fever or chills. This information is not intended to replace advice given to you by your health care provider. Make sure you discuss any questions you have with your health care provider. Document Released: 09/26/2000 Document Revised: 03/06/2016 Document Reviewed: 03/14/2014 Elsevier Interactive Patient Education  2018 [employer].

## 2017-04-07 NOTE — Assessment & Plan Note (Signed)
Patient encouraged to maintain heart healthy diet, regular exercise, adequate sleep. Consider daily probiotics. Take medications as prescribed. Given and reviewed copy of ACP documents from [employer] and encouraged to complete and return. Colonoscopy due in 2021

## 2017-04-07 NOTE — Assessment & Plan Note (Signed)
hgba1c acceptable, minimize simple carbs. Increase exercise as tolerated.  

## 2017-04-07 NOTE — Progress Notes (Signed)
Subjective:  I acted as a Education administrator for Dr. Charlett Blake. [place], [place]  Patient ID: William Phillips, male    DOB: 12-30-1950, 66 y.o.   MRN: [id number]  Chief Complaint  Patient presents with  . Annual Exam    HPI  Patient is in today for an annual exam. Patient c/o a lump on his right wrist. He denies pain or injury. Also c/o both feet swelling at times. No recent febrile illness or acute hospitalizations. Denies CP/palp/SOB/HA/congestion/fevers/GI or GU c/o. Taking meds as prescribed. He is noting the lesion on his posterior wrist is stable and has been present for many months, is not painful. He notes the swelling in his feet and ankles is b/l and mild. They improve overnight and worsen when he is on his feet for a long period of time. He is doing well with ADLs and denies any other new concerns.    Patient Care Team: Mosie Lukes, MD as PCP - General (Family Medicine)   Past Medical History:  Diagnosis Date  . Allergic rhinitis   . Ankle fracture 1998  . Atrial flutter ([place])    a. s/p CTI ablation by Dr Rayann Heman 11/2014  . Back pain 08/05/2015  . BPH (benign prostatic hyperplasia) 08/26/2015  . Ganglion cyst of dorsum of right wrist 04/08/2017  . GERD (gastroesophageal reflux disease)   . Hammer toe of right foot 05/06/2013  . History of carpal tunnel syndrome   . Hyperlipidemia   . Hypertension   . Insect bite 04/23/2015  . OA (osteoarthritis) 02/06/2013   knees  . Pedal edema 04/08/2017    Past Surgical History:  Procedure Laterality Date  . ABLATION  11-24-14   CTI ablation by Dr Rayann Heman  . ANKLE SURGERY    . ATRIAL FLUTTER ABLATION N/A 11/24/2014   Procedure: ATRIAL FLUTTER ABLATION;  Surgeon: Thompson Grayer, MD;  Location: [facility] CATH LAB;  Service: Cardiovascular;  Laterality: N/A;  . Carpel tunnel    . COLONOSCOPY  08/04/2002   Normal Exam- Dr Henrene Pastor  . INGUINAL HERNIA REPAIR  2008  . TONSILLECTOMY      Family History  Problem Relation Age of Onset  . Coronary artery  disease Father        aortic valve disease  . Heart attack Father 20  . COPD Father   . Hypertension Father   . Hyperlipidemia Father   . Dementia Father   . Diabetes type II Maternal Grandfather   . Arthritis Mother   . Stroke Mother   . Diabetes Sister   . Diabetes Brother   . Hyperlipidemia Unknown   . Hypertension Unknown     Social History   Social History  . Marital status: Married    Spouse name: N/A  . Number of children: 1  . Years of education: N/A   Occupational History  .      Sales   Social History Main Topics  . Smoking status: Former Research scientist (life sciences)  . Smokeless tobacco: Never Used     Comment: 5-10 pack year history  . Alcohol use Yes     Comment: 2-3 glasses of wine  . Drug use: No  . Sexual activity: Yes   Other Topics Concern  . 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 amLODipine (NORVASC) 10 MG tablet Take 0.5 tablets (5 mg total) by mouth 2 (two) times daily. 90 tablet 3  . aspirin 81 MG tablet Take 81 mg by mouth daily.    . Flaxseed, Linseed, (FLAX SEED OIL) 1000 MG CAPS Take 1 capsule by mouth daily.    . metoprolol succinate (TOPROL-XL) 100 MG 24 hr tablet Take 1 tablet (100 mg total) by mouth daily. Take with or immediately following a meal. 90 tablet 1  . Multiple Vitamin (MULTIVITAMIN) tablet Take 1 tablet by mouth daily.      Marland Kitchen omeprazole (PRILOSEC) 20 MG capsule Take 1 capsule (20 mg total) by mouth daily. 90 capsule 1  . quinapril-hydrochlorothiazide (ACCURETIC) 20-12.5 MG tablet Take 1 tablet by mouth daily. 90 tablet 3  . sildenafil (REVATIO) 20 MG tablet Please take 1-4 tablets by mouth as needed for ED 50 tablet 1  . simvastatin (ZOCOR) 20 MG  tablet Take 1 tablet (20 mg total) by mouth at bedtime. 90 tablet 1  . tamsulosin (FLOMAX) 0.4 MG CAPS capsule Take 0.4 mg by mouth every other day.    . tamsulosin (FLOMAX) 0.4 MG CAPS capsule Take 1 capsule (0.4 mg total) by mouth daily. 90 capsule 1   No facility-administered medications prior to visit.     Allergies  Allergen Reactions  . Penicillins     REACTION: swelling , irriation    Review of Systems  Constitutional: Negative for fever and malaise/fatigue.  HENT: Negative for congestion.   Eyes: Negative for blurred vision.  Respiratory: Negative for cough and shortness of breath.   Cardiovascular: Positive for leg swelling. Negative for chest pain and palpitations.  Gastrointestinal: Negative for vomiting.  Musculoskeletal: Negative for back pain.  Skin: Negative for rash.  Neurological: Negative for loss of consciousness and headaches.       Objective:    Physical Exam  Constitutional: He is oriented to person, place, and time. He appears well-developed and well-nourished. No distress.  HENT:  Head: Normocephalic and atraumatic.  Eyes: Conjunctivae are normal.  Neck: Normal range of motion. No thyromegaly present.  Cardiovascular: Normal rate and regular rhythm.   Pulmonary/Chest: Effort normal and breath sounds normal. He has no wheezes.  Abdominal: Soft. Bowel sounds are normal. There is no tenderness.  Musculoskeletal: Normal range of motion. He exhibits no edema or deformity.  Neurological: He is alert and oriented to person, place, and time.  Skin: Skin is warm and dry. He is not diaphoretic.  Psychiatric: He has a normal mood and affect.    BP 122/70 (BP Location: Left Arm, Patient Position: Sitting, Cuff Size: Normal)   Pulse (!) 58   Temp 97.9 F (36.6 C) (Oral)   Resp 18   Ht 6' (1.829 m)   Wt 190 lb 12.8 oz (86.5 kg)   SpO2 98%   BMI 25.88 kg/m  Wt Readings from Last 3 Encounters:  04/07/17 190 lb 12.8 oz (86.5 kg)  09/22/16 194 lb 12.8 oz  (88.4 kg)  05/26/16 193 lb 8 oz (87.8 kg)   BP Readings from Last 3 Encounters:  04/07/17 122/70  09/22/16 (!) 160/84  05/26/16 132/86     Immunization History  Administered Date(s) Administered  . Influenza Whole 07/25/2009  . Influenza,inj,Quad PF,36+ Mos 07/24/2015, 07/18/2016  . Pneumococcal Conjugate-13 11/02/2013  . Td 01/07/2005  . Tdap 08/24/2015  .  Zoster 02/04/2013    Health Maintenance  Topic Date Due  . INFLUENZA VACCINE  05/13/2017  . COLONOSCOPY  08/22/2020  . TETANUS/TDAP  08/23/2025  . Hepatitis C Screening  Completed  . HIV Screening  Completed    Lab Results  Component Value Date   WBC 8.0 04/07/2017   HGB 15.0 04/07/2017   HCT 44.3 04/07/2017   PLT 206.0 04/07/2017   GLUCOSE 101 (H) 04/07/2017   CHOL 182 04/07/2017   TRIG 81.0 04/07/2017   HDL 76.70 04/07/2017   LDLCALC 89 04/07/2017   ALT 20 04/07/2017   AST 20 04/07/2017   NA 137 04/07/2017   K 3.5 04/07/2017   CL 98 04/07/2017   CREATININE 0.87 04/07/2017   BUN 11 04/07/2017   CO2 30 04/07/2017   TSH 1.74 04/07/2017   PSA 1.63 10/19/2015   HGBA1C 5.7 04/07/2017   MICROALBUR 0.7 10/19/2015    Lab Results  Component Value Date   TSH 1.74 04/07/2017   Lab Results  Component Value Date   WBC 8.0 04/07/2017   HGB 15.0 04/07/2017   HCT 44.3 04/07/2017   MCV 97.7 04/07/2017   PLT 206.0 04/07/2017   Lab Results  Component Value Date   NA 137 04/07/2017   K 3.5 04/07/2017   CO2 30 04/07/2017   GLUCOSE 101 (H) 04/07/2017   BUN 11 04/07/2017   CREATININE 0.87 04/07/2017   BILITOT 0.9 04/07/2017   ALKPHOS 41 04/07/2017   AST 20 04/07/2017   ALT 20 04/07/2017   PROT 6.9 04/07/2017   ALBUMIN 4.4 04/07/2017   CALCIUM 9.7 04/07/2017   ANIONGAP 15 09/25/2014   GFR 93.60 04/07/2017   Lab Results  Component Value Date   CHOL 182 04/07/2017   Lab Results  Component Value Date   HDL 76.70 04/07/2017   Lab Results  Component Value Date   LDLCALC 89 04/07/2017   Lab Results    Component Value Date   TRIG 81.0 04/07/2017   Lab Results  Component Value Date   CHOLHDL 2 04/07/2017   Lab Results  Component Value Date   HGBA1C 5.7 04/07/2017         Assessment & Plan:   Problem List Items Addressed This Visit    Hyperlipidemia, mixed - Primary    Tolerating statin, encouraged heart healthy diet, avoid trans fats, minimize simple carbs and saturated fats. Increase exercise as tolerated      Relevant Orders   Lipid panel (Completed)   Essential hypertension    Well controlled, no changes to meds. Encouraged heart healthy diet such as the DASH diet and exercise as tolerated.       Relevant Orders   CBC (Completed)   Comprehensive metabolic panel (Completed)   TSH (Completed)   GERD    1/2 of Omeprazole tab qod with good results with anything less symptoms start to flare      Preventative health care    Patient encouraged to maintain heart healthy diet, regular exercise, adequate sleep. Consider daily probiotics. Take medications as prescribed. Given and reviewed copy of ACP documents from [employer] and encouraged to complete and return. Colonoscopy due in 2021      Hyperglycemia    hgba1c acceptable, minimize simple carbs. Increase exercise as tolerated.      Relevant Orders   Hemoglobin A1c (Completed)   Ganglion cyst of dorsum of right wrist    Asymptomatic, encouraged to try massage with Aspercreme or lidocaine daily and report  if it becomes symptomatic or grows quickly so we can refer      Pedal edema    Mild, discussed need to minimize sodium, elevate feet above heart as needed and use compression socks, report if becomes more notable         I am having Mr. Friedel maintain his multivitamin, aspirin, sildenafil, tamsulosin, Flax Seed Oil, amLODipine, simvastatin, quinapril-hydrochlorothiazide, metoprolol succinate, and omeprazole.  No orders of the defined types were placed in this encounter.   CMA served as Education administrator  during this visit. History, Physical and Plan performed by medical provider. Documentation and orders reviewed and attested to.  Penni Homans, MD

## 2017-04-08 ENCOUNTER — Encounter: Payer: Self-pay | Admitting: Family Medicine

## 2017-04-08 DIAGNOSIS — R600 Localized edema: Secondary | ICD-10-CM

## 2017-04-08 DIAGNOSIS — M67431 Ganglion, right wrist: Secondary | ICD-10-CM | POA: Insufficient documentation

## 2017-04-08 HISTORY — DX: Localized edema: R60.0

## 2017-04-08 HISTORY — DX: Ganglion, right wrist: M67.431

## 2017-04-08 LAB — CBC
HCT: 44.3 % (ref 39.0–52.0)
HEMOGLOBIN: 15 g/dL (ref 13.0–17.0)
MCHC: 33.8 g/dL (ref 30.0–36.0)
MCV: 97.7 fl (ref 78.0–100.0)
PLATELETS: 206 10*3/uL (ref 150.0–400.0)
RBC: 4.53 Mil/uL (ref 4.22–5.81)
RDW: 13.3 % (ref 11.5–15.5)
WBC: 8 10*3/uL (ref 4.0–10.5)

## 2017-04-08 LAB — COMPREHENSIVE METABOLIC PANEL
ALK PHOS: 41 U/L (ref 39–117)
ALT: 20 U/L (ref 0–53)
AST: 20 U/L (ref 0–37)
Albumin: 4.4 g/dL (ref 3.5–5.2)
BILIRUBIN TOTAL: 0.9 mg/dL (ref 0.2–1.2)
BUN: 11 mg/dL (ref 6–23)
CO2: 30 mEq/L (ref 19–32)
CREATININE: 0.87 mg/dL (ref 0.40–1.50)
Calcium: 9.7 mg/dL (ref 8.4–10.5)
Chloride: 98 mEq/L (ref 96–112)
GFR: 93.6 mL/min (ref >60.00)
GLUCOSE: 101 mg/dL — AB (ref 70–99)
Potassium: 3.5 mEq/L (ref 3.5–5.1)
Sodium: 137 mEq/L (ref 135–145)
TOTAL PROTEIN: 6.9 g/dL (ref 6.0–8.3)

## 2017-04-08 LAB — TSH: TSH: 1.74 u[IU]/mL (ref 0.35–4.50)

## 2017-04-08 LAB — LIPID PANEL
Cholesterol: 182 mg/dL (ref 0–200)
HDL: 76.7 mg/dL (ref >39.00)
LDL Cholesterol: 89 mg/dL (ref 0–99)
NONHDL: 105.36
Total CHOL/HDL Ratio: 2
Triglycerides: 81 mg/dL (ref 0.0–149.0)
VLDL: 16.2 mg/dL (ref 0.0–40.0)

## 2017-04-08 LAB — HEMOGLOBIN A1C: HEMOGLOBIN A1C: 5.7 % (ref 4.6–6.5)

## 2017-04-08 NOTE — Assessment & Plan Note (Signed)
Asymptomatic, encouraged to try massage with Aspercreme or lidocaine daily and report if it becomes symptomatic or grows quickly so we can refer

## 2017-04-08 NOTE — Assessment & Plan Note (Signed)
Mild, discussed need to minimize sodium, elevate feet above heart as needed and use compression socks, report if becomes more notable

## 2017-06-24 DIAGNOSIS — M1712 Unilateral primary osteoarthritis, left knee: Secondary | ICD-10-CM | POA: Diagnosis not present

## 2017-07-08 DIAGNOSIS — M1712 Unilateral primary osteoarthritis, left knee: Secondary | ICD-10-CM | POA: Diagnosis not present

## 2017-07-15 ENCOUNTER — Telehealth: Payer: Self-pay | Admitting: Family Medicine

## 2017-07-15 DIAGNOSIS — M1712 Unilateral primary osteoarthritis, left knee: Secondary | ICD-10-CM | POA: Diagnosis not present

## 2017-07-15 NOTE — Telephone Encounter (Signed)
[facility] is going to be sending a fax requesting refills for the patients 6 Rx.  The patient just switched insurance

## 2017-07-17 ENCOUNTER — Other Ambulatory Visit: Payer: Self-pay

## 2017-07-17 MED ORDER — OMEPRAZOLE 20 MG PO CPDR: 20.0000 mg | DELAYED_RELEASE_CAPSULE | Freq: Every day | ORAL | 1 refills | Status: DC

## 2017-07-17 MED ORDER — SIMVASTATIN 20 MG PO TABS: 20.0000 mg | ORAL_TABLET | Freq: Every day | ORAL | 1 refills | Status: DC

## 2017-07-17 MED ORDER — METOPROLOL SUCCINATE ER 100 MG PO TB24: 100.0000 mg | ORAL_TABLET | Freq: Every day | ORAL | 1 refills | Status: DC

## 2017-07-17 MED ORDER — QUINAPRIL-HYDROCHLOROTHIAZIDE 20-12.5 MG PO TABS: 1.0000 | ORAL_TABLET | Freq: Every day | ORAL | 3 refills | Status: DC

## 2017-07-17 MED ORDER — AMLODIPINE BESYLATE 10 MG PO TABS: 5.0000 mg | ORAL_TABLET | Freq: Two times a day (BID) | ORAL | 3 refills | Status: DC

## 2017-07-17 MED ORDER — TAMSULOSIN HCL 0.4 MG PO CAPS: 0.4000 mg | ORAL_CAPSULE | ORAL | 0 refills | Status: DC

## 2017-08-14 ENCOUNTER — Other Ambulatory Visit: Payer: Self-pay | Admitting: Family Medicine

## 2017-09-11 ENCOUNTER — Other Ambulatory Visit: Payer: Self-pay

## 2017-09-11 MED ORDER — SILDENAFIL CITRATE 20 MG PO TABS: ORAL_TABLET | ORAL | 1 refills | Status: DC

## 2017-10-09 ENCOUNTER — Telehealth: Payer: Self-pay

## 2017-10-09 ENCOUNTER — Encounter: Payer: Self-pay | Admitting: Family Medicine

## 2017-10-09 NOTE — Telephone Encounter (Signed)
PA initiated via Covermymeds; KEY: [id number]. Awaiting determination.

## 2017-10-12 NOTE — Telephone Encounter (Signed)
PA denied. Treatment of ED medications are excluded from Part D prescription drug coverage under Medicare rules.

## 2017-11-04 ENCOUNTER — Encounter: Payer: Self-pay | Admitting: Family Medicine

## 2017-11-05 ENCOUNTER — Ambulatory Visit: Payer: Medicare Other | Admitting: Family Medicine

## 2017-11-05 ENCOUNTER — Encounter: Payer: Self-pay | Admitting: Family Medicine

## 2017-11-05 VITALS — BP 121/66 | HR 61 | Temp 98.0°F | Resp 16 | Ht 74.0 in | Wt 199.0 lb

## 2017-11-05 DIAGNOSIS — R351 Nocturia: Secondary | ICD-10-CM | POA: Diagnosis not present

## 2017-11-05 DIAGNOSIS — E782 Mixed hyperlipidemia: Secondary | ICD-10-CM | POA: Diagnosis not present

## 2017-11-05 DIAGNOSIS — Z0000 Encounter for general adult medical examination without abnormal findings: Secondary | ICD-10-CM

## 2017-11-05 DIAGNOSIS — R739 Hyperglycemia, unspecified: Secondary | ICD-10-CM

## 2017-11-05 DIAGNOSIS — I10 Essential (primary) hypertension: Secondary | ICD-10-CM | POA: Diagnosis not present

## 2017-11-05 DIAGNOSIS — L989 Disorder of the skin and subcutaneous tissue, unspecified: Secondary | ICD-10-CM | POA: Insufficient documentation

## 2017-11-05 DIAGNOSIS — I4892 Unspecified atrial flutter: Secondary | ICD-10-CM | POA: Diagnosis not present

## 2017-11-05 DIAGNOSIS — Z23 Encounter for immunization: Secondary | ICD-10-CM | POA: Diagnosis not present

## 2017-11-05 LAB — COMPREHENSIVE METABOLIC PANEL
ALBUMIN: 4.4 g/dL (ref 3.5–5.2)
ALK PHOS: 45 U/L (ref 39–117)
ALT: 19 U/L (ref 0–53)
AST: 20 U/L (ref 0–37)
BUN: 13 mg/dL (ref 6–23)
CO2: 30 mEq/L (ref 19–32)
CREATININE: 0.84 mg/dL (ref 0.40–1.50)
Calcium: 9.2 mg/dL (ref 8.4–10.5)
Chloride: 100 mEq/L (ref 96–112)
GFR: 97.3 mL/min (ref >60.00)
GLUCOSE: 125 mg/dL — AB (ref 70–99)
Potassium: 4.1 mEq/L (ref 3.5–5.1)
Sodium: 137 mEq/L (ref 135–145)
TOTAL PROTEIN: 7.3 g/dL (ref 6.0–8.3)
Total Bilirubin: 0.8 mg/dL (ref 0.2–1.2)

## 2017-11-05 LAB — URINALYSIS, ROUTINE W REFLEX MICROSCOPIC
Bilirubin Urine: NEGATIVE
Hgb urine dipstick: NEGATIVE
KETONES UR: NEGATIVE
NITRITE: NEGATIVE
RBC / HPF: NONE SEEN (ref 0–?)
SPECIFIC GRAVITY, URINE: 1.015 (ref 1.000–1.030)
TOTAL PROTEIN, URINE-UPE24: NEGATIVE
URINE GLUCOSE: NEGATIVE
Urobilinogen, UA: 0.2 (ref 0.0–1.0)
pH: 7.5 (ref 5.0–8.0)

## 2017-11-05 LAB — CBC
HEMATOCRIT: 44.7 % (ref 39.0–52.0)
Hemoglobin: 15.1 g/dL (ref 13.0–17.0)
MCHC: 33.9 g/dL (ref 30.0–36.0)
MCV: 96.7 fl (ref 78.0–100.0)
Platelets: 202 10*3/uL (ref 150.0–400.0)
RBC: 4.62 Mil/uL (ref 4.22–5.81)
RDW: 13.3 % (ref 11.5–15.5)
WBC: 7.9 10*3/uL (ref 4.0–10.5)

## 2017-11-05 LAB — LIPID PANEL
CHOLESTEROL: 164 mg/dL (ref 0–200)
HDL: 76.2 mg/dL (ref >39.00)
LDL CALC: 68 mg/dL (ref 0–99)
NONHDL: 87.95
Total CHOL/HDL Ratio: 2
Triglycerides: 98 mg/dL (ref 0.0–149.0)
VLDL: 19.6 mg/dL (ref 0.0–40.0)

## 2017-11-05 LAB — HEMOGLOBIN A1C: Hgb A1c MFr Bld: 6 % (ref 4.6–6.5)

## 2017-11-05 LAB — TSH: TSH: 0.83 u[IU]/mL (ref 0.35–4.50)

## 2017-11-05 MED ORDER — TAMSULOSIN HCL 0.4 MG PO CAPS: 0.4000 mg | ORAL_CAPSULE | Freq: Every day | ORAL | 1 refills | Status: DC

## 2017-11-05 MED ORDER — SILDENAFIL CITRATE 20 MG PO TABS: 20.0000 mg | ORAL_TABLET | Freq: Every day | ORAL | 1 refills | Status: DC | PRN

## 2017-11-05 NOTE — Assessment & Plan Note (Signed)
Well controlled, no changes to meds. Encouraged heart healthy diet such as the DASH diet and exercise as tolerated.  °

## 2017-11-05 NOTE — Assessment & Plan Note (Signed)
Encouraged heart healthy diet, increase exercise, avoid trans fats, consider a krill oil cap daily. Tolerating Simvastatin.  

## 2017-11-05 NOTE — Patient Instructions (Signed)
Preventive Care 40-64 Years, Male Preventive care refers to lifestyle choices and visits with your health care provider that can promote health and wellness. What does preventive care include?  A yearly physical exam. This is also called an annual well check.  Dental exams once or twice a year.  Routine eye exams. Ask your health care provider how often you should have your eyes checked.  Personal lifestyle choices, including: ? Daily care of your teeth and gums. ? Regular physical activity. ? Eating a healthy diet. ? Avoiding tobacco and drug use. ? Limiting alcohol use. ? Practicing safe sex. ? Taking low-dose aspirin every day starting at age 65. What happens during an annual well check? The services and screenings done by your health care provider during your annual well check will depend on your age, overall health, lifestyle risk factors, and family history of disease. Counseling Your health care provider may ask you questions about your:  Alcohol use.  Tobacco use.  Drug use.  Emotional well-being.  Home and relationship well-being.  Sexual activity.  Eating habits.  Work and work Statistician.  Screening You may have the following tests or measurements:  Height, weight, and BMI.  Blood pressure.  Lipid and cholesterol levels. These may be checked every 5 years, or more frequently if you are over 79 years old.  Skin check.  Lung cancer screening. You may have this screening every year starting at age 66 if you have a 30-pack-year history of smoking and currently smoke or have quit within the past 15 years.  Fecal occult blood test (FOBT) of the stool. You may have this test every year starting at age 39.  Flexible sigmoidoscopy or colonoscopy. You may have a sigmoidoscopy every 5 years or a colonoscopy every 10 years starting at age 56.  Prostate cancer screening. Recommendations will vary depending on your family history and other risks.  Hepatitis C  blood test.  Hepatitis B blood test.  Sexually transmitted disease (STD) testing.  Diabetes screening. This is done by checking your blood sugar (glucose) after you have not eaten for a while (fasting). You may have this done every 1-3 years.  Discuss your test results, treatment options, and if necessary, the need for more tests with your health care provider. Vaccines Your health care provider may recommend certain vaccines, such as:  Influenza vaccine. This is recommended every year.  Tetanus, diphtheria, and acellular pertussis (Tdap, Td) vaccine. You may need a Td booster every 10 years.  Varicella vaccine. You may need this if you have not been vaccinated.  Zoster vaccine. You may need this after age 20.  Measles, mumps, and rubella (MMR) vaccine. You may need at least one dose of MMR if you were born in 1957 or later. You may also need a second dose.  Pneumococcal 13-valent conjugate (PCV13) vaccine. You may need this if you have certain conditions and have not been vaccinated.  Pneumococcal polysaccharide (PPSV23) vaccine. You may need one or two doses if you smoke cigarettes or if you have certain conditions.  Meningococcal vaccine. You may need this if you have certain conditions.  Hepatitis A vaccine. You may need this if you have certain conditions or if you travel or work in places where you may be exposed to hepatitis A.  Hepatitis B vaccine. You may need this if you have certain conditions or if you travel or work in places where you may be exposed to hepatitis B.  Haemophilus influenzae type b (Hib) vaccine.  You may need this if you have certain risk factors.  Talk to your health care provider about which screenings and vaccines you need and how often you need them. This information is not intended to replace advice given to you by your health care provider. Make sure you discuss any questions you have with your health care provider. Document Released: 10/26/2015  Document Revised: 06/18/2016 Document Reviewed: 07/31/2015 Elsevier Interactive Patient Education  [employer].

## 2017-11-05 NOTE — Assessment & Plan Note (Addendum)
Patient denies any difficulties at home. No trouble with ADLs, depression or falls. See EMR for functional status screen and depression screen. No recent changes to vision or hearing. Is UTD with immunizations. Is UTD with screening. Discussed Advanced Directives. Encouraged heart healthy diet, exercise as tolerated and adequate sleep. See patient's problem list for health risk factors to monitor. See AVS for preventative healthcare recommendation schedule. Encouraged shingrix Sees dr Claudean Kinds of opthamology Last colonoscopy in 2011 normal, repeat in 10 years. Labs ordered today

## 2017-11-05 NOTE — Assessment & Plan Note (Signed)
No episodes in past couple of years. Released from cardiology care a couple of years ago. Check an EKG today

## 2017-11-05 NOTE — Assessment & Plan Note (Signed)
hgba1c acceptable, minimize simple carbs. Increase exercise as tolerated.  

## 2017-11-05 NOTE — Progress Notes (Signed)
Subjective:  I acted as a Education administrator for Dr. Charlett Blake. [place], [place]  Patient ID: William Phillips, male    DOB: 1951-08-31, 67 y.o.   MRN: [id number]  Chief Complaint  Patient presents with  . Medicare Wellness Visit    HPI  Patient is in today for a welcome to medicare visit and follow up on chronic medical concerns such as ED, hypertension, hyperlipidemia and more. Patient denies any difficulties at home. No trouble with ADLs, depression or falls. See EMR for functional status screen and depression screen. No recent changes to vision or hearing. Is UTD with immunizations. Is UTD with screening. Discussed Advanced Directives.  See patient's problem list for health risk factors to monitor. See AVS for preventative healthcare recommendation schedule.  No recent febrile illness or hospitalizations. Is trying to maintain a heart healthy diet and stay active. Denies CP/palp/SOB/HA/congestion/fevers/GI or GU c/o. Taking meds as prescribed   Patient Care Team: Mosie Lukes, MD as PCP - General (Family Medicine)   Past Medical History:  Diagnosis Date  . Allergic rhinitis   . Ankle fracture 1998  . Atrial flutter ([place])    a. s/p CTI ablation by Dr Rayann Heman 11/2014  . Back pain 08/05/2015  . BPH (benign prostatic hyperplasia) 08/26/2015  . Ganglion cyst of dorsum of right wrist 04/08/2017  . GERD (gastroesophageal reflux disease)   . Hammer toe of right foot 05/06/2013  . History of carpal tunnel syndrome   . Hyperlipidemia   . Hypertension   . Insect bite 04/23/2015  . OA (osteoarthritis) 02/06/2013   knees  . Pedal edema 04/08/2017    Past Surgical History:  Procedure Laterality Date  . ABLATION  11-24-14   CTI ablation by Dr Rayann Heman  . ANKLE SURGERY    . ATRIAL FLUTTER ABLATION N/A 11/24/2014   Procedure: ATRIAL FLUTTER ABLATION;  Surgeon: Thompson Grayer, MD;  Location[facility] CATH LAB;  Service: Cardiovascular;  Laterality: N/A;  . Carpel tunnel    . COLONOSCOPY  08/04/2002   Normal Exam-  Dr Henrene Pastor  . INGUINAL HERNIA REPAIR  2008  . TONSILLECTOMY      Family History  Problem Relation Age of Onset  . Coronary artery disease Father        aortic valve disease  . Heart attack Father 40  . COPD Father   . Hypertension Father   . Hyperlipidemia Father   . Dementia Father   . Diabetes type II Maternal Grandfather   . Arthritis Mother   . Stroke Mother   . Diabetes Sister   . Diabetes Brother   . Hyperlipidemia Unknown   . Hypertension Unknown     Social History   Socioeconomic History  . Marital status: Married    Spouse name: Not on file  . Number of children: 1  . Years of education: Not on file  . Highest education level: Not on file  Social Needs  . Financial resource strain: Not on file  . Food insecurity - worry: Not on file  . Food insecurity - inability: Not on file  . Transportation needs - medical: Not on file  . Transportation needs - non-medical: Not on file  Occupational History    Comment: Sales  Tobacco Use  . Smoking status: Former Research scientist (life sciences)  . Smokeless tobacco: Never Used  . Tobacco comment: 5-10 pack year history  Substance and Sexual Activity  . Alcohol use: Yes    Comment: 2-3 glasses of wine  . Drug use: No  .  Sexual activity: Yes  Other Topics Concern  . 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 amLODipine (NORVASC) 10 MG tablet Take 0.5 tablets (5 mg total) by mouth 2 (two) times daily. 90 tablet 3  . aspirin 81 MG tablet Take 81 mg by mouth daily.    . Flaxseed, Linseed, (FLAX SEED OIL) 1000 MG CAPS Take 1 capsule by mouth daily.    . metoprolol succinate (TOPROL-XL) 100 MG 24 hr tablet Take 1 tablet (100 mg total) by mouth daily. Take  with or immediately following a meal. 90 tablet 1  . Multiple Vitamin (MULTIVITAMIN) tablet Take 1 tablet by mouth daily.      Marland Kitchen omeprazole (PRILOSEC) 20 MG capsule Take 1 capsule (20 mg total) by mouth daily. 90 capsule 1  . quinapril-hydrochlorothiazide (ACCURETIC) 20-12.5 MG tablet Take 1 tablet by mouth daily. 90 tablet 3  . sildenafil (REVATIO) 20 MG tablet Please take 1-4 tablets by mouth as needed for ED 50 tablet 1  . simvastatin (ZOCOR) 20 MG tablet Take 1 tablet (20 mg total) by mouth at bedtime. 90 tablet 1  . tamsulosin (FLOMAX) 0.4 MG CAPS capsule TAKE 1 CAPSULE BY MOUTH  EVERY OTHER DAY 30 capsule 1   No facility-administered medications prior to visit.     Allergies  Allergen Reactions  . Penicillins     REACTION: swelling , irriation    Review of Systems  Constitutional: Negative for fever and malaise/fatigue.  HENT: Negative for congestion.   Eyes: Negative for blurred vision.  Respiratory: Negative for cough and shortness of breath.   Cardiovascular: Negative for chest pain, palpitations and leg swelling.  Gastrointestinal: Negative for vomiting.  Musculoskeletal: Negative for back pain.  Skin: Negative for rash.  Neurological: Negative for loss of consciousness and headaches.       Objective:    Physical Exam  Constitutional: He is oriented to person, place, and time. He appears well-developed and well-nourished. No distress.  HENT:  Head: Normocephalic and atraumatic.  Eyes: Conjunctivae are normal.  Neck: Normal range of motion. No thyromegaly present.  Cardiovascular: Normal rate and regular rhythm.  Pulmonary/Chest: Effort normal and breath sounds normal. He has no wheezes.  Abdominal: Soft. Bowel sounds are normal. There is no tenderness.  Musculoskeletal: Normal range of motion. He exhibits no edema or deformity.  Neurological: He is alert and oriented to person, place, and time.  Skin: Skin is warm and dry. He is not diaphoretic.  Psychiatric: He  has a normal mood and affect.    BP 121/66 (BP Location: Left Arm, Patient Position: Sitting, Cuff Size: Large)   Pulse 61   Temp 98 F (36.7 C) (Oral)   Resp 16   Ht 6\' 2"  (1.88 m)   Wt 199 lb (90.3 kg)   SpO2 99%   BMI 25.55 kg/m  Wt Readings from Last 3 Encounters:  11/05/17 199 lb (90.3 kg)  04/07/17 190 lb 12.8 oz (86.5 kg)  09/22/16 194 lb 12.8 oz (88.4 kg)   BP Readings from Last 3 Encounters:  11/05/17 121/66  04/07/17 122/70  09/22/16 (!) 160/84     Immunization History  Administered Date(s) Administered  . Influenza Whole 07/25/2009  . Influenza, High Dose Seasonal PF 07/29/2017  . Influenza,inj,Quad PF,6+ Mos 07/24/2015, 07/18/2016  . Pneumococcal Conjugate-13 11/02/2013  . Pneumococcal Polysaccharide-23 11/05/2017  . Td 01/07/2005  . Tdap 08/24/2015  . Zoster 02/04/2013    Health Maintenance  Topic Date Due  . COLONOSCOPY  08/22/2020  . TETANUS/TDAP  08/23/2025  . INFLUENZA VACCINE  Completed  . Hepatitis C Screening  Completed  . HIV Screening  Completed  . PNA vac Low Risk Adult  Completed    Lab Results  Component Value Date   WBC 7.9 11/05/2017   HGB 15.1 11/05/2017   HCT 44.7 11/05/2017   PLT 202.0 11/05/2017   GLUCOSE 125 (H) 11/05/2017   CHOL 164 11/05/2017   TRIG 98.0 11/05/2017   HDL 76.20 11/05/2017   LDLCALC 68 11/05/2017   ALT 19 11/05/2017   AST 20 11/05/2017   NA 137 11/05/2017   K 4.1 11/05/2017   CL 100 11/05/2017   CREATININE 0.84 11/05/2017   BUN 13 11/05/2017   CO2 30 11/05/2017   TSH 0.83 11/05/2017   PSA 1.63 10/19/2015   HGBA1C 6.0 11/05/2017   MICROALBUR 0.7 10/19/2015    Lab Results  Component Value Date   TSH 0.83 11/05/2017   Lab Results  Component Value Date   WBC 7.9 11/05/2017   HGB 15.1 11/05/2017   HCT 44.7 11/05/2017   MCV 96.7 11/05/2017   PLT 202.0 11/05/2017   Lab Results  Component Value Date   NA 137 11/05/2017   K 4.1 11/05/2017   CO2 30 11/05/2017   GLUCOSE 125 (H) 11/05/2017    BUN 13 11/05/2017   CREATININE 0.84 11/05/2017   BILITOT 0.8 11/05/2017   ALKPHOS 45 11/05/2017   AST 20 11/05/2017   ALT 19 11/05/2017   PROT 7.3 11/05/2017   ALBUMIN 4.4 11/05/2017   CALCIUM 9.2 11/05/2017   ANIONGAP 15 09/25/2014   GFR 97.30 11/05/2017   Lab Results  Component Value Date   CHOL 164 11/05/2017   Lab Results  Component Value Date   HDL 76.20 11/05/2017   Lab Results  Component Value Date   LDLCALC 68 11/05/2017   Lab Results  Component Value Date   TRIG 98.0 11/05/2017   Lab Results  Component Value Date   CHOLHDL 2 11/05/2017   Lab Results  Component Value Date   HGBA1C 6.0 11/05/2017         Assessment & Plan:   Problem List Items Addressed This Visit    Hyperlipidemia, mixed    Encouraged heart healthy diet, increase exercise, avoid trans fats, consider a krill oil cap daily. Tolerating Simvastatin      Relevant Medications   sildenafil (REVATIO) 20 MG tablet   Other Relevant Orders   Lipid panel (Completed)   EKG 12-Lead (Completed)   Essential hypertension    Well controlled, no changes to meds. Encouraged heart healthy diet such as the DASH diet and exercise as tolerated.       Relevant Medications   sildenafil (REVATIO) 20 MG tablet   Other Relevant Orders   CBC (Completed)   Comprehensive metabolic panel (Completed)   TSH (Completed)   Urinalysis   EKG 12-Lead (Completed)   Welcome to Medicare preventive visit    Patient denies any difficulties at home. No trouble with ADLs, depression or falls. See EMR for functional status screen and depression screen. No recent changes to vision or hearing. Is UTD with  immunizations. Is UTD with screening. Discussed Advanced Directives. Encouraged heart healthy diet, exercise as tolerated and adequate sleep. See patient's problem list for health risk factors to monitor. See AVS for preventative healthcare recommendation schedule. Encouraged shingrix Sees dr Claudean Kinds of opthamology Last  colonoscopy in 2011 normal, repeat in 10 years. Labs ordered today      Relevant Orders   EKG 12-Lead (Completed)   Hyperglycemia    hgba1c acceptable, minimize simple carbs. Increase exercise as tolerated.       Relevant Orders   Hemoglobin A1c (Completed)   EKG 12-Lead (Completed)   Atrial flutter ([facility])    No episodes in past couple of years. Released from cardiology care a couple of years ago. Check an EKG today      Relevant Medications   sildenafil (REVATIO) 20 MG tablet   Other Relevant Orders   EKG 12-Lead (Completed)   Skin lesion of left arm    Notes for several months, hydrocortisone helps a little. Sees Dermatology Dr Tamala Julian he is encouraged to see him for a possible biopsy       Other Visit Diagnoses    Need for pneumococcal vaccination    -  Primary   Relevant Orders   Pneumococcal polysaccharide vaccine 23-valent greater than or equal to 2yo subcutaneous/IM (Completed)      I have changed William Ada "Ken"'s tamsulosin. I am also having him start on sildenafil. Additionally, I am having him maintain his multivitamin, aspirin, Flax Seed Oil, simvastatin, omeprazole, metoprolol succinate, amLODipine, quinapril-hydrochlorothiazide, and sildenafil.  Meds ordered this encounter  Medications  . sildenafil (REVATIO) 20 MG tablet    Sig: Take 1-4 tablets (20-80 mg total) by mouth daily as needed.    Dispense:  90 tablet    Refill:  1  . tamsulosin (FLOMAX) 0.4 MG CAPS capsule    Sig: Take 1 capsule (0.4 mg total) by mouth daily.    Dispense:  90 capsule    Refill:  1    CMA served as scribe during this visit. History, Physical and Plan performed by medical provider. Documentation and orders reviewed and attested to.  Penni Homans, MD

## 2017-11-05 NOTE — Assessment & Plan Note (Signed)
Notes for several months, hydrocortisone helps a little. Sees Dermatology Dr Tamala Julian he is encouraged to see him for a possible biopsy

## 2017-11-08 ENCOUNTER — Encounter: Payer: Self-pay | Admitting: Family Medicine

## 2017-11-30 ENCOUNTER — Other Ambulatory Visit: Payer: Self-pay | Admitting: Family Medicine

## 2017-12-07 DIAGNOSIS — J029 Acute pharyngitis, unspecified: Secondary | ICD-10-CM | POA: Diagnosis not present

## 2017-12-20 ENCOUNTER — Encounter: Payer: Self-pay | Admitting: Family Medicine

## 2017-12-21 ENCOUNTER — Other Ambulatory Visit: Payer: Self-pay | Admitting: Family Medicine

## 2017-12-21 DIAGNOSIS — R739 Hyperglycemia, unspecified: Secondary | ICD-10-CM

## 2017-12-21 DIAGNOSIS — I10 Essential (primary) hypertension: Secondary | ICD-10-CM

## 2017-12-21 DIAGNOSIS — E785 Hyperlipidemia, unspecified: Secondary | ICD-10-CM

## 2017-12-21 DIAGNOSIS — R001 Bradycardia, unspecified: Secondary | ICD-10-CM

## 2018-01-14 NOTE — Progress Notes (Signed)
HPI: FU atrial flutter. Patient seen in the emergency room in December, 2015 with atrial flutter. Follow-up ECG showed he converted to sinus rhythm. Echocardiogram February 2016 showed normal LV function, grade 1 diastolic dysfunction. Had ablation February 2016. Since last seen, the patient denies any dyspnea on exertion, orthopnea, PND, pedal edema, palpitations, syncope or chest pain.   Current Outpatient Medications  Medication Sig Dispense Refill  . amLODipine (NORVASC) 10 MG tablet Take 0.5 tablets (5 mg total) by mouth 2 (two) times daily. 90 tablet 3  . aspirin 81 MG tablet Take 81 mg by mouth daily.    . Flaxseed, Linseed, (FLAX SEED OIL) 1000 MG CAPS Take 1 capsule by mouth daily.    . metoprolol succinate (TOPROL-XL) 100 MG 24 hr tablet TAKE 1 TABLET BY MOUTH  DAILY WITH OR IMMEDIATLEY  FOLLOWING A MEAL 90 tablet 1  . Multiple Vitamin (MULTIVITAMIN) tablet Take 1 tablet by mouth daily.      Marland Kitchen omeprazole (PRILOSEC) 20 MG capsule TAKE 1 CAPSULE BY MOUTH  DAILY 90 capsule 1  . quinapril-hydrochlorothiazide (ACCURETIC) 20-12.5 MG tablet Take 1 tablet by mouth daily. 90 tablet 3  . sildenafil (REVATIO) 20 MG tablet Take 1-4 tablets (20-80 mg total) by mouth daily as needed. 90 tablet 1  . simvastatin (ZOCOR) 20 MG tablet TAKE 1 TABLET BY MOUTH AT  BEDTIME 90 tablet 1  . tamsulosin (FLOMAX) 0.4 MG CAPS capsule Take 1 capsule (0.4 mg total) by mouth daily. 90 capsule 1   No current facility-administered medications for this visit.      Past Medical History:  Diagnosis Date  . Allergic rhinitis   . Ankle fracture 1998  . Atrial flutter ([place])    a. s/p CTI ablation by Dr Rayann Heman 11/2014  . Back pain 08/05/2015  . BPH (benign prostatic hyperplasia) 08/26/2015  . Ganglion cyst of dorsum of right wrist 04/08/2017  . GERD (gastroesophageal reflux disease)   . Hammer toe of right foot 05/06/2013  . History of carpal tunnel syndrome   . Hyperlipidemia   . Hypertension   . Insect  bite 04/23/2015  . OA (osteoarthritis) 02/06/2013   knees  . Pedal edema 04/08/2017    Past Surgical History:  Procedure Laterality Date  . ABLATION  11-24-14   CTI ablation by Dr Rayann Heman  . ANKLE SURGERY    . ATRIAL FLUTTER ABLATION N/A 11/24/2014   Procedure: ATRIAL FLUTTER ABLATION;  Surgeon: Thompson Grayer, MD;  [facility] CATH LAB;  Service: Cardiovascular;  Laterality: N/A;  . Carpel tunnel    . COLONOSCOPY  08/04/2002   Normal Exam- Dr Henrene Pastor  . INGUINAL HERNIA REPAIR  2008  . TONSILLECTOMY      Social History   Socioeconomic History  . Marital status: Married    Spouse name: Not on file  . Number of children: 1  . Years of education: Not on file  . Highest education level: Not on file  Occupational History    Comment: Sales  Social Needs  . Financial resource strain: Not on file  . Food insecurity:    Worry: Not on file    Inability: Not on file  . Transportation needs:    Medical: Not on file    Non-medical: Not on file  Tobacco Use  . Smoking status: Former Research scientist (life sciences)  . Smokeless tobacco: Never Used  . Tobacco comment: 5-10 pack year history  Substance and Sexual Activity  . Alcohol use: Yes  Comment: 2-3 glasses of wine  . Drug use: No  . Sexual activity: Yes  Lifestyle  . Physical activity:    Days per week: Not on file    Minutes per session: Not on file  . Stress: Not on file  Relationships  . Social connections:    Talks on phone: Not on file    Gets together: Not on file    Attends religious service: Not on file    Active member of club or organization: Not on file    Attends meetings of clubs or organizations: Not on file    Relationship status: Not on file  . Intimate partner violence:    Fear of current or ex partner: Not on file    Emotionally abused: Not on file    Physically abused: Not on file    Forced sexual activity: Not on file  Other Topics Concern  . 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 Coronary artery disease Father        aortic valve disease  . Heart attack Father 68  . COPD Father   . Hypertension Father   . Hyperlipidemia Father   . Dementia Father   . Diabetes type II Maternal Grandfather   . Arthritis Mother   . Stroke Mother   . Diabetes Sister   . Diabetes Brother   . Hyperlipidemia Unknown   . Hypertension Unknown     ROS: no fevers or chills, productive cough, hemoptysis, dysphasia, odynophagia, melena, hematochezia, dysuria, hematuria, rash, seizure activity, orthopnea, PND, pedal edema, claudication. Remaining systems are negative.  Physical Exam: Well-developed well-nourished in no acute distress.  Skin is warm and dry.  HEENT is normal.  Neck is supple.  Chest is clear to auscultation with normal expansion.  Cardiovascular exam is regular rate and rhythm.  Abdominal exam nontender or distended. No masses palpated. Positive bruit Extremities show no edema. neuro grossly intact   A/P  1 atrial flutter-status post ablation-patient remains in sinus rhythm on exam.  Continue aspirin and Toprol.  2 hypertension-blood pressure is controlled.  Continue present medications.  3 hyperlipidemia-continue statin.  4 Bruit-schedule ultrasound to exclude aneurysm.  Kirk Ruths, MD

## 2018-01-19 ENCOUNTER — Ambulatory Visit: Payer: Medicare Other | Admitting: Cardiology

## 2018-01-19 ENCOUNTER — Encounter: Payer: Self-pay | Admitting: Cardiology

## 2018-01-19 VITALS — BP 108/60 | HR 60 | Ht 72.0 in | Wt 193.4 lb

## 2018-01-19 DIAGNOSIS — I483 Typical atrial flutter: Secondary | ICD-10-CM | POA: Diagnosis not present

## 2018-01-19 DIAGNOSIS — R0989 Other specified symptoms and signs involving the circulatory and respiratory systems: Secondary | ICD-10-CM

## 2018-01-19 DIAGNOSIS — E7800 Pure hypercholesterolemia, unspecified: Secondary | ICD-10-CM | POA: Diagnosis not present

## 2018-01-19 DIAGNOSIS — I10 Essential (primary) hypertension: Secondary | ICD-10-CM | POA: Diagnosis not present

## 2018-01-19 NOTE — Patient Instructions (Signed)
Medication Instructions:   NO CHANGE  Testing/Procedures:  Your physician has requested that you have an abdominal aorta duplex. During this test, an ultrasound is used to evaluate the aorta. Allow 30 minutes for this exam. Do not eat after midnight the day before and avoid carbonated beverages   Follow-Up:  Your physician recommends that you schedule a follow-up appointment in: AS NEEDED

## 2018-01-27 ENCOUNTER — Ambulatory Visit (HOSPITAL_COMMUNITY)
Admission: RE | Admit: 2018-01-27 | Discharge: 2018-01-27 | Disposition: A | Discharge status: Home / Self Care | Payer: Medicare Other | Source: Ambulatory Visit | Attending: Cardiology | Admitting: Cardiology

## 2018-01-27 DIAGNOSIS — E785 Hyperlipidemia, unspecified: Secondary | ICD-10-CM | POA: Insufficient documentation

## 2018-01-27 DIAGNOSIS — Z87891 Personal history of nicotine dependence: Secondary | ICD-10-CM | POA: Insufficient documentation

## 2018-01-27 DIAGNOSIS — R0989 Other specified symptoms and signs involving the circulatory and respiratory systems: Secondary | ICD-10-CM | POA: Insufficient documentation

## 2018-01-27 DIAGNOSIS — I10 Essential (primary) hypertension: Secondary | ICD-10-CM | POA: Diagnosis not present

## 2018-05-10 ENCOUNTER — Ambulatory Visit: Payer: Medicare Other | Admitting: Family Medicine

## 2018-05-12 ENCOUNTER — Other Ambulatory Visit: Payer: Self-pay | Admitting: Family Medicine

## 2018-05-21 ENCOUNTER — Ambulatory Visit: Payer: Medicare Other | Admitting: Family Medicine

## 2018-05-21 VITALS — BP 102/66 | HR 53 | Temp 98.0°F | Resp 18 | Ht 72.0 in | Wt 191.0 lb

## 2018-05-21 DIAGNOSIS — M19012 Primary osteoarthritis, left shoulder: Secondary | ICD-10-CM

## 2018-05-21 DIAGNOSIS — I10 Essential (primary) hypertension: Secondary | ICD-10-CM | POA: Diagnosis not present

## 2018-05-21 DIAGNOSIS — R351 Nocturia: Secondary | ICD-10-CM

## 2018-05-21 DIAGNOSIS — R7989 Other specified abnormal findings of blood chemistry: Secondary | ICD-10-CM

## 2018-05-21 DIAGNOSIS — R739 Hyperglycemia, unspecified: Secondary | ICD-10-CM | POA: Diagnosis not present

## 2018-05-21 DIAGNOSIS — E782 Mixed hyperlipidemia: Secondary | ICD-10-CM | POA: Diagnosis not present

## 2018-05-21 LAB — COMPREHENSIVE METABOLIC PANEL
ALBUMIN: 4.1 g/dL (ref 3.5–5.2)
ALK PHOS: 40 U/L (ref 39–117)
ALT: 17 U/L (ref 0–53)
AST: 17 U/L (ref 0–37)
BUN: 13 mg/dL (ref 6–23)
CALCIUM: 9.4 mg/dL (ref 8.4–10.5)
CO2: 30 mEq/L (ref 19–32)
CREATININE: 0.92 mg/dL (ref 0.40–1.50)
Chloride: 100 mEq/L (ref 96–112)
GFR: 87.46 mL/min (ref >60.00)
Glucose, Bld: 114 mg/dL — ABNORMAL HIGH (ref 70–99)
POTASSIUM: 3.6 meq/L (ref 3.5–5.1)
SODIUM: 139 meq/L (ref 135–145)
TOTAL PROTEIN: 6.7 g/dL (ref 6.0–8.3)
Total Bilirubin: 0.8 mg/dL (ref 0.2–1.2)

## 2018-05-21 LAB — LIPID PANEL
CHOL/HDL RATIO: 3
Cholesterol: 133 mg/dL (ref 0–200)
HDL: 43.2 mg/dL (ref >39.00)
LDL Cholesterol: 71 mg/dL (ref 0–99)
NONHDL: 90.28
Triglycerides: 94 mg/dL (ref 0.0–149.0)
VLDL: 18.8 mg/dL (ref 0.0–40.0)

## 2018-05-21 LAB — CBC
HCT: 44.4 % (ref 39.0–52.0)
Hemoglobin: 15.1 g/dL (ref 13.0–17.0)
MCHC: 34.1 g/dL (ref 30.0–36.0)
MCV: 95.9 fl (ref 78.0–100.0)
PLATELETS: 216 10*3/uL (ref 150.0–400.0)
RBC: 4.63 Mil/uL (ref 4.22–5.81)
RDW: 12.4 % (ref 11.5–15.5)
WBC: 6.3 10*3/uL (ref 4.0–10.5)

## 2018-05-21 LAB — TSH: TSH: 1.26 u[IU]/mL (ref 0.35–4.50)

## 2018-05-21 LAB — HEMOGLOBIN A1C: Hgb A1c MFr Bld: 5.8 % (ref 4.6–6.5)

## 2018-05-21 NOTE — Assessment & Plan Note (Signed)
monitor

## 2018-05-21 NOTE — Patient Instructions (Addendum)
Shingrix is the new shingles shot, 2 shots over 2-6 months, at pharmacy Can mix Lidocaine gel, blue emu and CBD oil as needed on the knees, stay active  Hypertension Hypertension, commonly called high blood pressure, is when the force of blood pumping through the arteries is too strong. The arteries are the blood vessels that carry blood from the heart throughout the body. Hypertension forces the heart to work harder to pump blood and may cause arteries to become narrow or stiff. Having untreated or uncontrolled hypertension can cause heart attacks, strokes, kidney disease, and other problems. A blood pressure reading consists of a higher number over a lower number. Ideally, your blood pressure should be below 120/80. The first ("top") number is called the systolic pressure. It is a measure of the pressure in your arteries as your heart beats. The second ("bottom") number is called the diastolic pressure. It is a measure of the pressure in your arteries as the heart relaxes. What are the causes? The cause of this condition is not known. What increases the risk? Some risk factors for high blood pressure are under your control. Others are not. Factors you can change  Smoking.  Having type 2 diabetes mellitus, high cholesterol, or both.  Not getting enough exercise or physical activity.  Being overweight.  Having too much fat, sugar, calories, or salt (sodium) in your diet.  Drinking too much alcohol. Factors that are difficult or impossible to change  Having chronic kidney disease.  Having a family history of high blood pressure.  Age. Risk increases with age.  Race. You may be at higher risk if you are African-American.  Gender. Men are at higher risk than women before age 63. After age 53, women are at higher risk than men.  Having obstructive sleep apnea.  Stress. What are the signs or symptoms? Extremely high blood pressure (hypertensive crisis) may  cause:  Headache.  Anxiety.  Shortness of breath.  Nosebleed.  Nausea and vomiting.  Severe chest pain.  Jerky movements you cannot control (seizures).  How is this diagnosed? This condition is diagnosed by measuring your blood pressure while you are seated, with your arm resting on a surface. The cuff of the blood pressure monitor will be placed directly against the skin of your upper arm at the level of your heart. It should be measured at least twice using the same arm. Certain conditions can cause a difference in blood pressure between your right and left arms. Certain factors can cause blood pressure readings to be lower or higher than normal (elevated) for a short period of time:  When your blood pressure is higher when you are in a health care provider's office than when you are at home, this is called white coat hypertension. Most people with this condition do not need medicines.  When your blood pressure is higher at home than when you are in a health care provider's office, this is called masked hypertension. Most people with this condition may need medicines to control blood pressure.  If you have a high blood pressure reading during one visit or you have normal blood pressure with other risk factors:  You may be asked to return on a different day to have your blood pressure checked again.  You may be asked to monitor your blood pressure at home for 1 week or longer.  If you are diagnosed with hypertension, you may have other blood or imaging tests to help your health care provider understand your overall  risk for other conditions. How is this treated? This condition is treated by making healthy lifestyle changes, such as eating healthy foods, exercising more, and reducing your alcohol intake. Your health care provider may prescribe medicine if lifestyle changes are not enough to get your blood pressure under control, and if:  Your systolic blood pressure is above  130.  Your diastolic blood pressure is above 80.  Your personal target blood pressure may vary depending on your medical conditions, your age, and other factors. Follow these instructions at home: Eating and drinking  Eat a diet that is high in fiber and potassium, and low in sodium, added sugar, and fat. An example eating plan is called the DASH (Dietary Approaches to Stop Hypertension) diet. To eat this way: ? Eat plenty of fresh fruits and vegetables. Try to fill half of your plate at each meal with fruits and vegetables. ? Eat whole grains, such as whole wheat pasta, brown rice, or whole grain bread. Fill about one quarter of your plate with whole grains. ? Eat or drink low-fat dairy products, such as skim milk or low-fat yogurt. ? Avoid fatty cuts of meat, processed or cured meats, and poultry with skin. Fill about one quarter of your plate with lean proteins, such as fish, chicken without skin, beans, eggs, and tofu. ? Avoid premade and processed foods. These tend to be higher in sodium, added sugar, and fat.  Reduce your daily sodium intake. Most people with hypertension should eat less than 1,500 mg of sodium a day.  Limit alcohol intake to no more than 1 drink a day for nonpregnant women and 2 drinks a day for men. One drink equals 12 oz of beer, 5 oz of wine, or 1 oz of hard liquor. Lifestyle  Work with your health care provider to maintain a healthy body weight or to lose weight. Ask what an ideal weight is for you.  Get at least 30 minutes of exercise that causes your heart to beat faster (aerobic exercise) most days of the week. Activities may include walking, swimming, or biking.  Include exercise to strengthen your muscles (resistance exercise), such as pilates or lifting weights, as part of your weekly exercise routine. Try to do these types of exercises for 30 minutes at least 3 days a week.  Do not use any products that contain nicotine or tobacco, such as cigarettes and  e-cigarettes. If you need help quitting, ask your health care provider.  Monitor your blood pressure at home as told by your health care provider.  Keep all follow-up visits as told by your health care provider. This is important. Medicines  Take over-the-counter and prescription medicines only as told by your health care provider. Follow directions carefully. Blood pressure medicines must be taken as prescribed.  Do not skip doses of blood pressure medicine. Doing this puts you at risk for problems and can make the medicine less effective.  Ask your health care provider about side effects or reactions to medicines that you should watch for. Contact a health care provider if:  You think you are having a reaction to a medicine you are taking.  You have headaches that keep coming back (recurring).  You feel dizzy.  You have swelling in your ankles.  You have trouble with your vision. Get help right away if:  You develop a severe headache or confusion.  You have unusual weakness or numbness.  You feel faint.  You have severe pain in your chest or abdomen.  You vomit repeatedly.  You have trouble breathing. Summary  Hypertension is when the force of blood pumping through your arteries is too strong. If this condition is not controlled, it may put you at risk for serious complications.  Your personal target blood pressure may vary depending on your medical conditions, your age, and other factors. For most people, a normal blood pressure is less than 120/80.  Hypertension is treated with lifestyle changes, medicines, or a combination of both. Lifestyle changes include weight loss, eating a healthy, low-sodium diet, exercising more, and limiting alcohol. This information is not intended to replace advice given to you by your health care provider. Make sure you discuss any questions you have with your health care provider. Document Released: 09/29/2005 Document Revised: 08/27/2016  Document Reviewed: 08/27/2016 Elsevier Interactive Patient Education  [employer].

## 2018-05-21 NOTE — Assessment & Plan Note (Signed)
hgba1c acceptable, minimize simple carbs. Increase exercise as tolerated.  

## 2018-05-21 NOTE — Assessment & Plan Note (Signed)
Well controlled, no changes to meds. Encouraged heart healthy diet such as the DASH diet and exercise as tolerated.  °

## 2018-05-21 NOTE — Assessment & Plan Note (Signed)
Encouraged heart healthy diet, increase exercise, avoid trans fats, consider a krill oil cap daily 

## 2018-05-21 NOTE — Assessment & Plan Note (Signed)
L>R knees try topical treatments and stay active

## 2018-05-21 NOTE — Progress Notes (Signed)
Subjective:  I acted as a Education administrator for Dr. Charlett Blake. [place], [place]  Patient ID: William Phillips, male    DOB: 1951-07-21, 67 y.o.   MRN: [id number]  Chief Complaint  Patient presents with  . Follow-up    HPI  Patient is in today for 6 month follow up. Overall he is doing well. He continues to struggle with bilateral knee pain left worse than right. Is following with Dr Theda Sers of ortho to manage this. He tries to stay active and not let this limit him too much. No recent trauma or fall. No recent febrile illness or hospitalizations. Denies CP/palp/SOB/HA/congestion/fevers/GI or GU c/o. Taking meds as prescribed  Patient Care Team: Mosie Lukes, MD as PCP - General (Family Medicine)   Past Medical History:  Diagnosis Date  . Allergic rhinitis   . Ankle fracture 1998  . Atrial flutter ([place])    a. s/p CTI ablation by Dr Rayann Heman 11/2014  . Back pain 08/05/2015  . BPH (benign prostatic hyperplasia) 08/26/2015  . Ganglion cyst of dorsum of right wrist 04/08/2017  . GERD (gastroesophageal reflux disease)   . Hammer toe of right foot 05/06/2013  . History of carpal tunnel syndrome   . Hyperlipidemia   . Hypertension   . Insect bite 04/23/2015  . OA (osteoarthritis) 02/06/2013   knees  . Pedal edema 04/08/2017    Past Surgical History:  Procedure Laterality Date  . ABLATION  11-24-14   CTI ablation by Dr Rayann Heman  . ANKLE SURGERY    . ATRIAL FLUTTER ABLATION N/A 11/24/2014   Procedure: ATRIAL FLUTTER ABLATION;  Surgeon: Thompson Grayer, MD;  Location: [facility] CATH LAB;  Service: Cardiovascular;  Laterality: N/A;  . Carpel tunnel    . COLONOSCOPY  08/04/2002   Normal Exam- Dr Henrene Pastor  . INGUINAL HERNIA REPAIR  2008  . TONSILLECTOMY      Family History  Problem Relation Age of Onset  . Coronary artery disease Father        aortic valve disease  . Heart attack Father 72  . COPD Father   . Hypertension Father   . Hyperlipidemia Father   . Dementia Father   . Diabetes type II Maternal  Grandfather   . Arthritis Mother   . Stroke Mother   . Diabetes Sister   . Diabetes Brother   . Hyperlipidemia Unknown   . Hypertension Unknown     Social History   Socioeconomic History  . Marital status: Married    Spouse name: Not on file  . Number of children: 1  . Years of education: Not on file  . Highest education level: Not on file  Occupational History    Comment: Sales  Social Needs  . Financial resource strain: Not on file  . Food insecurity:    Worry: Not on file    Inability: Not on file  . Transportation needs:    Medical: Not on file    Non-medical: Not on file  Tobacco Use  . Smoking status: Former Research scientist (life sciences)  . Smokeless tobacco: Never Used  . Tobacco comment: 5-10 pack year history  Substance and Sexual Activity  . Alcohol use: Yes    Comment: 2-3 glasses of wine  . Drug use: No  . Sexual activity: Yes  Lifestyle  . Physical activity:    Days per week: Not on file    Minutes per session: Not on file  . Stress: Not on file  Relationships  . Social connections:  Talks on phone: Not on file    Gets together: Not on file    Attends religious service: Not on file    Active member of club or organization: Not on file    Attends meetings of clubs or organizations: Not on file    Relationship status: Not on file  . Intimate partner violence:    Fear of current or ex partner: Not on file    Emotionally abused: Not on file    Physically abused: Not on file    Forced sexual activity: Not on file  Other Topics Concern  . 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 amLODipine (NORVASC) 10 MG tablet TAKE ONE-HALF TABLET BY  MOUTH TWO TIMES DAILY 90 tablet 3  .  aspirin 81 MG tablet Take 81 mg by mouth daily.    . metoprolol succinate (TOPROL-XL) 100 MG 24 hr tablet TAKE 1 TABLET BY MOUTH  DAILY WITH OR IMMEDIATLEY  FOLLOWING A MEAL 90 tablet 1  . Multiple Vitamin (MULTIVITAMIN) tablet Take 1 tablet by mouth daily.      Marland Kitchen omeprazole (PRILOSEC) 20 MG capsule TAKE 1 CAPSULE BY MOUTH  DAILY 90 capsule 1  . quinapril-hydrochlorothiazide (ACCURETIC) 20-12.5 MG tablet TAKE 1 TABLET BY MOUTH  DAILY 90 tablet 3  . sildenafil (REVATIO) 20 MG tablet Take 1-4 tablets (20-80 mg total) by mouth daily as needed. 90 tablet 1  . simvastatin (ZOCOR) 20 MG tablet TAKE 1 TABLET BY MOUTH AT  BEDTIME 90 tablet 1  . tamsulosin (FLOMAX) 0.4 MG CAPS capsule TAKE 1 CAPSULE BY MOUTH  DAILY 90 capsule 1  . Flaxseed, Linseed, (FLAX SEED OIL) 1000 MG CAPS Take 1 capsule by mouth daily.     No facility-administered medications prior to visit.     Allergies  Allergen Reactions  . Penicillins     REACTION: swelling , irriation    Review of Systems  Constitutional: Negative for fever and malaise/fatigue.  HENT: Negative for congestion.   Eyes: Negative for blurred vision.  Respiratory: Negative for shortness of breath.   Cardiovascular: Negative for chest pain, palpitations and leg swelling.  Gastrointestinal: Negative for abdominal pain, blood in stool and nausea.  Genitourinary: Negative for dysuria and frequency.  Musculoskeletal: Positive for joint pain. Negative for falls.  Skin: Negative for rash.  Neurological: Negative for dizziness, loss of consciousness and headaches.  Endo/Heme/Allergies: Negative for environmental allergies.  Psychiatric/Behavioral: Negative for depression. The patient is not nervous/anxious.        Objective:    Physical Exam  Constitutional: He is oriented to person, place, and time. He appears well-developed and well-nourished. No distress.  HENT:  Head: Normocephalic and atraumatic.  Nose: Nose normal.  Eyes: Right eye exhibits no  discharge. Left eye exhibits no discharge.  Neck: Normal range of motion. Neck supple.  Cardiovascular: Normal rate and regular rhythm.  No murmur heard. Pulmonary/Chest: Effort normal and breath sounds normal.  Abdominal: Soft. Bowel sounds are normal. There is no tenderness.  Musculoskeletal: He exhibits no edema.  Neurological: He is alert and oriented to person, place, and time.  Skin: Skin is warm and dry.  Psychiatric: He has a normal mood and affect.  Nursing note and vitals reviewed.   BP 102/66 (BP Location: Left Arm, Patient Position: Sitting, Cuff Size: Normal)   Pulse (!) 53   Temp 98 F (36.7 C) (Oral)   Resp 18   Ht 6' (1.829 m)   Wt 191 lb (86.6 kg)   SpO2 97%   BMI 25.90 kg/m  Wt Readings from Last 3 Encounters:  05/21/18 191 lb (86.6 kg)  01/19/18 193 lb 6.4 oz (87.7 kg)  11/05/17 199 lb (90.3 kg)   BP Readings from Last 3 Encounters:  05/21/18 102/66  01/19/18 108/60  11/05/17 121/66     Immunization History  Administered Date(s) Administered  . Influenza Whole 07/25/2009  . Influenza, High Dose Seasonal PF 07/29/2017  . Influenza,inj,Quad PF,6+ Mos 07/24/2015, 07/18/2016  . Pneumococcal Conjugate-13 11/02/2013  . Pneumococcal Polysaccharide-23 11/05/2017  . Td 01/07/2005  . Tdap 08/24/2015  . Zoster 02/04/2013    Health Maintenance  Topic Date Due  . INFLUENZA VACCINE  05/13/2018  . COLONOSCOPY  08/22/2020  . TETANUS/TDAP  08/23/2025  . Hepatitis C Screening  Completed  . PNA vac Low Risk Adult  Completed    Lab Results  Component Value Date   WBC 6.3 05/21/2018   HGB 15.1 05/21/2018   HCT 44.4 05/21/2018   PLT 216.0 05/21/2018   GLUCOSE 114 (H) 05/21/2018   CHOL 133 05/21/2018   TRIG 94.0 05/21/2018   HDL 43.20 05/21/2018   LDLCALC 71 05/21/2018   ALT 17 05/21/2018   AST 17 05/21/2018   NA 139 05/21/2018   K 3.6 05/21/2018   CL 100 05/21/2018   CREATININE 0.92 05/21/2018   BUN 13 05/21/2018   CO2 30 05/21/2018   TSH 1.26  05/21/2018   PSA 1.63 10/19/2015   HGBA1C 5.8 05/21/2018   MICROALBUR 0.7 10/19/2015    Lab Results  Component Value Date   TSH 1.26 05/21/2018   Lab Results  Component Value Date   WBC 6.3 05/21/2018   HGB 15.1 05/21/2018   HCT 44.4 05/21/2018   MCV 95.9 05/21/2018   PLT 216.0 05/21/2018   Lab Results  Component Value Date   NA 139 05/21/2018   K 3.6 05/21/2018   CO2 30 05/21/2018   GLUCOSE 114 (H) 05/21/2018   BUN 13 05/21/2018   CREATININE 0.92 05/21/2018   BILITOT 0.8 05/21/2018   ALKPHOS 40 05/21/2018   AST 17 05/21/2018   ALT 17 05/21/2018   PROT 6.7 05/21/2018   ALBUMIN 4.1 05/21/2018   CALCIUM 9.4 05/21/2018   ANIONGAP 15 09/25/2014   GFR 87.46 05/21/2018   Lab Results  Component Value Date   CHOL 133 05/21/2018   Lab Results  Component Value Date   HDL 43.20 05/21/2018   Lab Results  Component Value Date   LDLCALC 71 05/21/2018   Lab Results  Component Value Date   TRIG 94.0 05/21/2018   Lab Results  Component Value Date   CHOLHDL 3 05/21/2018   Lab Results  Component Value Date   HGBA1C 5.8 05/21/2018         Assessment & Plan:   Problem List Items Addressed This Visit    Hyperlipidemia, mixed    Encouraged heart healthy diet, increase exercise, avoid trans fats, consider a krill oil cap daily      Relevant Orders   Lipid panel (Completed)   Lipid panel   Essential hypertension    Well controlled, no changes to meds.  Encouraged heart healthy diet such as the DASH diet and exercise as tolerated.       Relevant Orders   CBC (Completed)   Comprehensive metabolic panel (Completed)   TSH (Completed)   TSH   CBC   Comprehensive metabolic panel   Hyperglycemia    hgba1c acceptable, minimize simple carbs. Increase exercise as tolerated.       Relevant Orders   Hemoglobin A1c (Completed)   Hemoglobin A1c   OA (osteoarthritis)    L>R knees try topical treatments and stay active      Abnormal TSH    monitor       Nocturia - Primary    Up 1-3 x a night but no dysuria. Offered referral to urology and declines.       Relevant Orders   PSA   Urinalysis      I have discontinued [name] "Ken"'s Flax Seed Oil. I am also having him maintain his multivitamin, aspirin, sildenafil, omeprazole, amLODipine, simvastatin, metoprolol succinate, quinapril-hydrochlorothiazide, and tamsulosin.  No orders of the defined types were placed in this encounter.   CMA served as Education administrator during this visit. History, Physical and Plan performed by medical provider. Documentation and orders reviewed and attested to.  Penni Homans, MD

## 2018-05-21 NOTE — Assessment & Plan Note (Signed)
Up 1-3 x a night but no dysuria. Offered referral to urology and declines.

## 2018-08-24 DIAGNOSIS — M9904 Segmental and somatic dysfunction of sacral region: Secondary | ICD-10-CM | POA: Diagnosis not present

## 2018-08-24 DIAGNOSIS — M5137 Other intervertebral disc degeneration, lumbosacral region: Secondary | ICD-10-CM | POA: Diagnosis not present

## 2018-08-24 DIAGNOSIS — M9902 Segmental and somatic dysfunction of thoracic region: Secondary | ICD-10-CM | POA: Diagnosis not present

## 2018-08-24 DIAGNOSIS — M5431 Sciatica, right side: Secondary | ICD-10-CM | POA: Diagnosis not present

## 2018-08-24 DIAGNOSIS — M9903 Segmental and somatic dysfunction of lumbar region: Secondary | ICD-10-CM | POA: Diagnosis not present

## 2018-08-26 DIAGNOSIS — M5431 Sciatica, right side: Secondary | ICD-10-CM | POA: Diagnosis not present

## 2018-08-26 DIAGNOSIS — M9902 Segmental and somatic dysfunction of thoracic region: Secondary | ICD-10-CM | POA: Diagnosis not present

## 2018-08-26 DIAGNOSIS — M9904 Segmental and somatic dysfunction of sacral region: Secondary | ICD-10-CM | POA: Diagnosis not present

## 2018-08-26 DIAGNOSIS — M9903 Segmental and somatic dysfunction of lumbar region: Secondary | ICD-10-CM | POA: Diagnosis not present

## 2018-08-26 DIAGNOSIS — M5137 Other intervertebral disc degeneration, lumbosacral region: Secondary | ICD-10-CM | POA: Diagnosis not present

## 2018-09-02 DIAGNOSIS — M5431 Sciatica, right side: Secondary | ICD-10-CM | POA: Diagnosis not present

## 2018-09-02 DIAGNOSIS — M9903 Segmental and somatic dysfunction of lumbar region: Secondary | ICD-10-CM | POA: Diagnosis not present

## 2018-09-02 DIAGNOSIS — M5137 Other intervertebral disc degeneration, lumbosacral region: Secondary | ICD-10-CM | POA: Diagnosis not present

## 2018-09-02 DIAGNOSIS — M9902 Segmental and somatic dysfunction of thoracic region: Secondary | ICD-10-CM | POA: Diagnosis not present

## 2018-09-02 DIAGNOSIS — M9904 Segmental and somatic dysfunction of sacral region: Secondary | ICD-10-CM | POA: Diagnosis not present

## 2018-09-03 DIAGNOSIS — M5137 Other intervertebral disc degeneration, lumbosacral region: Secondary | ICD-10-CM | POA: Diagnosis not present

## 2018-09-03 DIAGNOSIS — M9903 Segmental and somatic dysfunction of lumbar region: Secondary | ICD-10-CM | POA: Diagnosis not present

## 2018-09-03 DIAGNOSIS — M9902 Segmental and somatic dysfunction of thoracic region: Secondary | ICD-10-CM | POA: Diagnosis not present

## 2018-09-03 DIAGNOSIS — M9904 Segmental and somatic dysfunction of sacral region: Secondary | ICD-10-CM | POA: Diagnosis not present

## 2018-09-03 DIAGNOSIS — M5431 Sciatica, right side: Secondary | ICD-10-CM | POA: Diagnosis not present

## 2018-09-06 DIAGNOSIS — M5431 Sciatica, right side: Secondary | ICD-10-CM | POA: Diagnosis not present

## 2018-09-06 DIAGNOSIS — M5137 Other intervertebral disc degeneration, lumbosacral region: Secondary | ICD-10-CM | POA: Diagnosis not present

## 2018-09-06 DIAGNOSIS — M9904 Segmental and somatic dysfunction of sacral region: Secondary | ICD-10-CM | POA: Diagnosis not present

## 2018-09-06 DIAGNOSIS — M9903 Segmental and somatic dysfunction of lumbar region: Secondary | ICD-10-CM | POA: Diagnosis not present

## 2018-09-06 DIAGNOSIS — M9902 Segmental and somatic dysfunction of thoracic region: Secondary | ICD-10-CM | POA: Diagnosis not present

## 2018-09-07 DIAGNOSIS — M5137 Other intervertebral disc degeneration, lumbosacral region: Secondary | ICD-10-CM | POA: Diagnosis not present

## 2018-09-07 DIAGNOSIS — M9904 Segmental and somatic dysfunction of sacral region: Secondary | ICD-10-CM | POA: Diagnosis not present

## 2018-09-07 DIAGNOSIS — M9903 Segmental and somatic dysfunction of lumbar region: Secondary | ICD-10-CM | POA: Diagnosis not present

## 2018-09-07 DIAGNOSIS — M5431 Sciatica, right side: Secondary | ICD-10-CM | POA: Diagnosis not present

## 2018-09-07 DIAGNOSIS — M9902 Segmental and somatic dysfunction of thoracic region: Secondary | ICD-10-CM | POA: Diagnosis not present

## 2018-09-13 DIAGNOSIS — M9904 Segmental and somatic dysfunction of sacral region: Secondary | ICD-10-CM | POA: Diagnosis not present

## 2018-09-13 DIAGNOSIS — M5137 Other intervertebral disc degeneration, lumbosacral region: Secondary | ICD-10-CM | POA: Diagnosis not present

## 2018-09-13 DIAGNOSIS — M9902 Segmental and somatic dysfunction of thoracic region: Secondary | ICD-10-CM | POA: Diagnosis not present

## 2018-09-13 DIAGNOSIS — M5431 Sciatica, right side: Secondary | ICD-10-CM | POA: Diagnosis not present

## 2018-09-13 DIAGNOSIS — M9903 Segmental and somatic dysfunction of lumbar region: Secondary | ICD-10-CM | POA: Diagnosis not present

## 2018-09-15 DIAGNOSIS — M9902 Segmental and somatic dysfunction of thoracic region: Secondary | ICD-10-CM | POA: Diagnosis not present

## 2018-09-15 DIAGNOSIS — M5431 Sciatica, right side: Secondary | ICD-10-CM | POA: Diagnosis not present

## 2018-09-15 DIAGNOSIS — M5137 Other intervertebral disc degeneration, lumbosacral region: Secondary | ICD-10-CM | POA: Diagnosis not present

## 2018-09-15 DIAGNOSIS — M9904 Segmental and somatic dysfunction of sacral region: Secondary | ICD-10-CM | POA: Diagnosis not present

## 2018-09-15 DIAGNOSIS — M9903 Segmental and somatic dysfunction of lumbar region: Secondary | ICD-10-CM | POA: Diagnosis not present

## 2018-11-19 ENCOUNTER — Other Ambulatory Visit (INDEPENDENT_AMBULATORY_CARE_PROVIDER_SITE_OTHER): Payer: Medicare Other

## 2018-11-19 DIAGNOSIS — E782 Mixed hyperlipidemia: Secondary | ICD-10-CM

## 2018-11-19 DIAGNOSIS — R351 Nocturia: Secondary | ICD-10-CM | POA: Diagnosis not present

## 2018-11-19 DIAGNOSIS — I10 Essential (primary) hypertension: Secondary | ICD-10-CM | POA: Diagnosis not present

## 2018-11-19 DIAGNOSIS — R739 Hyperglycemia, unspecified: Secondary | ICD-10-CM

## 2018-11-19 LAB — COMPREHENSIVE METABOLIC PANEL
ALT: 18 U/L (ref 0–53)
AST: 21 U/L (ref 0–37)
Albumin: 4.4 g/dL (ref 3.5–5.2)
Alkaline Phosphatase: 46 U/L (ref 39–117)
BUN: 10 mg/dL (ref 6–23)
CHLORIDE: 99 meq/L (ref 96–112)
CO2: 28 meq/L (ref 19–32)
Calcium: 9.2 mg/dL (ref 8.4–10.5)
Creatinine, Ser: 0.94 mg/dL (ref 0.40–1.50)
GFR: 80.14 mL/min (ref >60.00)
Glucose, Bld: 124 mg/dL — ABNORMAL HIGH (ref 70–99)
Potassium: 3.6 mEq/L (ref 3.5–5.1)
Sodium: 137 mEq/L (ref 135–145)
Total Bilirubin: 1.3 mg/dL — ABNORMAL HIGH (ref 0.2–1.2)
Total Protein: 6.9 g/dL (ref 6.0–8.3)

## 2018-11-19 LAB — URINALYSIS, ROUTINE W REFLEX MICROSCOPIC
Bilirubin Urine: NEGATIVE
HGB URINE DIPSTICK: NEGATIVE
Ketones, ur: NEGATIVE
Nitrite: NEGATIVE
RBC / HPF: NONE SEEN (ref 0–?)
Specific Gravity, Urine: 1.01 (ref 1.000–1.030)
Total Protein, Urine: NEGATIVE
Urine Glucose: NEGATIVE
Urobilinogen, UA: 0.2 (ref 0.0–1.0)
pH: 8 (ref 5.0–8.0)

## 2018-11-19 LAB — TSH: TSH: 2.43 u[IU]/mL (ref 0.35–4.50)

## 2018-11-19 LAB — CBC
HCT: 46.2 % (ref 39.0–52.0)
Hemoglobin: 15.6 g/dL (ref 13.0–17.0)
MCHC: 33.9 g/dL (ref 30.0–36.0)
MCV: 97.5 fl (ref 78.0–100.0)
Platelets: 212 10*3/uL (ref 150.0–400.0)
RBC: 4.74 Mil/uL (ref 4.22–5.81)
RDW: 12.7 % (ref 11.5–15.5)
WBC: 6.1 10*3/uL (ref 4.0–10.5)

## 2018-11-19 LAB — HEMOGLOBIN A1C: Hgb A1c MFr Bld: 5.6 % (ref 4.6–6.5)

## 2018-11-19 LAB — LIPID PANEL
Cholesterol: 178 mg/dL (ref 0–200)
HDL: 64.4 mg/dL (ref >39.00)
LDL Cholesterol: 87 mg/dL (ref 0–99)
NONHDL: 113.59
Total CHOL/HDL Ratio: 3
Triglycerides: 132 mg/dL (ref 0.0–149.0)
VLDL: 26.4 mg/dL (ref 0.0–40.0)

## 2018-11-19 LAB — PSA: PSA: 2.22 ng/mL (ref 0.10–4.00)

## 2018-11-21 ENCOUNTER — Other Ambulatory Visit: Payer: Self-pay | Admitting: Family Medicine

## 2018-11-25 ENCOUNTER — Ambulatory Visit (INDEPENDENT_AMBULATORY_CARE_PROVIDER_SITE_OTHER): Payer: Medicare Other | Admitting: Family Medicine

## 2018-11-25 ENCOUNTER — Encounter: Payer: Self-pay | Admitting: Family Medicine

## 2018-11-25 DIAGNOSIS — Z0000 Encounter for general adult medical examination without abnormal findings: Secondary | ICD-10-CM | POA: Diagnosis not present

## 2018-11-25 DIAGNOSIS — I10 Essential (primary) hypertension: Secondary | ICD-10-CM | POA: Diagnosis not present

## 2018-11-25 DIAGNOSIS — R739 Hyperglycemia, unspecified: Secondary | ICD-10-CM | POA: Diagnosis not present

## 2018-11-25 DIAGNOSIS — E782 Mixed hyperlipidemia: Secondary | ICD-10-CM | POA: Diagnosis not present

## 2018-11-25 MED ORDER — SCOPOLAMINE 1 MG/3DAYS TD PT72: 1.0000 | MEDICATED_PATCH | TRANSDERMAL | 0 refills | Status: DC

## 2018-11-25 NOTE — Assessment & Plan Note (Signed)
hgba1c acceptable, minimize simple carbs. Increase exercise as tolerated.  

## 2018-11-25 NOTE — Progress Notes (Signed)
Subjective:    Patient ID: William Phillips, male    DOB: 06-18-1951, 68 y.o.   MRN: [id number]  No chief complaint on file.   HPI Patient is in today for annual preventative exam and follow up on chronic medical concerns including hypertension, hyperlipidemia and hyperglycemia. He feels well today. No recent febrile illness or hospitalizations. He is preparing to go a cruise soon and is requesting a presciption for scopolamine patches. He is staying active and he is trying to maintain a heart healthy diet. He is managing his activities of daily living. Denies CP/palp/SOB/HA/congestion/fevers/GI or GU c/o. Taking meds as prescribed  Past Medical History:  Diagnosis Date  . Allergic rhinitis   . Ankle fracture 1998  . Atrial flutter ([place])    a. s/p CTI ablation by Dr Rayann Heman 11/2014  . Back pain 08/05/2015  . BPH (benign prostatic hyperplasia) 08/26/2015  . Ganglion cyst of dorsum of right wrist 04/08/2017  . GERD (gastroesophageal reflux disease)   . Hammer toe of right foot 05/06/2013  . History of carpal tunnel syndrome   . Hyperlipidemia   . Hypertension   . Insect bite 04/23/2015  . OA (osteoarthritis) 02/06/2013   knees  . Pedal edema 04/08/2017  . Preventative health care 04/27/2011   Had normal colonoscopy in 2011     Past Surgical History:  Procedure Laterality Date  . ABLATION  11-24-14   CTI ablation by Dr Rayann Heman  . ANKLE SURGERY    . ATRIAL FLUTTER ABLATION N/A 11/24/2014   Procedure: ATRIAL FLUTTER ABLATION;  Surgeon: Thompson Grayer, MD;  Location: [facility] C[facility] LAB;  Service: Cardiovascular;  Laterality: N/A;  . Carpel tunnel    . COLONOSCOPY  08/04/2002   Normal Exam- Dr Henrene Pastor  . INGUINAL HERNIA REPAIR  2008  . TONSILLECTOMY      Family History  Problem Relation Age of Onset  . Coronary artery disease Father        aortic valve disease  . Heart attack Father 36  . COPD Father   . Hypertension Father   . Hyperlipidemia Father   . Dementia Father   . Diabetes type  II Maternal Grandfather   . Diabetes Maternal Grandfather   . Arthritis Mother   . Stroke Mother   . Diabetes Sister   . Hyperlipidemia Other   . Hypertension Other     Social History   Socioeconomic History  . Marital status: Married    Spouse name: Not on file  . Number of children: 1  . Years of education: Not on file  . Highest education level: Not on file  Occupational History    Comment: Sales  Social Needs  . Financial resource strain: Not on file  . Food insecurity:    Worry: Not on file    Inability: Not on file  . Transportation needs:    Medical: Not on file    Non-medical: Not on file  Tobacco Use  . Smoking status: Former Research scientist (life sciences)  . Smokeless tobacco: Never Used  . Tobacco comment: 5-10 pack year history  Substance and Sexual Activity  . Alcohol use: Yes    Comment: 2-3 glasses of wine  . Drug use: No  . Sexual activity: Yes  Lifestyle  . Physical activity:    Days per week: Not on file    Minutes per session: Not on file  . Stress: Not on file  Relationships  . Social connections:    Talks on phone: Not on file  Gets together: Not on file    Attends religious service: Not on file    Active member of club or organization: Not on file    Attends meetings of clubs or organizations: Not on file    Relationship status: Not on file  . Intimate partner violence:    Fear of current or ex partner: Not on file    Emotionally abused: Not on file    Physically abused: Not on file    Forced sexual activity: Not on file  Other Topics Concern  . 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 amLODipine (NORVASC) 10 MG tablet TAKE ONE-HALF TABLET BY  MOUTH TWO TIMES DAILY  90 tablet 3  . aspirin 81 MG tablet Take 81 mg by mouth daily.    . metoprolol succinate (TOPROL-XL) 100 MG 24 hr tablet TAKE 1 TABLET BY MOUTH  DAILY WITH OR IMMEDIATLEY  FOLLOWING A MEAL 90 tablet 1  . Multiple Vitamin (MULTIVITAMIN) tablet Take 1 tablet by mouth daily.      Marland Kitchen omeprazole (PRILOSEC) 20 MG capsule TAKE 1 CAPSULE BY MOUTH  DAILY 90 capsule 1  . quinapril-hydrochlorothiazide (ACCURETIC) 20-12.5 MG tablet TAKE 1 TABLET BY MOUTH  DAILY 90 tablet 3  . sildenafil (REVATIO) 20 MG tablet Take 1-4 tablets (20-80 mg total) by mouth daily as needed. 90 tablet 1  . simvastatin (ZOCOR) 20 MG tablet TAKE 1 TABLET BY MOUTH AT  BEDTIME 90 tablet 1  . tamsulosin (FLOMAX) 0.4 MG CAPS capsule TAKE 1 CAPSULE BY MOUTH  DAILY 90 capsule 1   No facility-administered medications prior to visit.     Allergies  Allergen Reactions  . Penicillins     REACTION: swelling , irriation    Review of Systems  Constitutional: Negative for chills, fever and malaise/fatigue.  HENT: Negative for congestion and hearing loss.   Eyes: Negative for discharge.  Respiratory: Negative for cough, sputum production and shortness of breath.   Cardiovascular: Negative for chest pain, palpitations and leg swelling.  Gastrointestinal: Negative for abdominal pain, blood in stool, constipation, diarrhea, heartburn, nausea and vomiting.  Genitourinary: Negative for dysuria, frequency, hematuria and urgency.  Musculoskeletal: Negative for back pain, falls and myalgias.  Skin: Negative for rash.  Neurological: Negative for dizziness, sensory change, loss of consciousness, weakness and headaches.  Endo/Heme/Allergies: Negative for environmental allergies. Does not bruise/bleed easily.  Psychiatric/Behavioral: Negative for depression and suicidal ideas. The patient is not nervous/anxious and does not have insomnia.        Objective:    Physical Exam Vitals signs and nursing note reviewed.  Constitutional:       General: He is not in acute distress.    Appearance: He is well-developed.  HENT:     Head: Normocephalic and atraumatic.     Right Ear: Tympanic membrane and ear canal normal.     Left Ear: Tympanic membrane and ear canal normal.     Nose: Nose normal.  Eyes:     General:        Right eye: No discharge.        Left eye: No discharge.  Neck:  Musculoskeletal: Normal range of motion and neck supple.  Cardiovascular:     Rate and Rhythm: Normal rate and regular rhythm.     Pulses: Normal pulses.     Heart sounds: Normal heart sounds. No murmur.  Pulmonary:     Effort: Pulmonary effort is normal.     Breath sounds: Normal breath sounds.  Abdominal:     General: Bowel sounds are normal.     Palpations: Abdomen is soft.     Tenderness: There is no abdominal tenderness.  Skin:    General: Skin is warm and dry.  Neurological:     Mental Status: He is alert and oriented to person, place, and time.     BP 120/62 (BP Location: Left Arm, Patient Position: Sitting, Cuff Size: Normal)   Pulse 61   Temp 98.1 F (36.7 C) (Oral)   Resp 18   Ht 6' (1.829 m)   Wt 199 lb 12.8 oz (90.6 kg)   SpO2 95%   BMI 27.10 kg/m  Wt Readings from Last 3 Encounters:  11/25/18 199 lb 12.8 oz (90.6 kg)  05/21/18 191 lb (86.6 kg)  01/19/18 193 lb 6.4 oz (87.7 kg)     Lab Results  Component Value Date   WBC 6.1 11/19/2018   HGB 15.6 11/19/2018   HCT 46.2 11/19/2018   PLT 212.0 11/19/2018   GLUCOSE 124 (H) 11/19/2018   CHOL 178 11/19/2018   TRIG 132.0 11/19/2018   HDL 64.40 11/19/2018   LDLCALC 87 11/19/2018   ALT 18 11/19/2018   AST 21 11/19/2018   NA 137 11/19/2018   K 3.6 11/19/2018   CL 99 11/19/2018   CREATININE 0.94 11/19/2018   BUN 10 11/19/2018   CO2 28 11/19/2018   TSH 2.43 11/19/2018   PSA 2.22 11/19/2018   HGBA1C 5.6 11/19/2018   MICROALBUR 0.7 10/19/2015    Lab Results  Component Value Date   TSH 2.43 11/19/2018   Lab Results  Component Value Date   WBC 6.1  11/19/2018   HGB 15.6 11/19/2018   HCT 46.2 11/19/2018   MCV 97.5 11/19/2018   PLT 212.0 11/19/2018   Lab Results  Component Value Date   NA 137 11/19/2018   K 3.6 11/19/2018   CO2 28 11/19/2018   GLUCOSE 124 (H) 11/19/2018   BUN 10 11/19/2018   CREATININE 0.94 11/19/2018   BILITOT 1.3 (H) 11/19/2018   ALKPHOS 46 11/19/2018   AST 21 11/19/2018   ALT 18 11/19/2018   PROT 6.9 11/19/2018   ALBUMIN 4.4 11/19/2018   CALCIUM 9.2 11/19/2018   ANIONGAP 15 09/25/2014   GFR 80.14 11/19/2018   Lab Results  Component Value Date   CHOL 178 11/19/2018   Lab Results  Component Value Date   HDL 64.40 11/19/2018   Lab Results  Component Value Date   LDLCALC 87 11/19/2018   Lab Results  Component Value Date   TRIG 132.0 11/19/2018   Lab Results  Component Value Date   CHOLHDL 3 11/19/2018   Lab Results  Component Value Date   HGBA1C 5.6 11/19/2018       Assessment & Plan:   Problem List Items Addressed This Visit    Hyperlipidemia, mixed    Tolerating statin, encouraged heart healthy diet, avoid trans fats, minimize simple carbs and saturated fats. Increase exercise as tolerated      Relevant Orders   Lipid panel   Essential hypertension    Well controlled, no changes to meds. Encouraged heart  healthy diet such as the DASH diet and exercise as tolerated.       Relevant Orders   CBC   Comprehensive metabolic panel   TSH   Hyperglycemia    hgba1c acceptable, minimize simple carbs. Increase exercise as tolerated.       Relevant Orders   Hemoglobin A1c   Preventative health care    Patient encouraged to maintain heart healthy diet, regular exercise, adequate sleep. Consider daily probiotics. Take medications as prescribed         I am having [name] "Ken" start on scopolamine. I am also having him maintain his multivitamin, aspirin, sildenafil, amLODipine, quinapril-hydrochlorothiazide, metoprolol succinate, tamsulosin, omeprazole, and  simvastatin.  Meds ordered this encounter  Medications  . scopolamine (TRANSDERM-SCOP, 1.5 MG,) 1 MG/3DAYS    Sig: Place 1 patch (1.5 mg total) onto the skin every 3 (three) days.    Dispense:  3 patch    Refill:  0     Penni Homans, MD

## 2018-11-25 NOTE — Patient Instructions (Addendum)
Shingrix is the new shingles shot, 2 shots over 2-6 months at [place] 65 Years and Older, Male Preventive care refers to lifestyle choices and visits with your health care provider that can promote health and wellness. What does preventive care include?   A yearly physical exam. This is also called an annual well check.  Dental exams once or twice a year.  Routine eye exams. Ask your health care provider how often you should have your eyes checked.  Personal lifestyle choices, including: ? Daily care of your teeth and gums. ? Regular physical activity. ? Eating a healthy diet. ? Avoiding tobacco and drug use. ? Limiting alcohol use. ? Practicing safe sex. ? Taking low doses of aspirin every day. ? Taking vitamin and mineral supplements as recommended by your health care provider. What happens during an annual well check? The services and screenings done by your health care provider during your annual well check will depend on your age, overall health, lifestyle risk factors, and family history of disease. Counseling Your health care provider may ask you questions about your:  Alcohol use.  Tobacco use.  Drug use.  Emotional well-being.  Home and relationship well-being.  Sexual activity.  Eating habits.  History of falls.  Memory and ability to understand (cognition).  Work and work Statistician. Screening You may have the following tests or measurements:  Height, weight, and BMI.  Blood pressure.  Lipid and cholesterol levels. These may be checked every 5 years, or more frequently if you are over 76 years old.  Skin check.  Lung cancer screening. You may have this screening every year starting at age 17 if you have a 30-pack-year history of smoking and currently smoke or have quit within the past 15 years.  Colorectal cancer screening. All adults should have this screening starting at age 20 and continuing until age 89. You will have tests  every 1-10 years, depending on your results and the type of screening test. People at increased risk should start screening at an earlier age. Screening tests may include: ? Guaiac-based fecal occult blood testing. ? Fecal immunochemical test (FIT). ? Stool DNA test. ? Virtual colonoscopy. ? Sigmoidoscopy. During this test, a flexible tube with a tiny camera (sigmoidoscope) is used to examine your rectum and lower colon. The sigmoidoscope is inserted through your anus into your rectum and lower colon. ? Colonoscopy. During this test, a long, thin, flexible tube with a tiny camera (colonoscope) is used to examine your entire colon and rectum.  Prostate cancer screening. Recommendations will vary depending on your family history and other risks.  Hepatitis C blood test.  Hepatitis B blood test.  Sexually transmitted disease (STD) testing.  Diabetes screening. This is done by checking your blood sugar (glucose) after you have not eaten for a while (fasting). You may have this done every 1-3 years.  Abdominal aortic aneurysm (AAA) screening. You may need this if you are a current or former smoker.  Osteoporosis. You may be screened starting at age 7 if you are at high risk. Talk with your health care provider about your test results, treatment options, and if necessary, the need for more tests. Vaccines Your health care provider may recommend certain vaccines, such as:  Influenza vaccine. This is recommended every year.  Tetanus, diphtheria, and acellular pertussis (Tdap, Td) vaccine. You may need a Td booster every 10 years.  Varicella vaccine. You may need this if you have not been vaccinated.  Zoster vaccine. You  may need this after age 62.  Measles, mumps, and rubella (MMR) vaccine. You may need at least one dose of MMR if you were born in 1957 or later. You may also need a second dose.  Pneumococcal 13-valent conjugate (PCV13) vaccine. One dose is recommended after age  22.  Pneumococcal polysaccharide (PPSV23) vaccine. One dose is recommended after age 27.  Meningococcal vaccine. You may need this if you have certain conditions.  Hepatitis A vaccine. You may need this if you have certain conditions or if you travel or work in places where you may be exposed to hepatitis A.  Hepatitis B vaccine. You may need this if you have certain conditions or if you travel or work in places where you may be exposed to hepatitis B.  Haemophilus influenzae type b (Hib) vaccine. You may need this if you have certain risk factors. Talk to your health care provider about which screenings and vaccines you need and how often you need them. This information is not intended to replace advice given to you by your health care provider. Make sure you discuss any questions you have with your health care provider. Document Released: 10/26/2015 Document Revised: 11/19/2017 Document Reviewed: 07/31/2015 Elsevier Interactive Patient Education  2019 [employer].

## 2018-11-25 NOTE — Assessment & Plan Note (Signed)
Patient encouraged to maintain heart healthy diet, regular exercise, adequate sleep. Consider daily probiotics. Take medications as prescribed 

## 2018-11-25 NOTE — Assessment & Plan Note (Signed)
Tolerating statin, encouraged heart healthy diet, avoid trans fats, minimize simple carbs and saturated fats. Increase exercise as tolerated 

## 2018-11-25 NOTE — Assessment & Plan Note (Signed)
Well controlled, no changes to meds. Encouraged heart healthy diet such as the DASH diet and exercise as tolerated.  °

## 2018-11-25 NOTE — Assessment & Plan Note (Addendum)
Patient is headed on a cruise and requests scopolamine patches. Prescription sent to his pharmacy

## 2019-02-21 DIAGNOSIS — M79676 Pain in unspecified toe(s): Secondary | ICD-10-CM

## 2019-04-18 ENCOUNTER — Other Ambulatory Visit: Payer: Self-pay | Admitting: Family Medicine

## 2019-05-26 ENCOUNTER — Other Ambulatory Visit (INDEPENDENT_AMBULATORY_CARE_PROVIDER_SITE_OTHER): Payer: Medicare Other

## 2019-05-26 ENCOUNTER — Other Ambulatory Visit: Payer: Medicare Other

## 2019-05-26 ENCOUNTER — Other Ambulatory Visit: Payer: Self-pay

## 2019-05-26 DIAGNOSIS — R739 Hyperglycemia, unspecified: Secondary | ICD-10-CM

## 2019-05-26 DIAGNOSIS — I10 Essential (primary) hypertension: Secondary | ICD-10-CM

## 2019-05-26 DIAGNOSIS — E782 Mixed hyperlipidemia: Secondary | ICD-10-CM

## 2019-05-27 LAB — CBC
HCT: 42.7 % (ref 39.0–52.0)
Hemoglobin: 14.6 g/dL (ref 13.0–17.0)
MCHC: 34.2 g/dL (ref 30.0–36.0)
MCV: 98.2 fl (ref 78.0–100.0)
Platelets: 205 10*3/uL (ref 150.0–400.0)
RBC: 4.35 Mil/uL (ref 4.22–5.81)
RDW: 12.6 % (ref 11.5–15.5)
WBC: 8.6 10*3/uL (ref 4.0–10.5)

## 2019-05-27 LAB — HEMOGLOBIN A1C: Hgb A1c MFr Bld: 5.9 % (ref 4.6–6.5)

## 2019-05-27 LAB — LIPID PANEL
Cholesterol: 159 mg/dL (ref 0–200)
HDL: 67.3 mg/dL (ref >39.00)
LDL Cholesterol: 66 mg/dL (ref 0–99)
NonHDL: 91.75
Total CHOL/HDL Ratio: 2
Triglycerides: 129 mg/dL (ref 0.0–149.0)
VLDL: 25.8 mg/dL (ref 0.0–40.0)

## 2019-05-27 LAB — COMPREHENSIVE METABOLIC PANEL
ALT: 19 U/L (ref 0–53)
AST: 19 U/L (ref 0–37)
Albumin: 4.4 g/dL (ref 3.5–5.2)
Alkaline Phosphatase: 64 U/L (ref 39–117)
BUN: 17 mg/dL (ref 6–23)
CO2: 29 mEq/L (ref 19–32)
Calcium: 8.9 mg/dL (ref 8.4–10.5)
Chloride: 102 mEq/L (ref 96–112)
Creatinine, Ser: 1.26 mg/dL (ref 0.40–1.50)
GFR: 57.06 mL/min — ABNORMAL LOW (ref >60.00)
Glucose, Bld: 139 mg/dL — ABNORMAL HIGH (ref 70–99)
Potassium: 3.2 mEq/L — ABNORMAL LOW (ref 3.5–5.1)
Sodium: 140 mEq/L (ref 135–145)
Total Bilirubin: 0.7 mg/dL (ref 0.2–1.2)
Total Protein: 6.8 g/dL (ref 6.0–8.3)

## 2019-05-27 LAB — TSH: TSH: 1.05 u[IU]/mL (ref 0.35–4.50)

## 2019-05-27 MED ORDER — POTASSIUM CHLORIDE CRYS ER 20 MEQ PO TBCR: EXTENDED_RELEASE_TABLET | ORAL | 3 refills | Status: DC

## 2019-05-27 MED ORDER — POTASSIUM CHLORIDE CRYS ER 20 MEQ PO TBCR: 20.0000 meq | EXTENDED_RELEASE_TABLET | Freq: Every day | ORAL | 3 refills | Status: DC

## 2019-05-27 NOTE — Addendum Note (Signed)
Addended by: Magdalene Molly A on: 05/27/2019 03:32 PM   Modules accepted: Orders

## 2019-06-02 ENCOUNTER — Ambulatory Visit (INDEPENDENT_AMBULATORY_CARE_PROVIDER_SITE_OTHER): Payer: Medicare Other | Admitting: Family Medicine

## 2019-06-02 ENCOUNTER — Other Ambulatory Visit: Payer: Self-pay

## 2019-06-02 ENCOUNTER — Encounter: Payer: Self-pay | Admitting: Family Medicine

## 2019-06-02 VITALS — BP 90/62 | HR 65 | Temp 97.9°F | Resp 18 | Wt 195.2 lb

## 2019-06-02 DIAGNOSIS — U071 COVID-19: Secondary | ICD-10-CM

## 2019-06-02 DIAGNOSIS — I10 Essential (primary) hypertension: Secondary | ICD-10-CM | POA: Diagnosis not present

## 2019-06-02 DIAGNOSIS — E876 Hypokalemia: Secondary | ICD-10-CM

## 2019-06-02 DIAGNOSIS — Z0184 Encounter for antibody response examination: Secondary | ICD-10-CM | POA: Diagnosis not present

## 2019-06-02 DIAGNOSIS — R739 Hyperglycemia, unspecified: Secondary | ICD-10-CM | POA: Diagnosis not present

## 2019-06-02 DIAGNOSIS — E782 Mixed hyperlipidemia: Secondary | ICD-10-CM

## 2019-06-02 DIAGNOSIS — J988 Other specified respiratory disorders: Secondary | ICD-10-CM | POA: Diagnosis not present

## 2019-06-02 DIAGNOSIS — Z7189 Other specified counseling: Secondary | ICD-10-CM

## 2019-06-02 DIAGNOSIS — F528 Other sexual dysfunction not due to a substance or known physiological condition: Secondary | ICD-10-CM

## 2019-06-02 LAB — COMPREHENSIVE METABOLIC PANEL
ALT: 19 U/L (ref 0–53)
AST: 21 U/L (ref 0–37)
Albumin: 4.7 g/dL (ref 3.5–5.2)
Alkaline Phosphatase: 49 U/L (ref 39–117)
BUN: 12 mg/dL (ref 6–23)
CO2: 30 mEq/L (ref 19–32)
Calcium: 9.9 mg/dL (ref 8.4–10.5)
Chloride: 100 mEq/L (ref 96–112)
Creatinine, Ser: 0.92 mg/dL (ref 0.40–1.50)
GFR: 82.03 mL/min (ref >60.00)
Glucose, Bld: 126 mg/dL — ABNORMAL HIGH (ref 70–99)
Potassium: 4.8 mEq/L (ref 3.5–5.1)
Sodium: 140 mEq/L (ref 135–145)
Total Bilirubin: 0.7 mg/dL (ref 0.2–1.2)
Total Protein: 7.2 g/dL (ref 6.0–8.3)

## 2019-06-02 LAB — MAGNESIUM: Magnesium: 2.1 mg/dL (ref 1.5–2.5)

## 2019-06-02 NOTE — Patient Instructions (Addendum)
Pulse oximeter  Weekly vitals  Hypokalemia Hypokalemia means that the amount of potassium in the blood is lower than normal. Potassium is a chemical (electrolyte) that helps regulate the amount of fluid in the body. It also stimulates muscle tightening (contraction) and helps nerves work properly. Normally, most of the body's potassium is inside cells, and only a very small amount is in the blood. Because the amount in the blood is so small, minor changes to potassium levels in the blood can be life-threatening. What are the causes? This condition may be caused by:  Antibiotic medicine.  Diarrhea or vomiting. Taking too much of a medicine that helps you have a bowel movement (laxative) can cause diarrhea and lead to hypokalemia.  Chronic kidney disease (CKD).  Medicines that help the body get rid of excess fluid (diuretics).  Eating disorders, such as bulimia.  Low magnesium levels in the body.  Sweating a lot. What are the signs or symptoms? Symptoms of this condition include:  Weakness.  Constipation.  Fatigue.  Muscle cramps.  Mental confusion.  Skipped heartbeats or irregular heartbeat (palpitations).  Tingling or numbness. How is this diagnosed? This condition is diagnosed with a blood test. How is this treated? This condition may be treated by:  Taking potassium supplements by mouth.  Adjusting the medicines that you take.  Eating more foods that contain a lot of potassium. If your potassium level is very low, you may need to get potassium through an IV and be monitored in the hospital. Follow these instructions at home:   Take over-the-counter and prescription medicines only as told by your health care provider. This includes vitamins and supplements.  Eat a healthy diet. A healthy diet includes fresh fruits and vegetables, whole grains, healthy fats, and lean proteins.  If instructed, eat more foods that contain a lot of potassium. This  includes: ? Nuts, such as peanuts and pistachios. ? Seeds, such as sunflower seeds and pumpkin seeds. ? Peas, lentils, and lima beans. ? Whole grain and bran cereals and breads. ? Fresh fruits and vegetables, such as apricots, avocado, bananas, cantaloupe, kiwi, oranges, tomatoes, asparagus, and potatoes. ? Orange juice. ? Tomato juice. ? Red meats. ? Yogurt.  Keep all follow-up visits as told by your health care provider. This is important. Contact a health care provider if you:  Have weakness that gets worse.  Feel your heart pounding or racing.  Vomit.  Have diarrhea.  Have diabetes (diabetes mellitus) and you have trouble keeping your blood sugar (glucose) in your target range. Get help right away if you:  Have chest pain.  Have shortness of breath.  Have vomiting or diarrhea that lasts for more than 2 days.  Faint. Summary  Hypokalemia means that the amount of potassium in the blood is lower than normal.  This condition is diagnosed with a blood test.  Hypokalemia may be treated by taking potassium supplements, adjusting the medicines that you take, or eating more foods that are high in potassium.  If your potassium level is very low, you may need to get potassium through an IV and be monitored in the hospital. This information is not intended to replace advice given to you by your health care provider. Make sure you discuss any questions you have with your health care provider. Document Released: 09/29/2005 Document Revised: 05/12/2018 Document Reviewed: 05/12/2018 Elsevier Patient Education  2020 [employer].

## 2019-06-03 LAB — SAR COV2 SEROLOGY (COVID19)AB(IGG),IA: SARS CoV2 AB IGG: NEGATIVE

## 2019-06-05 DIAGNOSIS — Z7189 Other specified counseling: Secondary | ICD-10-CM | POA: Insufficient documentation

## 2019-06-05 NOTE — Assessment & Plan Note (Signed)
Well controlled, no changes to meds. Encouraged heart healthy diet such as the DASH diet and exercise as tolerated.  °

## 2019-06-05 NOTE — Assessment & Plan Note (Signed)
hgba1c acceptable, minimize simple carbs. Increase exercise as tolerated. Continue current meds 

## 2019-06-05 NOTE — Progress Notes (Signed)
Subjective:    Patient ID: William Phillips, male    DOB: 1951/07/28, 68 y.o.   MRN: [id number]  No chief complaint on file.   HPI Patient is in today for follow up on chronic medical concerns including hypertension, hyperlipidemia, and more. He feels well today. No recent febrile illness or hospitalizations. Denies CP/palp/SOB/HA/congestion/fevers/GI or GU c/o. Taking meds as prescribed  Past Medical History:  Diagnosis Date  . Allergic rhinitis   . Ankle fracture 1998  . Atrial flutter ([place])    a. s/p CTI ablation by Dr Rayann Heman 11/2014  . Back pain 08/05/2015  . BPH (benign prostatic hyperplasia) 08/26/2015  . Ganglion cyst of dorsum of right wrist 04/08/2017  . GERD (gastroesophageal reflux disease)   . Hammer toe of right foot 05/06/2013  . History of carpal tunnel syndrome   . Hyperlipidemia   . Hypertension   . Insect bite 04/23/2015  . OA (osteoarthritis) 02/06/2013   knees  . Pedal edema 04/08/2017  . Preventative health care 04/27/2011   Had normal colonoscopy in 2011     Past Surgical History:  Procedure Laterality Date  . ABLATION  11-24-14   CTI ablation by Dr Rayann Heman  . ANKLE SURGERY    . ATRIAL FLUTTER ABLATION N/A 11/24/2014   Procedure: ATRIAL FLUTTER ABLATION;  Surgeon: Thompson Grayer, MD;  Location: [facility] CATH LAB;  Service: Cardiovascular;  Laterality: N/A;  . Carpel tunnel    . COLONOSCOPY  08/04/2002   Normal Exam- Dr Henrene Pastor  . INGUINAL HERNIA REPAIR  2008  . TONSILLECTOMY      Family History  Problem Relation Age of Onset  . Coronary artery disease Father        aortic valve disease  . Heart attack Father 29  . COPD Father   . Hypertension Father   . Hyperlipidemia Father   . Dementia Father   . Diabetes type II Maternal Grandfather   . Diabetes Maternal Grandfather   . Arthritis Mother   . Stroke Mother   . Diabetes Sister   . Hyperlipidemia Other   . Hypertension Other     Social History   Socioeconomic History  . Marital status: Married    Spouse name: Not on file  . Number of children: 1  . Years of education: Not on file  . Highest education level: Not on file  Occupational History    Comment: Sales  Social Needs  . Financial resource strain: Not on file  . Food insecurity    Worry: Not on file    Inability: Not on file  . Transportation needs    Medical: Not on file    Non-medical: Not on file  Tobacco Use  . Smoking status: Former Research scientist (life sciences)  . Smokeless tobacco: Never Used  . Tobacco comment: 5-10 pack year history  Substance and Sexual Activity  . Alcohol use: Yes    Comment: 2-3 glasses of wine  . Drug use: No  . Sexual activity: Yes  Lifestyle  . Physical activity    Days per week: Not on file    Minutes per session: Not on file  . Stress: Not on file  Relationships  . Social Herbalist on phone: Not on file    Gets together: Not on file    Attends religious service: Not on file    Active member of club or organization: Not on file    Attends meetings of clubs or organizations: Not on file  Relationship status: Not on file  . Intimate partner violence    Fear of current or ex partner: Not on file    Emotionally abused: Not on file    Physically abused: Not on file    Forced sexual activity: Not on file  Other Topics Concern  . 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 amLODipine (NORVASC) 10 MG tablet TAKE ONE-HALF TABLET BY  MOUTH TWO TIMES DAILY 90 tablet 3  . aspirin 81 MG tablet Take 81 mg by mouth daily.    . metoprolol succinate (TOPROL-XL) 100 MG 24 hr tablet TAKE 1 TABLET BY MOUTH  DAILY WITH OR IMMEDIATLEY  FOLLOWING A MEAL 90 tablet 1  . Multiple Vitamin (MULTIVITAMIN) tablet Take 1  tablet by mouth daily.      Marland Kitchen omeprazole (PRILOSEC) 20 MG capsule TAKE 1 CAPSULE BY MOUTH  DAILY 90 capsule 1  . potassium chloride SA (K-DUR) 20 MEQ tablet Take 1 tablet by mouth daily x5 days a week 30 tablet 3  . quinapril-hydrochlorothiazide (ACCURETIC) 20-12.5 MG tablet TAKE 1 TABLET BY MOUTH  DAILY 90 tablet 3  . sildenafil (REVATIO) 20 MG tablet Take 1-4 tablets (20-80 mg total) by mouth daily as needed. 90 tablet 1  . simvastatin (ZOCOR) 20 MG tablet TAKE 1 TABLET BY MOUTH AT  BEDTIME 90 tablet 1  . tamsulosin (FLOMAX) 0.4 MG CAPS capsule TAKE 1 CAPSULE BY MOUTH  DAILY 90 capsule 1  . scopolamine (TRANSDERM-SCOP, 1.5 MG,) 1 MG/3DAYS Place 1 patch (1.5 mg total) onto the skin every 3 (three) days. 3 patch 0   No facility-administered medications prior to visit.     Allergies  Allergen Reactions  . Penicillins     REACTION: swelling , irriation    Review of Systems  Constitutional: Negative for fever and malaise/fatigue.  HENT: Negative for congestion.   Eyes: Negative for blurred vision.  Respiratory: Negative for shortness of breath.   Cardiovascular: Negative for chest pain, palpitations and leg swelling.  Gastrointestinal: Negative for abdominal pain, blood in stool and nausea.  Genitourinary: Negative for dysuria and frequency.  Musculoskeletal: Negative for falls.  Skin: Negative for rash.  Neurological: Negative for dizziness, loss of consciousness and headaches.  Endo/Heme/Allergies: Negative for environmental allergies.  Psychiatric/Behavioral: Negative for depression. The patient is not nervous/anxious.        Objective:    Physical Exam Vitals signs and nursing note reviewed.  Constitutional:      General: He is not in acute distress.    Appearance: He is well-developed.  HENT:     Head: Normocephalic and atraumatic.     Nose: Nose normal.  Eyes:     General:        Right eye: No discharge.        Left eye: No discharge.  Neck:     Musculoskeletal:  Normal range of motion and neck supple.  Cardiovascular:     Rate and Rhythm: Normal rate and regular rhythm.     Heart sounds: No murmur.  Pulmonary:     Effort: Pulmonary effort is normal.     Breath sounds: Normal breath sounds.  Abdominal:     General: Bowel sounds are normal.     Palpations: Abdomen is soft.     Tenderness: There is no abdominal tenderness.  Skin:    General: Skin is warm and dry.  Neurological:     Mental Status: He is alert and oriented to person, place, and time.     BP 90/62 (BP Location: Left Arm, Patient Position: Sitting, Cuff Size: Normal)   Pulse 65   Temp 97.9 F (36.6 C) (Oral)   Resp 18   Wt 195 lb 3.2 oz (88.5 kg)   SpO2 95%   BMI 26.47 kg/m  Wt Readings from Last 3 Encounters:  06/02/19 195 lb 3.2 oz (88.5 kg)  11/25/18 199 lb 12.8 oz (90.6 kg)  05/21/18 191 lb (86.6 kg)    Diabetic Foot Exam - Simple   No data filed     Lab Results  Component Value Date   WBC 8.6 05/26/2019   HGB 14.6 05/26/2019   HCT 42.7 05/26/2019   PLT 205.0 05/26/2019   GLUCOSE 126 (H) 06/02/2019   CHOL 159 05/26/2019   TRIG 129.0 05/26/2019   HDL 67.30 05/26/2019   LDLCALC 66 05/26/2019   ALT 19 06/02/2019   AST 21 06/02/2019   NA 140 06/02/2019   K 4.8 06/02/2019   CL 100 06/02/2019   CREATININE 0.92 06/02/2019   BUN 12 06/02/2019   CO2 30 06/02/2019   TSH 1.05 05/26/2019   PSA 2.22 11/19/2018   HGBA1C 5.9 05/26/2019   MICROALBUR 0.7 10/19/2015    Lab Results  Component Value Date   TSH 1.05 05/26/2019   Lab Results  Component Value Date   WBC 8.6 05/26/2019   HGB 14.6 05/26/2019   HCT 42.7 05/26/2019   MCV 98.2 05/26/2019   PLT 205.0 05/26/2019   Lab Results  Component Value Date   NA 140 06/02/2019   K 4.8 06/02/2019   CO2 30 06/02/2019   GLUCOSE 126 (H) 06/02/2019   BUN 12 06/02/2019   CREATININE 0.92 06/02/2019   BILITOT 0.7 06/02/2019   ALKPHOS 49 06/02/2019   AST 21 06/02/2019   ALT 19 06/02/2019   PROT 7.2  06/02/2019   ALBUMIN 4.7 06/02/2019   CALCIUM 9.9 06/02/2019   ANIONGAP 15 09/25/2014   GFR 82.03 06/02/2019   Lab Results  Component Value Date   CHOL 159 05/26/2019   Lab Results  Component Value Date   HDL 67.30 05/26/2019   Lab Results  Component Value Date   LDLCALC 66 05/26/2019   Lab Results  Component Value Date   TRIG 129.0 05/26/2019   Lab Results  Component Value Date   CHOLHDL 2 05/26/2019   Lab Results  Component Value Date   HGBA1C 5.9 05/26/2019       Assessment & Plan:   Problem List Items Addressed This Visit    Hyperlipidemia, mixed    Tolerating statin, encouraged heart healthy diet, avoid trans fats, minimize simple carbs and saturated fats. Increase exercise as tolerated      ERECTILE DYSFUNCTION   Essential hypertension    Well controlled, no changes to meds. Encouraged heart healthy diet such as the DASH diet and exercise as tolerated.       Hyperglycemia    hgba1c acceptable, minimize simple carbs. Increase exercise as tolerated. Continue current meds      Educated About Covid-19 Virus Infection    His family was notably ill back in the spring. He is tested for  Covid antibodies. He is encouraged to take vitamin C and Zinc, wear masks and hand wash.       Other Visit Diagnoses    Hypokalemia    -  Primary   Relevant Orders   Comprehensive metabolic panel (Completed)   Magnesium (Completed)   COVID-19       Relevant Orders   SAR CoV2 Serology (COVID 19)AB(IGG)IA (Completed)      I have discontinued [name] "Ken"'s scopolamine. I am also having him maintain his multivitamin, aspirin, sildenafil, tamsulosin, metoprolol succinate, simvastatin, omeprazole, quinapril-hydrochlorothiazide, amLODipine, and potassium chloride SA.  No orders of the defined types were placed in this encounter.    Penni Homans, MD

## 2019-06-05 NOTE — Assessment & Plan Note (Signed)
Tolerating statin, encouraged heart healthy diet, avoid trans fats, minimize simple carbs and saturated fats. Increase exercise as tolerated 

## 2019-06-05 NOTE — Assessment & Plan Note (Signed)
His family was notably ill back in the spring. He is tested for Covid antibodies. He is encouraged to take vitamin C and Zinc, wear masks and hand wash.

## 2019-07-20 DIAGNOSIS — D3131 Benign neoplasm of right choroid: Secondary | ICD-10-CM | POA: Diagnosis not present

## 2019-07-20 DIAGNOSIS — H43813 Vitreous degeneration, bilateral: Secondary | ICD-10-CM | POA: Diagnosis not present

## 2019-07-20 DIAGNOSIS — H524 Presbyopia: Secondary | ICD-10-CM | POA: Diagnosis not present

## 2019-07-20 DIAGNOSIS — H2513 Age-related nuclear cataract, bilateral: Secondary | ICD-10-CM | POA: Diagnosis not present

## 2019-07-21 ENCOUNTER — Other Ambulatory Visit: Payer: Self-pay

## 2019-07-21 NOTE — Patient Outreach (Signed)
[facility]) Care Management  07/21/2019  William Phillips 29-Dec-1950 [id number]   Medication Adherence call to William Phillips HIPPA Compliant Voice message left with a call back number. William Phillips is showing past due on Quanapril/ Hctz 20/12.5 mg under [place].   [facility] Management Direct Dial [phone]  Fax [phone] [email or web address]

## 2019-07-25 DIAGNOSIS — X32XXXS Exposure to sunlight, sequela: Secondary | ICD-10-CM | POA: Diagnosis not present

## 2019-07-25 DIAGNOSIS — L821 Other seborrheic keratosis: Secondary | ICD-10-CM | POA: Diagnosis not present

## 2019-07-25 DIAGNOSIS — C44619 Basal cell carcinoma of skin of left upper limb, including shoulder: Secondary | ICD-10-CM | POA: Diagnosis not present

## 2019-07-25 DIAGNOSIS — L814 Other melanin hyperpigmentation: Secondary | ICD-10-CM | POA: Diagnosis not present

## 2019-08-15 ENCOUNTER — Encounter: Payer: Self-pay | Admitting: Family Medicine

## 2019-08-15 ENCOUNTER — Telehealth: Payer: Self-pay

## 2019-08-15 MED ORDER — SILDENAFIL CITRATE 20 MG PO TABS: 20.0000 mg | ORAL_TABLET | Freq: Every day | ORAL | 1 refills | Status: DC | PRN

## 2019-08-15 NOTE — Telephone Encounter (Signed)
He might have to consider taking the Quinipril and the hct separately that might make it easier to find. I have sent in the [facility]

## 2019-08-15 NOTE — Telephone Encounter (Signed)
Copied from [place] [id number]. Topic: Quick Communication - See Telephone Encounter >> Aug 15, 2019  9:56 AM Loma Boston wrote: CRM for notification. See Telephone encounter for: 08/15/19.quinapril-hydrochlorothiazide (ACCURETIC) 20-12.5 MG tablet [phone] pt left MyChart message for Dr B and is requesting a FU asap if possible.

## 2019-08-16 MED ORDER — QUINAPRIL-HYDROCHLOROTHIAZIDE 20-12.5 MG PO TABS: 1.0000 | ORAL_TABLET | Freq: Every day | ORAL | 3 refills | Status: DC

## 2019-08-16 MED ORDER — SILDENAFIL CITRATE 20 MG PO TABS: 20.0000 mg | ORAL_TABLET | Freq: Every day | ORAL | 1 refills | Status: DC | PRN

## 2019-08-16 NOTE — Addendum Note (Signed)
Addended by: Magdalene Molly A on: 08/16/2019 09:20 AM   Modules accepted: Orders

## 2019-10-01 ENCOUNTER — Other Ambulatory Visit: Payer: Self-pay | Admitting: Family Medicine

## 2019-11-04 DIAGNOSIS — M5431 Sciatica, right side: Secondary | ICD-10-CM | POA: Diagnosis not present

## 2019-11-04 DIAGNOSIS — M9903 Segmental and somatic dysfunction of lumbar region: Secondary | ICD-10-CM | POA: Diagnosis not present

## 2019-11-04 DIAGNOSIS — M9904 Segmental and somatic dysfunction of sacral region: Secondary | ICD-10-CM | POA: Diagnosis not present

## 2019-11-04 DIAGNOSIS — M9902 Segmental and somatic dysfunction of thoracic region: Secondary | ICD-10-CM | POA: Diagnosis not present

## 2019-11-04 DIAGNOSIS — M5137 Other intervertebral disc degeneration, lumbosacral region: Secondary | ICD-10-CM | POA: Diagnosis not present

## 2019-11-08 DIAGNOSIS — M9904 Segmental and somatic dysfunction of sacral region: Secondary | ICD-10-CM | POA: Diagnosis not present

## 2019-11-08 DIAGNOSIS — M5431 Sciatica, right side: Secondary | ICD-10-CM | POA: Diagnosis not present

## 2019-11-08 DIAGNOSIS — M5137 Other intervertebral disc degeneration, lumbosacral region: Secondary | ICD-10-CM | POA: Diagnosis not present

## 2019-11-08 DIAGNOSIS — M9902 Segmental and somatic dysfunction of thoracic region: Secondary | ICD-10-CM | POA: Diagnosis not present

## 2019-11-08 DIAGNOSIS — M9903 Segmental and somatic dysfunction of lumbar region: Secondary | ICD-10-CM | POA: Diagnosis not present

## 2019-11-27 NOTE — Progress Notes (Addendum)
Virtual Visit via Audio Note  I connected with patient on 11/29/19 at  8:45 AM EST by audio enabled telemedicine application and verified that I am speaking with the correct person using two identifiers.   THIS ENCOUNTER IS A VIRTUAL VISIT DUE TO COVID-19 - PATIENT WAS NOT SEEN IN THE OFFICE. PATIENT HAS CONSENTED TO VIRTUAL VISIT / TELEMEDICINE VISIT   Location of patient: home  Location of provider: office  I discussed the limitations of evaluation and management by telemedicine and the availability of in person appointments. The patient expressed understanding and agreed to proceed.   Subjective:   William Phillips is a 69 y.o. male who presents for Medicare Annual/Subsequent preventive examination.  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 Enjoys playing golf a few times per month.   Review of Systems: Home Safety/Smoke Alarms: Feels safe in home. Smoke alarms in place.  Lives w/ wife in 2 story home. No trouble w/ stairs.  Male:   CCS- 08/22/10.  PSA-  Lab Results  Component Value Date   PSA 2.22 11/19/2018   PSA 1.63 10/19/2015   PSA 1.59 04/28/2013       Objective:    Vitals: Unable to assess. This visit is enabled though telemedicine due to Covid 19.   Advanced Directives 11/29/2019 11/24/2014 09/25/2014  Does Patient Have a Medical Advance Directive? Yes Yes No  Type of Paramedic of [place];Living will [place];Living will -  Does patient want to make changes to medical advance directive? No - Patient declined No - Patient declined -  Copy of [place] in Chart? No - copy requested - -  Would patient like information on creating a medical advance directive? - - No - patient declined information    Tobacco Social History   Tobacco Use  Smoking Status Former Smoker  Smokeless Tobacco Never Used  Tobacco Comment   5-10 pack year history     Counseling given:  Not Answered Comment: 5-10 pack year history   Clinical Intake: Pain : No/denies pain     Past Medical History:  Diagnosis Date  . Allergic rhinitis   . Ankle fracture 1998  . Atrial flutter ([place])    a. s/p CTI ablation by Dr Rayann Heman 11/2014  . Back pain 08/05/2015  . BPH (benign prostatic hyperplasia) 08/26/2015  . Ganglion cyst of dorsum of right wrist 04/08/2017  . GERD (gastroesophageal reflux disease)   . Hammer toe of right foot 05/06/2013  . History of carpal tunnel syndrome   . Hyperlipidemia   . Hypertension   . Insect bite 04/23/2015  . OA (osteoarthritis) 02/06/2013   knees  . Pedal edema 04/08/2017  . Preventative health care 04/27/2011   Had normal colonoscopy in 2011    Past Surgical History:  Procedure Laterality Date  . ABLATION  11-24-14   CTI ablation by Dr Rayann Heman  . ANKLE SURGERY    . ATRIAL FLUTTER ABLATION N/A 11/24/2014   Procedure: ATRIAL FLUTTER ABLATION;  Surgeon: Thompson Grayer, MD;  Location: [facility] CATH LAB;  Service: Cardiovascular;  Laterality: N/A;  . Carpel tunnel    . COLONOSCOPY  08/04/2002   Normal Exam- Dr Henrene Pastor  . INGUINAL HERNIA REPAIR  2008  . TONSILLECTOMY     Family History  Problem Relation Age of Onset  . Coronary artery disease Father        aortic valve disease  . Heart attack Father 61  .  COPD Father   . Hypertension Father   . Hyperlipidemia Father   . Dementia Father   . Diabetes type II Maternal Grandfather   . Diabetes Maternal Grandfather   . Arthritis Mother   . Stroke Mother   . Diabetes Sister   . Hyperlipidemia Other   . Hypertension Other    Social History   Socioeconomic History  . Marital status: Married    Spouse name: Not on file  . Number of children: 1  . Years of education: Not on file  . Highest education level: Not on file  Occupational History    Comment: Sales  Tobacco Use  . Smoking status: Former Research scientist (life sciences)  . Smokeless tobacco: Never Used  . Tobacco comment: 5-10 pack year history  Substance  and Sexual Activity  . Alcohol use: Yes    Comment: 2-3 glasses of wine per day  . Drug use: No  . Sexual activity: Yes  Other Topics Concern  . 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 Difficulty of Paying Living Expenses: Not hard at all  Food Insecurity:   . Worried About Charity fundraiser in the Last Year: Not on file  . Ran Out of Food in the Last Year: Not on file  Transportation Needs: No Transportation Needs  . Lack of Transportation (Medical): No  . Lack of Transportation (Non-Medical): No  Physical Activity:   . Days of Exercise per Week: Not on file  . Minutes of Exercise per Session: Not on file  Stress:   . Feeling of Stress : Not on file  Social Connections:   . Frequency of Communication with Friends and Family: Not on file  . Frequency of Social Gatherings with Friends and Family: Not on file  . Attends Religious Services: Not on file  . Active Member of Clubs or Organizations: Not on file  . Attends Archivist Meetings: Not on file  . Marital Status: Not on file    Outpatient Encounter Medications as of 11/29/2019  Medication Sig  . amLODipine (NORVASC) 10 MG tablet TAKE ONE-HALF TABLET BY  MOUTH TWO TIMES DAILY  . aspirin 81 MG tablet Take 81 mg by mouth daily.  . metoprolol succinate (TOPROL-XL) 100 MG 24 hr tablet TAKE 1 TABLET BY MOUTH  DAILY WITH OR IMMEDIATELY  FOLLOWING A MEAL  . Multiple Vitamin (MULTIVITAMIN) tablet Take 1 tablet by mouth daily.    Marland Kitchen omeprazole (PRILOSEC) 20 MG capsule TAKE 1 CAPSULE BY MOUTH  DAILY  . potassium chloride SA (K-DUR) 20 MEQ tablet Take 1 tablet by mouth daily x5 days a week  . quinapril-hydrochlorothiazide (ACCURETIC)  20-12.5 MG tablet Take 1 tablet by mouth daily.  . sildenafil (REVATIO) 20 MG tablet Take 1-4 tablets (20-80 mg total) by mouth daily as needed.  . simvastatin (ZOCOR) 20 MG tablet TAKE 1 TABLET BY MOUTH AT  BEDTIME  . tamsulosin (FLOMAX) 0.4 MG CAPS capsule TAKE 1 CAPSULE BY MOUTH  DAILY   No facility-administered encounter medications on file as of 11/29/2019.    Activities of Daily Living In your present state of health, do you have any  difficulty performing the following activities: 11/29/2019  Hearing? N  Vision? N  Difficulty concentrating or making decisions? N  Walking or climbing stairs? N  Dressing or bathing? N  Doing errands, shopping? N  Preparing Food and eating ? N  Using the Toilet? N  In the past six months, have you accidently leaked urine? N  Do you have problems with loss of bowel control? N  Managing your Medications? N  Managing your Finances? N  Housekeeping or managing your Housekeeping? N  Some recent data might be hidden    Patient Care Team: Mosie Lukes, MD as PCP - General (Family Medicine)   Assessment:   This is a routine wellness examination for William Phillips. Physical assessment deferred to PCP.   Exercise Activities and Dietary recommendations Current Exercise Habits: Home exercise routine, Type of exercise: walking, Time (Minutes): 45, Frequency (Times/Week): 3, Weekly Exercise (Minutes/Week): 135, Exercise limited by: None identified Diet (meal preparation, eat out, water intake, caffeinated beverages, dairy products, fruits and vegetables): 24 hr recall Breakfast: cheerios, juice Lunch: slim fast Dinner:  Chicken and salad and vegetable.  Goals    . Maintain healthy active lifestyle.       Fall Risk Fall Risk  11/29/2019 11/05/2017 11/05/2017  Falls in the past year? 0 No No  Number falls in past yr: 0 - -  Injury with Fall? 0 - -  Follow up Education provided;Falls prevention discussed - -   Depression Screen PHQ 2/9 Scores 11/29/2019  11/05/2017  PHQ - 2 Score 0 0    Cognitive Function Ad8 score reviewed for issues:  Issues making decisions:no  Less interest in hobbies / activities:no  Repeats questions, stories (family complaining):no  Trouble using ordinary gadgets (microwave, computer, phone):no  Forgets the month or year: no  Mismanaging finances: no  Remembering appts:no  Daily problems with thinking and/or memory:no Ad8 score is=0        Immunization History  Administered Date(s) Administered  . Influenza Whole 07/25/2009  . Influenza, High Dose Seasonal PF 07/29/2017  . Influenza,inj,Quad PF,6+ Mos 07/24/2015, 07/18/2016  . Influenza-Unspecified 08/13/2018  . Pneumococcal Conjugate-13 11/02/2013  . Pneumococcal Polysaccharide-23 11/05/2017  . Td 01/07/2005  . Tdap 08/24/2015  . Zoster 02/04/2013    Screening Tests Health Maintenance  Topic Date Due  . INFLUENZA VACCINE  05/14/2019  . COLONOSCOPY  08/22/2020  . TETANUS/TDAP  08/23/2025  . Hepatitis C Screening  Completed  . PNA vac Low Risk Adult  Completed     Plan:    Please schedule your next medicare wellness visit with me in 1 yr.  Continue to eat heart healthy diet (full of fruits, vegetables, whole grains, lean protein, water--limit salt, fat, and sugar intake) and increase physical activity as tolerated.  Continue doing brain stimulating activities (puzzles, reading, adult coloring books, staying active) to keep memory sharp.   Bring a copy of your living will and/or healthcare power of attorney to your next office visit.    I have personally reviewed and noted the following in the patient's chart:   . Medical and social history . Use of alcohol, tobacco or illicit drugs  . Current medications and supplements . Functional ability and status . Nutritional status . Physical activity . Advanced directives . List of other physicians . Hospitalizations, surgeries, and ER visits in previous 12 months . Vitals .  Screenings to include cognitive, depression, and falls . Referrals and appointments  In addition, I have reviewed and discussed with patient certain  preventive protocols, quality metrics, and best practice recommendations. A written personalized care plan for preventive services as well as general preventive health recommendations were provided to patient.     Shela Nevin, [place]  11/29/2019

## 2019-11-29 ENCOUNTER — Encounter: Payer: Self-pay | Admitting: *Deleted

## 2019-11-29 ENCOUNTER — Ambulatory Visit: Payer: Medicare Other | Admitting: *Deleted

## 2019-11-29 ENCOUNTER — Other Ambulatory Visit: Payer: Self-pay

## 2019-11-29 DIAGNOSIS — Z0000 Encounter for general adult medical examination without abnormal findings: Secondary | ICD-10-CM

## 2019-11-29 NOTE — Patient Instructions (Signed)
Please schedule your next medicare wellness visit with me in 1 yr.  Continue to eat heart healthy diet (full of fruits, vegetables, whole grains, lean protein, water--limit salt, fat, and sugar intake) and increase physical activity as tolerated.  Continue doing brain stimulating activities (puzzles, reading, adult coloring books, staying active) to keep memory sharp.   Bring a copy of your living will and/or healthcare power of attorney to your next office visit.   William Phillips , Thank you for taking time to come for your Medicare Wellness Visit. I appreciate your ongoing commitment to your health goals. Please review the following plan we discussed and let me know if I can assist you in the future.   These are the goals we discussed: Goals    . Maintain healthy active lifestyle.       This is a list of the screening recommended for you and due dates:  Health Maintenance  Topic Date Due  . Flu Shot  05/14/2019  . Colon Cancer Screening  08/22/2020  . Tetanus Vaccine  08/23/2025  .  Hepatitis C: One time screening is recommended by Center for Disease Control  (CDC) for  adults born from [birth year] through 1964.   Completed  . Pneumonia vaccines  Completed    Preventive Care 55 Years and Older, Male Preventive care refers to lifestyle choices and visits with your health care provider that can promote health and wellness. This includes:  A yearly physical exam. This is also called an annual well check.  Regular dental and eye exams.  Immunizations.  Screening for certain conditions.  Healthy lifestyle choices, such as diet and exercise. What can I expect for my preventive care visit? Physical exam Your health care provider will check:  Height and weight. These may be used to calculate body mass index (BMI), which is a measurement that tells if you are at a healthy weight.  Heart rate and blood pressure.  Your skin for abnormal spots. Counseling Your health care provider may  ask you questions about:  Alcohol, tobacco, and drug use.  Emotional well-being.  Home and relationship well-being.  Sexual activity.  Eating habits.  History of falls.  Memory and ability to understand (cognition).  Work and work Statistician. What immunizations do I need?  Influenza (flu) vaccine  This is recommended every year. Tetanus, diphtheria, and pertussis (Tdap) vaccine  You may need a Td booster every 10 years. Varicella (chickenpox) vaccine  You may need this vaccine if you have not already been vaccinated. Zoster (shingles) vaccine  You may need this after age 6. Pneumococcal conjugate (PCV13) vaccine  One dose is recommended after age 52. Pneumococcal polysaccharide (PPSV23) vaccine  One dose is recommended after age 52. Measles, mumps, and rubella (MMR) vaccine  You may need at least one dose of MMR if you were born in 1957 or later. You may also need a second dose. Meningococcal conjugate (MenACWY) vaccine  You may need this if you have certain conditions. Hepatitis A vaccine  You may need this if you have certain conditions or if you travel or work in places where you may be exposed to hepatitis A. Hepatitis B vaccine  You may need this if you have certain conditions or if you travel or work in places where you may be exposed to hepatitis B. Haemophilus influenzae type b (Hib) vaccine  You may need this if you have certain conditions. You may receive vaccines as individual doses or as more than one vaccine together  in one shot (combination vaccines). Talk with your health care provider about the risks and benefits of combination vaccines. What tests do I need? Blood tests  Lipid and cholesterol levels. These may be checked every 5 years, or more frequently depending on your overall health.  Hepatitis C test.  Hepatitis B test. Screening  Lung cancer screening. You may have this screening every year starting at age 64 if you have a  30-pack-year history of smoking and currently smoke or have quit within the past 15 years.  Colorectal cancer screening. All adults should have this screening starting at age 53 and continuing until age 78. Your health care provider may recommend screening at age 71 if you are at increased risk. You will have tests every 1-10 years, depending on your results and the type of screening test.  Prostate cancer screening. Recommendations will vary depending on your family history and other risks.  Diabetes screening. This is done by checking your blood sugar (glucose) after you have not eaten for a while (fasting). You may have this done every 1-3 years.  Abdominal aortic aneurysm (AAA) screening. You may need this if you are a current or former smoker.  Sexually transmitted disease (STD) testing. Follow these instructions at home: Eating and drinking  Eat a diet that includes fresh fruits and vegetables, whole grains, lean protein, and low-fat dairy products. Limit your intake of foods with high amounts of sugar, saturated fats, and salt.  Take vitamin and mineral supplements as recommended by your health care provider.  Do not drink alcohol if your health care provider tells you not to drink.  If you drink alcohol: ? Limit how much you have to 0-2 drinks a day. ? Be aware of how much alcohol is in your drink. In the [place] one drink equals one 12 oz bottle of beer (355 mL), one 5 oz glass of wine (148 mL), or one 1 oz glass of hard liquor (44 mL). Lifestyle  Take daily care of your teeth and gums.  Stay active. Exercise for at least 30 minutes on 5 or more days each week.  Do not use any products that contain nicotine or tobacco, such as cigarettes, e-cigarettes, and chewing tobacco. If you need help quitting, ask your health care provider.  If you are sexually active, practice safe sex. Use a condom or other form of protection to prevent STIs (sexually transmitted infections).  Talk  with your health care provider about taking a low-dose aspirin or statin. What's next?  Visit your health care provider once a year for a well check visit.  Ask your health care provider how often you should have your eyes and teeth checked.  Stay up to date on all vaccines. This information is not intended to replace advice given to you by your health care provider. Make sure you discuss any questions you have with your health care provider. Document Revised: 09/23/2018 Document Reviewed: 09/23/2018 Elsevier Patient Education  2020 [employer].

## 2019-12-01 ENCOUNTER — Encounter: Payer: Medicare Other | Admitting: Family Medicine

## 2019-12-05 ENCOUNTER — Encounter: Payer: Self-pay | Admitting: Internal Medicine

## 2019-12-05 ENCOUNTER — Other Ambulatory Visit: Payer: Self-pay

## 2019-12-05 ENCOUNTER — Ambulatory Visit (HOSPITAL_BASED_OUTPATIENT_CLINIC_OR_DEPARTMENT_OTHER)
Admission: RE | Admit: 2019-12-05 | Discharge: 2019-12-05 | Disposition: A | Discharge status: Home / Self Care | Payer: Medicare Other | Source: Ambulatory Visit | Attending: Internal Medicine | Admitting: Internal Medicine

## 2019-12-05 ENCOUNTER — Ambulatory Visit: Payer: Medicare Other | Admitting: Internal Medicine

## 2019-12-05 VITALS — BP 125/82 | HR 65 | Temp 96.5°F | Resp 16 | Ht 72.0 in | Wt 199.1 lb

## 2019-12-05 DIAGNOSIS — R5383 Other fatigue: Secondary | ICD-10-CM | POA: Diagnosis not present

## 2019-12-05 DIAGNOSIS — R05 Cough: Secondary | ICD-10-CM | POA: Diagnosis not present

## 2019-12-05 DIAGNOSIS — R059 Cough, unspecified: Secondary | ICD-10-CM

## 2019-12-05 LAB — CBC WITH DIFFERENTIAL/PLATELET
Basophils Absolute: 0 10*3/uL (ref 0.0–0.1)
Basophils Relative: 0.7 % (ref 0.0–3.0)
Eosinophils Absolute: 0.2 10*3/uL (ref 0.0–0.7)
Eosinophils Relative: 2 % (ref 0.0–5.0)
HCT: 45.2 % (ref 39.0–52.0)
Hemoglobin: 15.1 g/dL (ref 13.0–17.0)
Lymphocytes Relative: 21.8 % (ref 12.0–46.0)
Lymphs Abs: 1.6 10*3/uL (ref 0.7–4.0)
MCHC: 33.5 g/dL (ref 30.0–36.0)
MCV: 99 fl (ref 78.0–100.0)
Monocytes Absolute: 0.9 10*3/uL (ref 0.1–1.0)
Monocytes Relative: 12.3 % — ABNORMAL HIGH (ref 3.0–12.0)
Neutro Abs: 4.8 10*3/uL (ref 1.4–7.7)
Neutrophils Relative %: 63.2 % (ref 43.0–77.0)
Platelets: 209 10*3/uL (ref 150.0–400.0)
RBC: 4.57 Mil/uL (ref 4.22–5.81)
RDW: 13 % (ref 11.5–15.5)
WBC: 7.6 10*3/uL (ref 4.0–10.5)

## 2019-12-05 LAB — SARS-COV-2 IGG: SARS-COV-2 IgG: 0.51

## 2019-12-05 NOTE — Patient Instructions (Signed)
GO TO THE LAB : Get the blood work     GO TO [place] back for a checkup in 1 month  STOP BY THE FIRST FLOOR:  get the XR

## 2019-12-05 NOTE — Progress Notes (Signed)
Pre visit review using our clinic review tool, if applicable. No additional management support is needed unless otherwise documented below in the visit note. 

## 2019-12-05 NOTE — Progress Notes (Signed)
Subjective:    Patient ID: William Phillips, male    DOB: [birth year], 69 y.o.   MRN: [id number]  DOS:  12/05/2019 Type of visit - description: Acute Developed sinus congestion on mid December 2020: sinus pain- discharge, blowing his nose. Also developed chest congestion mild mid January 2021 along with some cough minimal sputum production.  Took Mucinex. Currently overall he feels better, chest congestion has decreased but he is not feeling 100%. Reports simply fatigue, lack of energy. He takes 3-4 walks every week around the neighborhood and he has not noted any DOE.  Review of Systems Denies fever chills No weight loss No myalgias No chest pain, difficulty breathing, lower extremity edema or palpitations No nausea, vomiting, diarrhea or blood in the stools. He suspect he has a mild, chronic snoring.  Past Medical History:  Diagnosis Date  . Allergic rhinitis   . Ankle fracture 1998  . Atrial flutter ([facility])    a. s/p CTI ablation by Dr Rayann Heman 11/2014  . Back pain 08/05/2015  . BPH (benign prostatic hyperplasia) 08/26/2015  . Ganglion cyst of dorsum of right wrist 04/08/2017  . GERD (gastroesophageal reflux disease)   . Hammer toe of right foot 05/06/2013  . History of carpal tunnel syndrome   . Hyperlipidemia   . Hypertension   . Insect bite 04/23/2015  . OA (osteoarthritis) 02/06/2013   knees  . Pedal edema 04/08/2017  . Preventative health care 04/27/2011   Had normal colonoscopy in 2011     Past Surgical History:  Procedure Laterality Date  . ABLATION  11-24-14   CTI ablation by Dr Rayann Heman  . ANKLE SURGERY    . ATRIAL FLUTTER ABLATION N/A 11/24/2014   Procedure: ATRIAL FLUTTER ABLATION;  Surgeon: Thompson Grayer, MD;  Location: [facility] CATH LAB;  Service: Cardiovascular;  Laterality: N/A;  . Carpel tunnel    . COLONOSCOPY  08/04/2002   Normal Exam- Dr Henrene Pastor  . INGUINAL HERNIA REPAIR  2008  . TONSILLECTOMY      Allergies as of 12/05/2019      Reactions   Penicillins    REACTION: swelling , irriation      Medication List       Accurate as of December 05, 2019 11:59 PM. If you have any questions, ask your nurse or doctor.        amLODipine 10 MG tablet Commonly known as: NORVASC TAKE ONE-HALF TABLET BY  MOUTH TWO TIMES DAILY   aspirin 81 MG tablet Take 81 mg by mouth daily.   metoprolol succinate 100 MG 24 hr tablet Commonly known as: TOPROL-XL TAKE 1 TABLET BY MOUTH  DAILY WITH OR IMMEDIATELY  FOLLOWING A MEAL   multivitamin tablet Take 1 tablet by mouth daily.   omeprazole 20 MG capsule Commonly known as: PRILOSEC TAKE 1 CAPSULE BY MOUTH  DAILY   potassium chloride SA 20 MEQ tablet Commonly known as: KLOR-CON Take 1 tablet by mouth daily x5 days a week   quinapril-hydrochlorothiazide 20-12.5 MG tablet Commonly known as: ACCURETIC Take 1 tablet by mouth daily.   sildenafil 20 MG tablet Commonly known as: REVATIO Take 1-4 tablets (20-80 mg total) by mouth daily as needed.   simvastatin 20 MG tablet Commonly known as: ZOCOR TAKE 1 TABLET BY MOUTH AT  BEDTIME   tamsulosin 0.4 MG Caps capsule Commonly known as: FLOMAX TAKE 1 CAPSULE BY MOUTH  DAILY             Objective:   Physical Exam BP  125/82 (BP Location: Right Arm, Patient Position: Sitting, Cuff Size: Normal)   Pulse 65   Temp (!) 96.5 F (35.8 C) (Temporal)   Resp 16   Ht 6' (1.829 m)   Wt 199 lb 2 oz (90.3 kg)   SpO2 99%   BMI 27.01 kg/m  General:   Well developed, NAD, BMI noted.  HEENT:  Normocephalic . Face symmetric, atraumatic Neck: No thyromegaly Lungs:  CTA B Normal respiratory effort, no intercostal retractions, no accessory muscle use. Heart: RRR,  no murmur.  Abdomen:  Not distended, soft, non-tender. No rebound or rigidity.   Skin: Not pale. Not jaundice Lower extremities: no pretibial edema bilaterally  Neurologic:  alert & oriented X3.  Speech normal, gait appropriate for age and unassisted Psych--  Cognition and judgment appear  intact.  Cooperative with normal attention span and concentration.  Behavior appropriate. No anxious or depressed appearing.     Assessment    69 year old male, PMH includes high cholesterol, BPH, h/o SVT, a flutter, not anticoagulated, presents with  Fatigue, mild cough The patient developed respiratory symptoms approximately 2 months ago, he never had fever but had sinus congestion , chest congestion, cough. No chest pain, no DOE. Current symptom are  mostly fatigue and minimal cough. He could have a post viral syndrome,  doubt active infection.  O2 sat 99% at rest, 97% with exertion Plan: Chest x-ray, CMP, CBC, Covid IgG   Further advised with results  This visit occurred during the SARS-CoV-2 public health emergency.  Safety protocols were in place, including screening questions prior to the visit, additional usage of staff PPE, and extensive cleaning of exam room while observing appropriate contact time as indicated for disinfecting solutions.

## 2019-12-06 LAB — COMPREHENSIVE METABOLIC PANEL
ALT: 22 U/L (ref 0–53)
AST: 24 U/L (ref 0–37)
Albumin: 4.4 g/dL (ref 3.5–5.2)
Alkaline Phosphatase: 53 U/L (ref 39–117)
BUN: 11 mg/dL (ref 6–23)
CO2: 30 mEq/L (ref 19–32)
Calcium: 9.7 mg/dL (ref 8.4–10.5)
Chloride: 100 mEq/L (ref 96–112)
Creatinine, Ser: 0.87 mg/dL (ref 0.40–1.50)
GFR: 87.36 mL/min (ref >60.00)
Glucose, Bld: 120 mg/dL — ABNORMAL HIGH (ref 70–99)
Potassium: 4.5 mEq/L (ref 3.5–5.1)
Sodium: 138 mEq/L (ref 135–145)
Total Bilirubin: 0.6 mg/dL (ref 0.2–1.2)
Total Protein: 6.9 g/dL (ref 6.0–8.3)

## 2019-12-08 ENCOUNTER — Ambulatory Visit: Payer: Medicare Other | Admitting: Family Medicine

## 2019-12-30 ENCOUNTER — Ambulatory Visit: Payer: Medicare Other | Admitting: Internal Medicine

## 2020-01-24 ENCOUNTER — Encounter: Payer: Medicare Other | Admitting: Family Medicine

## 2020-02-06 DIAGNOSIS — M71341 Other bursal cyst, right hand: Secondary | ICD-10-CM | POA: Diagnosis not present

## 2020-02-06 DIAGNOSIS — L218 Other seborrheic dermatitis: Secondary | ICD-10-CM | POA: Diagnosis not present

## 2020-02-06 DIAGNOSIS — C44311 Basal cell carcinoma of skin of nose: Secondary | ICD-10-CM | POA: Diagnosis not present

## 2020-02-06 DIAGNOSIS — D485 Neoplasm of uncertain behavior of skin: Secondary | ICD-10-CM | POA: Diagnosis not present

## 2020-02-16 ENCOUNTER — Encounter: Payer: Self-pay | Admitting: Family Medicine

## 2020-02-16 ENCOUNTER — Ambulatory Visit (INDEPENDENT_AMBULATORY_CARE_PROVIDER_SITE_OTHER): Payer: Medicare Other | Admitting: Family Medicine

## 2020-02-16 ENCOUNTER — Other Ambulatory Visit: Payer: Self-pay

## 2020-02-16 DIAGNOSIS — C4491 Basal cell carcinoma of skin, unspecified: Secondary | ICD-10-CM

## 2020-02-16 DIAGNOSIS — E782 Mixed hyperlipidemia: Secondary | ICD-10-CM

## 2020-02-16 DIAGNOSIS — R7989 Other specified abnormal findings of blood chemistry: Secondary | ICD-10-CM | POA: Diagnosis not present

## 2020-02-16 DIAGNOSIS — R739 Hyperglycemia, unspecified: Secondary | ICD-10-CM | POA: Diagnosis not present

## 2020-02-16 DIAGNOSIS — I10 Essential (primary) hypertension: Secondary | ICD-10-CM | POA: Diagnosis not present

## 2020-02-16 DIAGNOSIS — Z0000 Encounter for general adult medical examination without abnormal findings: Secondary | ICD-10-CM | POA: Diagnosis not present

## 2020-02-16 LAB — LIPID PANEL
Cholesterol: 149 mg/dL (ref 0–200)
HDL: 54.4 mg/dL (ref >39.00)
LDL Cholesterol: 69 mg/dL (ref 0–99)
NonHDL: 94.98
Total CHOL/HDL Ratio: 3
Triglycerides: 132 mg/dL (ref 0.0–149.0)
VLDL: 26.4 mg/dL (ref 0.0–40.0)

## 2020-02-16 LAB — COMPREHENSIVE METABOLIC PANEL
ALT: 22 U/L (ref 0–53)
AST: 21 U/L (ref 0–37)
Albumin: 4.3 g/dL (ref 3.5–5.2)
Alkaline Phosphatase: 45 U/L (ref 39–117)
BUN: 12 mg/dL (ref 6–23)
CO2: 29 mEq/L (ref 19–32)
Calcium: 9.2 mg/dL (ref 8.4–10.5)
Chloride: 100 mEq/L (ref 96–112)
Creatinine, Ser: 0.84 mg/dL (ref 0.40–1.50)
GFR: 90.91 mL/min (ref >60.00)
Glucose, Bld: 124 mg/dL — ABNORMAL HIGH (ref 70–99)
Potassium: 3.9 mEq/L (ref 3.5–5.1)
Sodium: 135 mEq/L (ref 135–145)
Total Bilirubin: 0.6 mg/dL (ref 0.2–1.2)
Total Protein: 7 g/dL (ref 6.0–8.3)

## 2020-02-16 LAB — CBC
HCT: 43.3 % (ref 39.0–52.0)
Hemoglobin: 14.9 g/dL (ref 13.0–17.0)
MCHC: 34.3 g/dL (ref 30.0–36.0)
MCV: 97.9 fl (ref 78.0–100.0)
Platelets: 202 10*3/uL (ref 150.0–400.0)
RBC: 4.42 Mil/uL (ref 4.22–5.81)
RDW: 12.3 % (ref 11.5–15.5)
WBC: 7.6 10*3/uL (ref 4.0–10.5)

## 2020-02-16 LAB — HEMOGLOBIN A1C: Hgb A1c MFr Bld: 5.9 % (ref 4.6–6.5)

## 2020-02-16 LAB — PSA: PSA: 2.33 ng/mL (ref 0.10–4.00)

## 2020-02-16 LAB — TSH: TSH: 1.14 u[IU]/mL (ref 0.35–4.50)

## 2020-02-16 NOTE — Assessment & Plan Note (Signed)
hgba1c acceptable, minimize simple carbs. Increase exercise as tolerated.  

## 2020-02-16 NOTE — Patient Instructions (Signed)
MIND diet (DASH and Mediterranean)  Omron Blood Pressure cuff, upper arm, want BP 100-140/60-90 Pulse oximeter, want oxygen in 90s  Weekly vitals  Take Multivitamin with minerals, selenium Vitamin D 1000-2000 IU daily Probiotic with lactobacillus and bifidophilus Asprin EC 81 mg daily Fish or Krill oil  Melatonin 2-5 mg at bedtime  [email or web address]   Preventive Care 65 Years and Older, Male Preventive care refers to lifestyle choices and visits with your health care provider that can promote health and wellness. This includes:  A yearly physical exam. This is also called an annual well check.  Regular dental and eye exams.  Immunizations.  Screening for certain conditions.  Healthy lifestyle choices, such as diet and exercise. What can I expect for my preventive care visit? Physical exam Your health care provider will check:  Height and weight. These may be used to calculate body mass index (BMI), which is a measurement that tells if you are at a healthy weight.  Heart rate and blood pressure.  Your skin for abnormal spots. Counseling Your health care provider may ask you questions about:  Alcohol, tobacco, and drug use.  Emotional well-being.  Home and relationship well-being.  Sexual activity.  Eating habits.  History of falls.  Memory and ability to understand (cognition).  Work and work Statistician. What immunizations do I need?  Influenza (flu) vaccine  This is recommended every year. Tetanus, diphtheria, and pertussis (Tdap) vaccine  You may need a Td booster every 10 years. Varicella (chickenpox) vaccine  You may need this vaccine if you have not already been vaccinated. Zoster (shingles) vaccine  You may need this after age 65. Pneumococcal conjugate (PCV13) vaccine  One dose is recommended after age 55. Pneumococcal polysaccharide (PPSV23) vaccine  One dose is recommended after age 51. Measles,  mumps, and rubella (MMR) vaccine  You may need at least one dose of MMR if you were born in 1957 or later. You may also need a second dose. Meningococcal conjugate (MenACWY) vaccine  You may need this if you have certain conditions. Hepatitis A vaccine  You may need this if you have certain conditions or if you travel or work in places where you may be exposed to hepatitis A. Hepatitis B vaccine  You may need this if you have certain conditions or if you travel or work in places where you may be exposed to hepatitis B. Haemophilus influenzae type b (Hib) vaccine  You may need this if you have certain conditions. You may receive vaccines as individual doses or as more than one vaccine together in one shot (combination vaccines). Talk with your health care provider about the risks and benefits of combination vaccines. What tests do I need? Blood tests  Lipid and cholesterol levels. These may be checked every 5 years, or more frequently depending on your overall health.  Hepatitis C test.  Hepatitis B test. Screening  Lung cancer screening. You may have this screening every year starting at age 33 if you have a 30-pack-year history of smoking and currently smoke or have quit within the past 15 years.  Colorectal cancer screening. All adults should have this screening starting at age 32 and continuing until age 38. Your health care provider may recommend screening at age 38 if you are at increased risk. You will have tests every 1-10 years, depending on your results and the type of screening test.  Prostate cancer screening. Recommendations will vary depending on your family history and other risks.  Diabetes screening.  This is done by checking your blood sugar (glucose) after you have not eaten for a while (fasting). You may have this done every 1-3 years.  Abdominal aortic aneurysm (AAA) screening. You may need this if you are a current or former smoker.  Sexually transmitted  disease (STD) testing. Follow these instructions at home: Eating and drinking  Eat a diet that includes fresh fruits and vegetables, whole grains, lean protein, and low-fat dairy products. Limit your intake of foods with high amounts of sugar, saturated fats, and salt.  Take vitamin and mineral supplements as recommended by your health care provider.  Do not drink alcohol if your health care provider tells you not to drink.  If you drink alcohol: ? Limit how much you have to 0-2 drinks a day. ? Be aware of how much alcohol is in your drink. In the [place] one drink equals one 12 oz bottle of beer (355 mL), one 5 oz glass of wine (148 mL), or one 1 oz glass of hard liquor (44 mL). Lifestyle  Take daily care of your teeth and gums.  Stay active. Exercise for at least 30 minutes on 5 or more days each week.  Do not use any products that contain nicotine or tobacco, such as cigarettes, e-cigarettes, and chewing tobacco. If you need help quitting, ask your health care provider.  If you are sexually active, practice safe sex. Use a condom or other form of protection to prevent STIs (sexually transmitted infections).  Talk with your health care provider about taking a low-dose aspirin or statin. What's next?  Visit your health care provider once a year for a well check visit.  Ask your health care provider how often you should have your eyes and teeth checked.  Stay up to date on all vaccines. This information is not intended to replace advice given to you by your health care provider. Make sure you discuss any questions you have with your health care provider. Document Revised: 09/23/2018 Document Reviewed: 09/23/2018 Elsevier Patient Education  2020 [employer].

## 2020-02-16 NOTE — Assessment & Plan Note (Signed)
Patient encouraged to maintain heart healthy diet, regular exercise, adequate sleep. Consider daily probiotics. Take medications as prescribed. Labs ordered and reviewed 

## 2020-02-16 NOTE — Assessment & Plan Note (Signed)
Tolerating statin, encouraged heart healthy diet, avoid trans fats, minimize simple carbs and saturated fats. Increase exercise as tolerated 

## 2020-02-16 NOTE — Assessment & Plan Note (Signed)
Well controlled, no changes to meds. Encouraged heart healthy diet such as the DASH diet and exercise as tolerated.  °

## 2020-02-16 NOTE — Assessment & Plan Note (Signed)
Check lab today

## 2020-02-16 NOTE — Assessment & Plan Note (Addendum)
Left forearm Right nose, Mohs 04/27/20  Sees Dr Tamala Julian

## 2020-02-19 NOTE — Progress Notes (Signed)
Subjective:    Patient ID: William Phillips, male    DOB: Jan 01, 1951, 69 y.o.   MRN: [id number]  Chief Complaint  Patient presents with  . Annual Exam    HPI Patient is in today for annual preventative exam. No recent febrile illness or hospitalizations. Continues to stay active and try and maintain a heart healthy diet. Has had both of his [place] shots and tolerated them well. Denies CP/palp/SOB/HA/congestion/fevers/GI or GU c/o. Taking meds as prescribed  Past Medical History:  Diagnosis Date  . Allergic rhinitis   . Ankle fracture 1998  . Atrial flutter ([place])    a. s/p CTI ablation by Dr Rayann Heman 11/2014  . Back pain 08/05/2015  . BPH (benign prostatic hyperplasia) 08/26/2015  . Ganglion cyst of dorsum of right wrist 04/08/2017  . GERD (gastroesophageal reflux disease)   . Hammer toe of right foot 05/06/2013  . History of carpal tunnel syndrome   . Hyperlipidemia   . Hypertension   . Insect bite 04/23/2015  . OA (osteoarthritis) 02/06/2013   knees  . Pedal edema 04/08/2017  . Preventative health care 04/27/2011   Had normal colonoscopy in 2011     Past Surgical History:  Procedure Laterality Date  . ABLATION  11-24-14   CTI ablation by Dr Rayann Heman  . ANKLE SURGERY    . ATRIAL FLUTTER ABLATION N/A 11/24/2014   Procedure: ATRIAL FLUTTER ABLATION;  Surgeon: Thompson Grayer, MD;  Location: [facility] CATH LAB;  Service: Cardiovascular;  Laterality: N/A;  . Carpel tunnel    . COLONOSCOPY  08/04/2002   Normal Exam- Dr Henrene Pastor  . INGUINAL HERNIA REPAIR  2008  . TONSILLECTOMY      Family History  Problem Relation Age of Onset  . Coronary artery disease Father        aortic valve disease  . Heart attack Father 46  . COPD Father   . Hypertension Father   . Hyperlipidemia Father   . Dementia Father   . Diabetes type II Maternal Grandfather   . Diabetes Maternal Grandfather   . Arthritis Mother   . Stroke Mother   . Diabetes Sister   . Hyperlipidemia Other   . Hypertension Other      Social History   Socioeconomic History  . Marital status: Married    Spouse name: Not on file  . Number of children: 1  . Years of education: Not on file  . Highest education level: Not on file  Occupational History    Comment: Sales  Tobacco Use  . Smoking status: Former Research scientist (life sciences)  . Smokeless tobacco: Never Used  . Tobacco comment: 5-10 pack year history  Substance and Sexual Activity  . Alcohol use: Yes    Comment: 2-3 glasses of wine per day  . Drug use: No  . Sexual activity: Yes  Other Topics Concern  . 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 Difficulty of Paying Living Expenses: Not hard at all  Food Insecurity:   . Worried About Charity fundraiser in the Last Year:   . Arboriculturist in the Last Year:   [employer]  Needs: No Transportation Needs  . Lack of Transportation (Medical): No  . Lack of Transportation (Non-Medical): No  Physical Activity:   . Days of Exercise per Week:   . Minutes of Exercise per Session:   Stress:   . Feeling of Stress :   Social Connections:   . Frequency of Communication with Friends and Family:   . Frequency of Social Gatherings with Friends and Family:   . Attends Religious Services:   . Active Member of Clubs or Organizations:   . Attends Archivist Meetings:   Marland Kitchen Marital Status:   Intimate Partner Violence:   . Fear of Current or Ex-Partner:   . Emotionally Abused:   Marland Kitchen Physically Abused:   . Sexually Abused:     Outpatient Medications Prior to Visit  Medication Sig Dispense Refill  . amLODipine (NORVASC) 10 MG tablet TAKE ONE-HALF TABLET BY  MOUTH TWO TIMES DAILY 90 tablet 3  . aspirin 81 MG tablet Take 81 mg by  mouth daily.    . metoprolol succinate (TOPROL-XL) 100 MG 24 hr tablet TAKE 1 TABLET BY MOUTH  DAILY WITH OR IMMEDIATELY  FOLLOWING A MEAL 90 tablet 3  . Multiple Vitamin (MULTIVITAMIN) tablet Take 1 tablet by mouth daily.      Marland Kitchen omeprazole (PRILOSEC) 20 MG capsule TAKE 1 CAPSULE BY MOUTH  DAILY 90 capsule 3  . quinapril-hydrochlorothiazide (ACCURETIC) 20-12.5 MG tablet Take 1 tablet by mouth daily. 90 tablet 3  . sildenafil (REVATIO) 20 MG tablet Take 1-4 tablets (20-80 mg total) by mouth daily as needed. 90 tablet 1  . simvastatin (ZOCOR) 20 MG tablet TAKE 1 TABLET BY MOUTH AT  BEDTIME 90 tablet 3  . tamsulosin (FLOMAX) 0.4 MG CAPS capsule TAKE 1 CAPSULE BY MOUTH  DAILY 90 capsule 3  . potassium chloride SA (K-DUR) 20 MEQ tablet Take 1 tablet by mouth daily x5 days a week (Patient not taking: Reported on 12/05/2019) 30 tablet 3   No facility-administered medications prior to visit.    Allergies  Allergen Reactions  . Penicillins     REACTION: swelling , irriation    Review of Systems  Constitutional: Negative for fever and malaise/fatigue.  HENT: Negative for congestion, hearing loss and tinnitus.   Eyes: Negative for blurred vision and discharge.  Respiratory: Negative for cough and shortness of breath.   Cardiovascular: Negative for chest pain, palpitations and leg swelling.  Gastrointestinal: Negative for abdominal pain, blood in stool and nausea.  Genitourinary: Negative for dysuria and frequency.  Musculoskeletal: Negative for falls.  Skin: Negative for rash.  Neurological: Negative for dizziness, loss of consciousness and headaches.  Endo/Heme/Allergies: Negative for environmental allergies.  Psychiatric/Behavioral: Negative for depression. The patient is not nervous/anxious.        Objective:    Physical Exam Vitals and nursing note reviewed.  Constitutional:      General: He is not in acute distress.    Appearance: Normal appearance. He is well-developed. He is not  ill-appearing.  HENT:     Head: Normocephalic and atraumatic.     Right Ear: Tympanic membrane normal.     Left Ear: Tympanic membrane normal.  Eyes:     General:        Right eye: No discharge.        Left eye: No discharge.     Extraocular Movements: Extraocular movements intact.     Pupils: Pupils are equal, round, and reactive to light.  Cardiovascular:     Rate  and Rhythm: Normal rate and regular rhythm.     Heart sounds: No murmur.  Pulmonary:     Effort: Pulmonary effort is normal.     Breath sounds: Normal breath sounds.  Abdominal:     General: Bowel sounds are normal.     Palpations: Abdomen is soft. There is no mass.     Tenderness: There is no abdominal tenderness. There is no guarding.  Musculoskeletal:     Cervical back: Normal range of motion and neck supple.  Skin:    General: Skin is warm and dry.  Neurological:     General: No focal deficit present.     Mental Status: He is alert and oriented to person, place, and time.  Psychiatric:        Mood and Affect: Mood normal.        Behavior: Behavior normal.     BP 116/66 (BP Location: Left Arm, Cuff Size: Normal)   Pulse 68   Temp 98.4 F (36.9 C) (Temporal)   Resp 12   Ht 5' 10.5" (1.791 m)   Wt 199 lb 6.4 oz (90.4 kg)   SpO2 98%   BMI 28.21 kg/m  Wt Readings from Last 3 Encounters:  02/16/20 199 lb 6.4 oz (90.4 kg)  12/05/19 199 lb 2 oz (90.3 kg)  06/02/19 195 lb 3.2 oz (88.5 kg)    Diabetic Foot Exam - Simple   No data filed     Lab Results  Component Value Date   WBC 7.6 02/16/2020   HGB 14.9 02/16/2020   HCT 43.3 02/16/2020   PLT 202.0 02/16/2020   GLUCOSE 124 (H) 02/16/2020   CHOL 149 02/16/2020   TRIG 132.0 02/16/2020   HDL 54.40 02/16/2020   LDLCALC 69 02/16/2020   ALT 22 02/16/2020   AST 21 02/16/2020   NA 135 02/16/2020   K 3.9 02/16/2020   CL 100 02/16/2020   CREATININE 0.84 02/16/2020   BUN 12 02/16/2020   CO2 29 02/16/2020   TSH 1.14 02/16/2020   PSA 2.33 02/16/2020     HGBA1C 5.9 02/16/2020   MICROALBUR 0.7 10/19/2015    Lab Results  Component Value Date   TSH 1.14 02/16/2020   Lab Results  Component Value Date   WBC 7.6 02/16/2020   HGB 14.9 02/16/2020   HCT 43.3 02/16/2020   MCV 97.9 02/16/2020   PLT 202.0 02/16/2020   Lab Results  Component Value Date   NA 135 02/16/2020   K 3.9 02/16/2020   CO2 29 02/16/2020   GLUCOSE 124 (H) 02/16/2020   BUN 12 02/16/2020   CREATININE 0.84 02/16/2020   BILITOT 0.6 02/16/2020   ALKPHOS 45 02/16/2020   AST 21 02/16/2020   ALT 22 02/16/2020   PROT 7.0 02/16/2020   ALBUMIN 4.3 02/16/2020   CALCIUM 9.2 02/16/2020   ANIONGAP 15 09/25/2014   GFR 90.91 02/16/2020   Lab Results  Component Value Date   CHOL 149 02/16/2020   Lab Results  Component Value Date   HDL 54.40 02/16/2020   Lab Results  Component Value Date   LDLCALC 69 02/16/2020   Lab Results  Component Value Date   TRIG 132.0 02/16/2020   Lab Results  Component Value Date   CHOLHDL 3 02/16/2020   Lab Results  Component Value Date   HGBA1C 5.9 02/16/2020       Assessment & Plan:   Problem List Items Addressed This Visit    Hyperlipidemia, mixed    Tolerating  statin, encouraged heart healthy diet, avoid trans fats, minimize simple carbs and saturated fats. Increase exercise as tolerated      Relevant Orders   Lipid panel (Completed)   Essential hypertension    Well controlled, no changes to meds. Encouraged heart healthy diet such as the DASH diet and exercise as tolerated.       Relevant Orders   CBC (Completed)   Comprehensive metabolic panel (Completed)   Hyperglycemia    hgba1c acceptable, minimize simple carbs. Increase exercise as tolerated.       Relevant Orders   Hemoglobin A1c (Completed)   Abnormal TSH    Check lab today      Relevant Orders   TSH (Completed)   Preventative health care    Patient encouraged to maintain heart healthy diet, regular exercise, adequate sleep. Consider daily  probiotics. Take medications as prescribed. Labs ordered and reviewed      Relevant Orders   PSA (Completed)   BCC (basal cell carcinoma of skin)    Left forearm Right nose, Mohs 04/27/20  Sees Dr Tamala Julian         I have discontinued [name] "Ken"'s potassium chloride SA. I am also having him maintain his multivitamin, aspirin, amLODipine, sildenafil, quinapril-hydrochlorothiazide, simvastatin, tamsulosin, omeprazole, and metoprolol succinate.  No orders of the defined types were placed in this encounter.    Penni Homans, MD

## 2020-03-28 DIAGNOSIS — C44311 Basal cell carcinoma of skin of nose: Secondary | ICD-10-CM | POA: Diagnosis not present

## 2020-03-30 DIAGNOSIS — C44319 Basal cell carcinoma of skin of other parts of face: Secondary | ICD-10-CM | POA: Diagnosis not present

## 2020-04-01 ENCOUNTER — Other Ambulatory Visit: Payer: Self-pay | Admitting: Family Medicine

## 2020-07-07 ENCOUNTER — Other Ambulatory Visit: Payer: Self-pay | Admitting: Family Medicine

## 2020-07-25 DIAGNOSIS — D3131 Benign neoplasm of right choroid: Secondary | ICD-10-CM | POA: Diagnosis not present

## 2020-07-25 DIAGNOSIS — H524 Presbyopia: Secondary | ICD-10-CM | POA: Diagnosis not present

## 2020-07-25 DIAGNOSIS — H2513 Age-related nuclear cataract, bilateral: Secondary | ICD-10-CM | POA: Diagnosis not present

## 2020-07-25 DIAGNOSIS — H0100A Unspecified blepharitis right eye, upper and lower eyelids: Secondary | ICD-10-CM | POA: Diagnosis not present

## 2020-08-08 DIAGNOSIS — Z85828 Personal history of other malignant neoplasm of skin: Secondary | ICD-10-CM | POA: Diagnosis not present

## 2020-08-08 DIAGNOSIS — L814 Other melanin hyperpigmentation: Secondary | ICD-10-CM | POA: Diagnosis not present

## 2020-08-08 DIAGNOSIS — X32XXXS Exposure to sunlight, sequela: Secondary | ICD-10-CM | POA: Diagnosis not present

## 2020-08-08 DIAGNOSIS — D1801 Hemangioma of skin and subcutaneous tissue: Secondary | ICD-10-CM | POA: Diagnosis not present

## 2020-08-08 DIAGNOSIS — L218 Other seborrheic dermatitis: Secondary | ICD-10-CM | POA: Diagnosis not present

## 2020-08-20 ENCOUNTER — Ambulatory Visit: Payer: Medicare Other | Admitting: Family Medicine

## 2020-09-07 ENCOUNTER — Other Ambulatory Visit: Payer: Self-pay | Admitting: Family Medicine

## 2020-10-23 ENCOUNTER — Other Ambulatory Visit: Payer: Self-pay

## 2020-10-23 ENCOUNTER — Ambulatory Visit: Payer: Medicare Other | Attending: Internal Medicine

## 2020-10-23 ENCOUNTER — Ambulatory Visit (INDEPENDENT_AMBULATORY_CARE_PROVIDER_SITE_OTHER): Payer: Medicare Other | Admitting: Family Medicine

## 2020-10-23 ENCOUNTER — Other Ambulatory Visit (HOSPITAL_BASED_OUTPATIENT_CLINIC_OR_DEPARTMENT_OTHER): Payer: Self-pay | Admitting: Internal Medicine

## 2020-10-23 DIAGNOSIS — F528 Other sexual dysfunction not due to a substance or known physiological condition: Secondary | ICD-10-CM

## 2020-10-23 DIAGNOSIS — M19012 Primary osteoarthritis, left shoulder: Secondary | ICD-10-CM

## 2020-10-23 DIAGNOSIS — Z23 Encounter for immunization: Secondary | ICD-10-CM

## 2020-10-23 DIAGNOSIS — R7989 Other specified abnormal findings of blood chemistry: Secondary | ICD-10-CM

## 2020-10-23 DIAGNOSIS — R739 Hyperglycemia, unspecified: Secondary | ICD-10-CM | POA: Diagnosis not present

## 2020-10-23 DIAGNOSIS — E782 Mixed hyperlipidemia: Secondary | ICD-10-CM | POA: Diagnosis not present

## 2020-10-23 DIAGNOSIS — I10 Essential (primary) hypertension: Secondary | ICD-10-CM

## 2020-10-23 DIAGNOSIS — J309 Allergic rhinitis, unspecified: Secondary | ICD-10-CM | POA: Diagnosis not present

## 2020-10-23 LAB — COMPREHENSIVE METABOLIC PANEL
ALT: 21 U/L (ref 0–53)
AST: 23 U/L (ref 0–37)
Albumin: 4.5 g/dL (ref 3.5–5.2)
Alkaline Phosphatase: 51 U/L (ref 39–117)
BUN: 11 mg/dL (ref 6–23)
CO2: 30 mEq/L (ref 19–32)
Calcium: 9.5 mg/dL (ref 8.4–10.5)
Chloride: 98 mEq/L (ref 96–112)
Creatinine, Ser: 0.95 mg/dL (ref 0.40–1.50)
GFR: 82.23 mL/min (ref >60.00)
Glucose, Bld: 125 mg/dL — ABNORMAL HIGH (ref 70–99)
Potassium: 4.6 mEq/L (ref 3.5–5.1)
Sodium: 135 mEq/L (ref 135–145)
Total Bilirubin: 0.7 mg/dL (ref 0.2–1.2)
Total Protein: 7 g/dL (ref 6.0–8.3)

## 2020-10-23 LAB — LIPID PANEL
Cholesterol: 166 mg/dL (ref 0–200)
HDL: 63.7 mg/dL (ref >39.00)
LDL Cholesterol: 71 mg/dL (ref 0–99)
NonHDL: 102.59
Total CHOL/HDL Ratio: 3
Triglycerides: 158 mg/dL — ABNORMAL HIGH (ref 0.0–149.0)
VLDL: 31.6 mg/dL (ref 0.0–40.0)

## 2020-10-23 LAB — TSH: TSH: 1.35 u[IU]/mL (ref 0.35–4.50)

## 2020-10-23 LAB — HEMOGLOBIN A1C: Hgb A1c MFr Bld: 6 % (ref 4.6–6.5)

## 2020-10-23 LAB — CBC
HCT: 45.2 % (ref 39.0–52.0)
Hemoglobin: 15.5 g/dL (ref 13.0–17.0)
MCHC: 34.2 g/dL (ref 30.0–36.0)
MCV: 97.7 fl (ref 78.0–100.0)
Platelets: 230 10*3/uL (ref 150.0–400.0)
RBC: 4.63 Mil/uL (ref 4.22–5.81)
RDW: 12.5 % (ref 11.5–15.5)
WBC: 8 10*3/uL (ref 4.0–10.5)

## 2020-10-23 LAB — VITAMIN D 25 HYDROXY (VIT D DEFICIENCY, FRACTURES): VITD: 56.92 ng/mL (ref 30.00–100.00)

## 2020-10-23 MED ORDER — FEXOFENADINE HCL 180 MG PO TABS: 180.0000 mg | ORAL_TABLET | Freq: Every day | ORAL | Status: AC

## 2020-10-23 MED ORDER — FLUTICASONE PROPIONATE 50 MCG/ACT NA SUSP: 2.0000 | Freq: Every day | NASAL | 2 refills | Status: DC | PRN

## 2020-10-23 MED ORDER — SILDENAFIL CITRATE 20 MG PO TABS: 20.0000 mg | ORAL_TABLET | Freq: Every day | ORAL | 1 refills | Status: DC | PRN

## 2020-10-23 MED FILL — MODERNA COVID-19 VACCINE 10: 100 | 1 days supply | Qty: 0 | Fill #0

## 2020-10-23 NOTE — Patient Instructions (Addendum)
MInd Diet for weight loss  Covid at [place] boosters Hypertension, Adult High blood pressure (hypertension) is when the force of blood pumping through the arteries is too strong. The arteries are the blood vessels that carry blood from the heart throughout the body. Hypertension forces the heart to work harder to pump blood and may cause arteries to become narrow or stiff. Untreated or uncontrolled hypertension can cause a heart attack, heart failure, a stroke, kidney disease, and other problems. A blood pressure reading consists of a higher number over a lower number. Ideally, your blood pressure should be below 120/80. The first ("top") number is called the systolic pressure. It is a measure of the pressure in your arteries as your heart beats. The second ("bottom") number is called the diastolic pressure. It is a measure of the pressure in your arteries as the heart relaxes. What are the causes? The exact cause of this condition is not known. There are some conditions that result in or are related to high blood pressure. What increases the risk? Some risk factors for high blood pressure are under your control. The following factors may make you more likely to develop this condition:  Smoking.  Having type 2 diabetes mellitus, high cholesterol, or both.  Not getting enough exercise or physical activity.  Being overweight.  Having too much fat, sugar, calories, or salt (sodium) in your diet.  Drinking too much alcohol. Some risk factors for high blood pressure may be difficult or impossible to change. Some of these factors include:  Having chronic kidney disease.  Having a family history of high blood pressure.  Age. Risk increases with age.  Race. You may be at higher risk if you are African American.  Gender. Men are at higher risk than women before age 33. After age 62, women are at higher risk than men.  Having obstructive sleep apnea.  Stress. What are the  signs or symptoms? High blood pressure may not cause symptoms. Very high blood pressure (hypertensive crisis) may cause:  Headache.  Anxiety.  Shortness of breath.  Nosebleed.  Nausea and vomiting.  Vision changes.  Severe chest pain.  Seizures. How is this diagnosed? This condition is diagnosed by measuring your blood pressure while you are seated, with your arm resting on a flat surface, your legs uncrossed, and your feet flat on the floor. The cuff of the blood pressure monitor will be placed directly against the skin of your upper arm at the level of your heart. It should be measured at least twice using the same arm. Certain conditions can cause a difference in blood pressure between your right and left arms. Certain factors can cause blood pressure readings to be lower or higher than normal for a short period of time:  When your blood pressure is higher when you are in a health care provider's office than when you are at home, this is called white coat hypertension. Most people with this condition do not need medicines.  When your blood pressure is higher at home than when you are in a health care provider's office, this is called masked hypertension. Most people with this condition may need medicines to control blood pressure. If you have a high blood pressure reading during one visit or you have normal blood pressure with other risk factors, you may be asked to:  Return on a different day to have your blood pressure checked again.  Monitor your blood pressure at home for 1 week or longer.  If you are diagnosed with hypertension, you may have other blood or imaging tests to help your health care provider understand your overall risk for other conditions. How is this treated? This condition is treated by making healthy lifestyle changes, such as eating healthy foods, exercising more, and reducing your alcohol intake. Your health care provider may prescribe medicine if lifestyle  changes are not enough to get your blood pressure under control, and if:  Your systolic blood pressure is above 130.  Your diastolic blood pressure is above 80. Your personal target blood pressure may vary depending on your medical conditions, your age, and other factors. Follow these instructions at home: Eating and drinking  Eat a diet that is high in fiber and potassium, and low in sodium, added sugar, and fat. An example eating plan is called the DASH (Dietary Approaches to Stop Hypertension) diet. To eat this way: ? Eat plenty of fresh fruits and vegetables. Try to fill one half of your plate at each meal with fruits and vegetables. ? Eat whole grains, such as whole-wheat pasta, brown rice, or whole-grain bread. Fill about one fourth of your plate with whole grains. ? Eat or drink low-fat dairy products, such as skim milk or low-fat yogurt. ? Avoid fatty cuts of meat, processed or cured meats, and poultry with skin. Fill about one fourth of your plate with lean proteins, such as fish, chicken without skin, beans, eggs, or tofu. ? Avoid pre-made and processed foods. These tend to be higher in sodium, added sugar, and fat.  Reduce your daily sodium intake. Most people with hypertension should eat less than 1,500 mg of sodium a day.  Do not drink alcohol if: ? Your health care provider tells you not to drink. ? You are pregnant, may be pregnant, or are planning to become pregnant.  If you drink alcohol: ? Limit how much you use to:  0-1 drink a day for women.  0-2 drinks a day for men. ? Be aware of how much alcohol is in your drink. In the [place] one drink equals one 12 oz bottle of beer (355 mL), one 5 oz glass of wine (148 mL), or one 1 oz glass of hard liquor (44 mL).   Lifestyle  Work with your health care provider to maintain a healthy body weight or to lose weight. Ask what an ideal weight is for you.  Get at least 30 minutes of exercise most days of the week. Activities may  include walking, swimming, or biking.  Include exercise to strengthen your muscles (resistance exercise), such as Pilates or lifting weights, as part of your weekly exercise routine. Try to do these types of exercises for 30 minutes at least 3 days a week.  Do not use any products that contain nicotine or tobacco, such as cigarettes, e-cigarettes, and chewing tobacco. If you need help quitting, ask your health care provider.  Monitor your blood pressure at home as told by your health care provider.  Keep all follow-up visits as told by your health care provider. This is important.   Medicines  Take over-the-counter and prescription medicines only as told by your health care provider. Follow directions carefully. Blood pressure medicines must be taken as prescribed.  Do not skip doses of blood pressure medicine. Doing this puts you at risk for problems and can make the medicine less effective.  Ask your health care provider about side effects or reactions to medicines that you should watch for. Contact a health care  provider if you:  Think you are having a reaction to a medicine you are taking.  Have headaches that keep coming back (recurring).  Feel dizzy.  Have swelling in your ankles.  Have trouble with your vision. Get help right away if you:  Develop a severe headache or confusion.  Have unusual weakness or numbness.  Feel faint.  Have severe pain in your chest or abdomen.  Vomit repeatedly.  Have trouble breathing. Summary  Hypertension is when the force of blood pumping through your arteries is too strong. If this condition is not controlled, it may put you at risk for serious complications.  Your personal target blood pressure may vary depending on your medical conditions, your age, and other factors. For most people, a normal blood pressure is less than 120/80.  Hypertension is treated with lifestyle changes, medicines, or a combination of both. Lifestyle changes  include losing weight, eating a healthy, low-sodium diet, exercising more, and limiting alcohol. This information is not intended to replace advice given to you by your health care provider. Make sure you discuss any questions you have with your health care provider. Document Revised: 06/09/2018 Document Reviewed: 06/09/2018 Elsevier Patient Education  2021 [employer].

## 2020-10-23 NOTE — Progress Notes (Signed)
   Covid-[facility] Vaccination [facility]  Name:  William Phillips    MRN: [id number] DOB: [birth year]  10/23/2020  [name] was observed post Covid-19 immunization for 15 minutes without incident. He was provided with Vaccine Information Sheet and instruction to access the V-Safe system.   [name] was instructed to call 911 with any severe reactions post vaccine: Marland Kitchen Difficulty breathing  . Swelling of face and throat  . A fast heartbeat  . A bad rash all over body  . Dizziness and weakness   Immunizations Administered    Name Date Dose VIS Date Route   Moderna Covid-19 Booster Vaccine 10/23/2020 11:04 AM 0.25 mL 08/01/2020 Intramuscular   Manufacturer: Levan Hurst   Lot: [id number]   [place]: 76808-811-03

## 2020-10-24 NOTE — Assessment & Plan Note (Signed)
Given refill on Sildenafil ?

## 2020-10-24 NOTE — Assessment & Plan Note (Signed)
hgba1c acceptable, minimize simple carbs. Increase exercise as tolerated.  

## 2020-10-24 NOTE — Progress Notes (Signed)
Subjective:    Patient ID: William Phillips, male    DOB: 1951-06-30, 70 y.o.   MRN: [id number]  Chief Complaint  Patient presents with  . Follow-up    HPI Patient is in today for follow up on chronic medical concerns. No recent febrile illness or hospitalizations. Denies CP/palp/SOB/HA/congestion/fevers/GI or GU c/o. Taking meds as prescribed. He needs a refill on the Sildenafil which is helful. He has trouble with allergies and congestion at times but Allegra and Flonase are helpful.  Past Medical History:  Diagnosis Date  . Allergic rhinitis   . Ankle fracture 1998  . Atrial flutter ([place])    a. s/p CTI ablation by Dr Rayann Heman 11/2014  . Back pain 08/05/2015  . BPH (benign prostatic hyperplasia) 08/26/2015  . Ganglion cyst of dorsum of right wrist 04/08/2017  . GERD (gastroesophageal reflux disease)   . Hammer toe of right foot 05/06/2013  . History of carpal tunnel syndrome   . Hyperlipidemia   . Hypertension   . Insect bite 04/23/2015  . OA (osteoarthritis) 02/06/2013   knees  . Pedal edema 04/08/2017  . Preventative health care 04/27/2011   Had normal colonoscopy in 2011     Past Surgical History:  Procedure Laterality Date  . ABLATION  11-24-14   CTI ablation by Dr Rayann Heman  . ANKLE SURGERY    . ATRIAL FLUTTER ABLATION N/A 11/24/2014   Procedure: ATRIAL FLUTTER ABLATION;  Surgeon: Thompson Grayer, MD;  Location: [facility] CATH LAB;  Service: Cardiovascular;  Laterality: N/A;  . Carpel tunnel    . COLONOSCOPY  08/04/2002   Normal Exam- Dr Henrene Pastor  . INGUINAL HERNIA REPAIR  2008  . TONSILLECTOMY      Family History  Problem Relation Age of Onset  . Coronary artery disease Father        aortic valve disease  . Heart attack Father 55  . COPD Father   . Hypertension Father   . Hyperlipidemia Father   . Dementia Father   . Diabetes type II Maternal Grandfather   . Diabetes Maternal Grandfather   . Arthritis Mother   . Stroke Mother   . Diabetes Sister   . Hyperlipidemia Other    . Hypertension Other     Social History   Socioeconomic History  . Marital status: Married    Spouse name: Not on file  . Number of children: 1  . Years of education: Not on file  . Highest education level: Not on file  Occupational History    Comment: Sales  Tobacco Use  . Smoking status: Former Research scientist (life sciences)  . Smokeless tobacco: Never Used  . Tobacco comment: 5-10 pack year history  Vaping Use  . Vaping Use: Never used  Substance and Sexual Activity  . Alcohol use: Yes    Comment: 2-3 glasses of wine per day  . Drug use: No  . Sexual activity: Yes  Other Topics Concern  . 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 Difficulty of Paying Living Expenses: Not hard at all  Food Insecurity: Not on file  Transportation Needs: No Transportation Needs  . Lack of Transportation (Medical):  No  . Lack of Transportation (Non-Medical): No  Physical Activity: Not on file  Stress: Not on file  Social Connections: Not on file  Intimate Partner Violence: Not on file    Outpatient Medications Prior to Visit  Medication Sig Dispense Refill  . amLODipine (NORVASC) 10 MG tablet TAKE ONE-HALF TABLET BY  MOUTH TWICE DAILY 90 tablet 3  . aspirin 81 MG tablet Take 81 mg by mouth daily.    . metoprolol succinate (TOPROL-XL) 100 MG 24 hr tablet TAKE 1 TABLET BY MOUTH  DAILY WITH OR IMMEDIATELY  FOLLOWING A MEAL 90 tablet 3  . Multiple Vitamin (MULTIVITAMIN) tablet Take 1 tablet by mouth daily.    Marland Kitchen omeprazole (PRILOSEC) 20 MG capsule TAKE 1 CAPSULE BY MOUTH  DAILY 90 capsule 3  . quinapril-hydrochlorothiazide (ACCURETIC) 20-12.5 MG tablet TAKE 1 TABLET BY MOUTH  DAILY 90 tablet 1  . simvastatin (ZOCOR) 20 MG  tablet TAKE 1 TABLET BY MOUTH AT  BEDTIME 90 tablet 3  . tamsulosin (FLOMAX) 0.4 MG CAPS capsule TAKE 1 CAPSULE BY MOUTH  DAILY 90 capsule 3  . sildenafil (REVATIO) 20 MG tablet Take 1-4 tablets (20-80 mg total) by mouth daily as needed. 90 tablet 1   No facility-administered medications prior to visit.    Allergies  Allergen Reactions  . Penicillins     REACTION: swelling , irriation    Review of Systems  Constitutional: Negative for fever and malaise/fatigue.  HENT: Positive for congestion.   Eyes: Negative for blurred vision.  Respiratory: Negative for shortness of breath.   Cardiovascular: Negative for chest pain, palpitations and leg swelling.  Gastrointestinal: Negative for abdominal pain, blood in stool and nausea.  Genitourinary: Negative for dysuria and frequency.  Musculoskeletal: Negative for falls.  Skin: Negative for rash.  Neurological: Negative for dizziness, loss of consciousness and headaches.  Endo/Heme/Allergies: Negative for environmental allergies.  Psychiatric/Behavioral: Negative for depression. The patient is not nervous/anxious.        Objective:    Physical Exam Vitals and nursing note reviewed.  Constitutional:      General: He is not in acute distress.    Appearance: He is well-developed and well-nourished.  HENT:     Head: Normocephalic and atraumatic.     Nose: Nose normal.  Eyes:     General:        Right eye: No discharge.        Left eye: No discharge.  Cardiovascular:     Rate and Rhythm: Normal rate and regular rhythm.     Heart sounds: No murmur heard.   Pulmonary:     Effort: Pulmonary effort is normal.     Breath sounds: Normal breath sounds.  Abdominal:     General: Bowel sounds are normal.     Palpations: Abdomen is soft.     Tenderness: There is no abdominal tenderness.  Musculoskeletal:        General: No edema.     Cervical back: Normal range of motion and neck supple.  Skin:    General: Skin is warm and dry.   Neurological:     Mental Status: He is alert and oriented to person, place, and time.  Psychiatric:        Mood and Affect: Mood and affect normal.     There were no vitals taken for this visit. Wt Readings from Last 3 Encounters:  02/16/20 199 lb 6.4 oz (90.4 kg)  12/05/19 199 lb 2 oz (90.3 kg)  06/02/19  195 lb 3.2 oz (88.5 kg)    Diabetic Foot Exam - Simple   No data filed    Lab Results  Component Value Date   WBC 8.0 10/23/2020   HGB 15.5 10/23/2020   HCT 45.2 10/23/2020   PLT 230.0 10/23/2020   GLUCOSE 125 (H) 10/23/2020   CHOL 166 10/23/2020   TRIG 158.0 (H) 10/23/2020   HDL 63.70 10/23/2020   LDLCALC 71 10/23/2020   ALT 21 10/23/2020   AST 23 10/23/2020   NA 135 10/23/2020   K 4.6 10/23/2020   CL 98 10/23/2020   CREATININE 0.95 10/23/2020   BUN 11 10/23/2020   CO2 30 10/23/2020   TSH 1.35 10/23/2020   PSA 2.33 02/16/2020   HGBA1C 6.0 10/23/2020   MICROALBUR 0.7 10/19/2015    Lab Results  Component Value Date   TSH 1.35 10/23/2020   Lab Results  Component Value Date   WBC 8.0 10/23/2020   HGB 15.5 10/23/2020   HCT 45.2 10/23/2020   MCV 97.7 10/23/2020   PLT 230.0 10/23/2020   Lab Results  Component Value Date   NA 135 10/23/2020   K 4.6 10/23/2020   CO2 30 10/23/2020   GLUCOSE 125 (H) 10/23/2020   BUN 11 10/23/2020   CREATININE 0.95 10/23/2020   BILITOT 0.7 10/23/2020   ALKPHOS 51 10/23/2020   AST 23 10/23/2020   ALT 21 10/23/2020   PROT 7.0 10/23/2020   ALBUMIN 4.5 10/23/2020   CALCIUM 9.5 10/23/2020   ANIONGAP 15 09/25/2014   GFR 82.23 10/23/2020   Lab Results  Component Value Date   CHOL 166 10/23/2020   Lab Results  Component Value Date   HDL 63.70 10/23/2020   Lab Results  Component Value Date   LDLCALC 71 10/23/2020   Lab Results  Component Value Date   TRIG 158.0 (H) 10/23/2020   Lab Results  Component Value Date   CHOLHDL 3 10/23/2020   Lab Results  Component Value Date   HGBA1C 6.0 10/23/2020        Assessment & Plan:   Problem List Items Addressed This Visit    Hyperlipidemia, mixed - Primary   Relevant Medications   sildenafil (REVATIO) 20 MG tablet   Other Relevant Orders   Lipid panel (Completed)   ERECTILE DYSFUNCTION    Given refill on Sildenafil      Essential hypertension    Well controlled, no changes to meds. Encouraged heart healthy diet such as the DASH diet and exercise as tolerated.       Relevant Medications   sildenafil (REVATIO) 20 MG tablet   Other Relevant Orders   CBC (Completed)   Comprehensive metabolic panel (Completed)   Allergic rhinitis    Uses Allegra and Flonase prn with good results      Hyperglycemia    hgba1c acceptable, minimize simple carbs. Increase exercise as tolerated.       Relevant Orders   Hemoglobin A1c (Completed)   OA (osteoarthritis)   Relevant Orders   VITAMIN D 25 Hydroxy (Vit-D Deficiency, Fractures) (Completed)   Abnormal TSH    Check labs      Relevant Orders   TSH (Completed)      I am having William Ada "Ken" start on fluticasone and fexofenadine. I am also having him maintain his multivitamin, aspirin, amLODipine, quinapril-hydrochlorothiazide, metoprolol succinate, tamsulosin, omeprazole, simvastatin, and sildenafil.  Meds ordered this encounter  Medications  . sildenafil (REVATIO) 20 MG tablet    Sig: Take 1-4  tablets (20-80 mg total) by mouth daily as needed.    Dispense:  90 tablet    Refill:  1  . fluticasone (FLONASE) 50 MCG/ACT nasal spray    Sig: Place 2 sprays into both nostrils daily as needed for allergies or rhinitis.    Refill:  2  . fexofenadine (ALLEGRA) 180 MG tablet    Sig: Take 1 tablet (180 mg total) by mouth daily.     Penni Homans, MD

## 2020-10-24 NOTE — Assessment & Plan Note (Signed)
Uses Allegra and Flonase prn with good results

## 2020-10-24 NOTE — Assessment & Plan Note (Signed)
Well controlled, no changes to meds. Encouraged heart healthy diet such as the DASH diet and exercise as tolerated.  °

## 2020-10-24 NOTE — Assessment & Plan Note (Signed)
Check labs 

## 2020-11-11 ENCOUNTER — Other Ambulatory Visit: Payer: Self-pay | Admitting: Family Medicine

## 2020-11-21 ENCOUNTER — Encounter: Payer: Self-pay | Admitting: Internal Medicine

## 2021-01-25 ENCOUNTER — Other Ambulatory Visit: Payer: Self-pay | Admitting: Family Medicine

## 2021-02-06 DIAGNOSIS — Z85828 Personal history of other malignant neoplasm of skin: Secondary | ICD-10-CM | POA: Diagnosis not present

## 2021-02-06 DIAGNOSIS — L218 Other seborrheic dermatitis: Secondary | ICD-10-CM | POA: Diagnosis not present

## 2021-02-06 DIAGNOSIS — D225 Melanocytic nevi of trunk: Secondary | ICD-10-CM | POA: Diagnosis not present

## 2021-04-02 ENCOUNTER — Ambulatory Visit (INDEPENDENT_AMBULATORY_CARE_PROVIDER_SITE_OTHER): Payer: Medicare Other | Admitting: Family Medicine

## 2021-04-02 ENCOUNTER — Other Ambulatory Visit: Payer: Self-pay | Admitting: Family Medicine

## 2021-04-02 ENCOUNTER — Encounter: Payer: Self-pay | Admitting: Family Medicine

## 2021-04-02 ENCOUNTER — Other Ambulatory Visit: Payer: Self-pay

## 2021-04-02 VITALS — BP 100/80 | HR 138 | Temp 98.2°F | Resp 18 | Ht 70.5 in | Wt 205.0 lb

## 2021-04-02 DIAGNOSIS — R009 Unspecified abnormalities of heart beat: Secondary | ICD-10-CM

## 2021-04-02 LAB — CBC WITH DIFFERENTIAL/PLATELET
Basophils Absolute: 0.1 10*3/uL (ref 0.0–0.1)
Basophils Relative: 0.6 % (ref 0.0–3.0)
Eosinophils Absolute: 0.3 10*3/uL (ref 0.0–0.7)
Eosinophils Relative: 3.3 % (ref 0.0–5.0)
HCT: 48.1 % (ref 39.0–52.0)
Hemoglobin: 16.1 g/dL (ref 13.0–17.0)
Lymphocytes Relative: 22.1 % (ref 12.0–46.0)
Lymphs Abs: 2.2 10*3/uL (ref 0.7–4.0)
MCHC: 33.6 g/dL (ref 30.0–36.0)
MCV: 97.9 fl (ref 78.0–100.0)
Monocytes Absolute: 0.9 10*3/uL (ref 0.1–1.0)
Monocytes Relative: 8.9 % (ref 3.0–12.0)
Neutro Abs: 6.6 10*3/uL (ref 1.4–7.7)
Neutrophils Relative %: 65.1 % (ref 43.0–77.0)
Platelets: 261 10*3/uL (ref 150.0–400.0)
RBC: 4.91 Mil/uL (ref 4.22–5.81)
RDW: 12.5 % (ref 11.5–15.5)
WBC: 10.2 10*3/uL (ref 4.0–10.5)

## 2021-04-02 LAB — LIPID PANEL
Cholesterol: 184 mg/dL (ref 0–200)
HDL: 68.4 mg/dL (ref >39.00)
LDL Cholesterol: 78 mg/dL (ref 0–99)
NonHDL: 115.93
Total CHOL/HDL Ratio: 3
Triglycerides: 189 mg/dL — ABNORMAL HIGH (ref 0.0–149.0)
VLDL: 37.8 mg/dL (ref 0.0–40.0)

## 2021-04-02 LAB — TSH: TSH: 1.27 u[IU]/mL (ref 0.35–4.50)

## 2021-04-02 LAB — D-DIMER, QUANTITATIVE: D-Dimer, Quant: 0.46 mcg/mL FEU (ref <0.50)

## 2021-04-02 NOTE — Patient Instructions (Signed)
Sinus Tachycardia  Sinus tachycardia is a kind of fast heartbeat. In sinus tachycardia, the heart beats more than 100 times a minute. Sinus tachycardia starts in a part of the heart called the sinus node. Sinus tachycardia may be harmless, or it may be asign of a serious condition. What are the causes? This condition may be caused by: Exercise or exertion. A fever. Pain. Loss of body fluids (dehydration). Severe bleeding (hemorrhage). Anxiety and stress. Certain substances, including: Alcohol. Caffeine. Tobacco and nicotine products. Cold medicines. Illegal drugs. Medical conditions including: Heart disease. An infection. An overactive thyroid (hyperthyroidism). A lack of red blood cells (anemia). What are the signs or symptoms? Symptoms of this condition include: A feeling that the heart is beating quickly (palpitations). Suddenly noticing your heartbeat (cardiac awareness). Dizziness. Tiredness (fatigue). Shortness of breath. Chest pain. Nausea. Fainting. How is this diagnosed? This condition is diagnosed with: A physical exam. Other tests, such as: Blood tests. An electrocardiogram (ECG). This test measures the electrical activity of the heart. Ambulatory cardiac monitor. This records your heartbeats for 24 hours or more. You may be referred to a heart specialist (cardiologist). How is this treated? Treatment for this condition depends on the cause or the underlying condition. Treatment may involve: Treating the underlying condition. Taking new medicines or changing your current medicines as told by your health care provider. Making changes to your diet or lifestyle. Follow these instructions at home: Lifestyle  Do not use any products that contain nicotine or tobacco, such as cigarettes and e-cigarettes. If you need help quitting, ask your health care provider. Do not use illegal drugs, such as cocaine. Learn relaxation methods to help you when you get stressed  or anxious. These include deep breathing. Avoid caffeine or other stimulants.  Alcohol use  Do not drink alcohol if: Your health care provider tells you not to drink. You are pregnant, may be pregnant, or are planning to become pregnant. If you drink alcohol, limit how much you have: 0-1 drink a day for women. 0-2 drinks a day for men. Be aware of how much alcohol is in your drink. In the [place] one drink equals one typical bottle of beer (12 oz), one-half glass of wine (5 oz), or one shot of hard liquor (1 oz).  General instructions Drink enough fluids to keep your urine pale yellow. Take over-the-counter and prescription medicines only as told by your health care provider. Keep all follow-up visits as told by your health care provider. This is important. Contact a health care provider if you have: A fever. Vomiting or diarrhea that does not go away. Get help right away if you: Have pain in your chest, upper arms, jaw, or neck. Become weak or dizzy. Feel faint. Have palpitations that do not go away. Summary In sinus tachycardia, the heart beats more than 100 times a minute. Sinus tachycardia may be harmless, or it may be a sign of a serious condition. Treatment for this condition depends on the cause or the underlying condition. Get help right away if you have pain in your chest, upper arms, jaw, or neck. This information is not intended to replace advice given to you by your health care provider. Make sure you discuss any questions you have with your healthcare provider. Document Revised: 11/18/2017 Document Reviewed: 11/18/2017 Elsevier Patient Education  [place].

## 2021-04-02 NOTE — Progress Notes (Addendum)
Subjective:   By signing my name below, I, Shehryar Baig, attest that this documentation has been prepared under the direction and in the presence of Dr. Roma Schanz, DO. 04/02/2021    Patient ID: William Phillips, male    DOB: 30-May-1951, 70 y.o.   MRN: [id number]  Chief Complaint  Patient presents with   heart rate    Pt states heart rate has been going up and down over the last several days. Pt reports no SOB, chest pain or dizziness.     HPI Patient is in today for a office visit. He complains of having erratic pulse and fatigue since Saturday. He is not taking any OTC medication that can increase his heart rate. His heart rate is elevated during this visit. He reports having Covid-19 2 weeks ago but has since recovered. He denies having any SOB and pain in his legs at this time. He continues taking 10 mg amlodipine and 100 mg metoprolol succinate daily PO to manage his blood pressure. He continues taking 20 mg simvastatin daily PO to manage his cholesterol levels. He stopped seeing his cardiologist. During his last visit with his cardiologist he found A-fib but it was not bothering him. He last saw his PCP, Dr. Willette [place], on January but could not remember his pulse reading during that time.   He has no symptoms today of the fast heart rate.   He states he feels fine-- he is only here because his wife was concerned.    Pulse Readings from Last 3 Encounters:  04/02/21 (!) 138  02/16/20 68  12/05/19 65     Past Medical History:  Diagnosis Date   Allergic rhinitis    Ankle fracture 1998   Atrial flutter ([place])    a. s/p CTI ablation by Dr Rayann Heman 11/2014   Back pain 08/05/2015   BPH (benign prostatic hyperplasia) 08/26/2015   Ganglion cyst of dorsum of right wrist 04/08/2017   GERD (gastroesophageal reflux disease)    Hammer toe of right foot 05/06/2013   History of carpal tunnel syndrome    Hyperlipidemia    Hypertension    Insect bite 04/23/2015   OA (osteoarthritis)  02/06/2013   knees   Pedal edema 04/08/2017   Preventative health care 04/27/2011   Had normal colonoscopy in 2011     Past Surgical History:  Procedure Laterality Date   ABLATION  11-24-14   CTI ablation by Dr Rayann Heman   ANKLE SURGERY     ATRIAL FLUTTER ABLATION N/A 11/24/2014   Procedure: ATRIAL FLUTTER ABLATION;  Surgeon: Thompson Grayer, MD;  [facility] C[facility] LAB;  Service: Cardiovascular;  Laterality: N/A;   Carpel tunnel     COLONOSCOPY  08/04/2002   Normal Exam- Dr Fransico Meadow HERNIA REPAIR  2008   TONSILLECTOMY      Family History  Problem Relation Age of Onset   Coronary artery disease Father        aortic valve disease   Heart attack Father 33   COPD Father    Hypertension Father    Hyperlipidemia Father    Dementia Father    Diabetes type II Maternal Grandfather    Diabetes Maternal Grandfather    Arthritis Mother    Stroke Mother    Diabetes Sister    Hyperlipidemia Other    Hypertension Other     Social History   Socioeconomic History   Marital status: Married    Spouse name: Not on file  Number of children: 1   Years of education: Not on file   Highest education level: Not on file  Occupational History    Comment: Sales  Tobacco Use   Smoking status: Former    Pack years: 0.00   Smokeless tobacco: Never   Tobacco comments:    5-10 pack year history  Vaping Use   Vaping Use: Never used  Substance and Sexual Activity   Alcohol use: Yes    Comment: 2-3 glasses of wine per day   Drug use: No   Sexual activity: Yes  Other Topics Concern   Not on file  Social History Narrative   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, seed, erosion control)   Married- 40 years   21 daughter -Paramedic in   ([place]   Previous smoker - 5-10 pack yrs     Alcohol use-yes (2-3 glasses of wine)     Social Determinants of Health   Financial Resource Strain: Not on file  Food Insecurity: Not on file  Transportation Needs: Not on file  Physical  Activity: Not on file  Stress: Not on file  Social Connections: Not on file  Intimate Partner Violence: Not on file    Outpatient Medications Prior to Visit  Medication Sig Dispense Refill   amLODipine (NORVASC) 10 MG tablet TAKE ONE-HALF TABLET BY  MOUTH TWICE DAILY 90 tablet 3   aspirin 81 MG tablet Take 81 mg by mouth daily.     fexofenadine (ALLEGRA) 180 MG tablet Take 1 tablet (180 mg total) by mouth daily.     fluticasone (FLONASE) 50 MCG/ACT nasal spray Place 2 sprays into both nostrils daily as needed for allergies or rhinitis.  2   metoprolol succinate (TOPROL-XL) 100 MG 24 hr tablet TAKE 1 TABLET BY MOUTH  DAILY WITH OR IMMEDIATELY  FOLLOWING A MEAL 90 tablet 3   Multiple Vitamin (MULTIVITAMIN) tablet Take 1 tablet by mouth daily.     omeprazole (PRILOSEC) 20 MG capsule TAKE 1 CAPSULE BY MOUTH  DAILY 90 capsule 3   quinapril-hydrochlorothiazide (ACCURETIC) 20-12.5 MG tablet TAKE 1 TABLET BY MOUTH  DAILY 90 tablet 1   sildenafil (REVATIO) 20 MG tablet Take 1-4 tablets (20-80 mg total) by mouth daily as needed. 90 tablet 1   simvastatin (ZOCOR) 20 MG tablet TAKE 1 TABLET BY MOUTH AT  BEDTIME 90 tablet 3   tamsulosin (FLOMAX) 0.4 MG CAPS capsule TAKE 1 CAPSULE BY MOUTH  DAILY 90 capsule 3   COVID-19 mRNA vaccine, Moderna, 100 MCG/0.5ML injection INJECT AS DIRECTED (Patient not taking: Reported on 04/02/2021) .25 mL 0   No facility-administered medications prior to visit.    Allergies  Allergen Reactions   Penicillins     REACTION: swelling , irriation    Review of Systems  Constitutional:  Negative for fever and malaise/fatigue.  HENT:  Negative for congestion.   Eyes:  Negative for blurred vision.  Respiratory:  Negative for cough, sputum production and shortness of breath.   Cardiovascular:  Negative for chest pain, palpitations and leg swelling.       (+)Tachycardia  Gastrointestinal:  Negative for abdominal pain, blood in stool and nausea.  Genitourinary:  Negative for  dysuria and frequency.  Musculoskeletal:  Negative for falls.       (-)Leg pain  Skin:  Negative for rash.  Neurological:  Negative for dizziness, loss of consciousness and headaches.  Endo/Heme/Allergies:  Negative for environmental allergies.  Psychiatric/Behavioral:  Negative for depression. The patient is not  nervous/anxious.       Objective:    Physical Exam Vitals and nursing note reviewed.  Constitutional:      General: He is not in acute distress.    Appearance: Normal appearance. He is not ill-appearing.  HENT:     Head: Normocephalic and atraumatic.     Right Ear: External ear normal.     Left Ear: External ear normal.  Eyes:     Extraocular Movements: Extraocular movements intact.     Pupils: Pupils are equal, round, and reactive to light.  Cardiovascular:     Rate and Rhythm: Regular rhythm. Tachycardia present.     Pulses: Normal pulses.     Heart sounds: Normal heart sounds. No murmur heard.   No gallop.  Pulmonary:     Effort: Pulmonary effort is normal. No respiratory distress.     Breath sounds: Normal breath sounds. No wheezing, rhonchi or rales.  Skin:    General: Skin is warm and dry.  Neurological:     Mental Status: He is alert and oriented to person, place, and time.  Psychiatric:        Behavior: Behavior normal.    BP 100/80 (BP Location: Left Arm, Patient Position: Sitting, Cuff Size: Normal)   Pulse (!) 138   Temp 98.2 F (36.8 C) (Oral)   Resp 18   Ht 5' 10.5" (1.791 m)   Wt 205 lb (93 kg)   SpO2 98%   BMI 29.00 kg/m  Wt Readings from Last 3 Encounters:  04/02/21 205 lb (93 kg)  02/16/20 199 lb 6.4 oz (90.4 kg)  12/05/19 199 lb 2 oz (90.3 kg)    Diabetic Foot Exam - Simple   No data filed    Lab Results  Component Value Date   WBC 10.2 04/02/2021   HGB 16.1 04/02/2021   HCT 48.1 04/02/2021   PLT 261.0 04/02/2021   GLUCOSE 125 (H) 10/23/2020   CHOL 184 04/02/2021   TRIG 189.0 (H) 04/02/2021   HDL 68.40 04/02/2021    LDLCALC 78 04/02/2021   ALT 21 10/23/2020   AST 23 10/23/2020   NA 135 10/23/2020   K 4.6 10/23/2020   CL 98 10/23/2020   CREATININE 0.95 10/23/2020   BUN 11 10/23/2020   CO2 30 10/23/2020   TSH 1.27 04/02/2021   PSA 2.33 02/16/2020   HGBA1C 6.0 10/23/2020   MICROALBUR 0.7 10/19/2015    Lab Results  Component Value Date   TSH 1.27 04/02/2021   Lab Results  Component Value Date   WBC 10.2 04/02/2021   HGB 16.1 04/02/2021   HCT 48.1 04/02/2021   MCV 97.9 04/02/2021   PLT 261.0 04/02/2021   Lab Results  Component Value Date   NA 135 10/23/2020   K 4.6 10/23/2020   CO2 30 10/23/2020   GLUCOSE 125 (H) 10/23/2020   BUN 11 10/23/2020   CREATININE 0.95 10/23/2020   BILITOT 0.7 10/23/2020   ALKPHOS 51 10/23/2020   AST 23 10/23/2020   ALT 21 10/23/2020   PROT 7.0 10/23/2020   ALBUMIN 4.5 10/23/2020   CALCIUM 9.5 10/23/2020   ANIONGAP 15 09/25/2014   GFR 82.23 10/23/2020   Lab Results  Component Value Date   CHOL 184 04/02/2021   Lab Results  Component Value Date   HDL 68.40 04/02/2021   Lab Results  Component Value Date   LDLCALC 78 04/02/2021   Lab Results  Component Value Date   TRIG 189.0 (H) 04/02/2021   Lab  Results  Component Value Date   CHOLHDL 3 04/02/2021   Lab Results  Component Value Date   HGBA1C 6.0 10/23/2020   Ekg-- sinus tach 139--- previous ekg sinus brady 10/2017     Assessment & Plan:   Problem List Items Addressed This Visit   None Visit Diagnoses     Abnormal heart rate    -  Primary   Relevant Orders   EKG 12-Lead (Completed)   Cardiac event monitor   Lipid panel (Completed)   CBC with Differential/Platelet (Completed)   TSH (Completed)   Ambulatory referral to Cardiology   D-Dimer, Quantitative (Completed)   Troponin I (High Sensitivity)        No orders of the defined types were placed in this encounter.   I, Dr. Roma Schanz, DO, personally preformed the services described in this documentation.  All  medical record entries made by the scribe were at my direction and in my presence.  I have reviewed the chart and discharge instructions (if applicable) and agree that the record reflects my personal performance and is accurate and complete. 04/02/2021   I,Shehryar Baig,acting as a Education administrator for [employer], DO.,have documented all relevant documentation on the behalf of Ann Held, DO,as directed by  Ann Held, DO while in the presence of Ann Held, DO.   Ann Held, DO

## 2021-04-03 ENCOUNTER — Encounter: Payer: Self-pay | Admitting: Family Medicine

## 2021-04-03 DIAGNOSIS — R009 Unspecified abnormalities of heart beat: Secondary | ICD-10-CM | POA: Insufficient documentation

## 2021-04-04 ENCOUNTER — Other Ambulatory Visit: Payer: Self-pay | Admitting: Family Medicine

## 2021-04-29 ENCOUNTER — Ambulatory Visit (INDEPENDENT_AMBULATORY_CARE_PROVIDER_SITE_OTHER): Payer: Medicare Other | Admitting: Family Medicine

## 2021-04-29 ENCOUNTER — Encounter: Payer: Self-pay | Admitting: Family Medicine

## 2021-04-29 ENCOUNTER — Other Ambulatory Visit: Payer: Self-pay

## 2021-04-29 VITALS — BP 122/68 | HR 66 | Temp 98.3°F | Resp 16 | Ht 72.0 in | Wt 213.7 lb

## 2021-04-29 DIAGNOSIS — E782 Mixed hyperlipidemia: Secondary | ICD-10-CM | POA: Diagnosis not present

## 2021-04-29 DIAGNOSIS — R739 Hyperglycemia, unspecified: Secondary | ICD-10-CM | POA: Diagnosis not present

## 2021-04-29 DIAGNOSIS — U071 COVID-19: Secondary | ICD-10-CM | POA: Diagnosis not present

## 2021-04-29 DIAGNOSIS — Z8616 Personal history of COVID-19: Secondary | ICD-10-CM | POA: Insufficient documentation

## 2021-04-29 DIAGNOSIS — I10 Essential (primary) hypertension: Secondary | ICD-10-CM

## 2021-04-29 LAB — COMPREHENSIVE METABOLIC PANEL
ALT: 21 U/L (ref 0–53)
AST: 20 U/L (ref 0–37)
Albumin: 4.4 g/dL (ref 3.5–5.2)
Alkaline Phosphatase: 55 U/L (ref 39–117)
BUN: 14 mg/dL (ref 6–23)
CO2: 31 mEq/L (ref 19–32)
Calcium: 9.4 mg/dL (ref 8.4–10.5)
Chloride: 101 mEq/L (ref 96–112)
Creatinine, Ser: 0.89 mg/dL (ref 0.40–1.50)
GFR: 87.72 mL/min (ref >60.00)
Glucose, Bld: 132 mg/dL — ABNORMAL HIGH (ref 70–99)
Potassium: 4.2 mEq/L (ref 3.5–5.1)
Sodium: 140 mEq/L (ref 135–145)
Total Bilirubin: 0.7 mg/dL (ref 0.2–1.2)
Total Protein: 7 g/dL (ref 6.0–8.3)

## 2021-04-29 LAB — TSH: TSH: 0.91 u[IU]/mL (ref 0.35–5.50)

## 2021-04-29 LAB — HEMOGLOBIN A1C: Hgb A1c MFr Bld: 6.2 % (ref 4.6–6.5)

## 2021-04-29 NOTE — Assessment & Plan Note (Signed)
Recovered from June 3 covid infection, no residual complaints. Did notice some labile pulses after infection as low as 57 and as high as 138 but that has resolved he sees cardiology next month

## 2021-04-29 NOTE — Progress Notes (Signed)
Subjective:    Patient ID: William Phillips, male    DOB: 1951-08-25, 70 y.o.   MRN: [id number]  Chief Complaint  Patient presents with   Annual Exam    Pt has no concerns or problem.    HPI Patient is in today for follow up on chronic medical concerns. No recent febrile illness or hospitalizations.   Past Medical History:  Diagnosis Date   Allergic rhinitis    Ankle fracture 1998   Atrial flutter ([place])    a. s/p CTI ablation by Dr Rayann Heman 11/2014   Back pain 08/05/2015   BPH (benign prostatic hyperplasia) 08/26/2015   Ganglion cyst of dorsum of right wrist 04/08/2017   GERD (gastroesophageal reflux disease)    Hammer toe of right foot 05/06/2013   History of carpal tunnel syndrome    Hyperlipidemia    Hypertension    Insect bite 04/23/2015   OA (osteoarthritis) 02/06/2013   knees   Pedal edema 04/08/2017   Preventative health care 04/27/2011   Had normal colonoscopy in 2011     Past Surgical History:  Procedure Laterality Date   ABLATION  11-24-14   CTI ablation by Dr Rayann Heman   ANKLE SURGERY     ATRIAL FLUTTER ABLATION N/A 11/24/2014   Procedure: ATRIAL FLUTTER ABLATION;  Surgeon: Thompson Grayer, MD;  Location: [facility] CATH LAB;  Service: Cardiovascular;  Laterality: N/A;   Carpel tunnel     COLONOSCOPY  08/04/2002   Normal Exam- Dr Fransico Meadow HERNIA REPAIR  2008   TONSILLECTOMY      Family History  Problem Relation Age of Onset   Coronary artery disease Father        aortic valve disease   Heart attack Father 37   COPD Father    Hypertension Father    Hyperlipidemia Father    Dementia Father    Diabetes type II Maternal Grandfather    Diabetes Maternal Grandfather    Arthritis Mother    Stroke Mother    Diabetes Sister    Hyperlipidemia Other    Hypertension Other     Social History   Socioeconomic History   Marital status: Married    Spouse name: Not on file   Number of children: 1   Years of education: Not on file   Highest education level: Not on  file  Occupational History    Comment: Sales  Tobacco Use   Smoking status: Former   Smokeless tobacco: Never   Tobacco comments:    5-10 pack year history  Vaping Use   Vaping Use: Never used  Substance and Sexual Activity   Alcohol use: Yes    Comment: 2-3 glasses of wine per day   Drug use: No   Sexual activity: Yes  Other Topics Concern   Not on file  Social History Narrative   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[employer])   Married- 55 years   85 daughter -Paramedic in   ([place]   Previous smoker - 5-10 pack yrs     Alcohol use-yes (2-3 glasses of wine)     Social Determinants of Health   Financial Resource Strain: Not on file  Food Insecurity: Not on file  Transportation Needs: Not on file  Physical Activity: Not on file  Stress: Not on file  Social Connections: Not on file  Intimate Partner Violence: Not on file    Outpatient Medications Prior to Visit  Medication Sig Dispense Refill  amLODipine (NORVASC) 10 MG tablet TAKE ONE-HALF TABLET BY  MOUTH TWICE DAILY 90 tablet 3   aspirin 81 MG tablet Take 81 mg by mouth daily.     fexofenadine (ALLEGRA) 180 MG tablet Take 1 tablet (180 mg total) by mouth daily.     fluticasone (FLONASE) 50 MCG/ACT nasal spray Place 2 sprays into both nostrils daily as needed for allergies or rhinitis.  2   metoprolol succinate (TOPROL-XL) 100 MG 24 hr tablet TAKE 1 TABLET BY MOUTH  DAILY WITH OR IMMEDIATELY  FOLLOWING A MEAL 90 tablet 3   Multiple Vitamin (MULTIVITAMIN) tablet Take 1 tablet by mouth daily.     omeprazole (PRILOSEC) 20 MG capsule TAKE 1 CAPSULE BY MOUTH  DAILY 90 capsule 3   quinapril-hydrochlorothiazide (ACCURETIC) 20-12.5 MG tablet TAKE 1 TABLET BY MOUTH  DAILY 90 tablet 1   sildenafil (REVATIO) 20 MG tablet Take 1-4 tablets (20-80 mg total) by mouth daily as needed. 90 tablet 1   simvastatin (ZOCOR) 20 MG tablet TAKE 1 TABLET BY MOUTH AT  BEDTIME 90 tablet 3   tamsulosin (FLOMAX)  0.4 MG CAPS capsule TAKE 1 CAPSULE BY MOUTH  DAILY (Patient not taking: Reported on 04/29/2021) 90 capsule 3   COVID-19 mRNA vaccine, Moderna, 100 MCG/0.5ML injection INJECT AS DIRECTED (Patient not taking: Reported on 04/02/2021) .25 mL 0   No facility-administered medications prior to visit.    Allergies  Allergen Reactions   Penicillins     REACTION: swelling , irriation    Review of Systems  Constitutional:  Negative for fever and malaise/fatigue.  HENT:  Negative for congestion.   Eyes:  Negative for blurred vision.  Respiratory:  Negative for shortness of breath.   Cardiovascular:  Positive for palpitations. Negative for chest pain and leg swelling.  Gastrointestinal:  Negative for abdominal pain, blood in stool and nausea.  Genitourinary:  Negative for dysuria and frequency.  Musculoskeletal:  Negative for falls.  Skin:  Negative for rash.  Neurological:  Negative for dizziness, loss of consciousness and headaches.  Endo/Heme/Allergies:  Negative for environmental allergies.  Psychiatric/Behavioral:  Negative for depression. The patient is not nervous/anxious.       Objective:    Physical Exam Constitutional:      General: He is not in acute distress.    Appearance: Normal appearance. He is not ill-appearing or toxic-appearing.  HENT:     Head: Normocephalic and atraumatic.     Right Ear: External ear normal.     Left Ear: External ear normal.     Nose: Nose normal. No congestion.  Eyes:     General:        Right eye: No discharge.        Left eye: No discharge.  Cardiovascular:     Rate and Rhythm: Regular rhythm.  Pulmonary:     Effort: Pulmonary effort is normal. No respiratory distress.  Chest:     Chest wall: No tenderness.  Abdominal:     Tenderness: There is no guarding.  Musculoskeletal:     Cervical back: Normal range of motion.     Right lower leg: No edema.  Skin:    Findings: No erythema or rash.  Neurological:     Mental Status: He is alert  and oriented to person, place, and time.  Psychiatric:        Behavior: Behavior normal.    BP 122/68   Pulse 66   Temp 98.3 F (36.8 C)   Resp 16  Ht 6' (1.829 m)   Wt 213 lb 11.2 oz (96.9 kg)   SpO2 96%   BMI 28.98 kg/m  Wt Readings from Last 3 Encounters:  04/29/21 213 lb 11.2 oz (96.9 kg)  04/02/21 205 lb (93 kg)  02/16/20 199 lb 6.4 oz (90.4 kg)    Diabetic Foot Exam - Simple   No data filed    Lab Results  Component Value Date   WBC 10.2 04/02/2021   HGB 16.1 04/02/2021   HCT 48.1 04/02/2021   PLT 261.0 04/02/2021   GLUCOSE 125 (H) 10/23/2020   CHOL 184 04/02/2021   TRIG 189.0 (H) 04/02/2021   HDL 68.40 04/02/2021   LDLCALC 78 04/02/2021   ALT 21 10/23/2020   AST 23 10/23/2020   NA 135 10/23/2020   K 4.6 10/23/2020   CL 98 10/23/2020   CREATININE 0.95 10/23/2020   BUN 11 10/23/2020   CO2 30 10/23/2020   TSH 1.27 04/02/2021   PSA 2.33 02/16/2020   HGBA1C 6.0 10/23/2020   MICROALBUR 0.7 10/19/2015    Lab Results  Component Value Date   TSH 1.27 04/02/2021   Lab Results  Component Value Date   WBC 10.2 04/02/2021   HGB 16.1 04/02/2021   HCT 48.1 04/02/2021   MCV 97.9 04/02/2021   PLT 261.0 04/02/2021   Lab Results  Component Value Date   NA 135 10/23/2020   K 4.6 10/23/2020   CO2 30 10/23/2020   GLUCOSE 125 (H) 10/23/2020   BUN 11 10/23/2020   CREATININE 0.95 10/23/2020   BILITOT 0.7 10/23/2020   ALKPHOS 51 10/23/2020   AST 23 10/23/2020   ALT 21 10/23/2020   PROT 7.0 10/23/2020   ALBUMIN 4.5 10/23/2020   CALCIUM 9.5 10/23/2020   ANIONGAP 15 09/25/2014   GFR 82.23 10/23/2020   Lab Results  Component Value Date   CHOL 184 04/02/2021   Lab Results  Component Value Date   HDL 68.40 04/02/2021   Lab Results  Component Value Date   LDLCALC 78 04/02/2021   Lab Results  Component Value Date   TRIG 189.0 (H) 04/02/2021   Lab Results  Component Value Date   CHOLHDL 3 04/02/2021   Lab Results  Component Value Date    HGBA1C 6.0 10/23/2020       Assessment & Plan:   Problem List Items Addressed This Visit     Hyperlipidemia, mixed    Encourage heart healthy diet such as MIND or DASH diet, increase exercise, avoid trans fats, simple carbohydrates and processed foods, consider a krill or fish or flaxseed oil cap daily.        Essential hypertension - Primary    Well controlled, no changes to meds. Encouraged heart healthy diet such as the DASH diet and exercise as tolerated.        Relevant Orders   Comprehensive metabolic panel   TSH   Hyperglycemia    hgba1c acceptable, minimize simple carbs. Increase exercise as tolerated.        Relevant Orders   Hemoglobin A1c   COVID-19    Recovered from June 3 covid infection, no residual complaints. Did notice some labile pulses after infection as low as 57 and as high as 138 but that has resolved he sees cardiology next month        I have discontinued [name] "Ken"'s COVID-19 mRNA vaccine Levan Hurst). I am also having him maintain his multivitamin, aspirin, metoprolol succinate, tamsulosin, omeprazole, simvastatin, sildenafil, fluticasone, fexofenadine,  amLODipine, and quinapril-hydrochlorothiazide.  No orders of the defined types were placed in this encounter.    Penni Homans, MD

## 2021-04-29 NOTE — Assessment & Plan Note (Signed)
Encourage heart healthy diet such as MIND or DASH diet, increase exercise, avoid trans fats, simple carbohydrates and processed foods, consider a krill or fish or flaxseed oil cap daily.  °

## 2021-04-29 NOTE — Patient Instructions (Addendum)
Paxlovid or Molpurnivir is the new COVID medication we can give you if you get COVID so make sure you test if you have symptoms because we have to treat by day 5 of symptoms for it to be effective. If you are positive let us know so we can treat. If a home test is negative and your symptoms are persistent get a PCR test. Can check testing locations at [email or web address] If you are positive we will make an appointment with [place] and we will send in Paxlovid if you would like it. Check with your pharmacy before we meet to confirm they have it in stock, if they do not then we can get the prescription at the [facility]    High blood pressure (hypertension) is when the force of blood pumping through the arteries is too strong. The arteries are the blood vessels that carry blood from the heart throughout the body. Hypertension forces the heart to work harder to pump blood and may cause arteries to become narrow or stiff. Untreated or uncontrolled hypertension can cause a heart attack, heart failure, a stroke, kidney disease, and otherproblems. A blood pressure reading consists of a higher number over a lower number. Ideally, your blood pressure should be below 120/80. The first ("top") number is called the systolic pressure. It is a measure of the pressure in your arteries as your heart beats. The second ("bottom") number is called the diastolic pressure. It is a measure of the pressure in your arteries as theheart relaxes. What are the causes? The exact cause of this condition is not known. There are some conditions thatresult in or are related to high blood pressure. What increases the risk? Some risk factors for high blood pressure are under your control. The following factors may make you more likely to develop this condition: Smoking. Having type 2 diabetes mellitus, high cholesterol, or both. Not getting enough exercise or physical activity. Being overweight. Having  too much fat, sugar, calories, or salt (sodium) in your diet. Drinking too much alcohol. Some risk factors for high blood pressure may be difficult or impossible to change. Some of these factors include: Having chronic kidney disease. Having a family history of high blood pressure. Age. Risk increases with age. Race. You may be at higher risk if you are African American. Gender. Men are at higher risk than women before age 68. After age 5, women are at higher risk than men. Having obstructive sleep apnea. Stress. What are the signs or symptoms? High blood pressure may not cause symptoms. Very high blood pressure (hypertensive crisis) may cause: Headache. Anxiety. Shortness of breath. Nosebleed. Nausea and vomiting. Vision changes. Severe chest pain. Seizures. How is this diagnosed? This condition is diagnosed by measuring your blood pressure while you are seated, with your arm resting on a flat surface, your legs uncrossed, and your feet flat on the floor. The cuff of the blood pressure monitor will be placed directly against the skin of your upper arm at the level of your heart. It should be measured at least twice using the same arm. Certain conditions cancause a difference in blood pressure between your right and left arms. Certain factors can cause blood pressure readings to be lower or higher than normal for a short period of time: When your blood pressure is higher when you are in a health care provider's office than when you are at home, this is called white coat hypertension. Most people with this condition do not  need medicines. When your blood pressure is higher at home than when you are in a health care provider's office, this is called masked hypertension. Most people with this condition may need medicines to control blood pressure. If you have a high blood pressure reading during one visit or you have normal blood pressure with other risk factors, you may be asked to: Return  on a different day to have your blood pressure checked again. Monitor your blood pressure at home for 1 week or longer. If you are diagnosed with hypertension, you may have other blood or imaging tests to help your health care provider understand your overall risk for otherconditions. How is this treated? This condition is treated by making healthy lifestyle changes, such as eating healthy foods, exercising more, and reducing your alcohol intake. Your health care provider may prescribe medicine if lifestyle changes are not enough to get your blood pressure under control, and if: Your systolic blood pressure is above 130. Your diastolic blood pressure is above 80. Your personal target blood pressure may vary depending on your medicalconditions, your age, and other factors. Follow these instructions at home: Eating and drinking  Eat a diet that is high in fiber and potassium, and low in sodium, added sugar, and fat. An example eating plan is called the DASH (Dietary Approaches to Stop Hypertension) diet. To eat this way: Eat plenty of fresh fruits and vegetables. Try to fill one half of your plate at each meal with fruits and vegetables. Eat whole grains, such as whole-wheat pasta, brown rice, or whole-grain bread. Fill about one fourth of your plate with whole grains. Eat or drink low-fat dairy products, such as skim milk or low-fat yogurt. Avoid fatty cuts of meat, processed or cured meats, and poultry with skin. Fill about one fourth of your plate with lean proteins, such as fish, chicken without skin, beans, eggs, or tofu. Avoid pre-made and processed foods. These tend to be higher in sodium, added sugar, and fat. Reduce your daily sodium intake. Most people with hypertension should eat less than 1,500 mg of sodium a day. Do not drink alcohol if: Your health care provider tells you not to drink. You are pregnant, may be pregnant, or are planning to become pregnant. If you drink alcohol: Limit  how much you use to: 0-1 drink a day for women. 0-2 drinks a day for men. Be aware of how much alcohol is in your drink. In the [place] one drink equals one 12 oz bottle of beer (355 mL), one 5 oz glass of wine (148 mL), or one 1 oz glass of hard liquor (44 mL).  Lifestyle  Work with your health care provider to maintain a healthy body weight or to lose weight. Ask what an ideal weight is for you. Get at least 30 minutes of exercise most days of the week. Activities may include walking, swimming, or biking. Include exercise to strengthen your muscles (resistance exercise), such as Pilates or lifting weights, as part of your weekly exercise routine. Try to do these types of exercises for 30 minutes at least 3 days a week. Do not use any products that contain nicotine or tobacco, such as cigarettes, e-cigarettes, and chewing tobacco. If you need help quitting, ask your health care provider. Monitor your blood pressure at home as told by your health care provider. Keep all follow-up visits as told by your health care provider. This is important.  Medicines Take over-the-counter and prescription medicines only as told by your  health care provider. Follow directions carefully. Blood pressure medicines must be taken as prescribed. Do not skip doses of blood pressure medicine. Doing this puts you at risk for problems and can make the medicine less effective. Ask your health care provider about side effects or reactions to medicines that you should watch for. Contact a health care provider if you: Think you are having a reaction to a medicine you are taking. Have headaches that keep coming back (recurring). Feel dizzy. Have swelling in your ankles. Have trouble with your vision. Get help right away if you: Develop a severe headache or confusion. Have unusual weakness or numbness. Feel faint. Have severe pain in your chest or abdomen. Vomit repeatedly. Have trouble  breathing. Summary Hypertension is when the force of blood pumping through your arteries is too strong. If this condition is not controlled, it may put you at risk for serious complications. Your personal target blood pressure may vary depending on your medical conditions, your age, and other factors. For most people, a normal blood pressure is less than 120/80. Hypertension is treated with lifestyle changes, medicines, or a combination of both. Lifestyle changes include losing weight, eating a healthy, low-sodium diet, exercising more, and limiting alcohol. This information is not intended to replace advice given to you by your health care provider. Make sure you discuss any questions you have with your healthcare provider. Document Revised: 06/09/2018 Document Reviewed: 06/09/2018 Elsevier Patient Education  [employer].

## 2021-04-29 NOTE — Assessment & Plan Note (Signed)
Well controlled, no changes to meds. Encouraged heart healthy diet such as the DASH diet and exercise as tolerated.  °

## 2021-04-29 NOTE — Assessment & Plan Note (Signed)
hgba1c acceptable, minimize simple carbs. Increase exercise as tolerated.  

## 2021-05-26 NOTE — Progress Notes (Signed)
Referring-Stacy Charlett Blake, MD Reason for referral-atrial flutter  HPI: 70 year old male for atrial flutter at request of Willette Alma, MD.  Patient seen previously but not since April 2019.  Echocardiogram February 2016 showed normal LV function, grade 1 diastolic dysfunction.  Abdominal ultrasound April 2019 showed no aneurysm.  Previously diagnosed with atrial flutter in December 2015.  He subsequently had atrial flutter ablation February 2016.  Patient previously had COVID infection and he states he noticed afterwards his heart rate could be as low as 57 and as high as 138. Cardiology now asked to evaluate.  Note I personally reviewed the patient's electrocardiogram from June and it demonstrated recurrent atrial flutter.  Of note this occurred following a COVID infection.  He did not feel palpitations with this.  He merely was checking his blood pressure and his heart rate was noted to be elevated.  He otherwise denies dyspnea on exertion, orthopnea, PND, pedal edema, exertional chest pain, bleeding or syncope.  Current Outpatient Medications  Medication Sig Dispense Refill   amLODipine (NORVASC) 10 MG tablet TAKE ONE-HALF TABLET BY  MOUTH TWICE DAILY 90 tablet 3   fexofenadine (ALLEGRA) 180 MG tablet Take 1 tablet (180 mg total) by mouth daily.     fluticasone (FLONASE) 50 MCG/ACT nasal spray Place 2 sprays into both nostrils daily as needed for allergies or rhinitis.  2   metoprolol succinate (TOPROL-XL) 100 MG 24 hr tablet TAKE 1 TABLET BY MOUTH  DAILY WITH OR IMMEDIATELY  FOLLOWING A MEAL 90 tablet 3   Multiple Vitamin (MULTIVITAMIN) tablet Take 1 tablet by mouth daily.     omeprazole (PRILOSEC) 20 MG capsule TAKE 1 CAPSULE BY MOUTH  DAILY 90 capsule 3   quinapril-hydrochlorothiazide (ACCURETIC) 20-12.5 MG tablet TAKE 1 TABLET BY MOUTH  DAILY 90 tablet 1   sildenafil (REVATIO) 20 MG tablet Take 1-4 tablets (20-80 mg total) by mouth daily as needed. 90 tablet 1   simvastatin (ZOCOR) 20 MG  tablet TAKE 1 TABLET BY MOUTH AT  BEDTIME 90 tablet 3   tamsulosin (FLOMAX) 0.4 MG CAPS capsule TAKE 1 CAPSULE BY MOUTH  DAILY 90 capsule 3   No current facility-administered medications for this visit.    Allergies  Allergen Reactions   Penicillins     REACTION: swelling , irriation     Past Medical History:  Diagnosis Date   Allergic rhinitis    Ankle fracture 1998   Atrial flutter ([place])    a. s/p CTI ablation by Dr Rayann Heman 11/2014   Back pain 08/05/2015   BPH (benign prostatic hyperplasia) 08/26/2015   Ganglion cyst of dorsum of right wrist 04/08/2017   GERD (gastroesophageal reflux disease)    Hammer toe of right foot 05/06/2013   History of carpal tunnel syndrome    Hyperlipidemia    Hypertension    Insect bite 04/23/2015   OA (osteoarthritis) 02/06/2013   knees   Pedal edema 04/08/2017   Preventative health care 04/27/2011   Had normal colonoscopy in 2011     Past Surgical History:  Procedure Laterality Date   ABLATION  11-24-14   CTI ablation by Dr Rayann Heman   ANKLE SURGERY     ATRIAL FLUTTER ABLATION N/A 11/24/2014   Procedure: ATRIAL FLUTTER ABLATION;  Surgeon: Thompson Grayer, MD;  Location: [facility] C[facility] LAB;  Service: Cardiovascular;  Laterality: N/A;   Carpel tunnel     COLONOSCOPY  08/04/2002   Normal Exam- Dr Fransico Meadow HERNIA REPAIR  2008   TONSILLECTOMY  Social History   Socioeconomic History   Marital status: Married    Spouse name: Not on file   Number of children: 1   Years of education: Not on file   Highest education level: Not on file  Occupational History    Comment: Sales  Tobacco Use   Smoking status: Former   Smokeless tobacco: Never   Tobacco comments:    5-10 pack year history  Vaping Use   Vaping Use: Never used  Substance and Sexual Activity   Alcohol use: Yes    Comment: 2-3 glasses of wine per day   Drug use: No   Sexual activity: Yes  Other Topics Concern   Not on file  Social History Narrative   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, seed, erosion control)   Married- 81 years   21 daughter -Paramedic in   ([place]   Previous smoker - 5-10 pack yrs     Alcohol use-yes (2-3 glasses of wine)     Social Determinants of Radio broadcast assistant Strain: Not on file  Food Insecurity: Not on file  Transportation Needs: Not on file  Physical Activity: Not on file  Stress: Not on file  Social Connections: Not on file  Intimate Partner Violence: Not on file    Family History  Problem Relation Age of Onset   Coronary artery disease Father        aortic valve disease   Heart attack Father 98   COPD Father    Hypertension Father    Hyperlipidemia Father    Dementia Father    Diabetes type II Maternal Grandfather    Diabetes Maternal Grandfather    Arthritis Mother    Stroke Mother    Diabetes Sister    Hyperlipidemia Other    Hypertension Other     ROS: no fevers or chills, productive cough, hemoptysis, dysphasia, odynophagia, melena, hematochezia, dysuria, hematuria, rash, seizure activity, orthopnea, PND, pedal edema, claudication. Remaining systems are negative.  Physical Exam:   Blood pressure 120/78, pulse 64, height 6' (1.829 m), weight 212 lb 6.4 oz (96.3 kg), SpO2 97 %.  General:  Well developed/well nourished in NAD Skin warm/dry Patient not depressed No peripheral clubbing Back-normal HEENT-normal/normal eyelids Neck supple/normal carotid upstroke bilaterally; no bruits; no JVD; no thyromegaly chest - CTA/ normal expansion CV - RRR/normal S1 and S2; no murmurs, rubs or gallops;  PMI nondisplaced Abdomen -NT/ND, no HSM, no mass, + bowel sounds, no bruit 2+ femoral pulses, no bruits Ext-no edema, chords, 2+ DP Neuro-grossly nonfocal  ECG - April 02, 2021-atrial flutter with rapid ventricular response.personally reviewed Today's electrocardiogram shows sinus rhythm at a rate of 64 with no ST changes.  A/P  1 atrial flutter-patient noted to have recurrent  atrial flutter.  Also note he was asymptomatic.  We will continue Toprol for rate control if atrial flutter recurs.  Repeat echocardiogram.  CHA2DS2-VASc is 2 for age greater than 72 and hypertension.  Discontinue aspirin and add apixaban 5 mg twice daily.  I will have Dr. Rayann Heman review again for potential repeat atrial flutter ablation to avoid long-term anticoagulation.  2 hypertension-blood pressure controlled.  Continue present medications and follow.  3 hyperlipidemia-continue statin.  Kirk Ruths, MD

## 2021-06-04 ENCOUNTER — Other Ambulatory Visit: Payer: Self-pay | Admitting: Family Medicine

## 2021-06-05 ENCOUNTER — Ambulatory Visit: Payer: Medicare Other | Admitting: Cardiology

## 2021-06-05 ENCOUNTER — Encounter: Payer: Self-pay | Admitting: Cardiology

## 2021-06-05 ENCOUNTER — Other Ambulatory Visit: Payer: Self-pay

## 2021-06-05 VITALS — BP 120/78 | HR 64 | Ht 72.0 in | Wt 212.4 lb

## 2021-06-05 DIAGNOSIS — I4892 Unspecified atrial flutter: Secondary | ICD-10-CM | POA: Diagnosis not present

## 2021-06-05 DIAGNOSIS — E782 Mixed hyperlipidemia: Secondary | ICD-10-CM

## 2021-06-05 DIAGNOSIS — I10 Essential (primary) hypertension: Secondary | ICD-10-CM

## 2021-06-05 MED ORDER — APIXABAN 5 MG PO TABS: 5.0000 mg | ORAL_TABLET | Freq: Two times a day (BID) | ORAL | 6 refills | Status: DC

## 2021-06-05 MED ORDER — APIXABAN 5 MG PO TABS: 5.0000 mg | ORAL_TABLET | Freq: Two times a day (BID) | ORAL | 3 refills | Status: DC

## 2021-06-05 NOTE — Patient Instructions (Signed)
Medication Instructions:   STOP ASPIRIN  START ELIQUIS 5 MG ONE TABLET TWICE DAILY  *If you need a refill on your cardiac medications before your next appointment, please call your pharmacy*  Testing/Procedures:  Your physician has requested that you have an echocardiogram. Echocardiography is a painless test that uses sound waves to create images of your heart. It provides your doctor with information about the size and shape of your heart and how well your heart's chambers and valves are working. This procedure takes approximately one hour. There are no restrictions for this procedure. [place] [facility]-1 ST FLOOR IMAGING DEPARTMENT   Follow-Up: At [facility], you and your health needs are our priority.  As part of our continuing mission to provide you with exceptional heart care, we have created designated Provider Care Teams.  These Care Teams include your primary Cardiologist (physician) and Advanced Practice Providers (APPs -  Physician Assistants and Nurse Practitioners) who all work together to provide you with the care you need, when you need it.  We recommend signing up for the patient portal called "MyChart".  Sign up information is provided on this After Visit Summary.  MyChart is used to connect with patients for Virtual Visits (Telemedicine).  Patients are able to view lab/test results, encounter notes, upcoming appointments, etc.  Non-urgent messages can be sent to your provider as well.   To learn more about what you can do with MyChart, go to [email or web address].    Your next appointment:   4 month(s)  The format for your next appointment:   In Person  Provider:   Kirk Ruths, MD

## 2021-06-21 ENCOUNTER — Other Ambulatory Visit: Payer: Self-pay

## 2021-06-21 ENCOUNTER — Ambulatory Visit (HOSPITAL_BASED_OUTPATIENT_CLINIC_OR_DEPARTMENT_OTHER)
Admission: RE | Admit: 2021-06-21 | Discharge: 2021-06-21 | Disposition: A | Discharge status: Home / Self Care | Payer: Medicare Other | Source: Ambulatory Visit | Attending: Cardiology | Admitting: Cardiology

## 2021-06-21 DIAGNOSIS — I4892 Unspecified atrial flutter: Secondary | ICD-10-CM | POA: Diagnosis not present

## 2021-06-23 LAB — ECHOCARDIOGRAM COMPLETE
AR max vel: 3.02 cm2
AV Area VTI: 3.05 cm2
AV Area mean vel: 2.72 cm2
AV Mean grad: 4 mmHg
AV Peak grad: 6.9 mmHg
Ao pk vel: 1.31 m/s
Area-P 1/2: 2.08 cm2
Calc EF: 55.9 %
S' Lateral: 3.31 cm
Single Plane A2C EF: 59 %
Single Plane A4C EF: 55 %

## 2021-07-15 ENCOUNTER — Encounter: Payer: Self-pay | Admitting: *Deleted

## 2021-07-15 ENCOUNTER — Ambulatory Visit: Payer: Medicare Other | Admitting: Internal Medicine

## 2021-07-15 ENCOUNTER — Other Ambulatory Visit: Payer: Self-pay

## 2021-07-15 ENCOUNTER — Encounter: Payer: Self-pay | Admitting: Internal Medicine

## 2021-07-15 VITALS — BP 112/72 | HR 70 | Ht 72.0 in | Wt 210.8 lb

## 2021-07-15 DIAGNOSIS — I10 Essential (primary) hypertension: Secondary | ICD-10-CM

## 2021-07-15 DIAGNOSIS — Z01818 Encounter for other preprocedural examination: Secondary | ICD-10-CM | POA: Diagnosis not present

## 2021-07-15 DIAGNOSIS — I4892 Unspecified atrial flutter: Secondary | ICD-10-CM | POA: Diagnosis not present

## 2021-07-15 NOTE — Patient Instructions (Addendum)
Medication Instructions:  Your physician recommends that you continue on your current medications as directed. Please refer to the Current Medication list given to you today. *If you need a refill on your cardiac medications before your next appointment, please call your pharmacy*  Lab Work: None. If you have labs (blood work) drawn today and your tests are completely normal, you will receive your results only by: [place] (if you have MyChart) OR A paper copy in the mail If you have any lab test that is abnormal or we need to change your treatment, we will call you to review the results.  Testing/Procedures: Your physician has recommended that you have an ablation. Catheter ablation is a medical procedure used to treat some cardiac arrhythmias (irregular heartbeats). During catheter ablation, a long, thin, flexible tube is put into a blood vessel in your groin (upper thigh), or neck. This tube is called an ablation catheter. It is then guided to your heart through the blood vessel. Radio frequency waves destroy small areas of heart tissue where abnormal heartbeats may cause an arrhythmia to start. Please see the instruction sheet given to you today.   Follow-Up: At [facility], you and your health needs are our priority.  As part of our continuing mission to provide you with exceptional heart care, we have created designated Provider Care Teams.  These Care Teams include your primary Cardiologist (physician) and Advanced Practice Providers (APPs -  Physician Assistants and Nurse Practitioners) who all work together to provide you with the care you need, when you need it.  We recommend signing up for the patient portal called "MyChart".  Sign up information is provided on this After Visit Summary.  MyChart is used to connect with patients for Virtual Visits (Telemedicine).  Patients are able to view lab/test results, encounter notes, upcoming appointments, etc.  Non-urgent messages can be  sent to your provider as well.   To learn more about what you can do with MyChart, go to [email or web address].    Any Other Special Instructions Will Be Listed Below (If Applicable).  Cardiac Ablation Cardiac ablation is a procedure to destroy (ablate) some heart tissue that is sending bad signals. These bad signals cause problems in heart rhythm. The heart has many areas that make these signals. If there are problems in these areas, they can make the heart beat in a way that is not normal. Destroying some tissues can help make the heart rhythm normal. Tell your doctor about: Any allergies you have. All medicines you are taking. These include vitamins, herbs, eye drops, creams, and over-the-counter medicines. Any problems you or family members have had with medicines that make you fall asleep (anesthetics). Any blood disorders you have. Any surgeries you have had. Any medical conditions you have, such as kidney failure. Whether you are pregnant or may be pregnant. What are the risks? This is a safe procedure. But problems may occur, including: Infection. Bruising and bleeding. Bleeding into the chest. Stroke or blood clots. Damage to nearby areas of your body. Allergies to medicines or dyes. The need for a pacemaker if the normal system is damaged. Failure of the procedure to treat the problem. What happens before the procedure? Medicines Ask your doctor about: Changing or stopping your normal medicines. This is important. Taking aspirin and ibuprofen. Do not take these medicines unless your doctor tells you to take them. Taking other medicines, vitamins, herbs, and supplements. General instructions Follow instructions from your doctor about what you cannot eat or  drink. Plan to have someone take you home from the hospital or clinic. If you will be going home right after the procedure, plan to have someone with you for 24 hours. Ask your doctor what steps will be taken to  prevent infection. What happens during the procedure?  An IV tube will be put into one of your veins. You will be given a medicine to help you relax. The skin on your neck or groin will be numbed. A cut (incision) will be made in your neck or groin. A needle will be put through your cut and into a large vein. A tube (catheter) will be put into the needle. The tube will be moved to your heart. Dye may be put through the tube. This helps your doctor see your heart. Small devices (electrodes) on the tube will send out signals. A type of energy will be used to destroy some heart tissue. The tube will be taken out. Pressure will be held on your cut. This helps stop bleeding. A bandage will be put over your cut. The exact procedure may vary among doctors and hospitals. What happens after the procedure? You will be watched until you leave the hospital or clinic. This includes checking your heart rate, breathing rate, oxygen, and blood pressure. Your cut will be watched for bleeding. You will need to lie still for a few hours. Do not drive for 24 hours or as long as your doctor tells you. Summary Cardiac ablation is a procedure to destroy some heart tissue. This is done to treat heart rhythm problems. Tell your doctor about any medical conditions you may have. Tell him or her about all medicines you are taking to treat them. This is a safe procedure. But problems may occur. These include infection, bruising, bleeding, and damage to nearby areas of your body. Follow what your doctor tells you about food and drink. You may also be told to change or stop some of your medicines. After the procedure, do not drive for 24 hours or as long as your doctor tells you. This information is not intended to replace advice given to you by your health care provider. Make sure you discuss any questions you have with your health care provider. Document Revised: 09/01/2019 Document Reviewed: 09/01/2019 Elsevier  Patient Education  2022 [employer].

## 2021-07-15 NOTE — Progress Notes (Signed)
Electrophysiology Office Note   Date:  07/15/2021   ID:  William Phillips, DOB [birth year], MRN [id number]  PCP:  Mosie Lukes, MD  Cardiologist:  Dr Stanford Breed Primary Electrophysiologist: Thompson Grayer, MD    CC; atrial flutter   History of Present Illness: William Phillips is a 70 y.o. male who presents today for electrophysiology evaluation.   The patient is referred for EP consultation regarding atrial flutter He underwent CTI ablation by me in 2016. He did very well post ablation. [place]. In this setting, he was observed to have atrial flutter.  This has since resolved, without recurrence.  Today, he denies symptoms of palpitations, chest pain, shortness of breath, orthopnea, PND, lower extremity edema, claudication, dizziness, presyncope, syncope, bleeding, or neurologic sequela. The patient is tolerating medications without difficulties and is otherwise without complaint today.    Past Medical History:  Diagnosis Date   Allergic rhinitis    Ankle fracture 1998   Atrial flutter ([place])    a. s/p CTI ablation by Dr Rayann Heman 11/2014   Back pain 08/05/2015   BPH (benign prostatic hyperplasia) 08/26/2015   Ganglion cyst of dorsum of right wrist 04/08/2017   GERD (gastroesophageal reflux disease)    Hammer toe of right foot 05/06/2013   History of carpal tunnel syndrome    Hyperlipidemia    Hypertension    Insect bite 04/23/2015   OA (osteoarthritis) 02/06/2013   knees   Pedal edema 04/08/2017   Preventative health care 04/27/2011   Had normal colonoscopy in 2011    Past Surgical History:  Procedure Laterality Date   ABLATION  11-24-14   CTI ablation by Dr Rayann Heman   ANKLE SURGERY     ATRIAL FLUTTER ABLATION N/A 11/24/2014   Procedure: ATRIAL FLUTTER ABLATION;  Surgeon: Thompson Grayer, MD;  Location: [facility] CATH LAB;  Service: Cardiovascular;  Laterality: N/A;   Carpel tunnel     COLONOSCOPY  08/04/2002   Normal Exam- Dr Fransico Meadow HERNIA REPAIR  2008   TONSILLECTOMY       Current Outpatient Medications  Medication Sig Dispense Refill   amLODipine (NORVASC) 10 MG tablet TAKE ONE-HALF TABLET BY  MOUTH TWICE DAILY 90 tablet 3   apixaban (ELIQUIS) 5 MG TABS tablet Take 1 tablet (5 mg total) by mouth 2 (two) times daily. 180 tablet 3   Clobetasol Propionate 0.05 % shampoo Apply topically 2 (two) times a week.     fexofenadine (ALLEGRA) 180 MG tablet Take 1 tablet (180 mg total) by mouth daily.     fluticasone (FLONASE) 50 MCG/ACT nasal spray Place 2 sprays into both nostrils daily as needed for allergies or rhinitis.  2   ketoconazole (NIZORAL) 2 % shampoo Apply topically 2 (two) times a week.     metoprolol succinate (TOPROL-XL) 100 MG 24 hr tablet TAKE 1 TABLET BY MOUTH  DAILY WITH OR IMMEDIATELY  FOLLOWING A MEAL 90 tablet 3   Multiple Vitamin (MULTIVITAMIN) tablet Take 1 tablet by mouth daily.     omeprazole (PRILOSEC) 20 MG capsule TAKE 1 CAPSULE BY MOUTH  DAILY 90 capsule 3   quinapril-hydrochlorothiazide (ACCURETIC) 20-12.5 MG tablet TAKE 1 TABLET BY MOUTH  DAILY 90 tablet 1   sildenafil (REVATIO) 20 MG tablet Take 1-4 tablets (20-80 mg total) by mouth daily as needed. 90 tablet 1   simvastatin (ZOCOR) 20 MG tablet TAKE 1 TABLET BY MOUTH AT  BEDTIME 90 tablet  3   tamsulosin (FLOMAX) 0.4 MG CAPS capsule TAKE 1 CAPSULE BY MOUTH  DAILY 90 capsule 3   No current facility-administered medications for this visit.    Allergies:   Penicillins   Social History:  The patient  reports that he has quit smoking. He has never used smokeless tobacco. He reports current alcohol use. He reports that he does not use drugs.   Family History:  The patient's  family history includes Arthritis in his mother; COPD in his father; Coronary artery disease in his father; Dementia in his father; Diabetes in his maternal grandfather and sister; Diabetes type II in his maternal grandfather; Heart attack (age of onset: 27) in his  father; Hyperlipidemia in his father and another family member; Hypertension in his father and another family member; Stroke in his mother.    ROS:  Please see the history of present illness.   All other systems are personally reviewed and negative.    PHYSICAL EXAM: VS:  BP 112/72   Pulse 70   Ht 6' (1.829 m)   Wt 210 lb 12.8 oz (95.6 kg)   SpO2 96%   BMI 28.59 kg/m  , BMI Body mass index is 28.59 kg/m. GEN: Well nourished, well developed, in no acute distress HEENT: normal Neck: no JVD, carotid bruits, or masses Cardiac: RRR; no murmurs, rubs, or gallops,no edema  Respiratory:  clear to auscultation bilaterally, normal work of breathing GI: soft, nontender, nondistended, + BS MS: no deformity or atrophy Skin: warm and dry  Neuro:  Strength and sensation are intact Psych: euthymic mood, full affect  EKG:  EKG is ordered today. The ekg ordered today is personally reviewed and shows sinus rhythm  Ekg from 04/02/21 is personally reviewed and shows 2:1 atrial flutter, likely typical   Recent Labs: 04/02/2021: Hemoglobin 16.1; Platelets 261.0 04/29/2021: ALT 21; BUN 14; Creatinine, Ser 0.89; Potassium 4.2; Sodium 140; TSH 0.91  personally reviewed   Lipid Panel     Component Value Date/Time   CHOL 184 04/02/2021 1222   TRIG 189.0 (H) 04/02/2021 1222   HDL 68.40 04/02/2021 1222   CHOLHDL 3 04/02/2021 1222   VLDL 37.8 04/02/2021 1222   LDLCALC 78 04/02/2021 1222   personally reviewed   Wt Readings from Last 3 Encounters:  07/15/21 210 lb 12.8 oz (95.6 kg)  06/05/21 212 lb 6.4 oz (96.3 kg)  04/29/21 213 lb 11.2 oz (96.9 kg)      Other studies personally reviewed: Additional studies/ records that were reviewed today include: Dr Lonia Skinner notes, prior echo, prior ablation  Review of the above records today demonstrates: as above   ASSESSMENT AND PLAN:  1.  Recurrent atrial flutter (likely typical) S/p CTI ablation by me 11/24/2014.  Ekg from 6/22 reveals recurrent  atrial flutter.  I suspect that this is also isthmus dependant. Therapeutic strategies for atrial flutter including medicine and ablation were discussed in detail with the patient today. Risk, benefits, and alternatives to EP study and radiofrequency ablation were also discussed in detail today. These risks include but are not limited to stroke, bleeding, vascular damage, tamponade, perforation, damage to the heart and other structures, AV block requiring pacemaker, worsening renal function, and death. The patient understands these risk and wishes to proceed.  We will therefore proceed with catheter ablation at the next available time.   Risks, benefits and potential toxicities for medications prescribed and/or refilled reviewed with patient today.    Signed, Thompson Grayer, MD  07/15/2021 11:07 AM  [place] [address] [phone] (office) [phone] (fax)

## 2021-07-16 ENCOUNTER — Telehealth: Payer: Self-pay | Admitting: Internal Medicine

## 2021-07-16 NOTE — Telephone Encounter (Signed)
Patient rescheduled ablation to Dec 1, will arrive to hospital at 8:30 am. Rescheduled labs to Nov 10.  Verbalized understanding and read back new information.

## 2021-07-16 NOTE — Telephone Encounter (Signed)
William Phillips is calling requesting a callback to reschedule his ablation for a later date. Please advise.

## 2021-08-05 ENCOUNTER — Other Ambulatory Visit: Payer: Medicare Other

## 2021-08-07 DIAGNOSIS — D225 Melanocytic nevi of trunk: Secondary | ICD-10-CM | POA: Diagnosis not present

## 2021-08-07 DIAGNOSIS — C44519 Basal cell carcinoma of skin of other part of trunk: Secondary | ICD-10-CM | POA: Diagnosis not present

## 2021-08-07 DIAGNOSIS — L218 Other seborrheic dermatitis: Secondary | ICD-10-CM | POA: Diagnosis not present

## 2021-08-07 DIAGNOSIS — D485 Neoplasm of uncertain behavior of skin: Secondary | ICD-10-CM | POA: Diagnosis not present

## 2021-08-07 DIAGNOSIS — D1801 Hemangioma of skin and subcutaneous tissue: Secondary | ICD-10-CM | POA: Diagnosis not present

## 2021-08-07 DIAGNOSIS — Z85828 Personal history of other malignant neoplasm of skin: Secondary | ICD-10-CM | POA: Diagnosis not present

## 2021-08-22 ENCOUNTER — Other Ambulatory Visit: Payer: Medicare Other | Admitting: *Deleted

## 2021-08-22 ENCOUNTER — Other Ambulatory Visit: Payer: Self-pay

## 2021-08-22 DIAGNOSIS — Z01818 Encounter for other preprocedural examination: Secondary | ICD-10-CM | POA: Diagnosis not present

## 2021-08-22 DIAGNOSIS — I4892 Unspecified atrial flutter: Secondary | ICD-10-CM

## 2021-08-22 DIAGNOSIS — I10 Essential (primary) hypertension: Secondary | ICD-10-CM

## 2021-08-22 LAB — CBC WITH DIFFERENTIAL/PLATELET
Basophils Absolute: 0 10*3/uL (ref 0.0–0.2)
Basos: 1 %
EOS (ABSOLUTE): 0.1 10*3/uL (ref 0.0–0.4)
Eos: 2 %
Hematocrit: 43.8 % (ref 37.5–51.0)
Hemoglobin: 14.5 g/dL (ref 13.0–17.7)
Immature Grans (Abs): 0 10*3/uL (ref 0.0–0.1)
Immature Granulocytes: 1 %
Lymphocytes Absolute: 1.9 10*3/uL (ref 0.7–3.1)
Lymphs: 32 %
MCH: 32.4 pg (ref 26.6–33.0)
MCHC: 33.1 g/dL (ref 31.5–35.7)
MCV: 98 fL — ABNORMAL HIGH (ref 79–97)
Monocytes Absolute: 0.5 10*3/uL (ref 0.1–0.9)
Monocytes: 9 %
Neutrophils Absolute: 3.4 10*3/uL (ref 1.4–7.0)
Neutrophils: 55 %
Platelets: 167 10*3/uL (ref 150–450)
RBC: 4.47 x10E6/uL (ref 4.14–5.80)
RDW: 12.2 % (ref 11.6–15.4)
WBC: 6 10*3/uL (ref 3.4–10.8)

## 2021-08-22 LAB — BASIC METABOLIC PANEL
BUN/Creatinine Ratio: 17 (ref 10–24)
BUN: 15 mg/dL (ref 8–27)
CO2: 27 mmol/L (ref 20–29)
Calcium: 9.2 mg/dL (ref 8.6–10.2)
Chloride: 99 mmol/L (ref 96–106)
Creatinine, Ser: 0.87 mg/dL (ref 0.76–1.27)
Glucose: 165 mg/dL — ABNORMAL HIGH (ref 70–99)
Potassium: 4.1 mmol/L (ref 3.5–5.2)
Sodium: 137 mmol/L (ref 134–144)
eGFR: 93 mL/min/{1.73_m2} (ref >59)

## 2021-09-11 NOTE — Pre-Procedure Instructions (Signed)
Attempted to call patient regarding procedure intstructions.  Left voice mail on the following items: Arrival time 0830 Nothing to eat or drink after midnight No meds AM of procedure Responsible person to drive you home and stay with you for 24 hrs  Have you missed any doses of anti-coagulant Eliquis- take both doses today, none in the morning

## 2021-09-12 ENCOUNTER — Encounter (HOSPITAL_COMMUNITY)
Admission: RE | Disposition: A | Discharge status: Home / Self Care | Payer: Self-pay | Source: Home / Self Care | Attending: Internal Medicine

## 2021-09-12 ENCOUNTER — Ambulatory Visit (HOSPITAL_COMMUNITY): Payer: Medicare Other | Admitting: Anesthesiology

## 2021-09-12 ENCOUNTER — Encounter (HOSPITAL_COMMUNITY): Payer: Self-pay | Admitting: Internal Medicine

## 2021-09-12 ENCOUNTER — Other Ambulatory Visit: Payer: Self-pay

## 2021-09-12 ENCOUNTER — Ambulatory Visit (HOSPITAL_COMMUNITY)
Admission: RE | Admit: 2021-09-12 | Discharge: 2021-09-12 | Disposition: A | Discharge status: Home / Self Care | Payer: Medicare Other | Attending: Internal Medicine | Admitting: Internal Medicine

## 2021-09-12 DIAGNOSIS — I10 Essential (primary) hypertension: Secondary | ICD-10-CM | POA: Diagnosis not present

## 2021-09-12 DIAGNOSIS — K219 Gastro-esophageal reflux disease without esophagitis: Secondary | ICD-10-CM | POA: Diagnosis not present

## 2021-09-12 DIAGNOSIS — Z8616 Personal history of COVID-19: Secondary | ICD-10-CM | POA: Diagnosis not present

## 2021-09-12 DIAGNOSIS — Z7901 Long term (current) use of anticoagulants: Secondary | ICD-10-CM | POA: Insufficient documentation

## 2021-09-12 DIAGNOSIS — E782 Mixed hyperlipidemia: Secondary | ICD-10-CM | POA: Diagnosis not present

## 2021-09-12 DIAGNOSIS — I4892 Unspecified atrial flutter: Secondary | ICD-10-CM | POA: Insufficient documentation

## 2021-09-12 HISTORY — PX: A-FLUTTER ABLATION: EP1230

## 2021-09-12 SURGERY — A-FLUTTER ABLATION
Anesthesia: General

## 2021-09-12 MED ORDER — MIDAZOLAM HCL 5 MG/5ML IJ SOLN
INTRAMUSCULAR | Status: DC | PRN
  Administered 2021-09-12: 2 mg via INTRAVENOUS

## 2021-09-12 MED ORDER — ISOPROTERENOL HCL 0.2 MG/ML IJ SOLN
INTRAMUSCULAR | Status: AC
  Filled 2021-09-12: qty 5

## 2021-09-12 MED ORDER — SODIUM CHLORIDE 0.9 % IV SOLN: 250.0000 mL | INTRAVENOUS | Status: DC | PRN

## 2021-09-12 MED ORDER — FENTANYL CITRATE (PF) 250 MCG/5ML IJ SOLN
INTRAMUSCULAR | Status: DC | PRN
  Administered 2021-09-12: 100 ug via INTRAVENOUS

## 2021-09-12 MED ORDER — ACETAMINOPHEN 325 MG PO TABS: 650.0000 mg | ORAL_TABLET | ORAL | Status: DC | PRN

## 2021-09-12 MED ORDER — ONDANSETRON HCL 4 MG/2ML IJ SOLN
INTRAMUSCULAR | Status: DC | PRN
  Administered 2021-09-12: 4 mg via INTRAVENOUS

## 2021-09-12 MED ORDER — SUGAMMADEX SODIUM 200 MG/2ML IV SOLN
INTRAVENOUS | Status: DC | PRN
  Administered 2021-09-12: 200 mg via INTRAVENOUS

## 2021-09-12 MED ORDER — HEPARIN (PORCINE) IN NACL 2000-0.9 UNIT/L-% IV SOLN
INTRAVENOUS | Status: AC
  Filled 2021-09-12: qty 1000

## 2021-09-12 MED ORDER — HYDROCODONE-ACETAMINOPHEN 5-325 MG PO TABS
1.0000 | ORAL_TABLET | ORAL | Status: DC | PRN
  Filled 2021-09-12: qty 2

## 2021-09-12 MED ORDER — PHENYLEPHRINE HCL-NACL 20-0.9 MG/250ML-% IV SOLN
INTRAVENOUS | Status: DC | PRN
  Administered 2021-09-12: 50 ug/min via INTRAVENOUS

## 2021-09-12 MED ORDER — HEPARIN (PORCINE) IN NACL 1000-0.9 UT/500ML-% IV SOLN
INTRAVENOUS | Status: DC | PRN
  Administered 2021-09-12 (×2): 500 mL

## 2021-09-12 MED ORDER — HEPARIN (PORCINE) IN NACL 1000-0.9 UT/500ML-% IV SOLN
INTRAVENOUS | Status: AC
  Filled 2021-09-12: qty 500

## 2021-09-12 MED ORDER — DEXAMETHASONE SODIUM PHOSPHATE 10 MG/ML IJ SOLN
INTRAMUSCULAR | Status: DC | PRN
  Administered 2021-09-12: 10 mg via INTRAVENOUS

## 2021-09-12 MED ORDER — SODIUM CHLORIDE 0.9% FLUSH: 3.0000 mL | INTRAVENOUS | Status: DC | PRN

## 2021-09-12 MED ORDER — SODIUM CHLORIDE 0.9 % IV SOLN: INTRAVENOUS | Status: DC

## 2021-09-12 MED ORDER — APIXABAN 5 MG PO TABS
5.0000 mg | ORAL_TABLET | Freq: Once | ORAL | Status: AC
  Administered 2021-09-12: 5 mg via ORAL
  Filled 2021-09-12 (×2): qty 1

## 2021-09-12 MED ORDER — ONDANSETRON HCL 4 MG/2ML IJ SOLN: 4.0000 mg | Freq: Four times a day (QID) | INTRAMUSCULAR | Status: DC | PRN

## 2021-09-12 MED ORDER — SODIUM CHLORIDE 0.9% FLUSH: 3.0000 mL | Freq: Two times a day (BID) | INTRAVENOUS | Status: DC

## 2021-09-12 MED ORDER — PROPOFOL 10 MG/ML IV BOLUS
INTRAVENOUS | Status: DC | PRN
  Administered 2021-09-12: 130 mg via INTRAVENOUS

## 2021-09-12 MED ORDER — LIDOCAINE 2% (20 MG/ML) 5 ML SYRINGE
INTRAMUSCULAR | Status: DC | PRN
  Administered 2021-09-12: 60 mg via INTRAVENOUS

## 2021-09-12 MED ORDER — ROCURONIUM BROMIDE 10 MG/ML (PF) SYRINGE
PREFILLED_SYRINGE | INTRAVENOUS | Status: DC | PRN
  Administered 2021-09-12: 80 mg via INTRAVENOUS

## 2021-09-12 MED ORDER — ISOPROTERENOL HCL 0.2 MG/ML IJ SOLN
INTRAVENOUS | Status: DC | PRN
  Administered 2021-09-12: 20 ug/min via INTRAVENOUS

## 2021-09-12 SURGICAL SUPPLY — 10 items
CATH EZ STEER NAV 8MM F-J CUR (ABLATOR) ×2 IMPLANT
CATH WEBSTER BI DIR CS D-F CRV (CATHETERS) ×2 IMPLANT
CLOSURE PERCLOSE PROSTYLE (VASCULAR PRODUCTS) ×4 IMPLANT
PACK EP LATEX FREE (CUSTOM PROCEDURE TRAY) ×2
PACK EP LF (CUSTOM PROCEDURE TRAY) ×1 IMPLANT
PAD DEFIB RADIO PHYSIO CONN (PAD) ×2 IMPLANT
PATCH CARTO3 (PAD) ×2 IMPLANT
SHEATH PINNACLE 7F 10CM (SHEATH) ×2 IMPLANT
SHEATH PINNACLE 8F 10CM (SHEATH) ×2 IMPLANT
SHEATH PROBE COVER 6X72 (BAG) ×2 IMPLANT

## 2021-09-12 NOTE — Anesthesia Procedure Notes (Signed)
Procedure Name: Intubation Date/Time: 09/12/2021 11:15 AM Performed by: Kyung Rudd, CRNA Pre-anesthesia Checklist: Patient identified, Emergency Drugs available, Suction available and Patient being monitored Patient Re-evaluated:Patient Re-evaluated prior to induction Oxygen Delivery Method: Circle system utilized Preoxygenation: Pre-oxygenation with 100% oxygen Induction Type: IV induction Ventilation: Mask ventilation without difficulty and Oral airway inserted - appropriate to patient size Laryngoscope Size: Glidescope and 4 (Utilized Glidescope Go.) Grade View: Grade II Tube type: Oral Tube size: 7.5 mm Number of attempts: 1 Airway Equipment and Method: Stylet Placement Confirmation: ETT inserted through vocal cords under direct vision and breath sounds checked- equal and bilateral Secured at: 21 cm Tube secured with: Tape Dental Injury: Teeth and Oropharynx as per pre-operative assessment

## 2021-09-12 NOTE — Anesthesia Postprocedure Evaluation (Signed)
Anesthesia Post Note  Patient: William Phillips  Procedure(s) Performed: A-FLUTTER ABLATION     Patient location during evaluation: PACU Anesthesia Type: General Level of consciousness: awake Pain management: pain level controlled Vital Signs Assessment: post-procedure vital signs reviewed and stable Respiratory status: spontaneous breathing Cardiovascular status: stable Postop Assessment: no apparent nausea or vomiting Anesthetic complications: no   No notable events documented.  Last Vitals:  Vitals:   09/12/21 1523 09/12/21 1555  BP: 121/65 125/68  Pulse: 82 80  Resp: (!) 23 (!) 26  Temp:    SpO2: 94% 95%    Last Pain:  Vitals:   09/12/21 1335  TempSrc: Oral  PainSc: 0-No pain                 Vietta Bonifield

## 2021-09-12 NOTE — Anesthesia Preprocedure Evaluation (Signed)
Anesthesia Evaluation  Patient identified by MRN, date of birth, ID band Patient awake    Reviewed: Allergy & Precautions, Patient's Chart, lab work & pertinent test results  Airway Mallampati: II  TM Distance: >3 FB     Dental   Pulmonary neg pulmonary ROS, former smoker,    breath sounds clear to auscultation       Cardiovascular hypertension,  Rhythm:Regular Rate:Normal     Neuro/Psych    GI/Hepatic Neg liver ROS, GERD  ,  Endo/Other    Renal/GU negative Renal ROS     Musculoskeletal  (+) Arthritis ,   Abdominal   Peds  Hematology   Anesthesia Other Findings   Reproductive/Obstetrics                             Anesthesia Physical Anesthesia Plan  ASA: 3  Anesthesia Plan: General   Post-op Pain Management:    Induction: Intravenous  PONV Risk Score and Plan: 2 and Ondansetron, Dexamethasone and Midazolam  Airway Management Planned: Oral ETT  Additional Equipment:   Intra-op Plan:   Post-operative Plan: Extubation in OR  Informed Consent: I have reviewed the patients History and Physical, chart, labs and discussed the procedure including the risks, benefits and alternatives for the proposed anesthesia with the patient or authorized representative who has indicated his/her understanding and acceptance.     Dental advisory given  Plan Discussed with: CRNA  Anesthesia Plan Comments:         Anesthesia Quick Evaluation

## 2021-09-12 NOTE — H&P (Signed)
PCP:  Mosie Lukes, MD      Cardiologist:  Dr Stanford Breed Primary Electrophysiologist: Thompson Grayer, MD        CC: atrial flutter   History of Present Illness: William Phillips is a 70 y.o. male who presents today for electrophysiology study and ablation for atrial flutter.    He underwent CTI ablation by me in 2016. He did very well post ablation. [place]. In this setting, he was observed to have atrial flutter.  This has since resolved, without recurrence.   Today, he denies symptoms of palpitations, chest pain, shortness of breath, orthopnea, PND, lower extremity edema, claudication, dizziness, presyncope, syncope, bleeding, or neurologic sequela. The patient is tolerating medications without difficulties and is otherwise without complaint today.          Past Medical History:  Diagnosis Date   Allergic rhinitis     Ankle fracture 1998   Atrial flutter ([place])      a. s/p CTI ablation by Dr Rayann Heman 11/2014   Back pain 08/05/2015   BPH (benign prostatic hyperplasia) 08/26/2015   Ganglion cyst of dorsum of right wrist 04/08/2017   GERD (gastroesophageal reflux disease)     Hammer toe of right foot 05/06/2013   History of carpal tunnel syndrome     Hyperlipidemia     Hypertension     Insect bite 04/23/2015   OA (osteoarthritis) 02/06/2013    knees   Pedal edema 04/08/2017   Preventative health care 04/27/2011    Had normal colonoscopy in 2011          Past Surgical History:  Procedure Laterality Date   ABLATION   11-24-14    CTI ablation by Dr Rayann Heman   ANKLE SURGERY       ATRIAL FLUTTER ABLATION N/A 11/24/2014    Procedure: ATRIAL FLUTTER ABLATION;  Surgeon: Thompson Grayer, MD;  Location: [facility] CATH LAB;  Service: Cardiovascular;  Laterality: N/A;   Carpel tunnel       COLONOSCOPY   08/04/2002    Normal Exam- Dr Fransico Meadow HERNIA REPAIR   2008   TONSILLECTOMY                  Current Outpatient Medications  Medication Sig  Dispense Refill   amLODipine (NORVASC) 10 MG tablet TAKE ONE-HALF TABLET BY  MOUTH TWICE DAILY 90 tablet 3   apixaban (ELIQUIS) 5 MG TABS tablet Take 1 tablet (5 mg total) by mouth 2 (two) times daily. 180 tablet 3   Clobetasol Propionate 0.05 % shampoo Apply topically 2 (two) times a week.       fexofenadine (ALLEGRA) 180 MG tablet Take 1 tablet (180 mg total) by mouth daily.       fluticasone (FLONASE) 50 MCG/ACT nasal spray Place 2 sprays into both nostrils daily as needed for allergies or rhinitis.   2   ketoconazole (NIZORAL) 2 % shampoo Apply topically 2 (two) times a week.       metoprolol succinate (TOPROL-XL) 100 MG 24 hr tablet TAKE 1 TABLET BY MOUTH  DAILY WITH OR IMMEDIATELY  FOLLOWING A MEAL 90 tablet 3   Multiple Vitamin (MULTIVITAMIN) tablet Take 1 tablet by mouth daily.       omeprazole (PRILOSEC) 20 MG capsule TAKE 1 CAPSULE BY MOUTH  DAILY 90 capsule 3   quinapril-hydrochlorothiazide (ACCURETIC) 20-12.5 MG tablet TAKE 1 TABLET BY MOUTH  DAILY 90 tablet 1   sildenafil (  REVATIO) 20 MG tablet Take 1-4 tablets (20-80 mg total) by mouth daily as needed. 90 tablet 1   simvastatin (ZOCOR) 20 MG tablet TAKE 1 TABLET BY MOUTH AT  BEDTIME 90 tablet 3   tamsulosin (FLOMAX) 0.4 MG CAPS capsule TAKE 1 CAPSULE BY MOUTH  DAILY 90 capsule 3    No current facility-administered medications for this visit.      Allergies:   Penicillins    Social History:  The patient  reports that he has quit smoking. He has never used smokeless tobacco. He reports current alcohol use. He reports that he does not use drugs.    Family History:  The patient's  family history includes Arthritis in his mother; COPD in his father; Coronary artery disease in his father; Dementia in his father; Diabetes in his maternal grandfather and sister; Diabetes type II in his maternal grandfather; Heart attack (age of onset: 23) in his father; Hyperlipidemia in his father and another family member; Hypertension in his father and  another family member; Stroke in his mother.      ROS:  Please see the history of present illness.   All other systems are personally reviewed and negative.      PHYSICAL EXAM: Vitals:   09/12/21 0834  BP: (!) 143/77  Pulse: 65  Resp: 15  Temp: 98.3 F (36.8 C)  SpO2: 97%   GEN: Well nourished, well developed, in no acute distress HEENT: normal Neck: no JVD, carotid bruits, or masses Cardiac: RRR; no murmurs, rubs, or gallops,no edema  Respiratory:  clear to auscultation bilaterally, normal work of breathing GI: soft, nontender, nondistended, + BS MS: no deformity or atrophy Skin: warm and dry  Neuro:  Strength and sensation are intact Psych: euthymic mood, full affect      Ekg from 04/02/21 is personally reviewed and shows 2:1 atrial flutter, likely typical        Other studies personally reviewed: Additional studies/ records that were reviewed today include: Dr Lonia Skinner notes, prior echo, prior ablation  Review of the above records today demonstrates: as above     ASSESSMENT AND PLAN:   1.  Recurrent atrial flutter (likely typical) S/p CTI ablation by me 11/24/2014.  Ekg from 6/22 reveals recurrent atrial flutter.  I suspect that this is also isthmus dependant. Therapeutic strategies for atrial flutter including medicine and ablation were discussed in detail with the patient today.   Risk, benefits, and alternatives to EP study and radiofrequency ablation were also discussed in detail today. These risks include but are not limited to stroke, bleeding, vascular damage, tamponade, perforation, damage to the heart and other structures, AV block requiring pacemaker, worsening renal function, and death. The patient understands these risk and wishes to proceed.    He reports compliance with eliquis without interruption.  Thompson Grayer MD, [place] 09/12/2021 10:35 AM

## 2021-09-12 NOTE — Discharge Instructions (Signed)

## 2021-09-12 NOTE — Progress Notes (Signed)
Pt ambulated without difficulty or bleeding.   Discharged home with his wife who will drive and stay with pt x 24 hrs. 

## 2021-09-12 NOTE — Transfer of Care (Signed)
Immediate Anesthesia Transfer of Care Note  Patient: William Phillips  Procedure(s) Performed: A-FLUTTER ABLATION  Patient Location: Cath Lab  Anesthesia Type:General  Level of Consciousness: awake, alert  and oriented  Airway & Oxygen Therapy: Patient Spontanous Breathing and Patient connected to nasal cannula oxygen  Post-op Assessment: Report given to RN, Post -op Vital signs reviewed and stable and Patient moving all extremities  Post vital signs: Reviewed and stable  Last Vitals:  Vitals Value Taken Time  BP 126/70 09/12/21 1248  Temp    Pulse 75 09/12/21 1251  Resp 23 09/12/21 1251  SpO2 96 % 09/12/21 1251  Vitals shown include unvalidated device data.  Last Pain:  Vitals:   09/12/21 1247  TempSrc:   PainSc: 0-No pain         Complications: No notable events documented.

## 2021-09-13 ENCOUNTER — Other Ambulatory Visit: Payer: Self-pay

## 2021-09-13 ENCOUNTER — Telehealth: Payer: Self-pay | Admitting: Cardiology

## 2021-09-13 DIAGNOSIS — I4892 Unspecified atrial flutter: Secondary | ICD-10-CM

## 2021-09-13 MED ORDER — APIXABAN 5 MG PO TABS: 5.0000 mg | ORAL_TABLET | Freq: Two times a day (BID) | ORAL | 5 refills | Status: DC

## 2021-09-13 NOTE — Telephone Encounter (Signed)
*  STAT* If patient is at the pharmacy, call can be transferred to refill team.   1. Which medications need to be refilled? (please list name of each medication and dose if known)  apixaban (ELIQUIS) 5 MG TABS tablet  2. Which pharmacy/location (including street and city if local pharmacy) is medication to be sent to? [facility] [id number] - [place], [place]  3. Do they need a 30 day or 90 day supply? 30 with refills  Patient was told to stay on this medication following his ablation. He ran out of his other rx

## 2021-09-13 NOTE — Telephone Encounter (Signed)
Prescription refill request for Eliquis received. Indication:Aflutter Last office visit:10/22 Scr:0.8 Age: 70 Weight:86.2 kg  Prescription refilled

## 2021-09-17 ENCOUNTER — Telehealth: Payer: Self-pay | Admitting: Internal Medicine

## 2021-09-17 NOTE — Telephone Encounter (Signed)
Patient arrived for Eliquis 5 mg samples. Total of 6 boxes was given to patient

## 2021-09-17 NOTE — Telephone Encounter (Signed)
Spoke to the patient just now and let him know that these are ready for him to pick up. He states that he will do so soon.    Encouraged patient to call back with any questions or concerns.

## 2021-09-17 NOTE — Telephone Encounter (Signed)
Patient calling the office for samples of medication:   1.  What medication and dosage are you requesting samples for? apixaban (ELIQUIS) 5 MG TABS tablet  2.  Are you currently out of this medication? Yes    

## 2021-09-19 ENCOUNTER — Other Ambulatory Visit: Payer: Self-pay

## 2021-09-19 NOTE — Telephone Encounter (Signed)
error 

## 2021-09-20 ENCOUNTER — Other Ambulatory Visit: Payer: Self-pay

## 2021-09-20 ENCOUNTER — Telehealth: Payer: Self-pay

## 2021-09-20 NOTE — Telephone Encounter (Signed)
Called [facility] and did a verbal for lisinopril/hctz to pt since quinapril/hctz is no longer in stock through [facility].

## 2021-09-25 ENCOUNTER — Ambulatory Visit: Payer: Medicare Other | Admitting: Cardiology

## 2021-10-01 ENCOUNTER — Ambulatory Visit (INDEPENDENT_AMBULATORY_CARE_PROVIDER_SITE_OTHER): Payer: Medicare Other

## 2021-10-01 VITALS — Ht 72.0 in | Wt 190.0 lb

## 2021-10-01 DIAGNOSIS — Z0000 Encounter for general adult medical examination without abnormal findings: Secondary | ICD-10-CM | POA: Diagnosis not present

## 2021-10-01 DIAGNOSIS — Z1211 Encounter for screening for malignant neoplasm of colon: Secondary | ICD-10-CM | POA: Diagnosis not present

## 2021-10-01 NOTE — Progress Notes (Signed)
Subjective:   William Phillips is a 70 y.o. male who presents for Medicare Annual Subsequent Wellness Visit.  I connected with William Phillips today by telephone and verified that I am speaking with the correct person using two identifiers. Location patient: home Location provider: work Persons participating in the virtual visit: patient, Marine scientist.    I discussed the limitations, risks, security and privacy concerns of performing an evaluation and management service by telephone and the availability of in person appointments. I also discussed with the patient that there may be a patient responsible charge related to this service. The patient expressed understanding and verbally consented to this telephonic visit.    Interactive audio and video telecommunications were attempted between this provider and patient, however failed, due to patient having technical difficulties OR patient did not have access to video capability.  We continued and completed visit with audio only.  Some vital signs may be absent or patient reported.   Time Spent with patient on telephone encounter: 30 minutes   Review of Systems     Cardiac Risk Factors include: advanced age (>14men, >5 women);hypertension;dyslipidemia;male gender     Objective:    Today's Vitals   10/01/21 0820  Weight: 190 lb (86.2 kg)  Height: 6' (1.829 m)   Body mass index is 25.77 kg/m.  Advanced Directives 10/01/2021 09/12/2021 11/29/2019 11/24/2014 09/25/2014  Does Patient Have a Medical Advance Directive? - Yes Yes Yes No  Type of Advance Directive Living will [place];Living will [place];Living will [place];Living will -  Does patient want to make changes to medical advance directive? - - No - Patient declined No - Patient declined -  Copy of [place] in Chart? - No - copy requested No - copy requested - -  Would patient like information on creating a  medical advance directive? - - - - No - patient declined information    Current Medications (verified) Outpatient Encounter Medications as of 10/01/2021  Medication Sig   amLODipine (NORVASC) 10 MG tablet TAKE ONE-HALF TABLET BY  MOUTH TWICE DAILY   apixaban (ELIQUIS) 5 MG TABS tablet Take 1 tablet (5 mg total) by mouth 2 (two) times daily.   Ascorbic Acid (VITA-C PO) Take 1 tablet by mouth daily.   Brimonidine Tartrate (LUMIFY) 0.025 % SOLN Place 1 drop into both eyes daily.   Carboxymethylcellulose Sodium (LUBRICANT EYE DROPS OP) Place 1 drop into both eyes 2 (two) times daily as needed (Dry eye).   Cholecalciferol (VITAMIN D3 PO) Take 1 tablet by mouth daily.   Clobetasol Propionate 0.05 % shampoo Apply 1 application topically daily as needed (irritation).   fexofenadine (ALLEGRA) 180 MG tablet Take 1 tablet (180 mg total) by mouth daily. (Patient taking differently: Take 180 mg by mouth daily as needed for allergies.)   fluticasone (FLONASE) 50 MCG/ACT nasal spray Place 2 sprays into both nostrils daily as needed for allergies or rhinitis.   ketoconazole (NIZORAL) 2 % shampoo Apply 1 application topically daily as needed for irritation.   lisinopril-hydrochlorothiazide (ZESTORETIC) 20-12.5 MG tablet Take 1 tablet by mouth daily.   metoprolol succinate (TOPROL-XL) 100 MG 24 hr tablet TAKE 1 TABLET BY MOUTH  DAILY WITH OR IMMEDIATELY  FOLLOWING A MEAL   Multiple Vitamin (MULTIVITAMIN) tablet Take 1 tablet by mouth daily.   omeprazole (PRILOSEC) 20 MG capsule TAKE 1 CAPSULE BY MOUTH  DAILY   sildenafil (REVATIO) 20 MG tablet Take 1-4 tablets (20-80 mg  total) by mouth daily as needed.   simvastatin (ZOCOR) 20 MG tablet TAKE 1 TABLET BY MOUTH AT  BEDTIME   tamsulosin (FLOMAX) 0.4 MG CAPS capsule TAKE 1 CAPSULE BY MOUTH  DAILY   No facility-administered encounter medications on file as of 10/01/2021.    Allergies (verified) Penicillins   History: Past Medical History:  Diagnosis Date    Allergic rhinitis    Ankle fracture 1998   Atrial flutter ([place])    a. s/p CTI ablation by Dr Rayann Heman 11/2014   Back pain 08/05/2015   BPH (benign prostatic hyperplasia) 08/26/2015   Ganglion cyst of dorsum of right wrist 04/08/2017   GERD (gastroesophageal reflux disease)    Hammer toe of right foot 05/06/2013   History of carpal tunnel syndrome    Hyperlipidemia    Hypertension    Insect bite 04/23/2015   OA (osteoarthritis) 02/06/2013   knees   Pedal edema 04/08/2017   Preventative health care 04/27/2011   Had normal colonoscopy in 2011    Past Surgical History:  Procedure Laterality Date   A-FLUTTER ABLATION N/A 09/12/2021   Procedure: A-FLUTTER ABLATION;  Surgeon: Thompson Grayer, MD;  Location: [facility] LAB;  Service: Cardiovascular;  Laterality: N/A;   ABLATION  11-24-14   CTI ablation by Dr Rayann Heman   ANKLE SURGERY     ATRIAL FLUTTER ABLATION N/A 11/24/2014   Procedure: ATRIAL FLUTTER ABLATION;  Surgeon: Thompson Grayer, MD;  [facility] CATH LAB;  Service: Cardiovascular;  Laterality: N/A;   Carpel tunnel     COLONOSCOPY  08/04/2002   Normal Exam- Dr Fransico Meadow HERNIA REPAIR  2008   TONSILLECTOMY     Family History  Problem Relation Age of Onset   Coronary artery disease Father        aortic valve disease   Heart attack Father 39   COPD Father    Hypertension Father    Hyperlipidemia Father    Dementia Father    Diabetes type II Maternal Grandfather    Diabetes Maternal Grandfather    Arthritis Mother    Stroke Mother    Diabetes Sister    Hyperlipidemia Other    Hypertension Other    Social History   Socioeconomic History   Marital status: Married    Spouse name: Not on file   Number of children: 1   Years of education: Not on file   Highest education level: Not on file  Occupational History    Comment: Sales  Tobacco Use   Smoking status: Former   Smokeless tobacco: Never   Tobacco comments:    5-10 pack year history  Vaping Use   Vaping Use:  Never used  Substance and Sexual Activity   Alcohol use: Yes    Comment: 2-3 glasses of wine per day   Drug use: No   Sexual activity: Yes  Other Topics Concern   Not on file  Social History Narrative   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chemicals, [employer], 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[employer])   Married- 78 years   59 daughter -Paramedic in   ([place]   Previous smoker - 5-10 pack yrs     Alcohol use-yes (2-3 glasses of wine)     Social Determinants of Health   Financial Resource Strain: Low Risk    Difficulty of Paying Living Expenses: Not hard at all  Food Insecurity: No Food Insecurity   Worried About Charity fundraiser in the Last Year: Never true   Ran  Out of Food in the Last Year: Never true  Transportation Needs: No Transportation Needs   Lack of Transportation (Medical): No   Lack of Transportation (Non-Medical): No  Physical Activity: Not on file  Stress: No Stress Concern Present   Feeling of Stress : Not at all  Social Connections: Socially Integrated   Frequency of Communication with Friends and Family: More than three times a week   Frequency of Social Gatherings with Friends and Family: More than three times a week   Attends Religious Services: More than 4 times per year   Active Member of [employer] or Organizations: Yes   Attends Music therapist: More than 4 times per year   Marital Status: Married    Tobacco Counseling Counseling given: Not Answered Tobacco comments: 5-10 pack year history   Clinical Intake:  Pre-visit preparation completed: Yes  Pain : No/denies pain     BMI - recorded: 25.77 Nutritional Status: BMI 25 -29 Overweight Nutritional Risks: None Diabetes: No  How often do you need to have someone help you when you read instructions, pamphlets, or other written materials from your doctor or pharmacy?: 1 - Never  Diabetic?No  Interpreter Needed?: No  Information entered by :: Caroleen Hamman LPN   Activities of Daily Living In  your present state of health, do you have any difficulty performing the following activities: 10/01/2021 04/02/2021  Hearing? N N  Vision? N N  Difficulty concentrating or making decisions? N N  Walking or climbing stairs? N N  Dressing or bathing? N N  Doing errands, shopping? N N  Preparing Food and eating ? N -  Using the Toilet? N -  In the past six months, have you accidently leaked urine? N -  Do you have problems with loss of bowel control? N -  Managing your Medications? N -  Managing your Finances? N -  Housekeeping or managing your Housekeeping? N -  Some recent data might be hidden    Patient Care Team: Mosie Lukes, MD as PCP - General (Family Medicine)  Indicate any recent Medical Services you may have received from other than [facility] providers in the past year (date may be approximate).     Assessment:   This is a routine wellness examination for William Phillips.  Hearing/Vision screen Hearing Screening - Comments:: No issues Vision Screening - Comments:: Last eye exam-Fall of 2021-has an upcoming appt-[facility]  Dietary issues and exercise activities discussed: Current Exercise Habits: The patient does not participate in regular exercise at present, Exercise limited by: None identified   Goals Addressed             This Visit's Progress    Maintain healthy active lifestyle.   On track      Depression Screen PHQ 2/9 Scores 10/01/2021 04/29/2021 04/02/2021 11/29/2019 11/05/2017  PHQ - 2 Score 0 0 0 0 0  PHQ- 9 Score - 0 - - -    Fall Risk Fall Risk  10/01/2021 04/29/2021 04/02/2021 11/29/2019 11/05/2017  Falls in the past year? - 0 0 0 No  Number falls in past yr: 0 0 0 0 -  Injury with Fall? 0 0 0 0 -  Risk for fall due to : - No Fall Risks - - -  Follow up Falls prevention discussed Falls evaluation completed Falls evaluation completed Education provided;Falls prevention discussed -    FALL RISK PREVENTION PERTAINING TO THE HOME:  Any stairs in  or around the home? Yes  If so, are there any without handrails? Yes  Home free of loose throw rugs in walkways, pet beds, electrical cords, etc? Yes  Adequate lighting in your home to reduce risk of falls? Yes   ASSISTIVE DEVICES UTILIZED TO PREVENT FALLS:  Life alert? No  Use of a cane, walker or w/c? No  Grab bars in the bathroom? No  Shower chair or bench in shower? No  Elevated toilet seat or a handicapped toilet? No   TIMED UP AND GO:  Was the test performed? No . Phone visit   Cognitive Function:Normal cognitive status assessed by this Nurse Health Advisor. No abnormalities found.          Immunizations Immunization History  Administered Date(s) Administered   Fluad Quad(high Dose 65+) 06/29/2019   Influenza Whole 07/25/2009   Influenza, High Dose Seasonal PF 07/29/2017   Influenza,inj,Quad PF,6+ Mos 07/24/2015, 07/18/2016, 06/29/2019, 10/11/2020   Influenza-Unspecified 08/13/2018   Moderna SARS-COV2 Booster Vaccination 10/23/2020   Moderna Sars-Covid-2 Vaccination 01/01/2020, 01/29/2020   Pneumococcal Conjugate-13 11/02/2013   Pneumococcal Polysaccharide-23 11/05/2017   Td 01/07/2005   Tdap 08/24/2015   Zoster Recombinat (Shingrix) 10/11/2020, 12/27/2020   Zoster, Live 02/04/2013    TDAP status: Up to date  Flu Vaccine status: Due, Education has been provided regarding the importance of this vaccine. Advised may receive this vaccine at local pharmacy or Health Dept. Aware to provide a copy of the vaccination record if obtained from local pharmacy or Health Dept. Verbalized acceptance and understanding.  Pneumococcal vaccine status: Up to date  Covid-19 vaccine status: Information provided on how to obtain vaccines.   Qualifies for Shingles Vaccine? No   Zostavax completed Yes   Shingrix Completed?: Yes  Screening Tests Health Maintenance  Topic Date Due   COLONOSCOPY (Pts 45-82yrs Insurance coverage will need to be confirmed)  08/22/2020   COVID-19  Vaccine (3 - Moderna risk series) 11/20/2020   INFLUENZA VACCINE  05/13/2021   TETANUS/TDAP  08/23/2025   Pneumonia Vaccine 58+ Years old  Completed   Hepatitis C Screening  Completed   Zoster Vaccines- Shingrix  Completed   HPV VACCINES  Aged Out    Health Maintenance  Health Maintenance Due  Topic Date Due   COLONOSCOPY (Pts 45-40yrs Insurance coverage will need to be confirmed)  08/22/2020   COVID-19 Vaccine (3 - Moderna risk series) 11/20/2020   INFLUENZA VACCINE  05/13/2021   Colorectal cancer screening; Cologuard ordered today  Lung Cancer Screening: (Low Dose CT Chest recommended if Age 6-80 years, 30 pack-year currently smoking OR have quit w/in 15years.) does not qualify.    Additional Screening:  Hepatitis C Screening: Completed 05/26/2016  Vision Screening: Recommended annual ophthalmology exams for early detection of glaucoma and other disorders of the eye. Is the patient up to date with their annual eye exam?  No  Who is the provider or what is the name of the office in which the patient attends annual eye exams? [facility]-patient has an upcoming appt   Dental Screening: Recommended annual dental exams for proper oral hygiene  Community Resource Referral / Chronic Care Management: CRR required this visit?  No   CCM required this visit?  No      Plan:     I have personally reviewed and noted the following in the patients chart:   Medical and social history Use of alcohol, tobacco or illicit drugs  Current medications and supplements including opioid prescriptions. Patient is not currently taking opioid prescriptions. Functional ability and  status Nutritional status Physical activity Advanced directives List of other physicians Hospitalizations, surgeries, and ER visits in previous 12 months Vitals Screenings to include cognitive, depression, and falls Referrals and appointments  In addition, I have reviewed and discussed with  patient certain preventive protocols, quality metrics, and best practice recommendations. A written personalized care plan for preventive services as well as general preventive health recommendations were provided to patient.   Due to this being a telephonic visit, the after visit summary with patients personalized plan was offered to patient via mail or my-chart. Patient would like to access on my-chart.   Marta Antu, LPN   [id number]  Nurse health Advisor  Nurse Notes: None

## 2021-10-01 NOTE — Patient Instructions (Signed)
[name] , Thank you for taking time to complete your Medicare Wellness Visit. I appreciate your ongoing commitment to your health goals. Please review the following plan we discussed and let me know if I can assist you in the future.   Screening recommendations/referrals: Colonoscopy: Cologuard ordered today. Recommended yearly ophthalmology/optometry visit for glaucoma screening and checkup Recommended yearly dental visit for hygiene and checkup  Vaccinations: Influenza vaccine: Due-May obtain vaccine at our office or your local pharmacy. Pneumococcal vaccine: Up to date Tdap vaccine: Up to date Shingles vaccine: Completed vaccines   Covid-19: Booster available at pharmacy  Advanced directives: Please bring a copy of Living Will and/or Healthcare Power of Attorney for your chart.   Conditions/risks identified: See problem list  Next appointment: Follow up in one year for your annual wellness visit. 10/09/2022 @ 8:20 (phone visit)  Preventive Care 69 Years and Older, Male Preventive care refers to lifestyle choices and visits with your health care provider that can promote health and wellness. What does preventive care include? A yearly physical exam. This is also called an annual well check. Dental exams once or twice a year. Routine eye exams. Ask your health care provider how often you should have your eyes checked. Personal lifestyle choices, including: Daily care of your teeth and gums. Regular physical activity. Eating a healthy diet. Avoiding tobacco and drug use. Limiting alcohol use. Practicing safe sex. Taking low doses of aspirin every day. Taking vitamin and mineral supplements as recommended by your health care provider. What happens during an annual well check? The services and screenings done by your health care provider during your annual well check will depend on your age, overall health, lifestyle risk factors, and family history of disease. Counseling   Your health care provider may ask you questions about your: Alcohol use. Tobacco use. Drug use. Emotional well-being. Home and relationship well-being. Sexual activity. Eating habits. History of falls. Memory and ability to understand (cognition). Work and work Statistician. Screening  You may have the following tests or measurements: Height, weight, and BMI. Blood pressure. Lipid and cholesterol levels. These may be checked every 5 years, or more frequently if you are over 43 years old. Skin check. Lung cancer screening. You may have this screening every year starting at age 23 if you have a 30-pack-year history of smoking and currently smoke or have quit within the past 15 years. Fecal occult blood test (FOBT) of the stool. You may have this test every year starting at age 30. Flexible sigmoidoscopy or colonoscopy. You may have a sigmoidoscopy every 5 years or a colonoscopy every 10 years starting at age 69. Prostate cancer screening. Recommendations will vary depending on your family history and other risks. Hepatitis C blood test. Hepatitis B blood test. Sexually transmitted disease (STD) testing. Diabetes screening. This is done by checking your blood sugar (glucose) after you have not eaten for a while (fasting). You may have this done every 1-3 years. Abdominal aortic aneurysm (AAA) screening. You may need this if you are a current or former smoker. Osteoporosis. You may be screened starting at age 52 if you are at high risk. Talk with your health care provider about your test results, treatment options, and if necessary, the need for more tests. Vaccines  Your health care provider may recommend certain vaccines, such as: Influenza vaccine. This is recommended every year. Tetanus, diphtheria, and acellular pertussis (Tdap, Td) vaccine. You may need a Td booster every 10 years. Zoster vaccine. You may need  this after age 7. Pneumococcal 13-valent conjugate (PCV13) vaccine.  One dose is recommended after age 80. Pneumococcal polysaccharide (PPSV23) vaccine. One dose is recommended after age 31. Talk to your health care provider about which screenings and vaccines you need and how often you need them. This information is not intended to replace advice given to you by your health care provider. Make sure you discuss any questions you have with your health care provider. Document Released: 10/26/2015 Document Revised: 06/18/2016 Document Reviewed: 07/31/2015 Elsevier Interactive Patient Education  2017 [place] Prevention in the Home Falls can cause injuries. They can happen to people of all ages. There are many things you can do to make your home safe and to help prevent falls. What can I do on the outside of my home? Regularly fix the edges of walkways and driveways and fix any cracks. Remove anything that might make you trip as you walk through a door, such as a raised step or threshold. Trim any bushes or trees on the path to your home. Use bright outdoor lighting. Clear any walking paths of anything that might make someone trip, such as rocks or tools. Regularly check to see if handrails are loose or broken. Make sure that both sides of any steps have handrails. Any raised decks and porches should have guardrails on the edges. Have any leaves, snow, or ice cleared regularly. Use sand or salt on walking paths during winter. Clean up any spills in your garage right away. This includes oil or grease spills. What can I do in the bathroom? Use night lights. Install grab bars by the toilet and in the tub and shower. Do not use towel bars as grab bars. Use non-skid mats or decals in the tub or shower. If you need to sit down in the shower, use a plastic, non-slip stool. Keep the floor dry. Clean up any water that spills on the floor as soon as it happens. Remove soap buildup in the tub or shower regularly. Attach bath mats securely with double-sided  non-slip rug tape. Do not have throw rugs and other things on the floor that can make you trip. What can I do in the bedroom? Use night lights. Make sure that you have a light by your bed that is easy to reach. Do not use any sheets or blankets that are too big for your bed. They should not hang down onto the floor. Have a firm chair that has side arms. You can use this for support while you get dressed. Do not have throw rugs and other things on the floor that can make you trip. What can I do in the kitchen? Clean up any spills right away. Avoid walking on wet floors. Keep items that you use a lot in easy-to-reach places. If you need to reach something above you, use a strong step stool that has a grab bar. Keep electrical cords out of the way. Do not use floor polish or wax that makes floors slippery. If you must use wax, use non-skid floor wax. Do not have throw rugs and other things on the floor that can make you trip. What can I do with my stairs? Do not leave any items on the stairs. Make sure that there are handrails on both sides of the stairs and use them. Fix handrails that are broken or loose. Make sure that handrails are as long as the stairways. Check any carpeting to make sure that it is firmly attached to  the stairs. Fix any carpet that is loose or worn. Avoid having throw rugs at the top or bottom of the stairs. If you do have throw rugs, attach them to the floor with carpet tape. Make sure that you have a light switch at the top of the stairs and the bottom of the stairs. If you do not have them, ask someone to add them for you. What else can I do to help prevent falls? Wear shoes that: Do not have high heels. Have rubber bottoms. Are comfortable and fit you well. Are closed at the toe. Do not wear sandals. If you use a stepladder: Make sure that it is fully opened. Do not climb a closed stepladder. Make sure that both sides of the stepladder are locked into place. Ask  someone to hold it for you, if possible. Clearly mark and make sure that you can see: Any grab bars or handrails. First and last steps. Where the edge of each step is. Use tools that help you move around (mobility aids) if they are needed. These include: Canes. Walkers. Scooters. Crutches. Turn on the lights when you go into a dark area. Replace any light bulbs as soon as they burn out. Set up your furniture so you have a clear path. Avoid moving your furniture around. If any of your floors are uneven, fix them. If there are any pets around you, be aware of where they are. Review your medicines with your doctor. Some medicines can make you feel dizzy. This can increase your chance of falling. Ask your doctor what other things that you can do to help prevent falls. This information is not intended to replace advice given to you by your health care provider. Make sure you discuss any questions you have with your health care provider. Document Released: 07/26/2009 Document Revised: 03/06/2016 Document Reviewed: 11/03/2014 Elsevier Interactive Patient Education  2017 [employer].

## 2021-10-15 ENCOUNTER — Ambulatory Visit: Payer: Medicare Other | Admitting: Student

## 2021-10-16 DIAGNOSIS — Z1211 Encounter for screening for malignant neoplasm of colon: Secondary | ICD-10-CM | POA: Diagnosis not present

## 2021-10-17 ENCOUNTER — Ambulatory Visit: Payer: Medicare Other | Admitting: Internal Medicine

## 2021-10-17 ENCOUNTER — Encounter: Payer: Self-pay | Admitting: Internal Medicine

## 2021-10-17 ENCOUNTER — Other Ambulatory Visit: Payer: Self-pay

## 2021-10-17 VITALS — BP 100/64 | HR 69 | Ht 72.0 in | Wt 213.0 lb

## 2021-10-17 DIAGNOSIS — I10 Essential (primary) hypertension: Secondary | ICD-10-CM | POA: Diagnosis not present

## 2021-10-17 DIAGNOSIS — I4892 Unspecified atrial flutter: Secondary | ICD-10-CM

## 2021-10-17 MED ORDER — METOPROLOL SUCCINATE ER 50 MG PO TB24: 50.0000 mg | ORAL_TABLET | Freq: Every day | ORAL | 3 refills | Status: DC

## 2021-10-17 NOTE — Patient Instructions (Addendum)
Medication Instructions:  Stop Eliquis Reduce Metoprolol Succinate to 50 mg daily Your physician recommends that you continue on your current medications as directed. Please refer to the Current Medication list given to you today. *If you need a refill on your cardiac medications before your next appointment, please call your pharmacy*  Lab Work: None. If you have labs (blood work) drawn today and your tests are completely normal, you will receive your results only by: [place] (if you have MyChart) OR A paper copy in the mail If you have any lab test that is abnormal or we need to change your treatment, we will call you to review the results.  Testing/Procedures: None.  Follow-Up: At [facility], you and your health needs are our priority.  As part of our continuing mission to provide you with exceptional heart care, we have created designated Provider Care Teams.  These Care Teams include your primary Cardiologist (physician) and Advanced Practice Providers (APPs -  Physician Assistants and Nurse Practitioners) who all work together to provide you with the care you need, when you need it.  Your physician wants you to follow-up in: As needed with Thompson Grayer, MD   We recommend signing up for the patient portal called "MyChart".  Sign up information is provided on this After Visit Summary.  MyChart is used to connect with patients for Virtual Visits (Telemedicine).  Patients are able to view lab/test results, encounter notes, upcoming appointments, etc.  Non-urgent messages can be sent to your provider as well.   To learn more about what you can do with MyChart, go to [email or web address].    Any Other Special Instructions Will Be Listed Below (If Applicable).

## 2021-10-17 NOTE — Progress Notes (Signed)
PCP: Mosie Lukes, MD Primary Cardiologist: Dr Stanford Breed Primary EP: Dr Rayann Heman  William Phillips is a 71 y.o. male who presents today for routine electrophysiology followup.  Since his ablation, the patient reports doing very well.  Denies procedure related complications and is pleased with results of the procedure. Today, he denies symptoms of palpitations, chest pain, shortness of breath,  lower extremity edema, dizziness, presyncope, or syncope.  The patient is otherwise without complaint today.   Past Medical History:  Diagnosis Date   Allergic rhinitis    Ankle fracture 1998   Atrial flutter ([place])    a. s/p CTI ablation by Dr Rayann Heman 11/2014   Back pain 08/05/2015   BPH (benign prostatic hyperplasia) 08/26/2015   Ganglion cyst of dorsum of right wrist 04/08/2017   GERD (gastroesophageal reflux disease)    Hammer toe of right foot 05/06/2013   History of carpal tunnel syndrome    Hyperlipidemia    Hypertension    Insect bite 04/23/2015   OA (osteoarthritis) 02/06/2013   knees   Pedal edema 04/08/2017   Preventative health care 04/27/2011   Had normal colonoscopy in 2011    Past Surgical History:  Procedure Laterality Date   A-FLUTTER ABLATION N/A 09/12/2021   Procedure: A-FLUTTER ABLATION;  Surgeon: Thompson Grayer, MD;  Location: [facility];  Service: Cardiovascular;  Laterality: N/A;   ABLATION  11-24-14   CTI ablation by Dr Rayann Heman   ANKLE SURGERY     ATRIAL FLUTTER ABLATION N/A 11/24/2014   Procedure: ATRIAL FLUTTER ABLATION;  Surgeon: Thompson Grayer, MD;  [facility] C[facility];  Service: Cardiovascular;  Laterality: N/A;   Carpel tunnel     COLONOSCOPY  08/04/2002   Normal Exam- Dr Fransico Meadow HERNIA REPAIR  2008   TONSILLECTOMY      ROS- all systems are reviewed and negatives except as per HPI above  Current Outpatient Medications  Medication Sig Dispense Refill   amLODipine (NORVASC) 10 MG tablet TAKE ONE-HALF TABLET BY  MOUTH TWICE DAILY 90 tablet 3    apixaban (ELIQUIS) 5 MG TABS tablet Take 1 tablet (5 mg total) by mouth 2 (two) times daily. 60 tablet 5   Ascorbic Acid (VITA-C PO) Take 1 tablet by mouth daily.     Brimonidine Tartrate (LUMIFY) 0.025 % SOLN Place 1 drop into both eyes daily.     Carboxymethylcellulose Sodium (LUBRICANT EYE DROPS OP) Place 1 drop into both eyes 2 (two) times daily as needed (Dry eye).     Cholecalciferol (VITAMIN D3 PO) Take 1 tablet by mouth daily.     Clobetasol Propionate 0.05 % shampoo Apply 1 application topically daily as needed (irritation).     fexofenadine (ALLEGRA) 180 MG tablet Take 1 tablet (180 mg total) by mouth daily. (Patient taking differently: Take 180 mg by mouth daily as needed for allergies.)     fluticasone (FLONASE) 50 MCG/ACT nasal spray Place 2 sprays into both nostrils daily as needed for allergies or rhinitis.  2   ketoconazole (NIZORAL) 2 % shampoo Apply 1 application topically daily as needed for irritation.     lisinopril-hydrochlorothiazide (ZESTORETIC) 20-12.5 MG tablet Take 1 tablet by mouth daily.     metoprolol succinate (TOPROL-XL) 100 MG 24 hr tablet TAKE 1 TABLET BY MOUTH  DAILY WITH OR IMMEDIATELY  FOLLOWING A MEAL 90 tablet 3   Multiple Vitamin (MULTIVITAMIN) tablet Take 1 tablet by mouth daily.     omeprazole (PRILOSEC) 20 MG capsule TAKE  1 CAPSULE BY MOUTH  DAILY 90 capsule 3   sildenafil (REVATIO) 20 MG tablet Take 1-4 tablets (20-80 mg total) by mouth daily as needed. 90 tablet 1   simvastatin (ZOCOR) 20 MG tablet TAKE 1 TABLET BY MOUTH AT  BEDTIME 90 tablet 3   tamsulosin (FLOMAX) 0.4 MG CAPS capsule TAKE 1 CAPSULE BY MOUTH  DAILY 90 capsule 3   No current facility-administered medications for this visit.    Physical Exam: Vitals:   10/17/21 1205  BP: 100/64  Pulse: 69  SpO2: 96%  Weight: 213 lb (96.6 kg)  Height: 6' (1.829 m)    GEN- The patient is well appearing, alert and oriented x 3 today.   Head- normocephalic, atraumatic Eyes-  Sclera clear,  conjunctiva pink Ears- hearing intact Oropharynx- clear Lungs- Clear to ausculation bilaterally, normal work of breathing Heart- Regular rate and rhythm, no murmurs, rubs or gallops, PMI not laterally displaced GI- soft, NT, ND, + BS Extremities- no clubbing, cyanosis, or edema  Wt Readings from Last 3 Encounters:  10/17/21 213 lb (96.6 kg)  10/01/21 190 lb (86.2 kg)  09/12/21 190 lb (86.2 kg)    EKG tracing ordered today is personally reviewed and shows sinus  Assessment and Plan:  Recurrent typical atrial flutter Resolved post ablation Stop eliquis today Reduce toprol to 50mg  daily  2. HTN Reduce toprol as above  3. HL Continue zocor 20mg  daily  Follow-up with Dr Stanford Breed as scheduled Return to see me as needed  Thompson Grayer MD, [facility] 10/17/2021 12:20 PM

## 2021-10-22 DIAGNOSIS — H43813 Vitreous degeneration, bilateral: Secondary | ICD-10-CM | POA: Diagnosis not present

## 2021-10-22 DIAGNOSIS — H25013 Cortical age-related cataract, bilateral: Secondary | ICD-10-CM | POA: Diagnosis not present

## 2021-10-22 DIAGNOSIS — H52203 Unspecified astigmatism, bilateral: Secondary | ICD-10-CM | POA: Diagnosis not present

## 2021-10-22 DIAGNOSIS — D3131 Benign neoplasm of right choroid: Secondary | ICD-10-CM | POA: Diagnosis not present

## 2021-10-23 LAB — COLOGUARD: COLOGUARD: NEGATIVE

## 2021-11-11 ENCOUNTER — Encounter: Payer: Medicare Other | Admitting: Family Medicine

## 2021-11-14 ENCOUNTER — Other Ambulatory Visit: Payer: Self-pay | Admitting: Family Medicine

## 2022-01-15 NOTE — Progress Notes (Signed)
? ? ? ? ?HPI: Atrial flutter. Abdominal ultrasound April 2019 showed no aneurysm.  Previously diagnosed with atrial flutter in December 2015.  He subsequently had atrial flutter ablation February 2016.  Patient previously developed COVID and developed recurrent atrial flutter.  Echocardiogram repeated September 2022 and showed normal LV function, grade 1 diastolic dysfunction.  He had atrial flutter ablation December 2022.  Since last seen the patient denies any dyspnea on exertion, orthopnea, PND, pedal edema, palpitations, syncope or chest pain. ? ? ?Current Outpatient Medications  ?Medication Sig Dispense Refill  ? amLODipine (NORVASC) 10 MG tablet TAKE ONE-HALF TABLET BY  MOUTH TWICE DAILY 90 tablet 3  ? Ascorbic Acid (VITA-C PO) Take 1 tablet by mouth daily.    ? Brimonidine Tartrate (LUMIFY) 0.025 % SOLN Place 1 drop into both eyes daily.    ? Carboxymethylcellulose Sodium (LUBRICANT EYE DROPS OP) Place 1 drop into both eyes 2 (two) times daily as needed (Dry eye).    ? Cholecalciferol (VITAMIN D3 PO) Take 1 tablet by mouth daily.    ? Clobetasol Propionate 0.05 % shampoo Apply 1 application topically daily as needed (irritation).    ? fexofenadine (ALLEGRA) 180 MG tablet Take 1 tablet (180 mg total) by mouth daily. (Patient taking differently: Take 180 mg by mouth daily as needed for allergies.)    ? fluticasone (FLONASE) 50 MCG/ACT nasal spray Place 2 sprays into both nostrils daily as needed for allergies or rhinitis.  2  ? ketoconazole (NIZORAL) 2 % shampoo Apply 1 application topically daily as needed for irritation.    ? lisinopril-hydrochlorothiazide (ZESTORETIC) 20-12.5 MG tablet Take 1 tablet by mouth daily.    ? metoprolol succinate (TOPROL-XL) 50 MG 24 hr tablet Take 1 tablet (50 mg total) by mouth daily. Take with or immediately following a meal. 90 tablet 3  ? Multiple Vitamin (MULTIVITAMIN) tablet Take 1 tablet by mouth daily.    ? omeprazole (PRILOSEC) 20 MG capsule TAKE 1 CAPSULE BY MOUTH   DAILY 90 capsule 3  ? sildenafil (REVATIO) 20 MG tablet Take 1-4 tablets (20-80 mg total) by mouth daily as needed. 90 tablet 1  ? simvastatin (ZOCOR) 20 MG tablet TAKE 1 TABLET BY MOUTH AT  BEDTIME 90 tablet 3  ? tamsulosin (FLOMAX) 0.4 MG CAPS capsule TAKE 1 CAPSULE BY MOUTH  DAILY 90 capsule 3  ? ?No current facility-administered medications for this visit.  ? ? ? ?Past Medical History:  ?Diagnosis Date  ? Allergic rhinitis   ? Ankle fracture 1998  ? Atrial flutter ([place])   ? a. s/p CTI ablation by Dr Rayann Heman 11/2014  ? Back pain 08/05/2015  ? BPH (benign prostatic hyperplasia) 08/26/2015  ? Ganglion cyst of dorsum of right wrist 04/08/2017  ? GERD (gastroesophageal reflux disease)   ? Hammer toe of right foot 05/06/2013  ? History of carpal tunnel syndrome   ? Hyperlipidemia   ? Hypertension   ? Insect bite 04/23/2015  ? OA (osteoarthritis) 02/06/2013  ? knees  ? Pedal edema 04/08/2017  ? Preventative health care 04/27/2011  ? Had normal colonoscopy in 2011   ? ? ?Past Surgical History:  ?Procedure Laterality Date  ? A-FLUTTER ABLATION N/A 09/12/2021  ? Procedure: A-FLUTTER ABLATION;  Surgeon: Thompson Grayer, MD;  Location: [facility];  Service: Cardiovascular;  Laterality: N/A;  ? ABLATION  11-24-14  ? CTI ablation by Dr Rayann Heman  ? ANKLE SURGERY    ? ATRIAL FLUTTER ABLATION N/A 11/24/2014  ? Procedure: ATRIAL  FLUTTER ABLATION;  Surgeon: Thompson Grayer, MD;  Location: [facility] CATH LAB;  Service: Cardiovascular;  Laterality: N/A;  ? Carpel tunnel    ? COLONOSCOPY  08/04/2002  ? Normal Exam- Dr Henrene Pastor  ? INGUINAL HERNIA REPAIR  2008  ? TONSILLECTOMY    ? ? ?Social History  ? ?Socioeconomic History  ? Marital status: Married  ?  Spouse name: Not on file  ? Number of children: 1  ? Years of education: Not on file  ? Highest education level: Not on file  ?Occupational History  ?  Comment: Sales  ?Tobacco Use  ? Smoking status: Former  ? Smokeless tobacco: Never  ? Tobacco comments:  ?  5-10 pack year history  ?Vaping Use  ? Vaping  Use: Never used  ?Substance and Sexual Activity  ? Alcohol use: Yes  ?  Comment: 2-3 glasses of wine per day  ? Drug use: No  ? Sexual activity: Yes  ?Other Topics Concern  ? Not on file  ?Social History Narrative  ? 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 Married- 35 years  ? 21 daughter -junior in   ([place]  ? Previous smoker - 5-10 pack yrs    ? Alcohol use-yes (2-3 glasses of wine)    ? ?Social Determinants of Health  ? ?Financial Resource Strain: Low Risk   ? Difficulty of Paying Living Expenses: Not hard at all  ?Food Insecurity: No Food Insecurity  ? Worried About Charity fundraiser in the Last Year: Never true  ? Ran Out of Food in the Last Year: Never true  ?Transportation Needs: No Transportation Needs  ? Lack of Transportation (Medical): No  ? Lack of Transportation (Non-Medical): No  ?Physical Activity: Not on file  ?Stress: No Stress Concern Present  ? Feeling of Stress : Not at all  ?Social Connections: Socially Integrated  ? Frequency of Communication with Friends and Family: More than three times a week  ? Frequency of Social Gatherings with Friends and Family: More than three times a week  ? Attends Religious Services: More than 4 times per year  ? Active Member of Clubs or Organizations: Yes  ? Attends Archivist Meetings: More than 4 times per year  ? Marital Status: Married  ?Intimate Partner Violence: Not At Risk  ? Fear of Current or Ex-Partner: No  ? Emotionally Abused: No  ? Physically Abused: No  ? Sexually Abused: No  ? ? ?Family History  ?Problem Relation Age of Onset  ? Coronary artery disease Father   ?     aortic valve disease  ? Heart attack Father 1  ? COPD Father   ? Hypertension Father   ? Hyperlipidemia Father   ? Dementia Father   ? Diabetes type II Maternal Grandfather   ? Diabetes Maternal Grandfather   ? Arthritis Mother   ? Stroke Mother   ? Diabetes Sister   ? Hyperlipidemia Other   ? Hypertension Other   ? ? ?ROS: no  fevers or chills, productive cough, hemoptysis, dysphasia, odynophagia, melena, hematochezia, dysuria, hematuria, rash, seizure activity, orthopnea, PND, pedal edema, claudication. Remaining systems are negative. ? ?Physical Exam: ?Well-developed well-nourished in no acute distress.  ?Skin is warm and dry.  ?HEENT is normal.  ?Neck is supple.  ?Chest is clear to auscultation with normal expansion.  ?Cardiovascular exam is regular rate and rhythm.  ?Abdominal exam nontender or distended. No masses palpated. ?Extremities show no edema. ?neuro  grossly intact ? ?ECG-October 17, 2021-normal sinus rhythm.  Personally reviewed ? ?A/P ? ?1 atrial flutter-status post ablation.  He is in sinus rhythm on examination today. ? ?2 hypertension-patient's blood pressure is controlled.  Continue present medications. ? ?3 hyperlipidemia-continue statin. ? ?Kirk Ruths, MD ? ? ? ?

## 2022-01-29 ENCOUNTER — Ambulatory Visit: Payer: Medicare Other | Admitting: Cardiology

## 2022-01-29 ENCOUNTER — Encounter: Payer: Self-pay | Admitting: Cardiology

## 2022-01-29 VITALS — BP 124/64 | HR 80 | Ht 72.0 in | Wt 216.0 lb

## 2022-01-29 DIAGNOSIS — E782 Mixed hyperlipidemia: Secondary | ICD-10-CM

## 2022-01-29 DIAGNOSIS — I10 Essential (primary) hypertension: Secondary | ICD-10-CM

## 2022-01-29 DIAGNOSIS — I4892 Unspecified atrial flutter: Secondary | ICD-10-CM | POA: Diagnosis not present

## 2022-01-29 NOTE — Patient Instructions (Signed)

## 2022-02-05 DIAGNOSIS — D225 Melanocytic nevi of trunk: Secondary | ICD-10-CM | POA: Diagnosis not present

## 2022-02-05 DIAGNOSIS — Z129 Encounter for screening for malignant neoplasm, site unspecified: Secondary | ICD-10-CM | POA: Diagnosis not present

## 2022-02-05 DIAGNOSIS — Z85828 Personal history of other malignant neoplasm of skin: Secondary | ICD-10-CM | POA: Diagnosis not present

## 2022-03-05 ENCOUNTER — Other Ambulatory Visit: Payer: Self-pay

## 2022-03-05 MED ORDER — LISINOPRIL-HYDROCHLOROTHIAZIDE 20-12.5 MG PO TABS: 1.0000 | ORAL_TABLET | Freq: Every day | ORAL | 0 refills | Status: DC

## 2022-03-08 ENCOUNTER — Other Ambulatory Visit: Payer: Self-pay | Admitting: Family Medicine

## 2022-03-26 ENCOUNTER — Other Ambulatory Visit: Payer: Self-pay | Admitting: Family Medicine

## 2022-05-01 ENCOUNTER — Encounter: Payer: Medicare Other | Admitting: Family Medicine

## 2022-05-12 ENCOUNTER — Encounter: Payer: Medicare Other | Admitting: Family Medicine

## 2022-05-15 ENCOUNTER — Other Ambulatory Visit: Payer: Self-pay | Admitting: Family Medicine

## 2022-06-16 NOTE — Assessment & Plan Note (Signed)
Well controlled, no changes to meds. Encouraged heart healthy diet such as the DASH diet and exercise as tolerated.  °

## 2022-06-16 NOTE — Assessment & Plan Note (Signed)
hgba1c acceptable, minimize simple carbs. Increase exercise as tolerated.  

## 2022-06-16 NOTE — Assessment & Plan Note (Addendum)
Patient encouraged to maintain heart healthy diet, regular exercise, adequate sleep. Consider daily probiotics. Take medications as prescribed. Labs ordered and reviewed. Colonoscopy 2011, referred for repeat colonoscopy

## 2022-06-16 NOTE — Assessment & Plan Note (Signed)
Hydrate and monitor 

## 2022-06-16 NOTE — Assessment & Plan Note (Addendum)
Encourage heart healthy diet such as MIND or DASH diet, increase exercise, avoid trans fats, simple carbohydrates and processed foods, consider a krill or fish or flaxseed oil cap daily. Labs ordered and reviewed.

## 2022-06-16 NOTE — Assessment & Plan Note (Signed)
Revatio prn

## 2022-06-17 ENCOUNTER — Encounter: Payer: Self-pay | Admitting: Family Medicine

## 2022-06-17 ENCOUNTER — Encounter: Payer: Self-pay | Admitting: Internal Medicine

## 2022-06-17 ENCOUNTER — Ambulatory Visit (INDEPENDENT_AMBULATORY_CARE_PROVIDER_SITE_OTHER): Payer: Medicare Other | Admitting: Family Medicine

## 2022-06-17 VITALS — BP 128/68 | HR 106 | Temp 98.0°F | Resp 16 | Ht 72.0 in | Wt 212.0 lb

## 2022-06-17 DIAGNOSIS — I10 Essential (primary) hypertension: Secondary | ICD-10-CM | POA: Diagnosis not present

## 2022-06-17 DIAGNOSIS — R351 Nocturia: Secondary | ICD-10-CM | POA: Diagnosis not present

## 2022-06-17 DIAGNOSIS — N401 Enlarged prostate with lower urinary tract symptoms: Secondary | ICD-10-CM

## 2022-06-17 DIAGNOSIS — R079 Chest pain, unspecified: Secondary | ICD-10-CM | POA: Diagnosis not present

## 2022-06-17 DIAGNOSIS — R739 Hyperglycemia, unspecified: Secondary | ICD-10-CM

## 2022-06-17 DIAGNOSIS — E782 Mixed hyperlipidemia: Secondary | ICD-10-CM

## 2022-06-17 DIAGNOSIS — Z0000 Encounter for general adult medical examination without abnormal findings: Secondary | ICD-10-CM

## 2022-06-17 DIAGNOSIS — Z1211 Encounter for screening for malignant neoplasm of colon: Secondary | ICD-10-CM | POA: Diagnosis not present

## 2022-06-17 DIAGNOSIS — I4891 Unspecified atrial fibrillation: Secondary | ICD-10-CM

## 2022-06-17 LAB — LIPID PANEL
Cholesterol: 170 mg/dL (ref 0–200)
HDL: 50.3 mg/dL (ref >39.00)
LDL Cholesterol: 83 mg/dL (ref 0–99)
NonHDL: 119.82
Total CHOL/HDL Ratio: 3
Triglycerides: 183 mg/dL — ABNORMAL HIGH (ref 0.0–149.0)
VLDL: 36.6 mg/dL (ref 0.0–40.0)

## 2022-06-17 LAB — COMPREHENSIVE METABOLIC PANEL
ALT: 22 U/L (ref 0–53)
AST: 18 U/L (ref 0–37)
Albumin: 4.3 g/dL (ref 3.5–5.2)
Alkaline Phosphatase: 54 U/L (ref 39–117)
BUN: 12 mg/dL (ref 6–23)
CO2: 29 mEq/L (ref 19–32)
Calcium: 9.7 mg/dL (ref 8.4–10.5)
Chloride: 99 mEq/L (ref 96–112)
Creatinine, Ser: 0.86 mg/dL (ref 0.40–1.50)
GFR: 87.93 mL/min (ref >60.00)
Glucose, Bld: 137 mg/dL — ABNORMAL HIGH (ref 70–99)
Potassium: 4.1 mEq/L (ref 3.5–5.1)
Sodium: 141 mEq/L (ref 135–145)
Total Bilirubin: 0.9 mg/dL (ref 0.2–1.2)
Total Protein: 7.3 g/dL (ref 6.0–8.3)

## 2022-06-17 LAB — CBC
HCT: 45.7 % (ref 39.0–52.0)
Hemoglobin: 15.3 g/dL (ref 13.0–17.0)
MCHC: 33.4 g/dL (ref 30.0–36.0)
MCV: 98.4 fl (ref 78.0–100.0)
Platelets: 258 10*3/uL (ref 150.0–400.0)
RBC: 4.64 Mil/uL (ref 4.22–5.81)
RDW: 12.8 % (ref 11.5–15.5)
WBC: 6.8 10*3/uL (ref 4.0–10.5)

## 2022-06-17 LAB — TSH: TSH: 1.42 u[IU]/mL (ref 0.35–5.50)

## 2022-06-17 LAB — HEMOGLOBIN A1C: Hgb A1c MFr Bld: 6.8 % — ABNORMAL HIGH (ref 4.6–6.5)

## 2022-06-17 LAB — PSA: PSA: 5.41 ng/mL — ABNORMAL HIGH (ref 0.10–4.00)

## 2022-06-17 MED ORDER — METOPROLOL SUCCINATE ER 50 MG PO TB24: 50.0000 mg | ORAL_TABLET | Freq: Two times a day (BID) | ORAL | 1 refills | Status: DC

## 2022-06-17 MED ORDER — APIXABAN 5 MG PO TABS: 5.0000 mg | ORAL_TABLET | Freq: Two times a day (BID) | ORAL | 3 refills | Status: DC

## 2022-06-17 MED ORDER — TIZANIDINE HCL 2 MG PO TABS: 1.0000 mg | ORAL_TABLET | Freq: Two times a day (BID) | ORAL | 1 refills | Status: DC | PRN

## 2022-06-17 NOTE — Progress Notes (Signed)
Subjective:   By signing my name below, I, Kellie Simmering, attest that this documentation has been prepared under the direction and in the presence of Mosie Lukes, MD 06/17/2022.     Patient ID: William Phillips, male    DOB: 1950-12-06, 71 y.o.   MRN: [id number]  Chief Complaint  Patient presents with   Annual Exam    Annual Exam    HPI Patient is in today for a comprehensive physical exam.  He denies having any fever, chills, ear pain, headaches, muscle pain, joint pain, new moles, rash, itching, congestion, sinus pain, sore throat, chest pain, palpitations, wheezing, nausea, vomitting, abdominal pain, diarrhea, constipation, blood in stool, dysuria, urgency, frequency and hematuria.  Family history: His 72 year old sister was recently diagnosed with breast cancer. His mother had an irregular heart rhythm.   Immunizations: He has been informed about receiving COVID-19, high-dose Flu, PREVNAR 20, and RSV immunizations. He is up to date on Shingles and Tetanus immunizations.  Diet: He maintains a well-balanced diet and attempts to remain well-hydrated.   Exercise: He reports that he has recently been quite sedentary. He used to walk weekly but has not recently.    Dental: He is up to date on dental care. He has a dental appointment on 06/19/2022.   Social history: He will retire from work on 07/12/2022. He drinks a few glasses of red wine daily.   Lower back pain: He complains of lower back pain today. He reports that he slept on a soft mattress while on a cruise, which did not provide appropriate lower back support. He has been home for one week since the cruise and still experiencing lower back pain. He is interested in taking a muscle relaxer if his pain does not dissipate.   Cardiology: He reports having an atrial flutter in 10/2021. He sees his cardiologist, Dr. Rayann Heman, and is currently taking Amlodipine 10 mg and Metoprolol 50 mg. BP Readings from Last 3 Encounters:   06/17/22 128/68  01/29/22 124/64  10/17/21 100/64   Pulse Readings from Last 3 Encounters:  06/17/22 (!) 106  01/29/22 80  10/17/21 69   Urology: He does not currently see a urologist.   Past Medical History:  Diagnosis Date   Allergic rhinitis    Ankle fracture 1998   Atrial flutter ([place])    a. s/p CTI ablation by Dr Rayann Heman 11/2014   Back pain 08/05/2015   BPH (benign prostatic hyperplasia) 08/26/2015   Ganglion cyst of dorsum of right wrist 04/08/2017   GERD (gastroesophageal reflux disease)    Hammer toe of right foot 05/06/2013   History of carpal tunnel syndrome    Hyperlipidemia    Hypertension    Insect bite 04/23/2015   OA (osteoarthritis) 02/06/2013   knees   Pedal edema 04/08/2017   Preventative health care 04/27/2011   Had normal colonoscopy in 2011    Past Surgical History:  Procedure Laterality Date   A-FLUTTER ABLATION N/A 09/12/2021   Procedure: A-FLUTTER ABLATION;  Surgeon: Thompson Grayer, MD;  Location: [facility];  Service: Cardiovascular;  Laterality: N/A;   ABLATION  11-24-14   CTI ablation by Dr Rayann Heman   ANKLE SURGERY     ATRIAL FLUTTER ABLATION N/A 11/24/2014   Procedure: ATRIAL FLUTTER ABLATION;  Surgeon: Thompson Grayer, MD;  Location: [facility];  Service: Cardiovascular;  Laterality: N/A;   Carpel tunnel     COLONOSCOPY  08/04/2002   Normal Exam- Dr Fransico Meadow HERNIA  REPAIR  2008   TONSILLECTOMY     Family History  Problem Relation Age of Onset   Arthritis Mother    Stroke Mother    Coronary artery disease Father        aortic valve disease   Heart attack Father 48   COPD Father    Hypertension Father    Hyperlipidemia Father    Dementia Father    Cancer Sister 52       breast cancer   Diabetes Sister    Diabetes type II Maternal Grandfather    Diabetes Maternal Grandfather    Hyperlipidemia Other    Hypertension Other    Social History   Socioeconomic History   Marital status: Married    Spouse name: Not on file    Number of children: 1   Years of education: Not on file   Highest education level: Not on file  Occupational History    Comment: Sales  Tobacco Use   Smoking status: Former   Smokeless tobacco: Never   Tobacco comments:    5-10 pack year history  Vaping Use   Vaping Use: Never used  Substance and Sexual Activity   Alcohol use: Yes    Comment: 2-3 glasses of wine per day   Drug use: No   Sexual activity: Yes  Other Topics Concern   Not on file  Social History Narrative   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chemicals, [employer], [employer])   Married- 44 years   34 daughter -Paramedic in   [place]   Previous smoker - 5-10 pack yrs     Alcohol use-yes (2-3 glasses of wine)     Social Determinants of Health   Financial Resource Strain: Low Risk  (10/01/2021)   Overall Financial Resource Strain (CARDIA)    Difficulty of Paying Living Expenses: Not hard at all  Food Insecurity: No Food Insecurity (10/01/2021)   Hunger Vital Sign    Worried About Running Out of Food in the Last Year: Never true    Ran Out of Food in the Last Year: Never true  Transportation Needs: No Transportation Needs (10/01/2021)   PRAPARE - Hydrologist (Medical): No    Lack of Transportation (Non-Medical): No  Physical Activity: Not on file  Stress: No Stress Concern Present (10/01/2021)   [place]    Feeling of Stress : Not at all  Social Connections: [place] (10/01/2021)   Social Connection and Isolation Panel [NHANES]    Frequency of Communication with Friends and Family: More than three times a week    Frequency of Social Gatherings with Friends and Family: More than three times a week    Attends Religious Services: More than 4 times per year    Active Member of [employer] or Organizations: Yes    Attends Music therapist: More than 4 times per year    Marital Status:  Married  Human resources officer Violence: Not At Risk (10/01/2021)   Humiliation, Afraid, Rape, and Kick questionnaire    Fear of Current or Ex-Partner: No    Emotionally Abused: No    Physically Abused: No    Sexually Abused: No   Outpatient Medications Prior to Visit  Medication Sig Dispense Refill   amLODipine (NORVASC) 10 MG tablet TAKE ONE-HALF TABLET BY  MOUTH TWICE DAILY 90 tablet 3   Ascorbic Acid (VITA-C PO) Take 1 tablet by mouth daily.  Brimonidine Tartrate (LUMIFY) 0.025 % SOLN Place 1 drop into both eyes daily.     Carboxymethylcellulose Sodium (LUBRICANT EYE DROPS OP) Place 1 drop into both eyes 2 (two) times daily as needed (Dry eye).     Cholecalciferol (VITAMIN D3 PO) Take 1 tablet by mouth daily.     Clobetasol Propionate 0.05 % shampoo Apply 1 application topically daily as needed (irritation).     fexofenadine (ALLEGRA) 180 MG tablet Take 1 tablet (180 mg total) by mouth daily. (Patient taking differently: Take 180 mg by mouth daily as needed for allergies.)     fluticasone (FLONASE) 50 MCG/ACT nasal spray Place 2 sprays into both nostrils daily as needed for allergies or rhinitis.  2   ketoconazole (NIZORAL) 2 % shampoo Apply 1 application topically daily as needed for irritation.     lisinopril-hydrochlorothiazide (ZESTORETIC) 20-12.5 MG tablet TAKE 1 TABLET BY MOUTH DAILY 90 tablet 3   Multiple Vitamin (MULTIVITAMIN) tablet Take 1 tablet by mouth daily.     omeprazole (PRILOSEC) 20 MG capsule TAKE 1 CAPSULE BY MOUTH  DAILY 100 capsule 2   sildenafil (REVATIO) 20 MG tablet TAKE ONE TO FOUR TABLETS BY MOUTH DAILY AS NEEDED 90 tablet 1   simvastatin (ZOCOR) 20 MG tablet TAKE 1 TABLET BY MOUTH AT  BEDTIME 100 tablet 2   tamsulosin (FLOMAX) 0.4 MG CAPS capsule TAKE 1 CAPSULE BY MOUTH  DAILY 100 capsule 2   metoprolol succinate (TOPROL-XL) 50 MG 24 hr tablet Take 1 tablet (50 mg total) by mouth daily. Take with or immediately following a meal. 90 tablet 3   No  facility-administered medications prior to visit.   Allergies  Allergen Reactions   Penicillins     REACTION: swelling , irriation   Review of Systems  Constitutional:  Negative for chills and fever.  HENT:  Negative for congestion, ear pain, sinus pain and sore throat.   Respiratory:  Negative for cough, shortness of breath and wheezing.   Cardiovascular:  Negative for chest pain and palpitations.  Gastrointestinal:  Negative for abdominal pain, blood in stool, constipation, diarrhea, nausea and vomiting.  Genitourinary:  Negative for dysuria, frequency, hematuria and urgency.  Musculoskeletal:  Positive for back pain (Lower back pain). Negative for joint pain and myalgias.  Skin:  Negative for itching and rash.       (-) New moles.  Neurological:  Negative for headaches.      Objective:    Physical Exam Constitutional:      General: He is not in acute distress.    Appearance: Normal appearance. He is not ill-appearing.  HENT:     Head: Normocephalic and atraumatic.     Right Ear: Tympanic membrane, ear canal and external ear normal.     Left Ear: Tympanic membrane, ear canal and external ear normal.     Mouth/Throat:     Mouth: Mucous membranes are moist.     Pharynx: Oropharynx is clear.  Eyes:     Extraocular Movements: Extraocular movements intact.     Right eye: No nystagmus.     Left eye: No nystagmus.     Pupils: Pupils are equal, round, and reactive to light.  Neck:     Vascular: No carotid bruit.  Cardiovascular:     Rate and Rhythm: Tachycardia present. Rhythm irregular.     Pulses: Normal pulses.     Heart sounds: Normal heart sounds. No murmur heard.    No gallop.  Pulmonary:     Effort:  Pulmonary effort is normal. No respiratory distress.     Breath sounds: Normal breath sounds. No wheezing or rales.  Abdominal:     General: Bowel sounds are normal.     Palpations: Abdomen is soft.     Tenderness: There is no abdominal tenderness.  Musculoskeletal:      Comments: Muscle strength 5/5 on upper and lower extremities.   Lymphadenopathy:     Cervical: No cervical adenopathy.  Skin:    General: Skin is warm and dry.  Neurological:     Mental Status: He is alert and oriented to person, place, and time.     Deep Tendon Reflexes:     Reflex Scores:      Patellar reflexes are 2+ on the right side and 2+ on the left side. Psychiatric:        Mood and Affect: Mood normal.        Behavior: Behavior normal.        Judgment: Judgment normal.    BP 128/68 (BP Location: Right Arm, Patient Position: Sitting, Cuff Size: Normal)   Pulse (!) 106   Temp 98 F (36.7 C) (Oral)   Resp 16   Ht 6' (1.829 m)   Wt 212 lb (96.2 kg)   SpO2 97%   BMI 28.75 kg/m  Wt Readings from Last 3 Encounters:  06/17/22 212 lb (96.2 kg)  01/29/22 216 lb (98 kg)  10/17/21 213 lb (96.6 kg)   Diabetic Foot Exam - Simple   No data filed    Lab Results  Component Value Date   WBC 6.0 08/22/2021   HGB 14.5 08/22/2021   HCT 43.8 08/22/2021   PLT 167 08/22/2021   GLUCOSE 165 (H) 08/22/2021   CHOL 184 04/02/2021   TRIG 189.0 (H) 04/02/2021   HDL 68.40 04/02/2021   LDLCALC 78 04/02/2021   ALT 21 04/29/2021   AST 20 04/29/2021   NA 137 08/22/2021   K 4.1 08/22/2021   CL 99 08/22/2021   CREATININE 0.87 08/22/2021   BUN 15 08/22/2021   CO2 27 08/22/2021   TSH 0.91 04/29/2021   PSA 2.33 02/16/2020   HGBA1C 6.2 04/29/2021   MICROALBUR 0.7 10/19/2015   Lab Results  Component Value Date   TSH 0.91 04/29/2021   Lab Results  Component Value Date   WBC 6.0 08/22/2021   HGB 14.5 08/22/2021   HCT 43.8 08/22/2021   MCV 98 (H) 08/22/2021   PLT 167 08/22/2021   Lab Results  Component Value Date   NA 137 08/22/2021   K 4.1 08/22/2021   CO2 27 08/22/2021   GLUCOSE 165 (H) 08/22/2021   BUN 15 08/22/2021   CREATININE 0.87 08/22/2021   BILITOT 0.7 04/29/2021   ALKPHOS 55 04/29/2021   AST 20 04/29/2021   ALT 21 04/29/2021   PROT 7.0 04/29/2021   ALBUMIN  4.4 04/29/2021   CALCIUM 9.2 08/22/2021   ANIONGAP 15 09/25/2014   EGFR 93 08/22/2021   GFR 87.72 04/29/2021   Lab Results  Component Value Date   CHOL 184 04/02/2021   Lab Results  Component Value Date   HDL 68.40 04/02/2021   Lab Results  Component Value Date   LDLCALC 78 04/02/2021   Lab Results  Component Value Date   TRIG 189.0 (H) 04/02/2021   Lab Results  Component Value Date   CHOLHDL 3 04/02/2021   Lab Results  Component Value Date   HGBA1C 6.2 04/29/2021     Colonoscopy: Last completed  on 08/22/2010. Repeat in 10 years. Normal results.  PSA: Last completed on 02/16/2020. Repeat in 2 years. Normal results.   Assessment & Plan:   Problem List Items Addressed This Visit     Hyperlipidemia, mixed    Encourage heart healthy diet such as MIND or DASH diet, increase exercise, avoid trans fats, simple carbohydrates and processed foods, consider a krill or fish or flaxseed oil cap daily. Labs ordered and reviewed.       Relevant Medications   apixaban (ELIQUIS) 5 MG TABS tablet   metoprolol succinate (TOPROL-XL) 50 MG 24 hr tablet   Other Relevant Orders   Lipid panel   Essential hypertension    Well controlled, no changes to meds. Encouraged heart healthy diet such as the DASH diet and exercise as tolerated.       Relevant Medications   apixaban (ELIQUIS) 5 MG TABS tablet   metoprolol succinate (TOPROL-XL) 50 MG 24 hr tablet   Other Relevant Orders   CBC   Comprehensive metabolic panel   TSH   EKG 12-Lead (Completed)   Hyperglycemia    hgba1c acceptable, minimize simple carbs. Increase exercise as tolerated.      Relevant Orders   Hemoglobin A1c   Atrial fibrillation (HCC)    Asymptomatic, has recurred despite 2 ablations. Is started back on Eliquis 5 mg po bid and given some samples. Increase his Metoprolol to 50 mg bid and maintain Amlodipine. Referred back to cardiology for further consideration      Relevant Medications   apixaban (ELIQUIS)  5 MG TABS tablet   metoprolol succinate (TOPROL-XL) 50 MG 24 hr tablet   Other Relevant Orders   Ambulatory referral to Cardiology   BPH (benign prostatic hyperplasia)    Up 2-3 x a night but no dysuria or hematuria, check labs today      Nocturia    Hydrate and monitor      Relevant Orders   PSA   Preventative health care    Patient encouraged to maintain heart healthy diet, regular exercise, adequate sleep. Consider daily probiotics. Take medications as prescribed. Labs ordered and reviewed. Colonoscopy 2011, referred for repeat colonoscopy      Other Visit Diagnoses     Colon cancer screening    -  Primary   Relevant Orders   Ambulatory referral to Gastroenterology   Chest pain in adult       Relevant Orders   EKG 12-Lead (Completed)      Meds ordered this encounter  Medications   tiZANidine (ZANAFLEX) 2 MG tablet    Sig: Take 0.5-2 tablets (1-4 mg total) by mouth 2 (two) times daily as needed for muscle spasms.    Dispense:  30 tablet    Refill:  1   apixaban (ELIQUIS) 5 MG TABS tablet    Sig: Take 1 tablet (5 mg total) by mouth 2 (two) times daily.    Dispense:  60 tablet    Refill:  3   metoprolol succinate (TOPROL-XL) 50 MG 24 hr tablet    Sig: Take 1 tablet (50 mg total) by mouth 2 (two) times daily. Take with or immediately following a meal.    Dispense:  180 tablet    Refill:  1   I, Penni Homans, MD, personally preformed the services described in this documentation.  All medical record entries made by the scribe were at my direction and in my presence.  I have reviewed the chart and discharge instructions (if applicable) and agree  that the record reflects my personal performance and is accurate and complete. 06/17/2022  I,Mohammed Iqbal,acting as a scribe for Penni Homans, MD.,have documented all relevant documentation on the behalf of Penni Homans, MD,as directed by  Penni Homans, MD while in the presence of Penni Homans, MD.  Penni Homans, MD

## 2022-06-17 NOTE — Assessment & Plan Note (Signed)
Asymptomatic, has recurred despite 2 ablations. Is started back on Eliquis 5 mg po bid and given some samples. Increase his Metoprolol to 50 mg bid and maintain Amlodipine. Referred back to cardiology for further consideration

## 2022-06-17 NOTE — Assessment & Plan Note (Addendum)
Up 2-3 x a night but no dysuria or hematuria, check labs today

## 2022-06-17 NOTE — Patient Instructions (Addendum)
Tetanus if injured  RSV (respiratory syncitial virus) vaccine at pharmacy Covid booster when new version out late September At pharmacy High dose flu shot mid Sept to mid Oct  Prevnar 20 is the next pneumonia at pharmacy   Tylenol 500 mg tabs, 2 tabs every 8 hours alternating with Ibuprofen/Advil/Motrin 200 mg tabs 2 tabs every 8 hours as well  Acute Back Pain, Adult Acute back pain is sudden and usually short-lived. It is often caused by an injury to the muscles and tissues in the back. The injury may result from: A muscle, tendon, or ligament getting overstretched or torn. Ligaments are tissues that connect bones to each other. Lifting something improperly can cause a back strain. Wear and tear (degeneration) of the spinal disks. Spinal disks are circular tissue that provide cushioning between the bones of the spine (vertebrae). Twisting motions, such as while playing sports or doing yard work. A hit to the back. Arthritis. You may have a physical exam, lab tests, and imaging tests to find the cause of your pain. Acute back pain usually goes away with rest and home care. Follow these instructions at home: Managing pain, stiffness, and swelling Take over-the-counter and prescription medicines only as told by your health care provider. Treatment may include medicines for pain and inflammation that are taken by mouth or applied to the skin, or muscle relaxants. Your health care provider may recommend applying ice during the first 24-48 hours after your pain starts. To do this: Put ice in a plastic bag. Place a towel between your skin and the bag. Leave the ice on for 20 minutes, 2-3 times a day. Remove the ice if your skin turns bright red. This is very important. If you cannot feel pain, heat, or cold, you have a greater risk of damage to the area. If directed, apply heat to the affected area as often as told by your health care provider. Use the heat source that your health care provider  recommends, such as a moist heat pack or a heating pad. Place a towel between your skin and the heat source. Leave the heat on for 20-30 minutes. Remove the heat if your skin turns bright red. This is especially important if you are unable to feel pain, heat, or cold. You have a greater risk of getting burned. Activity  Do not stay in bed. Staying in bed for more than 1-2 days can delay your recovery. Sit up and stand up straight. Avoid leaning forward when you sit or hunching over when you stand. If you work at a desk, sit close to it so you do not need to lean over. Keep your chin tucked in. Keep your neck drawn back, and keep your elbows bent at a 90-degree angle (right angle). Sit high and close to the steering wheel when you drive. Add lower back (lumbar) support to your car seat, if needed. Take short walks on even surfaces as soon as you are able. Try to increase the length of time you walk each day. Do not sit, drive, or stand in one place for more than 30 minutes at a time. Sitting or standing for long periods of time can put stress on your back. Do not drive or use heavy machinery while taking prescription pain medicine. Use proper lifting techniques. When you bend and lift, use positions that put less stress on your back: [place] your knees. Keep the load close to your body. Avoid twisting. Exercise regularly as told by your health care  provider. Exercising helps your back heal faster and helps prevent back injuries by keeping muscles strong and flexible. Work with a physical therapist to make a safe exercise program, as recommended by your health care provider. Do any exercises as told by your physical therapist. Lifestyle Maintain a healthy weight. Extra weight puts stress on your back and makes it difficult to have good posture. Avoid activities or situations that make you feel anxious or stressed. Stress and anxiety increase muscle tension and can make back pain worse. Learn ways to  manage anxiety and stress, such as through exercise. General instructions Sleep on a firm mattress in a comfortable position. Try lying on your side with your knees slightly bent. If you lie on your back, put a pillow under your knees. Keep your head and neck in a straight line with your spine (neutral position) when using electronic equipment like smartphones or pads. To do this: Raise your smartphone or pad to look at it instead of bending your head or neck to look down. Put the smartphone or pad at the level of your face while looking at the screen. Follow your treatment plan as told by your health care provider. This may include: Cognitive or behavioral therapy. Acupuncture or massage therapy. Meditation or yoga. Contact a health care provider if: You have pain that is not relieved with rest or medicine. You have increasing pain going down into your legs or buttocks. Your pain does not improve after 2 weeks. You have pain at night. You lose weight without trying. You have a fever or chills. You develop nausea or vomiting. You develop abdominal pain. Get help right away if: You develop new bowel or bladder control problems. You have unusual weakness or numbness in your arms or legs. You feel faint. These symptoms may represent a serious problem that is an emergency. Do not wait to see if the symptoms will go away. Get medical help right away. Call your local emergency services (911 in the [place] Do not drive yourself to the hospital. Summary Acute back pain is sudden and usually short-lived. Use proper lifting techniques. When you bend and lift, use positions that put less stress on your back. Take over-the-counter and prescription medicines only as told by your health care provider, and apply heat or ice as told. This information is not intended to replace advice given to you by your health care provider. Make sure you discuss any questions you have with your health care  provider. Document Revised: 12/21/2020 Document Reviewed: 12/21/2020 Elsevier Patient Education  [place].

## 2022-06-18 ENCOUNTER — Other Ambulatory Visit: Payer: Self-pay

## 2022-06-18 DIAGNOSIS — E782 Mixed hyperlipidemia: Secondary | ICD-10-CM

## 2022-06-18 DIAGNOSIS — R7989 Other specified abnormal findings of blood chemistry: Secondary | ICD-10-CM

## 2022-06-18 DIAGNOSIS — R739 Hyperglycemia, unspecified: Secondary | ICD-10-CM

## 2022-06-18 DIAGNOSIS — I10 Essential (primary) hypertension: Secondary | ICD-10-CM

## 2022-06-20 ENCOUNTER — Telehealth: Payer: Self-pay | Admitting: *Deleted

## 2022-06-20 NOTE — Telephone Encounter (Signed)
Please call this patient verify if he is taking a blood thinner medication called Eliquis. If so he needs an office visit. Thanks, Hayden Mabin PV

## 2022-06-22 NOTE — Progress Notes (Unsigned)
06/23/2022 SAKIB NOGUEZ [id number] Nov 16, 1950   CHIEF COMPLAINT: Schedule a colonoscopy  HISTORY OF PRESENT ILLNESS: Dallyn Bergland is a 71 year old male with a past medical history of hypertension, hyperlipidemia, atrial flutter s/p ablation 11/2014 and 09/2021 with recent onset atrial fibrillation on Eliquis. He presents to our office today as referred by Dr. Charlett Blake to schedule a screening colonoscopy.  He denies having any upper or lower abdominal pain.  He is passing a normal formed brown bowel movement daily.  No rectal bleeding or black stools.  His most recent colonoscopy by Dr. Henrene Pastor was 08/22/2010 which was normal.  Colonoscopy in 2003 was normal.  No known family history of colorectal cancer.  He has a history of GERD for which she has taken Omeprazole 20 mg every other day for more than 5 years.  If he skips 3-4 consecutive days of omeprazole he develops heartburn.  No dysphagia.  No NSAID use.  Denies ever having an EGD.  He developed lower back pain after he and his wife went on a cruise to [place] a few weeks ago which resolved after he took a muscle relaxant for 2 days.  He has a history of atrial flutter status post ablation x 2.  He was seen by Dr. Charlett Blake for his annual physical 06/17/2022 and he found to be in atrial fibrillation.  He was asymptomatic.  He was placed on Eliquis and Metoprolol '50mg'$  was increased from once daly to twice daily. He is scheduled to see his cardiologist Dr. Stanford Breed 06/30/2022.  He denies having any chest pain, palpitations, dizziness or shortness of breath.      Latest Ref Rng & Units 06/17/2022   12:14 PM 08/22/2021    8:53 AM 04/02/2021   12:22 PM  CBC  WBC 4.0 - 10.5 K/uL 6.8  6.0  10.2   Hemoglobin 13.0 - 17.0 g/dL 15.3  14.5  16.1   Hematocrit 39.0 - 52.0 % 45.7  43.8  48.1   Platelets 150.0 - 400.0 K/uL 258.0  167  261.0        Latest Ref Rng & Units 06/17/2022   12:14 PM 08/22/2021    8:53 AM 04/29/2021    9:54 AM  CMP  Glucose 70 -  99 mg/dL 137  165  132   BUN 6 - 23 mg/dL '12  15  14   '$ Creatinine 0.40 - 1.50 mg/dL 0.86  0.87  0.89   Sodium 135 - 145 mEq/L 141  137  140   Potassium 3.5 - 5.1 mEq/L 4.1  4.1  4.2   Chloride 96 - 112 mEq/L 99  99  101   CO2 19 - 32 mEq/L '29  27  31   '$ Calcium 8.4 - 10.5 mg/dL 9.7  9.2  9.4   Total Protein 6.0 - 8.3 g/dL 7.3   7.0   Total Bilirubin 0.2 - 1.2 mg/dL 0.9   0.7   Alkaline Phos 39 - 117 U/L 54   55   AST 0 - 37 U/L 18   20   ALT 0 - 53 U/L 22   21     ECHO 06/21/2021: Left ventricular ejection fraction, by estimation, is 50 to 55%. The left ventricle has low normal function. The left ventricle has no regional wall motion abnormalities. Left ventricular diastolic parameters are consistent with Grade I diastolic dysfunction (impaired relaxation). 1. 2. Right ventricular systolic function is normal. The right ventricular size is normal. The  mitral valve is normal in structure. No evidence of mitral valve regurgitation. No evidence of mitral stenosis. 3. The aortic valve is normal in structure. Aortic valve regurgitation is trivial. Mild aortic valve sclerosis is present, with no evidence of aortic valve stenosis. 4. The inferior vena cava is normal in size with greater than 50% respiratory variability, suggesting right atrial pressure of 3 mmHg.  PAST GI PROCEDURES:  Colonoscopy 08/09/2002: Normal   Colonoscopy 08/22/2010 by Dr. Henrene Pastor: Normal colonoscopy. 10 year recall.    Past Medical History:  Diagnosis Date   Allergic rhinitis    Ankle fracture 1998   Atrial flutter ([place])    a. s/p CTI ablation by Dr Rayann Heman 11/2014   Back pain 08/05/2015   BPH (benign prostatic hyperplasia) 08/26/2015   Ganglion cyst of dorsum of right wrist 04/08/2017   GERD (gastroesophageal reflux disease)    Hammer toe of right foot 05/06/2013   History of carpal tunnel syndrome    Hyperlipidemia    Hypertension    Insect bite 04/23/2015   OA (osteoarthritis) 02/06/2013   knees   Pedal  edema 04/08/2017   Preventative health care 04/27/2011   Had normal colonoscopy in 2011    Past Surgical History:  Procedure Laterality Date   A-FLUTTER ABLATION N/A 09/12/2021   Procedure: A-FLUTTER ABLATION;  Surgeon: Thompson Grayer, MD;  Location: [facility];  Service: Cardiovascular;  Laterality: N/A;   ABLATION  11-24-14   CTI ablation by Dr Rayann Heman   ANKLE SURGERY     ATRIAL FLUTTER ABLATION N/A 11/24/2014   Procedure: ATRIAL FLUTTER ABLATION;  Surgeon: Thompson Grayer, MD;  Location: [facility];  Service: Cardiovascular;  Laterality: N/A;   Carpel tunnel     COLONOSCOPY  08/04/2002   Normal Exam- Dr Fransico Meadow HERNIA REPAIR  2008   TONSILLECTOMY     Social History: He quit smoking 35 years ago. He drinks 1 to 2 glasses of wine daily.  No drug use.  Family History: Sister with breast cancer.  No family history of esophageal, gastric or colon cancer.  Father had heart disease, hypertension, COPD and dementia.  Mother had hypertension and a stroke.  Allergies  Allergen Reactions   Penicillins     REACTION: swelling , irriation      Outpatient Encounter Medications as of 06/23/2022  Medication Sig   amLODipine (NORVASC) 10 MG tablet TAKE ONE-HALF TABLET BY  MOUTH TWICE DAILY   apixaban (ELIQUIS) 5 MG TABS tablet Take 1 tablet (5 mg total) by mouth 2 (two) times daily.   Brimonidine Tartrate (LUMIFY) 0.025 % SOLN Place 1 drop into both eyes daily.   Carboxymethylcellulose Sodium (LUBRICANT EYE DROPS OP) Place 1 drop into both eyes 2 (two) times daily as needed (Dry eye).   Cholecalciferol (VITAMIN D3 PO) Take 1 tablet by mouth daily.   Clobetasol Propionate 0.05 % shampoo Apply 1 application topically daily as needed (irritation).   fexofenadine (ALLEGRA) 180 MG tablet Take 1 tablet (180 mg total) by mouth daily. (Patient taking differently: Take 180 mg by mouth daily as needed for allergies.)   fluticasone (FLONASE) 50 MCG/ACT nasal spray Place 2 sprays into both  nostrils daily as needed for allergies or rhinitis.   ketoconazole (NIZORAL) 2 % shampoo Apply 1 application topically daily as needed for irritation.   lisinopril-hydrochlorothiazide (ZESTORETIC) 20-12.5 MG tablet TAKE 1 TABLET BY MOUTH DAILY   metoprolol succinate (TOPROL-XL) 50 MG 24 hr tablet Take 1 tablet (50 mg total)  by mouth 2 (two) times daily. Take with or immediately following a meal.   Multiple Vitamin (MULTIVITAMIN) tablet Take 1 tablet by mouth daily.   omeprazole (PRILOSEC) 20 MG capsule TAKE 1 CAPSULE BY MOUTH  DAILY   sildenafil (REVATIO) 20 MG tablet TAKE ONE TO FOUR TABLETS BY MOUTH DAILY AS NEEDED   simvastatin (ZOCOR) 20 MG tablet TAKE 1 TABLET BY MOUTH AT  BEDTIME   tamsulosin (FLOMAX) 0.4 MG CAPS capsule TAKE 1 CAPSULE BY MOUTH  DAILY   tiZANidine (ZANAFLEX) 2 MG tablet Take 0.5-2 tablets (1-4 mg total) by mouth 2 (two) times daily as needed for muscle spasms.   Ascorbic Acid (VITA-C PO) Take 1 tablet by mouth daily. (Patient not taking: Reported on 06/23/2022)   [DISCONTINUED] metoprolol succinate (TOPROL-XL) 50 MG 24 hr tablet Take 1 tablet (50 mg total) by mouth 2 (two) times daily. Take with or immediately following a meal.   No facility-administered encounter medications on file as of 06/23/2022.     REVIEW OF SYSTEMS:  Gen: Denies fever, sweats or chills. No weight loss.  CV: Denies chest pain, palpitations or edema. Resp: Denies cough, shortness of breath of hemoptysis.  GI: See HPI. GU : Denies urinary burning, blood in urine, increased urinary frequency or incontinence. MS: + Back pain, resolved.  Derm: Denies rash, itchiness, skin lesions or unhealing ulcers. Psych: Denies depression, anxiety or memory loss. Heme: Denies bruising, easy bleeding. Neuro:  Denies headaches, dizziness or paresthesias. Endo:  Denies any problems with DM, thyroid or adrenal function.  PHYSICAL EXAM: BP 118/72   Pulse (!) 105   Ht 6' (1.829 m)   Wt 212 lb 12.8 oz (96.5 kg)    SpO2 98%   BMI 28.86 kg/m  General: Well developed ... in no acute distress. Head: Normocephalic and atraumatic. Eyes:  Sclerae non-icteric, conjunctive pink. Ears: Normal auditory acuity. Mouth: Dentition intact. No ulcers or lesions.  Neck: Supple, no lymphadenopathy or thyromegaly.  Lungs: Clear bilaterally to auscultation without wheezes, crackles or rhonchi. Heart: Regular rate and rhythm. No murmur, rub or gallop appreciated.  Abdomen: Soft, nontender, non distended. No masses. No hepatosplenomegaly. Normoactive bowel sounds x 4 quadrants.  Rectal:  Musculoskeletal: Symmetrical with no gross deformities. Skin: Warm and dry. No rash or lesions on visible extremities. Extremities: No edema. Neurological: Alert oriented x 4, no focal deficits.  Psychological:  Alert and cooperative. Normal mood and affect.  ASSESSMENT AND PLAN:  52) 71 year old male past due for a screening colonoscopy. Normal colonoscopy in 2003 and 2011. No known family history of colon cancer.  Hemoglobin 15.3. -Schedule a colonoscopy with Dr. Henrene Pastor after cardiac clearance received.  -See plan in #3  2) GERD, on Omeprazole '20mg'$  QOD x 5+ years. He develops acid reflux symptoms if he skips 3 to 4 consecutive days. -Continue omeprazole 20 mg QOD, increase to QD if reflux symptoms worsen  -EGD at time of colonoscopy -See plan in #3  3) History of atrial flutter s/p ablation in 2016 and 09/2021. New onset atrial fibrillation, started on Eliquis 06/17/2022.  He remains asymptomatic. -Endoscopic evaluation deferred for now, will likely need to wait a few months. Await further recommendations regarding timing of EGD/colonoscopy per Dr. Stanford Breed in setting of recent onset atrial fib  -Cardiac clearance to  also include Eliquis instructions required prior to scheduling a future EGD/colonoscopy    CC:  Mosie Lukes, MD

## 2022-06-23 ENCOUNTER — Telehealth: Payer: Self-pay | Admitting: Family Medicine

## 2022-06-23 ENCOUNTER — Other Ambulatory Visit: Payer: Self-pay

## 2022-06-23 ENCOUNTER — Telehealth: Payer: Self-pay

## 2022-06-23 ENCOUNTER — Encounter: Payer: Self-pay | Admitting: Nurse Practitioner

## 2022-06-23 ENCOUNTER — Ambulatory Visit: Payer: Medicare Other | Admitting: Nurse Practitioner

## 2022-06-23 VITALS — BP 118/72 | HR 105 | Ht 72.0 in | Wt 212.8 lb

## 2022-06-23 DIAGNOSIS — Z1211 Encounter for screening for malignant neoplasm of colon: Secondary | ICD-10-CM | POA: Diagnosis not present

## 2022-06-23 DIAGNOSIS — K219 Gastro-esophageal reflux disease without esophagitis: Secondary | ICD-10-CM

## 2022-06-23 MED ORDER — METOPROLOL SUCCINATE ER 50 MG PO TB24: 50.0000 mg | ORAL_TABLET | Freq: Two times a day (BID) | ORAL | 1 refills | Status: DC

## 2022-06-23 NOTE — Patient Instructions (Signed)
If you are age 70 or older, your body mass index should be between 23-30. Your Body mass index is 28.86 kg/m. If this is out of the aforementioned range listed, please consider follow up with your Primary Care Provider.  If you are age 64 or younger, your body mass index should be between 19-25. Your Body mass index is 28.86 kg/m. If this is out of the aformentioned range listed, please consider follow up with your Primary Care Provider.   ________________________________________________________  The [facility] providers would like to encourage you to use [facility] to communicate with providers for non-urgent requests or questions.  Due to long hold times on the telephone, sending your provider a message by [facility] may be a faster and more efficient way to get a response.  Please allow 48 business hours for a response.  Please remember that this is for non-urgent requests.  _______________________________________________________   We will contact Dr.Crenshaw for cardiac clearance on holding Eliquis prior to scheduling an endoscopy and colonoscopy.   It was a pleasure to see you today!  Thank you for trusting me with your gastrointestinal care!

## 2022-06-23 NOTE — Telephone Encounter (Signed)
Pt called stating that he would like to have his metoprolol rerouted to the following pharmacy:  [facility] ([facility]) - [place], [place]  [address] [place] [address]  Phone:  [phone]  Fax:  [phone]

## 2022-06-23 NOTE — Progress Notes (Signed)
Assessment and plan as noted 

## 2022-06-23 NOTE — Telephone Encounter (Signed)
 [facility] Pre-operative Risk Assessment     Request for surgical clearance:     Endoscopy Procedure  What type of surgery is being performed?     EGD/Colon  When is this surgery scheduled?     TBD  What type of clearance is required ?   Cardiac  Are there any medications that need to be held prior to surgery and how long? Eliquis x 2 days  Practice name and name of physician performing surgery?      [facility]  What is your office phone and fax number?      Phone- [phone]  [phone]  Anesthesia type (None, local, MAC, general) ?       MAC

## 2022-06-23 NOTE — Telephone Encounter (Signed)
Medication was sent

## 2022-06-24 NOTE — Telephone Encounter (Signed)
CORRECTION: PRE OP APPT IS WITH DR. CRENSHAW, NOT DR. MUNLEY.

## 2022-06-24 NOTE — Telephone Encounter (Signed)
Primary Cardiologist:None  Chart reviewed as part of pre-operative protocol coverage. Because of William Phillips's past medical history and time since last visit, he/she will require a follow-up visit in order to better assess preoperative cardiovascular risk.  Pre-op covering staff: - Patient has appointment with Dr. Bettina Gavia on 06/30/22 at which time clearance can be addressed.  - Please contact requesting surgeon's office via preferred method (i.e, phone, fax) to inform them of need for appointment prior to surgery.  If applicable, this message will also be routed to pharmacy pool for input on holding anticoagulant agent as requested below so that this information is available at time of patient's appointment.   Emmaline Life, NP-C     06/24/2022, 11:41 AM [address] Office [phone] Fax [phone]

## 2022-06-25 NOTE — Telephone Encounter (Signed)
Patient with diagnosis of atrial fibrillation on Eliquis for anticoagulation.    Procedure: EGD/colonoscopy Date of procedure: TBD   CHA2DS2-VASc Score = 2   This indicates a 2.2% annual risk of stroke. The patient's score is based upon: CHF History: 0 HTN History: 1 Diabetes History: 0 Stroke History: 0 Vascular Disease History: 0 Age Score: 1 Gender Score: 0   CrCl 96 (with adjusted body weight) Platelet count 258  Per office protocol, patient can hold Eliquis for 2 days prior to procedure.   Patient will not need bridging with Lovenox (enoxaparin) around procedure.  **This guidance is not considered finalized until pre-operative APP has relayed final recommendations.**

## 2022-06-30 ENCOUNTER — Ambulatory Visit: Payer: Medicare Other | Attending: Cardiology | Admitting: Cardiology

## 2022-06-30 ENCOUNTER — Encounter: Payer: Self-pay | Admitting: Cardiology

## 2022-06-30 VITALS — BP 122/76 | HR 88 | Ht 72.0 in | Wt 215.0 lb

## 2022-06-30 DIAGNOSIS — I10 Essential (primary) hypertension: Secondary | ICD-10-CM

## 2022-06-30 DIAGNOSIS — I4819 Other persistent atrial fibrillation: Secondary | ICD-10-CM | POA: Diagnosis not present

## 2022-06-30 DIAGNOSIS — I4892 Unspecified atrial flutter: Secondary | ICD-10-CM

## 2022-06-30 DIAGNOSIS — E782 Mixed hyperlipidemia: Secondary | ICD-10-CM | POA: Diagnosis not present

## 2022-06-30 MED ORDER — APIXABAN 5 MG PO TABS: 5.0000 mg | ORAL_TABLET | Freq: Two times a day (BID) | ORAL | 3 refills | Status: DC

## 2022-06-30 NOTE — Patient Instructions (Signed)
  Follow-Up: At [facility], you and your health needs are our priority.  As part of our continuing mission to provide you with exceptional heart care, we have created designated Provider Care Teams.  These Care Teams include your primary Cardiologist (physician) and Advanced Practice Providers (APPs -  Physician Assistants and Nurse Practitioners) who all work together to provide you with the care you need, when you need it.  We recommend signing up for the patient portal called "MyChart".  Sign up information is provided on this After Visit Summary.  MyChart is used to connect with patients for Virtual Visits (Telemedicine).  Patients are able to view lab/test results, encounter notes, upcoming appointments, etc.  Non-urgent messages can be sent to your provider as well.   To learn more about what you can do with MyChart, go to [email or web address].    Your next appointment:   3 month(s)  The format for your next appointment:   In Person  Provider:   Kirk Ruths MD

## 2022-06-30 NOTE — Progress Notes (Signed)
HPI: FU Atrial fibrillation/Atrial flutter. Abdominal ultrasound April 2019 showed no aneurysm.  Previously diagnosed with atrial flutter in December 2015.  He subsequently had atrial flutter ablation February 2016.  Patient previously developed COVID and developed recurrent atrial flutter.  Echocardiogram repeated September 2022 and showed normal LV function, grade 1 diastolic dysfunction.  He had atrial flutter ablation December 2022.  Since last seen he has dyspnea with more vigorous activities slightly worse compared to previous.  No orthopnea, PND, pedal edema, palpitations, syncope or chest pain.  Current Outpatient Medications  Medication Sig Dispense Refill   amLODipine (NORVASC) 10 MG tablet TAKE ONE-HALF TABLET BY  MOUTH TWICE DAILY 90 tablet 3   apixaban (ELIQUIS) 5 MG TABS tablet Take 1 tablet (5 mg total) by mouth 2 (two) times daily. 60 tablet 3   Brimonidine Tartrate (LUMIFY) 0.025 % SOLN Place 1 drop into both eyes daily.     Carboxymethylcellulose Sodium (LUBRICANT EYE DROPS OP) Place 1 drop into both eyes 2 (two) times daily as needed (Dry eye).     Cholecalciferol (VITAMIN D3 PO) Take 1 tablet by mouth daily.     Clobetasol Propionate 0.05 % shampoo Apply 1 application topically daily as needed (irritation).     fexofenadine (ALLEGRA) 180 MG tablet Take 1 tablet (180 mg total) by mouth daily. (Patient taking differently: Take 180 mg by mouth daily as needed for allergies.)     fluticasone (FLONASE) 50 MCG/ACT nasal spray Place 2 sprays into both nostrils daily as needed for allergies or rhinitis.  2   ketoconazole (NIZORAL) 2 % shampoo Apply 1 application topically daily as needed for irritation.     lisinopril-hydrochlorothiazide (ZESTORETIC) 20-12.5 MG tablet TAKE 1 TABLET BY MOUTH DAILY 90 tablet 3   metoprolol succinate (TOPROL-XL) 50 MG 24 hr tablet Take 1 tablet (50 mg total) by mouth 2 (two) times daily. Take with or immediately following a meal. 180 tablet 1    Multiple Vitamin (MULTIVITAMIN) tablet Take 1 tablet by mouth daily.     omeprazole (PRILOSEC) 20 MG capsule TAKE 1 CAPSULE BY MOUTH  DAILY 100 capsule 2   sildenafil (REVATIO) 20 MG tablet TAKE ONE TO FOUR TABLETS BY MOUTH DAILY AS NEEDED 90 tablet 1   simvastatin (ZOCOR) 20 MG tablet TAKE 1 TABLET BY MOUTH AT  BEDTIME 100 tablet 2   tamsulosin (FLOMAX) 0.4 MG CAPS capsule TAKE 1 CAPSULE BY MOUTH  DAILY 100 capsule 2   tiZANidine (ZANAFLEX) 2 MG tablet Take 0.5-2 tablets (1-4 mg total) by mouth 2 (two) times daily as needed for muscle spasms. 30 tablet 1   Ascorbic Acid (VITA-C PO) Take 1 tablet by mouth daily. (Patient not taking: Reported on 06/23/2022)     No current facility-administered medications for this visit.     Past Medical History:  Diagnosis Date   Allergic rhinitis    Ankle fracture 1998   Atrial flutter ([place])    a. s/p CTI ablation by Dr Rayann Heman 11/2014   Back pain 08/05/2015   BPH (benign prostatic hyperplasia) 08/26/2015   Ganglion cyst of dorsum of right wrist 04/08/2017   GERD (gastroesophageal reflux disease)    Hammer toe of right foot 05/06/2013   History of carpal tunnel syndrome    Hyperlipidemia    Hypertension    Insect bite 04/23/2015   OA (osteoarthritis) 02/06/2013   knees   Pedal edema 04/08/2017   Preventative health care 04/27/2011   Had normal colonoscopy in 2011  Past Surgical History:  Procedure Laterality Date   A-FLUTTER ABLATION N/A 09/12/2021   Procedure: A-FLUTTER ABLATION;  Surgeon: Thompson Grayer, MD;  Location: [facility];  Service: Cardiovascular;  Laterality: N/A;   ABLATION  11-24-14   CTI ablation by Dr Rayann Heman   ANKLE SURGERY     ATRIAL FLUTTER ABLATION N/A 11/24/2014   Procedure: ATRIAL FLUTTER ABLATION;  Surgeon: Thompson Grayer, MD;  Location: [facility] CATH LAB;  Service: Cardiovascular;  Laterality: N/A;   Carpel tunnel     COLONOSCOPY  08/04/2002   Normal Exam- Dr Fransico Meadow HERNIA REPAIR  2008   TONSILLECTOMY       Social History   Socioeconomic History   Marital status: Married    Spouse name: Not on file   Number of children: 1   Years of education: Not on file   Highest education level: Not on file  Occupational History    Comment: Sales  Tobacco Use   Smoking status: Former   Smokeless tobacco: Never   Tobacco comments:    5-10 pack year history  Vaping Use   Vaping Use: Never used  Substance and Sexual Activity   Alcohol use: Yes    Comment: 2-3 glasses of wine per day   Drug use: No   Sexual activity: Yes  Other Topics Concern   Not on file  Social History Narrative   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, seed, erosion control)   Married- 15 years   21 daughter -Paramedic in   ([place]   Previous smoker - 5-10 pack yrs     Alcohol use-yes (2-3 glasses of wine)     Social Determinants of Health   Financial Resource Strain: Low Risk  (10/01/2021)   Overall Financial Resource Strain (CARDIA)    Difficulty of Paying Living Expenses: Not hard at all  Food Insecurity: No Food Insecurity (10/01/2021)   Hunger Vital Sign    Worried About Running Out of Food in the Last Year: Never true    [place] in the Last Year: Never true  Transportation Needs: No Transportation Needs (10/01/2021)   PRAPARE - Hydrologist (Medical): No    Lack of Transportation (Non-Medical): No  Physical Activity: Not on file  Stress: No Stress Concern Present (10/01/2021)   [place]    Feeling of Stress : Not at all  Social Connections: [place] (10/01/2021)   Social Connection and Isolation Panel [NHANES]    Frequency of Communication with Friends and Family: More than three times a week    Frequency of Social Gatherings with Friends and Family: More than three times a week    Attends Religious Services: More than 4 times per year    Active Member of [employer] or  Organizations: Yes    Attends Music therapist: More than 4 times per year    Marital Status: Married  Human resources officer Violence: Not At Risk (10/01/2021)   Humiliation, Afraid, Rape, and Kick questionnaire    Fear of Current or Ex-Partner: No    Emotionally Abused: No    Physically Abused: No    Sexually Abused: No    Family History  Problem Relation Age of Onset   Arthritis Mother    Stroke Mother    Coronary artery disease Father        aortic valve disease   Heart attack Father 16  COPD Father    Hypertension Father    Hyperlipidemia Father    Dementia Father    Cancer Sister 7       breast cancer   Diabetes Sister    Diabetes type II Maternal Grandfather    Diabetes Maternal Grandfather    Hyperlipidemia Other    Hypertension Other     ROS: no fevers or chills, productive cough, hemoptysis, dysphasia, odynophagia, melena, hematochezia, dysuria, hematuria, rash, seizure activity, orthopnea, PND, pedal edema, claudication. Remaining systems are negative.  Physical Exam: Well-developed well-nourished in no acute distress.  Skin is warm and dry.  HEENT is normal.  Neck is supple.  Chest is clear to auscultation with normal expansion.  Cardiovascular exam is irregular Abdominal exam nontender or distended. No masses palpated. Extremities show no edema. neuro grossly intact  ECG-June 17, 2022-atrial fibrillation at a rate of 96 with no ST changes personally reviewed  A/P  1 history of atrial flutter-status post ablation.    2 persistent atrial fibrillation-continue Toprol for rate control.  Continue apixaban.  Recent TSH normal.  Previous echocardiogram showed normal LV function.  He notes mild increased dyspnea on exertion but no orthopnea, chest pain or palpitations.  I think he is mildly symptomatic.  It would be reasonable to try to reestablish sinus rhythm.  I will ask for him to be seen in the atrial fibrillation clinic (I think he will  require an antiarrhythmic versus ablation to maintain sinus rhythm given to previous atrial flutter ablations and now recurrent atrial fibrillation).  3 hypertension-blood pressure controlled.  Continue present medical regimen.  4 hyperlipidemia-continue statin.  5 request for preoperative evaluation prior to EGD/colonoscopy-patient may proceed with these procedures if needed.  We will stop Eliquis 2 days prior to procedure and resume after when okay with gastroenterology.  Kirk Ruths, MD

## 2022-07-03 ENCOUNTER — Encounter (HOSPITAL_COMMUNITY): Payer: Self-pay | Admitting: Nurse Practitioner

## 2022-07-03 ENCOUNTER — Ambulatory Visit (HOSPITAL_COMMUNITY)
Admission: RE | Admit: 2022-07-03 | Discharge: 2022-07-03 | Disposition: A | Discharge status: Home / Self Care | Payer: Medicare Other | Source: Ambulatory Visit | Attending: Nurse Practitioner | Admitting: Nurse Practitioner

## 2022-07-03 VITALS — BP 118/70 | HR 117 | Ht 72.0 in | Wt 213.0 lb

## 2022-07-03 DIAGNOSIS — I4892 Unspecified atrial flutter: Secondary | ICD-10-CM | POA: Insufficient documentation

## 2022-07-03 DIAGNOSIS — I4819 Other persistent atrial fibrillation: Secondary | ICD-10-CM | POA: Diagnosis not present

## 2022-07-03 DIAGNOSIS — I10 Essential (primary) hypertension: Secondary | ICD-10-CM | POA: Insufficient documentation

## 2022-07-03 DIAGNOSIS — I4891 Unspecified atrial fibrillation: Secondary | ICD-10-CM | POA: Insufficient documentation

## 2022-07-03 LAB — CBC
HCT: 46 % (ref 39.0–52.0)
Hemoglobin: 15.9 g/dL (ref 13.0–17.0)
MCH: 33.5 pg (ref 26.0–34.0)
MCHC: 34.6 g/dL (ref 30.0–36.0)
MCV: 97 fL (ref 80.0–100.0)
Platelets: 198 10*3/uL (ref 150–400)
RBC: 4.74 MIL/uL (ref 4.22–5.81)
RDW: 12.5 % (ref 11.5–15.5)
WBC: 8.2 10*3/uL (ref 4.0–10.5)
nRBC: 0 % (ref 0.0–0.2)

## 2022-07-03 LAB — BASIC METABOLIC PANEL
Anion gap: 11 (ref 5–15)
BUN: 15 mg/dL (ref 8–23)
CO2: 26 mmol/L (ref 22–32)
Calcium: 9.4 mg/dL (ref 8.9–10.3)
Chloride: 100 mmol/L (ref 98–111)
Creatinine, Ser: 1.12 mg/dL (ref 0.61–1.24)
GFR, Estimated: >60 mL/min (ref >60)
Glucose, Bld: 158 mg/dL — ABNORMAL HIGH (ref 70–99)
Potassium: 3.9 mmol/L (ref 3.5–5.1)
Sodium: 137 mmol/L (ref 135–145)

## 2022-07-03 NOTE — H&P (View-Only) (Signed)
Primary Care Physician: Mosie Lukes, MD Referring Physician:Dr. Rayann Heman   William Phillips is a 71 y.o. male with a h/o aflutter ablation x 2 with Dr. Rayann Heman, in 2016 and again in 09/2021. He was at her PCP's office recently and ekg showed afib. He was unaware but had noted some exertional dyspnea.   He was started on metoprolol and eliquis 5 mg bid for a CHA2DS2VASc  score of 2 by PCP 06/18/22, first full day of taking BID. He then saw Dr. Stanford Breed 9/18 and referred here. Recent normal TSH. He states at home that his HR's are in the 80'-90's at home.   Today, he denies symptoms of palpitations, chest pain, shortness of breath, orthopnea, PND, lower extremity edema, dizziness, presyncope, syncope, or neurologic sequela. The patient is tolerating medications without difficulties and is otherwise without complaint today.   Past Medical History:  Diagnosis Date   Allergic rhinitis    Ankle fracture 1998   Atrial flutter ([place])    a. s/p CTI ablation by Dr Rayann Heman 11/2014   Back pain 08/05/2015   BPH (benign prostatic hyperplasia) 08/26/2015   Ganglion cyst of dorsum of right wrist 04/08/2017   GERD (gastroesophageal reflux disease)    Hammer toe of right foot 05/06/2013   History of carpal tunnel syndrome    Hyperlipidemia    Hypertension    Insect bite 04/23/2015   OA (osteoarthritis) 02/06/2013   knees   Pedal edema 04/08/2017   Preventative health care 04/27/2011   Had normal colonoscopy in 2011    Past Surgical History:  Procedure Laterality Date   A-FLUTTER ABLATION N/A 09/12/2021   Procedure: A-FLUTTER ABLATION;  Surgeon: Thompson Grayer, MD;  Location: [facility];  Service: Cardiovascular;  Laterality: N/A;   ABLATION  11-24-14   CTI ablation by Dr Rayann Heman   ANKLE SURGERY     ATRIAL FLUTTER ABLATION N/A 11/24/2014   Procedure: ATRIAL FLUTTER ABLATION;  Surgeon: Thompson Grayer, MD;  Location: [facility] CATH LAB;  Service: Cardiovascular;  Laterality: N/A;   Carpel tunnel      COLONOSCOPY  08/04/2002   Normal Exam- Dr Fransico Meadow HERNIA REPAIR  2008   TONSILLECTOMY      Current Outpatient Medications  Medication Sig Dispense Refill   amLODipine (NORVASC) 10 MG tablet TAKE ONE-HALF TABLET BY  MOUTH TWICE DAILY 90 tablet 3   apixaban (ELIQUIS) 5 MG TABS tablet Take 1 tablet (5 mg total) by mouth 2 (two) times daily. 60 tablet 3   Ascorbic Acid (VITA-C PO) Take 1 tablet by mouth daily.     Brimonidine Tartrate (LUMIFY) 0.025 % SOLN Place 1 drop into both eyes daily.     Carboxymethylcellulose Sodium (LUBRICANT EYE DROPS OP) Place 1 drop into both eyes 2 (two) times daily as needed (Dry eye).     Cholecalciferol (VITAMIN D3 PO) Take 1 tablet by mouth daily.     Clobetasol Propionate 0.05 % shampoo Apply 1 application topically daily as needed (irritation).     fexofenadine (ALLEGRA) 180 MG tablet Take 1 tablet (180 mg total) by mouth daily. (Patient taking differently: Take 180 mg by mouth daily as needed for allergies.)     fluticasone (FLONASE) 50 MCG/ACT nasal spray Place 2 sprays into both nostrils daily as needed for allergies or rhinitis.  2   ketoconazole (NIZORAL) 2 % shampoo Apply 1 application topically daily as needed for irritation.     lisinopril-hydrochlorothiazide (ZESTORETIC) 20-12.5 MG tablet TAKE 1 TABLET  BY MOUTH DAILY 90 tablet 3   metoprolol succinate (TOPROL-XL) 50 MG 24 hr tablet Take 1 tablet (50 mg total) by mouth 2 (two) times daily. Take with or immediately following a meal. 180 tablet 1   Multiple Vitamin (MULTIVITAMIN) tablet Take 1 tablet by mouth daily.     omeprazole (PRILOSEC) 20 MG capsule TAKE 1 CAPSULE BY MOUTH  DAILY 100 capsule 2   sildenafil (REVATIO) 20 MG tablet TAKE ONE TO FOUR TABLETS BY MOUTH DAILY AS NEEDED 90 tablet 1   simvastatin (ZOCOR) 20 MG tablet TAKE 1 TABLET BY MOUTH AT  BEDTIME 100 tablet 2   tamsulosin (FLOMAX) 0.4 MG CAPS capsule TAKE 1 CAPSULE BY MOUTH  DAILY 100 capsule 2   tiZANidine (ZANAFLEX) 2 MG  tablet Take 0.5-2 tablets (1-4 mg total) by mouth 2 (two) times daily as needed for muscle spasms. 30 tablet 1   No current facility-administered medications for this encounter.    Allergies  Allergen Reactions   Penicillins     REACTION: swelling , irriation    Social History   Socioeconomic History   Marital status: Married    Spouse name: Not on file   Number of children: 1   Years of education: Not on file   Highest education level: Not on file  Occupational History    Comment: Sales  Tobacco Use   Smoking status: Former   Smokeless tobacco: Never   Tobacco comments:    5-10 pack year history  Vaping Use   Vaping Use: Never used  Substance and Sexual Activity   Alcohol use: Yes    Comment: 2-3 glasses of wine per day   Drug use: No   Sexual activity: Yes  Other Topics Concern   Not on file  Social History Narrative   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, seed, erosion control)   Married- 27 years   21 daughter -Paramedic in   ([place]   Previous smoker - 5-10 pack yrs     Alcohol use-yes (2-3 glasses of wine)     Social Determinants of Health   Financial Resource Strain: Low Risk  (10/01/2021)   Overall Financial Resource Strain (CARDIA)    Difficulty of Paying Living Expenses: Not hard at all  Food Insecurity: No Food Insecurity (10/01/2021)   Hunger Vital Sign    Worried About Running Out of Food in the Last Year: Never true    [place] in the Last Year: Never true  Transportation Needs: No Transportation Needs (10/01/2021)   PRAPARE - Hydrologist (Medical): No    Lack of Transportation (Non-Medical): No  Physical Activity: Not on file  Stress: No Stress Concern Present (10/01/2021)   [place]    Feeling of Stress : Not at all  Social Connections: [place] (10/01/2021)   Social Connection and Isolation Panel [NHANES]     Frequency of Communication with Friends and Family: More than three times a week    Frequency of Social Gatherings with Friends and Family: More than three times a week    Attends Religious Services: More than 4 times per year    Active Member of [employer] or Organizations: Yes    Attends Archivist Meetings: More than 4 times per year    Marital Status: Married  Human resources officer Violence: Not At Risk (10/01/2021)   Humiliation, Afraid, Rape, and Kick questionnaire    Fear  of Current or Ex-Partner: No    Emotionally Abused: No    Physically Abused: No    Sexually Abused: No    Family History  Problem Relation Age of Onset   Arthritis Mother    Stroke Mother    Coronary artery disease Father        aortic valve disease   Heart attack Father 15   COPD Father    Hypertension Father    Hyperlipidemia Father    Dementia Father    Cancer Sister 43       breast cancer   Diabetes Sister    Diabetes type II Maternal Grandfather    Diabetes Maternal Grandfather    Hyperlipidemia Other    Hypertension Other     ROS- All systems are reviewed and negative except as per the HPI above  Physical Exam: Vitals:   07/03/22 1452  BP: 118/70  Pulse: (!) 117  Weight: 96.6 kg  Height: 6' (1.829 m)   Wt Readings from Last 3 Encounters:  07/03/22 96.6 kg  06/30/22 97.5 kg  06/23/22 96.5 kg    Labs: Lab Results  Component Value Date   NA 141 06/17/2022   K 4.1 06/17/2022   CL 99 06/17/2022   CO2 29 06/17/2022   GLUCOSE 137 (H) 06/17/2022   BUN 12 06/17/2022   CREATININE 0.86 06/17/2022   CALCIUM 9.7 06/17/2022   PHOS 3.2 06/20/2014   MG 2.1 06/02/2019   No results found for: "INR" Lab Results  Component Value Date   CHOL 170 06/17/2022   HDL 50.30 06/17/2022   LDLCALC 83 06/17/2022   TRIG 183.0 (H) 06/17/2022     GEN- The patient is well appearing, alert and oriented x 3 today.   Head- normocephalic, atraumatic Eyes-  Sclera clear, conjunctiva pink Ears-  hearing intact Oropharynx- clear Neck- supple, no JVP Lymph- no cervical lymphadenopathy Lungs- Clear to ausculation bilaterally, normal work of breathing Heart- irregular rate and rhythm, no murmurs, rubs or gallops, PMI not laterally displaced GI- soft, NT, ND, + BS Extremities- no clubbing, cyanosis, or edema MS- no significant deformity or atrophy Skin- no rash or lesion Psych- euthymic mood, full affect Neuro- strength and sensation are intact  EKG-Vent. rate 117 BPM PR interval * ms QRS duration 82 ms QT/QTcB 298/415 ms P-R-T axes * 79 33 Atrial fibrillation with rapid ventricular response Abnormal ECG When compared with ECG of 12-Sep-2021 13:16, PREVIOUS ECG IS PRESENT    Assessment and Plan:   1. New onset afib Atrial flutter ablation x 2 in the past He is mildly symptomatic Continue metoprolol succinate 50 mg bid for rate control  I discussed referring to another EP in Dr. Jackalyn Lombard absence to discuss an afib ablation. But since these procedures are scheduled out thru the end of the year, will go ahead and cardiovert pt while he awaits for an EP appointment.  Cardioversion risk vrs benefit discussed with pt and he agrees to pursue  Will be 21 days from  start of eliquis 5 mg bid so scheduled for Monday October 2nd Cbc/bmet  2. CHA2DS2VASc  score of 2 Continue eliquis 5 mg bid without missed doses  If a dose is missed call the afib clinic  3. HTN Stable   F/u in afib clinic in one week after cardioversion  Butch Penny C. Seona Clemenson, [facility] [address] [place], [place] [address] [phone]

## 2022-07-03 NOTE — Patient Instructions (Signed)
Cardioversion scheduled for Monday, October 2nd  - Arrive at the [facility] and go to admitting at [facility] not eat or drink anything after midnight the night prior to your procedure.  - Take all your morning medication (except diabetic medications) with a sip of water prior to arrival.  - You will not be able to drive home after your procedure.  - Do NOT miss any doses of your blood thinner - if you should miss a dose please notify our office immediately.  - If you feel as if you go back into normal rhythm prior to scheduled cardioversion, please notify our office immediately. If your procedure is canceled in the cardioversion suite you will be charged a cancellation fee.

## 2022-07-03 NOTE — Progress Notes (Signed)
Primary Care Physician: Mosie Lukes, MD Referring Physician:Dr. Rayann Heman   William Phillips is a 71 y.o. male with a h/o aflutter ablation x 2 with Dr. Rayann Heman, in 2016 and again in 09/2021. He was at her PCP's office recently and ekg showed afib. He was unaware but had noted some exertional dyspnea.   He was started on metoprolol and eliquis 5 mg bid for a CHA2DS2VASc  score of 2 by PCP 06/18/22, first full day of taking BID. He then saw Dr. Stanford Breed 9/18 and referred here. Recent normal TSH. He states at home that his HR's are in the 80'-90's at home.   Today, he denies symptoms of palpitations, chest pain, shortness of breath, orthopnea, PND, lower extremity edema, dizziness, presyncope, syncope, or neurologic sequela. The patient is tolerating medications without difficulties and is otherwise without complaint today.   Past Medical History:  Diagnosis Date   Allergic rhinitis    Ankle fracture 1998   Atrial flutter ([place])    a. s/p CTI ablation by Dr Rayann Heman 11/2014   Back pain 08/05/2015   BPH (benign prostatic hyperplasia) 08/26/2015   Ganglion cyst of dorsum of right wrist 04/08/2017   GERD (gastroesophageal reflux disease)    Hammer toe of right foot 05/06/2013   History of carpal tunnel syndrome    Hyperlipidemia    Hypertension    Insect bite 04/23/2015   OA (osteoarthritis) 02/06/2013   knees   Pedal edema 04/08/2017   Preventative health care 04/27/2011   Had normal colonoscopy in 2011    Past Surgical History:  Procedure Laterality Date   A-FLUTTER ABLATION N/A 09/12/2021   Procedure: A-FLUTTER ABLATION;  Surgeon: Thompson Grayer, MD;  Location: [facility];  Service: Cardiovascular;  Laterality: N/A;   ABLATION  11-24-14   CTI ablation by Dr Rayann Heman   ANKLE SURGERY     ATRIAL FLUTTER ABLATION N/A 11/24/2014   Procedure: ATRIAL FLUTTER ABLATION;  Surgeon: Thompson Grayer, MD;  Location: [facility] CATH LAB;  Service: Cardiovascular;  Laterality: N/A;   Carpel tunnel      COLONOSCOPY  08/04/2002   Normal Exam- Dr Fransico Meadow HERNIA REPAIR  2008   TONSILLECTOMY      Current Outpatient Medications  Medication Sig Dispense Refill   amLODipine (NORVASC) 10 MG tablet TAKE ONE-HALF TABLET BY  MOUTH TWICE DAILY 90 tablet 3   apixaban (ELIQUIS) 5 MG TABS tablet Take 1 tablet (5 mg total) by mouth 2 (two) times daily. 60 tablet 3   Ascorbic Acid (VITA-C PO) Take 1 tablet by mouth daily.     Brimonidine Tartrate (LUMIFY) 0.025 % SOLN Place 1 drop into both eyes daily.     Carboxymethylcellulose Sodium (LUBRICANT EYE DROPS OP) Place 1 drop into both eyes 2 (two) times daily as needed (Dry eye).     Cholecalciferol (VITAMIN D3 PO) Take 1 tablet by mouth daily.     Clobetasol Propionate 0.05 % shampoo Apply 1 application topically daily as needed (irritation).     fexofenadine (ALLEGRA) 180 MG tablet Take 1 tablet (180 mg total) by mouth daily. (Patient taking differently: Take 180 mg by mouth daily as needed for allergies.)     fluticasone (FLONASE) 50 MCG/ACT nasal spray Place 2 sprays into both nostrils daily as needed for allergies or rhinitis.  2   ketoconazole (NIZORAL) 2 % shampoo Apply 1 application topically daily as needed for irritation.     lisinopril-hydrochlorothiazide (ZESTORETIC) 20-12.5 MG tablet TAKE 1 TABLET  BY MOUTH DAILY 90 tablet 3   metoprolol succinate (TOPROL-XL) 50 MG 24 hr tablet Take 1 tablet (50 mg total) by mouth 2 (two) times daily. Take with or immediately following a meal. 180 tablet 1   Multiple Vitamin (MULTIVITAMIN) tablet Take 1 tablet by mouth daily.     omeprazole (PRILOSEC) 20 MG capsule TAKE 1 CAPSULE BY MOUTH  DAILY 100 capsule 2   sildenafil (REVATIO) 20 MG tablet TAKE ONE TO FOUR TABLETS BY MOUTH DAILY AS NEEDED 90 tablet 1   simvastatin (ZOCOR) 20 MG tablet TAKE 1 TABLET BY MOUTH AT  BEDTIME 100 tablet 2   tamsulosin (FLOMAX) 0.4 MG CAPS capsule TAKE 1 CAPSULE BY MOUTH  DAILY 100 capsule 2   tiZANidine (ZANAFLEX) 2 MG  tablet Take 0.5-2 tablets (1-4 mg total) by mouth 2 (two) times daily as needed for muscle spasms. 30 tablet 1   No current facility-administered medications for this encounter.    Allergies  Allergen Reactions   Penicillins     REACTION: swelling , irriation    Social History   Socioeconomic History   Marital status: Married    Spouse name: Not on file   Number of children: 1   Years of education: Not on file   Highest education level: Not on file  Occupational History    Comment: Sales  Tobacco Use   Smoking status: Former   Smokeless tobacco: Never   Tobacco comments:    5-10 pack year history  Vaping Use   Vaping Use: Never used  Substance and Sexual Activity   Alcohol use: Yes    Comment: 2-3 glasses of wine per day   Drug use: No   Sexual activity: Yes  Other Topics Concern   Not on file  Social History Narrative   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, seed, erosion control)   Married- 32 years   21 daughter -Paramedic in   ([place]   Previous smoker - 5-10 pack yrs     Alcohol use-yes (2-3 glasses of wine)     Social Determinants of Health   Financial Resource Strain: Low Risk  (10/01/2021)   Overall Financial Resource Strain (CARDIA)    Difficulty of Paying Living Expenses: Not hard at all  Food Insecurity: No Food Insecurity (10/01/2021)   Hunger Vital Sign    Worried About Running Out of Food in the Last Year: Never true    [place] in the Last Year: Never true  Transportation Needs: No Transportation Needs (10/01/2021)   PRAPARE - Hydrologist (Medical): No    Lack of Transportation (Non-Medical): No  Physical Activity: Not on file  Stress: No Stress Concern Present (10/01/2021)   [place]    Feeling of Stress : Not at all  Social Connections: [place] (10/01/2021)   Social Connection and Isolation Panel [NHANES]     Frequency of Communication with Friends and Family: More than three times a week    Frequency of Social Gatherings with Friends and Family: More than three times a week    Attends Religious Services: More than 4 times per year    Active Member of [employer] or Organizations: Yes    Attends Archivist Meetings: More than 4 times per year    Marital Status: Married  Human resources officer Violence: Not At Risk (10/01/2021)   Humiliation, Afraid, Rape, and Kick questionnaire    Fear  of Current or Ex-Partner: No    Emotionally Abused: No    Physically Abused: No    Sexually Abused: No    Family History  Problem Relation Age of Onset   Arthritis Mother    Stroke Mother    Coronary artery disease Father        aortic valve disease   Heart attack Father 65   COPD Father    Hypertension Father    Hyperlipidemia Father    Dementia Father    Cancer Sister 35       breast cancer   Diabetes Sister    Diabetes type II Maternal Grandfather    Diabetes Maternal Grandfather    Hyperlipidemia Other    Hypertension Other     ROS- All systems are reviewed and negative except as per the HPI above  Physical Exam: Vitals:   07/03/22 1452  BP: 118/70  Pulse: (!) 117  Weight: 96.6 kg  Height: 6' (1.829 m)   Wt Readings from Last 3 Encounters:  07/03/22 96.6 kg  06/30/22 97.5 kg  06/23/22 96.5 kg    Labs: Lab Results  Component Value Date   NA 141 06/17/2022   K 4.1 06/17/2022   CL 99 06/17/2022   CO2 29 06/17/2022   GLUCOSE 137 (H) 06/17/2022   BUN 12 06/17/2022   CREATININE 0.86 06/17/2022   CALCIUM 9.7 06/17/2022   PHOS 3.2 06/20/2014   MG 2.1 06/02/2019   No results found for: "INR" Lab Results  Component Value Date   CHOL 170 06/17/2022   HDL 50.30 06/17/2022   LDLCALC 83 06/17/2022   TRIG 183.0 (H) 06/17/2022     GEN- The patient is well appearing, alert and oriented x 3 today.   Head- normocephalic, atraumatic Eyes-  Sclera clear, conjunctiva pink Ears-  hearing intact Oropharynx- clear Neck- supple, no JVP Lymph- no cervical lymphadenopathy Lungs- Clear to ausculation bilaterally, normal work of breathing Heart- irregular rate and rhythm, no murmurs, rubs or gallops, PMI not laterally displaced GI- soft, NT, ND, + BS Extremities- no clubbing, cyanosis, or edema MS- no significant deformity or atrophy Skin- no rash or lesion Psych- euthymic mood, full affect Neuro- strength and sensation are intact  EKG-Vent. rate 117 BPM PR interval * ms QRS duration 82 ms QT/QTcB 298/415 ms P-R-T axes * 79 33 Atrial fibrillation with rapid ventricular response Abnormal ECG When compared with ECG of 12-Sep-2021 13:16, PREVIOUS ECG IS PRESENT    Assessment and Plan:   1. New onset afib Atrial flutter ablation x 2 in the past He is mildly symptomatic Continue metoprolol succinate 50 mg bid for rate control  I discussed referring to another EP in Dr. Jackalyn Lombard absence to discuss an afib ablation. But since these procedures are scheduled out thru the end of the year, will go ahead and cardiovert pt while he awaits for an EP appointment.  Cardioversion risk vrs benefit discussed with pt and he agrees to pursue  Will be 21 days from  start of eliquis 5 mg bid so scheduled for Monday October 2nd Cbc/bmet  2. CHA2DS2VASc  score of 2 Continue eliquis 5 mg bid without missed doses  If a dose is missed call the afib clinic  3. HTN Stable   F/u in afib clinic in one week after cardioversion  Butch Penny C. Asal Teas, [facility] [address] [place], [place] [address] [phone]

## 2022-07-14 ENCOUNTER — Encounter (HOSPITAL_COMMUNITY)
Admission: RE | Disposition: A | Discharge status: Home / Self Care | Payer: Self-pay | Source: Home / Self Care | Attending: Internal Medicine

## 2022-07-14 ENCOUNTER — Encounter (HOSPITAL_COMMUNITY): Payer: Self-pay | Admitting: Internal Medicine

## 2022-07-14 ENCOUNTER — Other Ambulatory Visit: Payer: Self-pay

## 2022-07-14 ENCOUNTER — Ambulatory Visit (HOSPITAL_BASED_OUTPATIENT_CLINIC_OR_DEPARTMENT_OTHER): Payer: Medicare Other | Admitting: Anesthesiology

## 2022-07-14 ENCOUNTER — Ambulatory Visit (HOSPITAL_COMMUNITY)
Admission: RE | Admit: 2022-07-14 | Discharge: 2022-07-14 | Disposition: A | Discharge status: Home / Self Care | Payer: Medicare Other | Attending: Internal Medicine | Admitting: Internal Medicine

## 2022-07-14 ENCOUNTER — Ambulatory Visit (HOSPITAL_COMMUNITY): Payer: Medicare Other | Admitting: Anesthesiology

## 2022-07-14 DIAGNOSIS — Z87891 Personal history of nicotine dependence: Secondary | ICD-10-CM | POA: Insufficient documentation

## 2022-07-14 DIAGNOSIS — I471 Supraventricular tachycardia, unspecified: Secondary | ICD-10-CM | POA: Diagnosis not present

## 2022-07-14 DIAGNOSIS — I4819 Other persistent atrial fibrillation: Secondary | ICD-10-CM

## 2022-07-14 DIAGNOSIS — I472 Ventricular tachycardia, unspecified: Secondary | ICD-10-CM | POA: Insufficient documentation

## 2022-07-14 DIAGNOSIS — I4891 Unspecified atrial fibrillation: Secondary | ICD-10-CM

## 2022-07-14 DIAGNOSIS — M1990 Unspecified osteoarthritis, unspecified site: Secondary | ICD-10-CM

## 2022-07-14 DIAGNOSIS — K219 Gastro-esophageal reflux disease without esophagitis: Secondary | ICD-10-CM | POA: Insufficient documentation

## 2022-07-14 DIAGNOSIS — I10 Essential (primary) hypertension: Secondary | ICD-10-CM | POA: Diagnosis not present

## 2022-07-14 HISTORY — PX: CARDIOVERSION: SHX1299

## 2022-07-14 SURGERY — CARDIOVERSION
Anesthesia: General

## 2022-07-14 MED ORDER — PROPOFOL 10 MG/ML IV BOLUS
INTRAVENOUS | Status: DC | PRN
  Administered 2022-07-14: 20 mg via INTRAVENOUS
  Administered 2022-07-14: 60 mg via INTRAVENOUS
  Administered 2022-07-14: 20 mg via INTRAVENOUS

## 2022-07-14 MED ORDER — LIDOCAINE 2% (20 MG/ML) 5 ML SYRINGE
INTRAMUSCULAR | Status: DC | PRN
  Administered 2022-07-14: 60 mg via INTRAVENOUS

## 2022-07-14 MED ORDER — SODIUM CHLORIDE 0.9 % IV SOLN
INTRAVENOUS | Status: DC
  Administered 2022-07-14: 500 mL via INTRAVENOUS

## 2022-07-14 NOTE — Interval H&P Note (Signed)
History and Physical Interval Note:  07/14/2022 10:36 AM  William Phillips  has presented today for surgery, with the diagnosis of AFIB.  The various methods of treatment have been discussed with the patient and family. After consideration of risks, benefits and other options for treatment, the patient has consented to  Procedure(s): CARDIOVERSION (N/A) as a surgical intervention.  The patient's history has been reviewed, patient examined, no change in status, stable for surgery.  I have reviewed the patient's chart and labs.  Questions were answered to the patient's satisfaction.     Pixie Casino

## 2022-07-14 NOTE — Transfer of Care (Signed)
Immediate Anesthesia Transfer of Care Note  Patient: William Phillips  Procedure(s) Performed: CARDIOVERSION  Patient Location: Short Stay  Anesthesia Type:MAC  Level of Consciousness: awake  Airway & Oxygen Therapy: Patient Spontanous Breathing  Post-op Assessment: Report given to RN and Post -op Vital signs reviewed and stable  Post vital signs: Reviewed and stable  Last Vitals:  Vitals Value Taken Time  BP 113/70   Temp    Pulse 63   Resp 15   SpO2 99     Last Pain:  Vitals:   07/14/22 1015  TempSrc: Temporal  PainSc: 0-No pain         Complications: No notable events documented.

## 2022-07-14 NOTE — Anesthesia Preprocedure Evaluation (Addendum)
Anesthesia Evaluation  Patient identified by MRN, date of birth, ID band Patient awake    Reviewed: Allergy & Precautions, NPO status , Patient's Chart, lab work & pertinent test results, reviewed documented beta blocker date and time   Airway Mallampati: II  TM Distance: >3 FB Neck ROM: Full    Dental no notable dental hx. (+) Teeth Intact, Dental Advisory Given   Pulmonary neg pulmonary ROS, former smoker,    Pulmonary exam normal breath sounds clear to auscultation       Cardiovascular hypertension, Pt. on medications and Pt. on home beta blockers Normal cardiovascular exam+ dysrhythmias (on eliquis) Atrial Fibrillation and Supra Ventricular Tachycardia  Rhythm:Irregular Rate:Normal     Neuro/Psych negative neurological ROS  negative psych ROS   GI/Hepatic Neg liver ROS, GERD  ,  Endo/Other  negative endocrine ROS  Renal/GU negative Renal ROS  negative genitourinary   Musculoskeletal  (+) Arthritis ,   Abdominal   Peds  Hematology negative hematology ROS (+)   Anesthesia Other Findings   Reproductive/Obstetrics                            Anesthesia Physical Anesthesia Plan  ASA: 3  Anesthesia Plan: General   Post-op Pain Management:    Induction: Intravenous  PONV Risk Score and Plan: Propofol infusion and Treatment may vary due to age or medical condition  Airway Management Planned: Natural Airway  Additional Equipment:   Intra-op Plan:   Post-operative Plan:   Informed Consent: I have reviewed the patients History and Physical, chart, labs and discussed the procedure including the risks, benefits and alternatives for the proposed anesthesia with the patient or authorized representative who has indicated his/her understanding and acceptance.     Dental advisory given  Plan Discussed with: CRNA  Anesthesia Plan Comments:         Anesthesia Quick Evaluation

## 2022-07-14 NOTE — CV Procedure (Signed)
    CARDIOVERSION NOTE  Procedure: Electrical Cardioversion Indications:  Atrial Fibrillation  Procedure Details:  Consent: Risks of procedure as well as the alternatives and risks of each were explained to the (patient/caregiver).  Consent for procedure obtained.  Time Out: Verified patient identification, verified procedure, site/side was marked, verified correct patient position, special equipment/implants available, medications/allergies/relevent history reviewed, required imaging and test results available.  Performed  Patient placed on cardiac monitor, pulse oximetry, supplemental oxygen as necessary.  Sedation given:  propofol per anesthesia Pacer pads placed anterior and posterior chest.  Cardioverted 1 time(s).  Cardioverted at 200J biphasic.  Impression: Findings: Post procedure EKG shows: NSR Complications: None Patient did tolerate procedure well.  Plan: Successful DCCV with a single 200J biphasic shock.  Time Spent Directly with the Patient:  30 minutes   Pixie Casino, MD, [facility], [place] Director of the Advanced Lipid Disorders &  Cardiovascular Risk Reduction Clinic Diplomate of the American Board of Clinical Lipidology Attending Cardiologist  Direct Dial: [phone]  Fax: [phone]  Website:  [email or web address] 07/14/2022, 10:55 AM

## 2022-07-14 NOTE — Anesthesia Postprocedure Evaluation (Signed)
Anesthesia Post Note  Patient: William Phillips  Procedure(s) Performed: CARDIOVERSION     Patient location during evaluation: Endoscopy Anesthesia Type: General Level of consciousness: awake and alert Pain management: pain level controlled Vital Signs Assessment: post-procedure vital signs reviewed and stable Respiratory status: spontaneous breathing, nonlabored ventilation, respiratory function stable and patient connected to nasal cannula oxygen Cardiovascular status: blood pressure returned to baseline and stable Postop Assessment: no apparent nausea or vomiting Anesthetic complications: no   No notable events documented.  Last Vitals:  Vitals:   07/14/22 1110 07/14/22 1115  BP: 123/76 132/86  Pulse: 61 60  Resp: 19 18  Temp:    SpO2: 97% 97%    Last Pain:  Vitals:   07/14/22 1110  TempSrc:   PainSc: 0-No pain                 Kohler Pellerito L Gerson Fauth

## 2022-07-14 NOTE — Anesthesia Procedure Notes (Signed)
Procedure Name: General with mask airway Date/Time: 07/14/2022 10:44 AM  Performed by: Erick Colace, CRNAPre-anesthesia Checklist: Patient identified, Emergency Drugs available, Suction available and Patient being monitored Patient Re-evaluated:Patient Re-evaluated prior to induction Oxygen Delivery Method: Ambu bag Preoxygenation: Pre-oxygenation with 100% oxygen Induction Type: IV induction Dental Injury: Teeth and Oropharynx as per pre-operative assessment

## 2022-07-20 ENCOUNTER — Encounter (HOSPITAL_COMMUNITY): Payer: Self-pay | Admitting: Internal Medicine

## 2022-07-24 ENCOUNTER — Ambulatory Visit (HOSPITAL_COMMUNITY): Payer: Medicare Other | Admitting: Nurse Practitioner

## 2022-07-27 ENCOUNTER — Other Ambulatory Visit: Payer: Self-pay | Admitting: Family Medicine

## 2022-07-29 ENCOUNTER — Ambulatory Visit (HOSPITAL_COMMUNITY)
Admission: RE | Admit: 2022-07-29 | Discharge: 2022-07-29 | Disposition: A | Discharge status: Home / Self Care | Payer: Medicare Other | Source: Ambulatory Visit | Attending: Nurse Practitioner | Admitting: Nurse Practitioner

## 2022-07-29 VITALS — BP 94/60 | HR 80 | Ht 72.0 in | Wt 218.4 lb

## 2022-07-29 DIAGNOSIS — Z7901 Long term (current) use of anticoagulants: Secondary | ICD-10-CM | POA: Insufficient documentation

## 2022-07-29 DIAGNOSIS — I4891 Unspecified atrial fibrillation: Secondary | ICD-10-CM | POA: Diagnosis not present

## 2022-07-29 DIAGNOSIS — I10 Essential (primary) hypertension: Secondary | ICD-10-CM | POA: Insufficient documentation

## 2022-07-29 DIAGNOSIS — D6869 Other thrombophilia: Secondary | ICD-10-CM | POA: Diagnosis not present

## 2022-07-29 DIAGNOSIS — I4819 Other persistent atrial fibrillation: Secondary | ICD-10-CM | POA: Diagnosis not present

## 2022-07-29 NOTE — Progress Notes (Addendum)
Primary Care Physician: Mosie Lukes, MD Referring Physician:Dr. Rayann Heman, pending establishing with Dr. Evalina Field is a 71 y.o. male with a h/o aflutter ablation x 2 with Dr. Rayann Heman, in 2016 and again in 09/2021. He was at his PCP's office recently and ekg showed afib. He was unaware but had noted some exertional dyspnea.  He was started on metoprolol and eliquis 5 mg bid for a CHA2DS2VASc  score of 2 by PCP 06/18/22, first full day of taking BID. He then saw Dr. Stanford Breed 9/18 and referred here. Recent normal TSH. He states at home that his HR's are in the 80'-90's at home.   F/u successful  cardioversion, 07/29/22. Unfortunately, ekg today shows afib rate controlled. He was unaware, states he feels fine. He does have an appointment with Dr. Myles Gip 10/24 at which time he can discuss afib ablation.   Today, he denies symptoms of palpitations, chest pain, shortness of breath, orthopnea, PND, lower extremity edema, dizziness, presyncope, syncope, or neurologic sequela. The patient is tolerating medications without difficulties and is otherwise without complaint today.   Past Medical History:  Diagnosis Date   Allergic rhinitis    Ankle fracture 1998   Atrial flutter ([place])    a. s/p CTI ablation by Dr Rayann Heman 11/2014   Back pain 08/05/2015   BPH (benign prostatic hyperplasia) 08/26/2015   Ganglion cyst of dorsum of right wrist 04/08/2017   GERD (gastroesophageal reflux disease)    Hammer toe of right foot 05/06/2013   History of carpal tunnel syndrome    Hyperlipidemia    Hypertension    Insect bite 04/23/2015   OA (osteoarthritis) 02/06/2013   knees   Pedal edema 04/08/2017   Preventative health care 04/27/2011   Had normal colonoscopy in 2011    Past Surgical History:  Procedure Laterality Date   A-FLUTTER ABLATION N/A 09/12/2021   Procedure: A-FLUTTER ABLATION;  Surgeon: Thompson Grayer, MD;  Location: [facility];  Service: Cardiovascular;  Laterality: N/A;    ABLATION  11-24-14   CTI ablation by Dr Rayann Heman   ANKLE SURGERY     ATRIAL FLUTTER ABLATION N/A 11/24/2014   Procedure: ATRIAL FLUTTER ABLATION;  Surgeon: Thompson Grayer, MD;  Location: [facility] CATH LAB;  Service: Cardiovascular;  Laterality: N/A;   CARDIOVERSION N/A 07/14/2022   Procedure: CARDIOVERSION;  Surgeon: Pixie Casino, MD;  Location: [facility] ENDOSCOPY;  Service: Cardiovascular;  Laterality: N/A;   Carpel tunnel     COLONOSCOPY  08/04/2002   Normal Exam- Dr Fransico Meadow HERNIA REPAIR  2008   TONSILLECTOMY      Current Outpatient Medications  Medication Sig Dispense Refill   amLODipine (NORVASC) 10 MG tablet TAKE ONE-HALF TABLET BY MOUTH  TWICE DAILY 100 tablet 2   apixaban (ELIQUIS) 5 MG TABS tablet Take 1 tablet (5 mg total) by mouth 2 (two) times daily. 60 tablet 3   Ascorbic Acid (VITAMIN C PO) Take 1 tablet by mouth daily.     Brimonidine Tartrate (LUMIFY) 0.025 % SOLN Place 1 drop into both eyes every morning.     Carboxymethylcellulose Sodium (LUBRICANT EYE DROPS OP) Place 1 drop into both eyes 2 (two) times daily as needed (Dry eye).     Cholecalciferol (VITAMIN D3) 50 MCG (2000 UT) TABS Take 2,000 Units by mouth daily.     Clobetasol Propionate 0.05 % shampoo Apply 1 application topically daily as needed (irritation).     fexofenadine (ALLEGRA) 180 MG tablet Take  1 tablet (180 mg total) by mouth daily. (Patient taking differently: Take 180 mg by mouth daily as needed for allergies.)     fluticasone (FLONASE) 50 MCG/ACT nasal spray Place 2 sprays into both nostrils daily as needed for allergies or rhinitis. (Patient taking differently: Place 1 spray into both nostrils daily as needed for allergies or rhinitis.)  2   ketoconazole (NIZORAL) 2 % shampoo Apply 1 application topically daily as needed for irritation.     lisinopril-hydrochlorothiazide (ZESTORETIC) 20-12.5 MG tablet TAKE 1 TABLET BY MOUTH DAILY 90 tablet 3   metoprolol succinate (TOPROL-XL) 50 MG 24 hr tablet Take 1  tablet (50 mg total) by mouth 2 (two) times daily. Take with or immediately following a meal. 180 tablet 1   Multiple Vitamin (MULTIVITAMIN) tablet Take 1 tablet by mouth every evening.     omeprazole (PRILOSEC) 20 MG capsule TAKE 1 CAPSULE BY MOUTH  DAILY 100 capsule 2   sildenafil (REVATIO) 20 MG tablet TAKE ONE TO FOUR TABLETS BY MOUTH DAILY AS NEEDED 90 tablet 1   simvastatin (ZOCOR) 20 MG tablet TAKE 1 TABLET BY MOUTH AT  BEDTIME (Patient taking differently: Take 20 mg by mouth every morning.) 100 tablet 2   tamsulosin (FLOMAX) 0.4 MG CAPS capsule TAKE 1 CAPSULE BY MOUTH  DAILY (Patient taking differently: Take 0.4 mg by mouth daily after supper.) 100 capsule 2   tiZANidine (ZANAFLEX) 2 MG tablet Take 0.5-2 tablets (1-4 mg total) by mouth 2 (two) times daily as needed for muscle spasms. 30 tablet 1   No current facility-administered medications for this encounter.    Allergies  Allergen Reactions   Penicillins     REACTION: swelling , irriation    Social History   Socioeconomic History   Marital status: Married    Spouse name: Not on file   Number of children: 1   Years of education: Not on file   Highest education level: Not on file  Occupational History    Comment: Sales  Tobacco Use   Smoking status: Former   Smokeless tobacco: Never   Tobacco comments:    5-10 pack year history  Vaping Use   Vaping Use: Never used  Substance and Sexual Activity   Alcohol use: Yes    Comment: 2-3 glasses of wine per day   Drug use: No   Sexual activity: Yes  Other Topics Concern   Not on file  Social History Narrative   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, seed, erosion control)   Married- 33 years   21 daughter -Paramedic in   ([place]   Previous smoker - 5-10 pack yrs     Alcohol use-yes (2-3 glasses of wine)     Social Determinants of Health   Financial Resource Strain: Low Risk  (10/01/2021)   Overall Financial Resource Strain (CARDIA)    Difficulty  of Paying Living Expenses: Not hard at all  Food Insecurity: No Food Insecurity (10/01/2021)   Hunger Vital Sign    Worried About Running Out of Food in the Last Year: Never true    [place] in the Last Year: Never true  Transportation Needs: No Transportation Needs (10/01/2021)   PRAPARE - Hydrologist (Medical): No    Lack of Transportation (Non-Medical): No  Physical Activity: Not on file  Stress: No Stress Concern Present (10/01/2021)   [place]    Feeling of Stress :  Not at all  Social Connections: Socially Integrated (10/01/2021)   Social Connection and Isolation Panel [NHANES]    Frequency of Communication with Friends and Family: More than three times a week    Frequency of Social Gatherings with Friends and Family: More than three times a week    Attends Religious Services: More than 4 times per year    Active Member of Clubs or Organizations: Yes    Attends Archivist Meetings: More than 4 times per year    Marital Status: Married  Human resources officer Violence: Not At Risk (10/01/2021)   Humiliation, Afraid, Rape, and Kick questionnaire    Fear of Current or Ex-Partner: No    Emotionally Abused: No    Physically Abused: No    Sexually Abused: No    Family History  Problem Relation Age of Onset   Arthritis Mother    Stroke Mother    Coronary artery disease Father        aortic valve disease   Heart attack Father 70   COPD Father    Hypertension Father    Hyperlipidemia Father    Dementia Father    Cancer Sister 54       breast cancer   Diabetes Sister    Diabetes type II Maternal Grandfather    Diabetes Maternal Grandfather    Hyperlipidemia Other    Hypertension Other     ROS- All systems are reviewed and negative except as per the HPI above  Physical Exam: Vitals:   07/29/22 0943  BP: 94/60  Pulse: 80  Weight: 99.1 kg  Height: 6' (1.829 m)    Wt Readings from Last 3 Encounters:  07/29/22 99.1 kg  07/14/22 95.3 kg  07/03/22 96.6 kg    Labs: Lab Results  Component Value Date   NA 137 07/03/2022   K 3.9 07/03/2022   CL 100 07/03/2022   CO2 26 07/03/2022   GLUCOSE 158 (H) 07/03/2022   BUN 15 07/03/2022   CREATININE 1.12 07/03/2022   CALCIUM 9.4 07/03/2022   PHOS 3.2 06/20/2014   MG 2.1 06/02/2019   No results found for: "INR" Lab Results  Component Value Date   CHOL 170 06/17/2022   HDL 50.30 06/17/2022   LDLCALC 83 06/17/2022   TRIG 183.0 (H) 06/17/2022     GEN- The patient is well appearing, alert and oriented x 3 today.   Head- normocephalic, atraumatic Eyes-  Sclera clear, conjunctiva pink Ears- hearing intact Oropharynx- clear Neck- supple, no JVP Lymph- no cervical lymphadenopathy Lungs- Clear to ausculation bilaterally, normal work of breathing Heart- irregular rate and rhythm, no murmurs, rubs or gallops, PMI not laterally displaced GI- soft, NT, ND, + BS Extremities- no clubbing, cyanosis, or edema MS- no significant deformity or atrophy Skin- no rash or lesion Psych- euthymic mood, full affect Neuro- strength and sensation are intact  EKG- Vent. rate 80 BPM PR interval * ms QRS duration 86 ms QT/QTcB 372/429 ms P-R-T axes * 87 58 Atrial fibrillation Abnormal ECG When compared with ECG of 14-Jul-2022 10:58, PREVIOUS ECG IS PRESENT    Assessment and Plan:   1. New onset afib Atrial flutter ablation x 2 in the past, 2016 and 12/22 Successful cardioversion but now back  in atrial fibrillation with CVR Continue metoprolol succinate 50 mg bid for rate control  He has an appointment pending with Dr. Myles Gip for 10/24 to discuss afib ablation  He is drinking 2 glasses of wine nightly Correlation of alcohol consumption  and afib discussed with pt   2. CHA2DS2VASc  score of 2 Continue eliquis 5 mg bid without missed doses  3. HTN Soft today At home this am he had a systolic of  753  F/u in with Dr. Myles Gip as scheduled 10/24  Geroge Baseman. Hutson Luft, [facility] [address] [place], [place] [address] [phone]

## 2022-08-05 ENCOUNTER — Ambulatory Visit: Payer: Medicare Other | Attending: Cardiovascular Disease | Admitting: Cardiovascular Disease

## 2022-08-05 ENCOUNTER — Encounter: Payer: Self-pay | Admitting: Cardiovascular Disease

## 2022-08-05 ENCOUNTER — Encounter: Payer: Medicare Other | Admitting: Internal Medicine

## 2022-08-05 VITALS — BP 100/80 | HR 78 | Ht 72.0 in | Wt 216.0 lb

## 2022-08-05 DIAGNOSIS — I4819 Other persistent atrial fibrillation: Secondary | ICD-10-CM

## 2022-08-05 NOTE — Addendum Note (Signed)
Addended by: Bernestine Amass on: 08/05/2022 03:52 PM   Modules accepted: Orders

## 2022-08-05 NOTE — Patient Instructions (Addendum)
Medication Instructions:  Your physician recommends that you continue on your current medications as directed. Please refer to the Current Medication list given to you today.  *If you need a refill on your cardiac medications before your next appointment, please call your pharmacy*   Lab Work: TODAY: BMET and CBC  If you have labs (blood work) drawn today and your tests are completely normal, you will receive your results only by: [place] (if you have MyChart) OR A paper copy in the mail If you have any lab test that is abnormal or we need to change your treatment, we will call you to review the results.   Testing/Procedures: Your physician has requested that you have cardiac CT. Cardiac computed tomography (CT) is a painless test that uses an x-ray machine to take clear, detailed pictures of your heart. For further information please visit [facility]uge[facility]tn. Please follow instruction sheet as given.  Your physician has recommended that you have an ablation. Catheter ablation is a medical procedure used to treat some cardiac arrhythmias (irregular heartbeats). During catheter ablation, a long, thin, flexible tube is put into a blood vessel in your groin (upper thigh), or neck. This tube is called an ablation catheter. It is then guided to your heart through the blood vessel. Radio frequency waves destroy small areas of heart tissue where abnormal heartbeats may cause an arrhythmia to start. Please see the instruction sheet given to you today.  Follow-Up: At [facility], you and your health needs are our priority.  As part of our continuing mission to provide you with exceptional heart care, we have created designated Provider Care Teams.  These Care Teams include your primary Cardiologist (physician) and Advanced Practice Providers (APPs -  Physician Assistants and Nurse Practitioners) who all work together to provide you with the care you need, when you need it.  Your next  appointment:   See instruction letter  Important Information About Sugar

## 2022-08-05 NOTE — H&P (View-Only) (Signed)
Cardiology Office Note:    Date:  08/05/2022   ID:  William Phillips, DOB 12/13/1950, MRN [id number]  PCP:  Mosie Lukes, MD   [place] Providers Cardiologist:  None Electrophysiologist:  Melida Quitter, MD     Referring MD: Mosie Lukes, MD   Chief complaint: fatigue  History of Present Illness:    William Phillips is a 71 y.o. male with a hx of aflutter ablation x 2 with Dr. Rayann Heman, in 2016 and again in 09/2021.  Atrial fibrillation  was recently detected by his PCP.  The patient had noticed some increased exertional dyspnea.  Eliquis -- which has been discontinued -- and metoprolol were restarted.  He underwent DC cardioversion on July 14, 2022, but was again noted to be in atrial fibrillation in follow-up October 17.  He reports that early in the summer, he was frequently going on walks with his wife.  At some point he noticed a decrease in his energy levels.  He lost interest in going to walks and began to take naps in the late afternoon and early evenings.  This appeared to correlate with onset of his atrial fibrillation.  Past Medical History:  Diagnosis Date   Allergic rhinitis    Ankle fracture 1998   Atrial flutter ([place])    a. s/p CTI ablation by Dr Rayann Heman 11/2014   Back pain 08/05/2015   BPH (benign prostatic hyperplasia) 08/26/2015   Ganglion cyst of dorsum of right wrist 04/08/2017   GERD (gastroesophageal reflux disease)    Hammer toe of right foot 05/06/2013   History of carpal tunnel syndrome    Hyperlipidemia    Hypertension    Insect bite 04/23/2015   OA (osteoarthritis) 02/06/2013   knees   Pedal edema 04/08/2017   Preventative health care 04/27/2011   Had normal colonoscopy in 2011     Past Surgical History:  Procedure Laterality Date   A-FLUTTER ABLATION N/A 09/12/2021   Procedure: A-FLUTTER ABLATION;  Surgeon: Thompson Grayer, MD;  Location: [facility];  Service: Cardiovascular;  Laterality: N/A;   ABLATION   11-24-14   CTI ablation by Dr Rayann Heman   ANKLE SURGERY     ATRIAL FLUTTER ABLATION N/A 11/24/2014   Procedure: ATRIAL FLUTTER ABLATION;  Surgeon: Thompson Grayer, MD;  Location: [facility] CATH LAB;  Service: Cardiovascular;  Laterality: N/A;   CARDIOVERSION N/A 07/14/2022   Procedure: CARDIOVERSION;  Surgeon: Pixie Casino, MD;  Location: [facility] ENDOSCOPY;  Service: Cardiovascular;  Laterality: N/A;   Carpel tunnel     COLONOSCOPY  08/04/2002   Normal Exam- Dr Fransico Meadow HERNIA REPAIR  2008   TONSILLECTOMY      Current Medications: Current Meds  Medication Sig   amLODipine (NORVASC) 10 MG tablet TAKE ONE-HALF TABLET BY MOUTH  TWICE DAILY   apixaban (ELIQUIS) 5 MG TABS tablet Take 1 tablet (5 mg total) by mouth 2 (two) times daily.   Ascorbic Acid (VITAMIN C PO) Take 1 tablet by mouth daily.   Brimonidine Tartrate (LUMIFY) 0.025 % SOLN Place 1 drop into both eyes every morning.   Carboxymethylcellulose Sodium (LUBRICANT EYE DROPS OP) Place 1 drop into both eyes 2 (two) times daily as needed (Dry eye).   Cholecalciferol (VITAMIN D3) 50 MCG (2000 UT) TABS Take 2,000 Units by mouth daily.   Clobetasol Propionate 0.05 % shampoo Apply 1 application topically daily as needed (irritation).   fexofenadine (ALLEGRA) 180 MG tablet Take 1 tablet (180 mg  total) by mouth daily.   fluticasone (FLONASE) 50 MCG/ACT nasal spray Place 2 sprays into both nostrils daily as needed for allergies or rhinitis.   ketoconazole (NIZORAL) 2 % shampoo Apply 1 application topically daily as needed for irritation.   lisinopril-hydrochlorothiazide (ZESTORETIC) 20-12.5 MG tablet TAKE 1 TABLET BY MOUTH DAILY   metoprolol succinate (TOPROL-XL) 50 MG 24 hr tablet Take 1 tablet (50 mg total) by mouth 2 (two) times daily. Take with or immediately following a meal.   Multiple Vitamin (MULTIVITAMIN) tablet Take 1 tablet by mouth every evening.   omeprazole (PRILOSEC) 20 MG capsule TAKE 1 CAPSULE BY MOUTH  DAILY   sildenafil (REVATIO)  20 MG tablet TAKE ONE TO FOUR TABLETS BY MOUTH DAILY AS NEEDED   simvastatin (ZOCOR) 20 MG tablet TAKE 1 TABLET BY MOUTH AT  BEDTIME   tamsulosin (FLOMAX) 0.4 MG CAPS capsule TAKE 1 CAPSULE BY MOUTH  DAILY   tiZANidine (ZANAFLEX) 2 MG tablet Take 0.5-2 tablets (1-4 mg total) by mouth 2 (two) times daily as needed for muscle spasms.     Allergies:   Penicillins   Social History   Socioeconomic History   Marital status: Married    Spouse name: Not on file   Number of children: 1   Years of education: Not on file   Highest education level: Not on file  Occupational History    Comment: Sales  Tobacco Use   Smoking status: Former   Smokeless tobacco: Never   Tobacco comments:    5-10 pack year history  Vaping Use   Vaping Use: Never used  Substance and Sexual Activity   Alcohol use: Yes    Comment: 2-3 glasses of wine per day   Drug use: No   Sexual activity: Yes  Other Topics Concern   Not on file  Social History Narrative   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, seed, erosion control)   Married- 37 years   21 daughter -Paramedic in   ([place]   Previous smoker - 5-10 pack yrs     Alcohol use-yes (2-3 glasses of wine)     Social Determinants of Health   Financial Resource Strain: Low Risk  (10/01/2021)   Overall Financial Resource Strain (CARDIA)    Difficulty of Paying Living Expenses: Not hard at all  Food Insecurity: No Food Insecurity (10/01/2021)   Hunger Vital Sign    Worried About Running Out of Food in the Last Year: Never true    [place] in the Last Year: Never true  Transportation Needs: No Transportation Needs (10/01/2021)   PRAPARE - Hydrologist (Medical): No    Lack of Transportation (Non-Medical): No  Physical Activity: Not on file  Stress: No Stress Concern Present (10/01/2021)   [place]    Feeling of Stress : Not at all  Social  Connections: [place] (10/01/2021)   Social Connection and Isolation Panel [NHANES]    Frequency of Communication with Friends and Family: More than three times a week    Frequency of Social Gatherings with Friends and Family: More than three times a week    Attends Religious Services: More than 4 times per year    Active Member of [employer] or Organizations: Yes    Attends Music therapist: More than 4 times per year    Marital Status: Married     Family History: The patient's family history includes  Arthritis in his mother; COPD in his father; Cancer (age of onset: 55) in his sister; Coronary artery disease in his father; Dementia in his father; Diabetes in his maternal grandfather and sister; Diabetes type II in his maternal grandfather; Heart attack (age of onset: 28) in his father; Hyperlipidemia in his father and another family member; Hypertension in his father and another family member; Stroke in his mother.  ROS:   Please see the history of present illness.    All other systems reviewed and are negative.  EKGs/Labs/Other Studies Reviewed:     TTE 06/21/2021  1. Left ventricular ejection fraction, by estimation, is 50 to 55%. The  left ventricle has low normal function. The left ventricle has no regional  wall motion abnormalities. Left ventricular diastolic parameters are  consistent with Grade I diastolic  dysfunction (impaired relaxation).   2. Right ventricular systolic function is normal. The right ventricular  size is normal.   3. The mitral valve is normal in structure. No evidence of mitral valve  regurgitation. No evidence of mitral stenosis.   4. The aortic valve is normal in structure. Aortic valve regurgitation is  trivial. Mild aortic valve sclerosis is present, with no evidence of  aortic valve stenosis.   5. The inferior vena cava is normal in size with greater than 50%  respiratory variability, suggesting right atrial pressure of 3 mmHg.     EKG:  Last EKG results: today - AF with controlled rates   Recent Labs: 06/17/2022: ALT 22; TSH 1.42 07/03/2022: BUN 15; Creatinine, Ser 1.12; Hemoglobin 15.9; Platelets 198; Potassium 3.9; Sodium 137         Physical Exam:    VS:  BP 100/80 (BP Location: Left Arm, Patient Position: Sitting, Cuff Size: Normal)   Pulse 78   Ht 6' (1.829 m)   Wt 216 lb (98 kg)   SpO2 96%   BMI 29.29 kg/m     Wt Readings from Last 3 Encounters:  08/05/22 216 lb (98 kg)  07/29/22 218 lb 6.4 oz (99.1 kg)  07/14/22 210 lb (95.3 kg)     GEN: Well nourished, well developed in no acute distress CARDIAC: irregular, no murmurs, rubs, gallops RESPIRATORY:  Normal work of breathing MUSCULOSKELETAL: no edema    ASSESSMENT & PLAN:    Atrial fibrillation: symptomatic with fatigue despite rate control. We discussed management options. He tolerated his flutter ablation well and would prefer an early invasive strategy. We discussed the indication, rationale, logistics, anticipated benefits, and potential risks of the ablation procedure including but not limited to -- bleed at the groin access site, chest pain, damage to nearby organs such as the diaphragm, lungs, or esophagus, need for a drainage tube, or prolonged hospitalization. I explained that the risk for stroke, heart attack, need for open chest surgery, or even death is very low but not zero. He expressed understanding and wishes to proceed.  Typical atrial flutter: appears to be controlled after ablation #2 Secondary hypercoagulable state: Continue Eliquis          Medication Adjustments/Labs and Tests Ordered: Current medicines are reviewed at length with the patient today.  Concerns regarding medicines are outlined above.  Orders Placed This Encounter  Procedures   EKG 12-Lead   No orders of the defined types were placed in this encounter.    Signed, Melida Quitter, MD  08/05/2022 3:28 PM    [place]

## 2022-08-05 NOTE — Progress Notes (Signed)
Cardiology Office Note:    Date:  08/05/2022   ID:  William Phillips, DOB Dec 10, 1950, MRN [id number]  PCP:  Mosie Lukes, MD   [place] Providers Cardiologist:  None Electrophysiologist:  Melida Quitter, MD     Referring MD: Mosie Lukes, MD   Chief complaint: fatigue  History of Present Illness:    William Phillips is a 71 y.o. male with a hx of aflutter ablation x 2 with Dr. Rayann Heman, in 2016 and again in 09/2021.  Atrial fibrillation  was recently detected by his PCP.  The patient had noticed some increased exertional dyspnea.  Eliquis -- which has been discontinued -- and metoprolol were restarted.  He underwent DC cardioversion on July 14, 2022, but was again noted to be in atrial fibrillation in follow-up October 17.  He reports that early in the summer, he was frequently going on walks with his wife.  At some point he noticed a decrease in his energy levels.  He lost interest in going to walks and began to take naps in the late afternoon and early evenings.  This appeared to correlate with onset of his atrial fibrillation.  Past Medical History:  Diagnosis Date   Allergic rhinitis    Ankle fracture 1998   Atrial flutter ([place])    a. s/p CTI ablation by Dr Rayann Heman 11/2014   Back pain 08/05/2015   BPH (benign prostatic hyperplasia) 08/26/2015   Ganglion cyst of dorsum of right wrist 04/08/2017   GERD (gastroesophageal reflux disease)    Hammer toe of right foot 05/06/2013   History of carpal tunnel syndrome    Hyperlipidemia    Hypertension    Insect bite 04/23/2015   OA (osteoarthritis) 02/06/2013   knees   Pedal edema 04/08/2017   Preventative health care 04/27/2011   Had normal colonoscopy in 2011     Past Surgical History:  Procedure Laterality Date   A-FLUTTER ABLATION N/A 09/12/2021   Procedure: A-FLUTTER ABLATION;  Surgeon: Thompson Grayer, MD;  Location: [facility];  Service: Cardiovascular;  Laterality: N/A;   ABLATION   11-24-14   CTI ablation by Dr Rayann Heman   ANKLE SURGERY     ATRIAL FLUTTER ABLATION N/A 11/24/2014   Procedure: ATRIAL FLUTTER ABLATION;  Surgeon: Thompson Grayer, MD;  Location: [facility] CATH LAB;  Service: Cardiovascular;  Laterality: N/A;   CARDIOVERSION N/A 07/14/2022   Procedure: CARDIOVERSION;  Surgeon: Pixie Casino, MD;  Location: [facility] ENDOSCOPY;  Service: Cardiovascular;  Laterality: N/A;   Carpel tunnel     COLONOSCOPY  08/04/2002   Normal Exam- Dr Fransico Meadow HERNIA REPAIR  2008   TONSILLECTOMY      Current Medications: Current Meds  Medication Sig   amLODipine (NORVASC) 10 MG tablet TAKE ONE-HALF TABLET BY MOUTH  TWICE DAILY   apixaban (ELIQUIS) 5 MG TABS tablet Take 1 tablet (5 mg total) by mouth 2 (two) times daily.   Ascorbic Acid (VITAMIN C PO) Take 1 tablet by mouth daily.   Brimonidine Tartrate (LUMIFY) 0.025 % SOLN Place 1 drop into both eyes every morning.   Carboxymethylcellulose Sodium (LUBRICANT EYE DROPS OP) Place 1 drop into both eyes 2 (two) times daily as needed (Dry eye).   Cholecalciferol (VITAMIN D3) 50 MCG (2000 UT) TABS Take 2,000 Units by mouth daily.   Clobetasol Propionate 0.05 % shampoo Apply 1 application topically daily as needed (irritation).   fexofenadine (ALLEGRA) 180 MG tablet Take 1 tablet (180 mg  total) by mouth daily.   fluticasone (FLONASE) 50 MCG/ACT nasal spray Place 2 sprays into both nostrils daily as needed for allergies or rhinitis.   ketoconazole (NIZORAL) 2 % shampoo Apply 1 application topically daily as needed for irritation.   lisinopril-hydrochlorothiazide (ZESTORETIC) 20-12.5 MG tablet TAKE 1 TABLET BY MOUTH DAILY   metoprolol succinate (TOPROL-XL) 50 MG 24 hr tablet Take 1 tablet (50 mg total) by mouth 2 (two) times daily. Take with or immediately following a meal.   Multiple Vitamin (MULTIVITAMIN) tablet Take 1 tablet by mouth every evening.   omeprazole (PRILOSEC) 20 MG capsule TAKE 1 CAPSULE BY MOUTH  DAILY   sildenafil (REVATIO)  20 MG tablet TAKE ONE TO FOUR TABLETS BY MOUTH DAILY AS NEEDED   simvastatin (ZOCOR) 20 MG tablet TAKE 1 TABLET BY MOUTH AT  BEDTIME   tamsulosin (FLOMAX) 0.4 MG CAPS capsule TAKE 1 CAPSULE BY MOUTH  DAILY   tiZANidine (ZANAFLEX) 2 MG tablet Take 0.5-2 tablets (1-4 mg total) by mouth 2 (two) times daily as needed for muscle spasms.     Allergies:   Penicillins   Social History   Socioeconomic History   Marital status: Married    Spouse name: Not on file   Number of children: 1   Years of education: Not on file   Highest education level: Not on file  Occupational History    Comment: Sales  Tobacco Use   Smoking status: Former   Smokeless tobacco: Never   Tobacco comments:    5-10 pack year history  Vaping Use   Vaping Use: Never used  Substance and Sexual Activity   Alcohol use: Yes    Comment: 2-3 glasses of wine per day   Drug use: No   Sexual activity: Yes  Other Topics Concern   Not on file  Social History Narrative   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, seed, erosion control)   Married- 32 years   21 daughter -Paramedic in   ([place]   Previous smoker - 5-10 pack yrs     Alcohol use-yes (2-3 glasses of wine)     Social Determinants of Health   Financial Resource Strain: Low Risk  (10/01/2021)   Overall Financial Resource Strain (CARDIA)    Difficulty of Paying Living Expenses: Not hard at all  Food Insecurity: No Food Insecurity (10/01/2021)   Hunger Vital Sign    Worried About Running Out of Food in the Last Year: Never true    [place] in the Last Year: Never true  Transportation Needs: No Transportation Needs (10/01/2021)   PRAPARE - Hydrologist (Medical): No    Lack of Transportation (Non-Medical): No  Physical Activity: Not on file  Stress: No Stress Concern Present (10/01/2021)   [place] [facility]    Feeling of Stress : Not at all  Social  Connections: [place] (10/01/2021)   Social Connection and Isolation Panel [NHANES]    Frequency of Communication with Friends and Family: More than three times a week    Frequency of Social Gatherings with Friends and Family: More than three times a week    Attends Religious Services: More than 4 times per year    Active Member of [employer] or Organizations: Yes    Attends Music therapist: More than 4 times per year    Marital Status: Married     Family History: The patient's family history includes  Arthritis in his mother; COPD in his father; Cancer (age of onset: 62) in his sister; Coronary artery disease in his father; Dementia in his father; Diabetes in his maternal grandfather and sister; Diabetes type II in his maternal grandfather; Heart attack (age of onset: 33) in his father; Hyperlipidemia in his father and another family member; Hypertension in his father and another family member; Stroke in his mother.  ROS:   Please see the history of present illness.    All other systems reviewed and are negative.  EKGs/Labs/Other Studies Reviewed:     TTE 06/21/2021  1. Left ventricular ejection fraction, by estimation, is 50 to 55%. The  left ventricle has low normal function. The left ventricle has no regional  wall motion abnormalities. Left ventricular diastolic parameters are  consistent with Grade I diastolic  dysfunction (impaired relaxation).   2. Right ventricular systolic function is normal. The right ventricular  size is normal.   3. The mitral valve is normal in structure. No evidence of mitral valve  regurgitation. No evidence of mitral stenosis.   4. The aortic valve is normal in structure. Aortic valve regurgitation is  trivial. Mild aortic valve sclerosis is present, with no evidence of  aortic valve stenosis.   5. The inferior vena cava is normal in size with greater than 50%  respiratory variability, suggesting right atrial pressure of 3 mmHg.     EKG:  Last EKG results: today - AF with controlled rates   Recent Labs: 06/17/2022: ALT 22; TSH 1.42 07/03/2022: BUN 15; Creatinine, Ser 1.12; Hemoglobin 15.9; Platelets 198; Potassium 3.9; Sodium 137         Physical Exam:    VS:  BP 100/80 (BP Location: Left Arm, Patient Position: Sitting, Cuff Size: Normal)   Pulse 78   Ht 6' (1.829 m)   Wt 216 lb (98 kg)   SpO2 96%   BMI 29.29 kg/m     Wt Readings from Last 3 Encounters:  08/05/22 216 lb (98 kg)  07/29/22 218 lb 6.4 oz (99.1 kg)  07/14/22 210 lb (95.3 kg)     GEN: Well nourished, well developed in no acute distress CARDIAC: irregular, no murmurs, rubs, gallops RESPIRATORY:  Normal work of breathing MUSCULOSKELETAL: no edema    ASSESSMENT & PLAN:    Atrial fibrillation: symptomatic with fatigue despite rate control. We discussed management options. He tolerated his flutter ablation well and would prefer an early invasive strategy. We discussed the indication, rationale, logistics, anticipated benefits, and potential risks of the ablation procedure including but not limited to -- bleed at the groin access site, chest pain, damage to nearby organs such as the diaphragm, lungs, or esophagus, need for a drainage tube, or prolonged hospitalization. I explained that the risk for stroke, heart attack, need for open chest surgery, or even death is very low but not zero. He expressed understanding and wishes to proceed.  Typical atrial flutter: appears to be controlled after ablation #2 Secondary hypercoagulable state: Continue Eliquis          Medication Adjustments/Labs and Tests Ordered: Current medicines are reviewed at length with the patient today.  Concerns regarding medicines are outlined above.  Orders Placed This Encounter  Procedures   EKG 12-Lead   No orders of the defined types were placed in this encounter.    Signed, Melida Quitter, MD  08/05/2022 3:28 PM    [place]

## 2022-08-06 LAB — CBC WITH DIFFERENTIAL/PLATELET
Basophils Absolute: 0 10*3/uL (ref 0.0–0.2)
Basos: 0 %
EOS (ABSOLUTE): 0.2 10*3/uL (ref 0.0–0.4)
Eos: 3 %
Hematocrit: 44.8 % (ref 37.5–51.0)
Hemoglobin: 15.2 g/dL (ref 13.0–17.7)
Immature Grans (Abs): 0 10*3/uL (ref 0.0–0.1)
Immature Granulocytes: 0 %
Lymphocytes Absolute: 2.4 10*3/uL (ref 0.7–3.1)
Lymphs: 31 %
MCH: 32.9 pg (ref 26.6–33.0)
MCHC: 33.9 g/dL (ref 31.5–35.7)
MCV: 97 fL (ref 79–97)
Monocytes Absolute: 0.9 10*3/uL (ref 0.1–0.9)
Monocytes: 11 %
Neutrophils Absolute: 4.3 10*3/uL (ref 1.4–7.0)
Neutrophils: 55 %
Platelets: 171 10*3/uL (ref 150–450)
RBC: 4.62 x10E6/uL (ref 4.14–5.80)
RDW: 11.8 % (ref 11.6–15.4)
WBC: 7.8 10*3/uL (ref 3.4–10.8)

## 2022-08-06 LAB — BASIC METABOLIC PANEL
BUN/Creatinine Ratio: 12 (ref 10–24)
BUN: 10 mg/dL (ref 8–27)
CO2: 26 mmol/L (ref 20–29)
Calcium: 9.6 mg/dL (ref 8.6–10.2)
Chloride: 98 mmol/L (ref 96–106)
Creatinine, Ser: 0.84 mg/dL (ref 0.76–1.27)
Glucose: 106 mg/dL — ABNORMAL HIGH (ref 70–99)
Potassium: 3.9 mmol/L (ref 3.5–5.2)
Sodium: 139 mmol/L (ref 134–144)
eGFR: 94 mL/min/{1.73_m2} (ref >59)

## 2022-08-08 ENCOUNTER — Encounter (HOSPITAL_COMMUNITY): Payer: Self-pay

## 2022-08-11 DIAGNOSIS — D225 Melanocytic nevi of trunk: Secondary | ICD-10-CM | POA: Diagnosis not present

## 2022-08-11 DIAGNOSIS — Z85828 Personal history of other malignant neoplasm of skin: Secondary | ICD-10-CM | POA: Diagnosis not present

## 2022-08-11 DIAGNOSIS — Z129 Encounter for screening for malignant neoplasm, site unspecified: Secondary | ICD-10-CM | POA: Diagnosis not present

## 2022-08-11 DIAGNOSIS — L738 Other specified follicular disorders: Secondary | ICD-10-CM | POA: Diagnosis not present

## 2022-08-18 ENCOUNTER — Other Ambulatory Visit: Payer: Medicare Other

## 2022-08-26 ENCOUNTER — Ambulatory Visit (HOSPITAL_COMMUNITY): Payer: Medicare Other

## 2022-08-27 ENCOUNTER — Telehealth (HOSPITAL_COMMUNITY): Payer: Self-pay | Admitting: Emergency Medicine

## 2022-08-27 NOTE — Telephone Encounter (Signed)
Attempted to call patient regarding upcoming cardiac CT appointment. °Left message on voicemail with name and callback number °Chanz Cahall RN Navigator Cardiac Imaging °[facility] °[phone] Office °[phone] Cell ° °

## 2022-08-28 ENCOUNTER — Other Ambulatory Visit: Payer: Medicare Other

## 2022-08-28 ENCOUNTER — Ambulatory Visit (HOSPITAL_COMMUNITY)
Admission: RE | Admit: 2022-08-28 | Discharge: 2022-08-28 | Disposition: A | Discharge status: Home / Self Care | Payer: Medicare Other | Source: Ambulatory Visit | Attending: Cardiovascular Disease | Admitting: Cardiovascular Disease

## 2022-08-28 DIAGNOSIS — I4819 Other persistent atrial fibrillation: Secondary | ICD-10-CM | POA: Diagnosis not present

## 2022-08-28 MED ORDER — IOHEXOL 350 MG/ML SOLN
95.0000 mL | Freq: Once | INTRAVENOUS | Status: AC | PRN
  Administered 2022-08-28: 95 mL via INTRAVENOUS

## 2022-08-29 ENCOUNTER — Telehealth: Payer: Self-pay | Admitting: Cardiovascular Disease

## 2022-08-29 NOTE — Telephone Encounter (Signed)
Patient wants to know if there are any special instructions for his ablation he needs to follow.  As well as the time he needs to be there.

## 2022-08-29 NOTE — Telephone Encounter (Signed)
Spoke with the patient and reviewed instructions for his ablation. Advised letter is located in his MyChart for review. Will also send instructions via MyChart message.

## 2022-09-01 NOTE — Pre-Procedure Instructions (Signed)
Attempted to call patient regarding procedure instructions.  Left voice mail on the following items: Arrival time 0530 Nothing to eat or drink after midnight No meds AM of procedure Responsible person to drive you home and stay with you for 24 hrs  Have you missed any doses of anti-coagulant Eliquis- if you have missed any doses please let office know right away.

## 2022-09-02 ENCOUNTER — Other Ambulatory Visit: Payer: Self-pay

## 2022-09-02 ENCOUNTER — Ambulatory Visit (HOSPITAL_COMMUNITY): Payer: Medicare Other | Admitting: Anesthesiology

## 2022-09-02 ENCOUNTER — Encounter (HOSPITAL_COMMUNITY): Payer: Self-pay | Admitting: Cardiovascular Disease

## 2022-09-02 ENCOUNTER — Encounter (HOSPITAL_COMMUNITY)
Admission: RE | Disposition: A | Discharge status: Home / Self Care | Payer: Self-pay | Source: Home / Self Care | Attending: Cardiovascular Disease

## 2022-09-02 ENCOUNTER — Other Ambulatory Visit (HOSPITAL_COMMUNITY): Payer: Self-pay

## 2022-09-02 ENCOUNTER — Ambulatory Visit (HOSPITAL_BASED_OUTPATIENT_CLINIC_OR_DEPARTMENT_OTHER): Payer: Medicare Other | Admitting: Anesthesiology

## 2022-09-02 ENCOUNTER — Ambulatory Visit (HOSPITAL_COMMUNITY)
Admission: RE | Admit: 2022-09-02 | Discharge: 2022-09-02 | Disposition: A | Discharge status: Home / Self Care | Payer: Medicare Other | Attending: Cardiovascular Disease | Admitting: Cardiovascular Disease

## 2022-09-02 DIAGNOSIS — Z79899 Other long term (current) drug therapy: Secondary | ICD-10-CM | POA: Diagnosis not present

## 2022-09-02 DIAGNOSIS — I4819 Other persistent atrial fibrillation: Secondary | ICD-10-CM | POA: Insufficient documentation

## 2022-09-02 DIAGNOSIS — Z7901 Long term (current) use of anticoagulants: Secondary | ICD-10-CM | POA: Insufficient documentation

## 2022-09-02 DIAGNOSIS — K219 Gastro-esophageal reflux disease without esophagitis: Secondary | ICD-10-CM | POA: Diagnosis not present

## 2022-09-02 DIAGNOSIS — I10 Essential (primary) hypertension: Secondary | ICD-10-CM | POA: Insufficient documentation

## 2022-09-02 DIAGNOSIS — I483 Typical atrial flutter: Secondary | ICD-10-CM | POA: Diagnosis not present

## 2022-09-02 DIAGNOSIS — Z87891 Personal history of nicotine dependence: Secondary | ICD-10-CM

## 2022-09-02 DIAGNOSIS — I4891 Unspecified atrial fibrillation: Secondary | ICD-10-CM | POA: Diagnosis not present

## 2022-09-02 DIAGNOSIS — D6869 Other thrombophilia: Secondary | ICD-10-CM | POA: Insufficient documentation

## 2022-09-02 HISTORY — PX: ATRIAL FIBRILLATION ABLATION: EP1191

## 2022-09-02 LAB — POCT ACTIVATED CLOTTING TIME
Activated Clotting Time: 287 seconds
Activated Clotting Time: 293 seconds
Activated Clotting Time: 299 seconds
Activated Clotting Time: 329 seconds

## 2022-09-02 SURGERY — ATRIAL FIBRILLATION ABLATION
Anesthesia: General

## 2022-09-02 MED ORDER — HEPARIN SODIUM (PORCINE) 1000 UNIT/ML IJ SOLN
INTRAMUSCULAR | Status: DC | PRN
  Administered 2022-09-02: 1000 [IU] via INTRAVENOUS

## 2022-09-02 MED ORDER — HEPARIN SODIUM (PORCINE) 1000 UNIT/ML IJ SOLN
INTRAMUSCULAR | Status: DC | PRN
  Administered 2022-09-02: 15000 [IU] via INTRAVENOUS
  Administered 2022-09-02: 3000 [IU] via INTRAVENOUS
  Administered 2022-09-02: 2000 [IU] via INTRAVENOUS
  Administered 2022-09-02: 3000 [IU] via INTRAVENOUS

## 2022-09-02 MED ORDER — SODIUM CHLORIDE 0.9 % IV SOLN: INTRAVENOUS | Status: DC

## 2022-09-02 MED ORDER — SODIUM CHLORIDE 0.9 % IV SOLN: 250.0000 mL | INTRAVENOUS | Status: DC | PRN

## 2022-09-02 MED ORDER — COLCHICINE 0.6 MG PO TABS
0.6000 mg | ORAL_TABLET | Freq: Two times a day (BID) | ORAL | 0 refills | Status: DC
  Filled 2022-09-02: qty 10, 5d supply, fill #0

## 2022-09-02 MED ORDER — PHENYLEPHRINE 80 MCG/ML (10ML) SYRINGE FOR IV PUSH (FOR BLOOD PRESSURE SUPPORT)
PREFILLED_SYRINGE | INTRAVENOUS | Status: DC | PRN
  Administered 2022-09-02 (×3): 160 ug via INTRAVENOUS
  Administered 2022-09-02 (×2): 80 ug via INTRAVENOUS

## 2022-09-02 MED ORDER — ACETAMINOPHEN 325 MG PO TABS: 650.0000 mg | ORAL_TABLET | ORAL | Status: DC | PRN

## 2022-09-02 MED ORDER — PROTAMINE SULFATE 10 MG/ML IV SOLN
INTRAVENOUS | Status: DC | PRN
  Administered 2022-09-02: 50 mg via INTRAVENOUS

## 2022-09-02 MED ORDER — SODIUM CHLORIDE 0.9% FLUSH: 3.0000 mL | INTRAVENOUS | Status: DC | PRN

## 2022-09-02 MED ORDER — SUGAMMADEX SODIUM 200 MG/2ML IV SOLN
INTRAVENOUS | Status: DC | PRN
  Administered 2022-09-02: 200 mg via INTRAVENOUS

## 2022-09-02 MED ORDER — DOBUTAMINE INFUSION FOR EP/ECHO/NUC (1000 MCG/ML)
INTRAVENOUS | Status: DC | PRN
  Administered 2022-09-02: 20 ug/kg/min via INTRAVENOUS

## 2022-09-02 MED ORDER — HEPARIN SODIUM (PORCINE) 1000 UNIT/ML IJ SOLN
INTRAMUSCULAR | Status: AC
  Filled 2022-09-02: qty 10

## 2022-09-02 MED ORDER — PHENYLEPHRINE HCL-NACL 20-0.9 MG/250ML-% IV SOLN
INTRAVENOUS | Status: DC | PRN
  Administered 2022-09-02: 50 ug/min via INTRAVENOUS

## 2022-09-02 MED ORDER — ONDANSETRON HCL 4 MG/2ML IJ SOLN: 4.0000 mg | Freq: Four times a day (QID) | INTRAMUSCULAR | Status: DC | PRN

## 2022-09-02 MED ORDER — DEXAMETHASONE SODIUM PHOSPHATE 4 MG/ML IJ SOLN
INTRAMUSCULAR | Status: DC | PRN
  Administered 2022-09-02: 5 mg via INTRAVENOUS

## 2022-09-02 MED ORDER — FENTANYL CITRATE (PF) 100 MCG/2ML IJ SOLN
INTRAMUSCULAR | Status: DC | PRN
  Administered 2022-09-02: 100 ug via INTRAVENOUS

## 2022-09-02 MED ORDER — LIDOCAINE 2% (20 MG/ML) 5 ML SYRINGE
INTRAMUSCULAR | Status: DC | PRN
  Administered 2022-09-02: 60 mg via INTRAVENOUS

## 2022-09-02 MED ORDER — ROCURONIUM BROMIDE 10 MG/ML (PF) SYRINGE
PREFILLED_SYRINGE | INTRAVENOUS | Status: DC | PRN
  Administered 2022-09-02: 40 mg via INTRAVENOUS
  Administered 2022-09-02: 60 mg via INTRAVENOUS

## 2022-09-02 MED ORDER — SODIUM CHLORIDE 0.9% FLUSH: 3.0000 mL | Freq: Two times a day (BID) | INTRAVENOUS | Status: DC

## 2022-09-02 MED ORDER — HEPARIN (PORCINE) IN NACL 1000-0.9 UT/500ML-% IV SOLN
INTRAVENOUS | Status: DC | PRN
  Administered 2022-09-02 (×4): 500 mL

## 2022-09-02 MED ORDER — EPHEDRINE SULFATE-NACL 50-0.9 MG/10ML-% IV SOSY
PREFILLED_SYRINGE | INTRAVENOUS | Status: DC | PRN
  Administered 2022-09-02: 10 mg via INTRAVENOUS
  Administered 2022-09-02: 5 mg via INTRAVENOUS

## 2022-09-02 MED ORDER — DOBUTAMINE INFUSION FOR EP/ECHO/NUC (1000 MCG/ML)
INTRAVENOUS | Status: AC
  Filled 2022-09-02: qty 250

## 2022-09-02 MED ORDER — PROPOFOL 10 MG/ML IV BOLUS
INTRAVENOUS | Status: DC | PRN
  Administered 2022-09-02: 150 mg via INTRAVENOUS

## 2022-09-02 MED ORDER — ONDANSETRON HCL 4 MG/2ML IJ SOLN
INTRAMUSCULAR | Status: DC | PRN
  Administered 2022-09-02: 4 mg via INTRAVENOUS

## 2022-09-02 SURGICAL SUPPLY — 21 items
BAG SNAP BAND KOVER 36X36 (MISCELLANEOUS) IMPLANT
BLANKET WARM UNDERBOD FULL ACC (MISCELLANEOUS) ×1 IMPLANT
CATH 8FR REPROCESSED SOUNDSTAR (CATHETERS) ×1 IMPLANT
CATH 8FR SOUNDSTAR REPROCESSED (CATHETERS) IMPLANT
CATH ABLAT QDOT MICRO BI TC DF (CATHETERS) IMPLANT
CATH OCTARAY 1.5 F (CATHETERS) IMPLANT
CATH PIGTAIL STEERABLE D1 8.7 (WIRE) IMPLANT
CATH S-M CIRCA TEMP PROBE (CATHETERS) IMPLANT
CATH WEBSTER BI DIR CS D-F CRV (CATHETERS) IMPLANT
CLOSURE PERCLOSE PROSTYLE (VASCULAR PRODUCTS) IMPLANT
COVER SWIFTLINK CONNECTOR (BAG) ×1 IMPLANT
DEVICE CLOSURE MYNXGRIP 6/7F (Vascular Products) IMPLANT
PACK EP LATEX FREE (CUSTOM PROCEDURE TRAY) ×1
PACK EP LF (CUSTOM PROCEDURE TRAY) ×1 IMPLANT
PAD DEFIB RADIO PHYSIO CONN (PAD) ×1 IMPLANT
PATCH CARTO3 (PAD) IMPLANT
SHEATH CARTO VIZIGO SM CVD (SHEATH) IMPLANT
SHEATH PINNACLE 8F 10CM (SHEATH) IMPLANT
SHEATH PINNACLE 9F 10CM (SHEATH) IMPLANT
SHEATH PROBE COVER 6X72 (BAG) IMPLANT
TUBING SMART ABLATE COOLFLOW (TUBING) IMPLANT

## 2022-09-02 NOTE — Discharge Instructions (Signed)
Post procedure care instructions No driving for 4 days. No lifting over 5 lbs for 1 week. No vigorous or sexual activity for 1 week. You may return to work/your usual activities on 09/10/22. Keep procedure site clean & dry. If you notice increased pain, swelling, bleeding or pus, call/return!  You may shower after 24 hours, but no soaking in baths/hot tubs/pools for 1 week.    You have an appointment set up with the [facility].  Multiple studies have shown that being followed by a dedicated atrial fibrillation clinic in addition to the standard care you receive from your other physicians improves health. We believe that enrollment in the atrial fibrillation clinic will allow [place] to better care for you.   The phone number to the [facility] is [phone]. The clinic is staffed Monday through Friday from 8:30am to 5pm.  Directions: The clinic is located in the [facility], [place] the hospital at the MAIN ENTRANCE "A", use [address] to the 6th floor.  Registration desk to the right of elevators on 6th floor  If you have any trouble locating the clinic, please don't hesitate to call [phone].

## 2022-09-02 NOTE — Interval H&P Note (Signed)
History and Physical Interval Note:  09/02/2022 7:09 AM  William Phillips  has presented today for surgery, with the diagnosis of atrial fibrillation.  The various methods of treatment have been discussed with the patient and family. After consideration of risks, benefits and other options for treatment, the patient has consented to  Procedure(s): ATRIAL FIBRILLATION ABLATION (N/A) as a surgical intervention.  The patient's history has been reviewed, patient examined, no change in status, stable for surgery.  I have reviewed the patient's chart and labs.  Questions were answered to the patient's satisfaction.    I reviewed the patient's CT and labs. There was no LAA thrombus. he  has not missed any doses of anticoagulation, and he took his dose last night. There have been no changes in the patient's diagnoses, medications, or condition since our recent clinic visit.   William Phillips

## 2022-09-02 NOTE — Anesthesia Preprocedure Evaluation (Addendum)
Anesthesia Evaluation  Patient identified by MRN, date of birth, ID band Patient awake    Reviewed: Allergy & Precautions, NPO status , Patient's Chart, lab work & pertinent test results  Airway Mallampati: II  TM Distance: <3 FB Neck ROM: Full    Dental  (+) Teeth Intact, Dental Advisory Given   Pulmonary former smoker   breath sounds clear to auscultation       Cardiovascular hypertension, Pt. on medications and Pt. on home beta blockers + dysrhythmias Atrial Fibrillation  Rhythm:Irregular Rate:Abnormal     Neuro/Psych negative neurological ROS  negative psych ROS   GI/Hepatic Neg liver ROS,GERD  Medicated,,  Endo/Other  negative endocrine ROS    Renal/GU negative Renal ROS     Musculoskeletal   Abdominal   Peds  Hematology negative hematology ROS (+)   Anesthesia Other Findings   Reproductive/Obstetrics                             Anesthesia Physical Anesthesia Plan  ASA: 3  Anesthesia Plan: General   Post-op Pain Management:    Induction: Intravenous  PONV Risk Score and Plan: 3 and Ondansetron and Treatment may vary due to age or medical condition  Airway Management Planned: Oral ETT  Additional Equipment: None  Intra-op Plan:   Post-operative Plan: Extubation in OR  Informed Consent: I have reviewed the patients History and Physical, chart, labs and discussed the procedure including the risks, benefits and alternatives for the proposed anesthesia with the patient or authorized representative who has indicated his/her understanding and acceptance.     Dental advisory given  Plan Discussed with: CRNA  Anesthesia Plan Comments:        Anesthesia Quick Evaluation

## 2022-09-02 NOTE — Anesthesia Postprocedure Evaluation (Signed)
Anesthesia Post Note  Patient: William Phillips  Procedure(s) Performed: ATRIAL FIBRILLATION ABLATION     Patient location during evaluation: PACU Anesthesia Type: General Level of consciousness: awake and alert Pain management: pain level controlled Vital Signs Assessment: post-procedure vital signs reviewed and stable Respiratory status: spontaneous breathing, nonlabored ventilation, respiratory function stable and patient connected to nasal cannula oxygen Cardiovascular status: blood pressure returned to baseline and stable Postop Assessment: no apparent nausea or vomiting Anesthetic complications: no   No notable events documented.  Last Vitals:  Vitals:   09/02/22 1315 09/02/22 1330  BP:    Pulse: 72 71  Resp: (!) 25 (!) 24  Temp:    SpO2: 92% 93%    Last Pain:  Vitals:   09/02/22 1101  TempSrc: Temporal  PainSc: 0-No pain                 Effie Berkshire

## 2022-09-02 NOTE — Transfer of Care (Signed)
Immediate Anesthesia Transfer of Care Note  Patient: William Phillips  Procedure(s) Performed: ATRIAL FIBRILLATION ABLATION  Patient Location: PACU  Anesthesia Type:General  Level of Consciousness: awake and alert   Airway & Oxygen Therapy: Patient Spontanous Breathing and Patient connected to nasal cannula oxygen  Post-op Assessment: Report given to RN and Post -op Vital signs reviewed and stable  Post vital signs: Reviewed and stable  Last Vitals:  Vitals Value Taken Time  BP    Temp    Pulse 78 09/02/22 1025  Resp 21 09/02/22 1025  SpO2 92 % 09/02/22 1025  Vitals shown include unvalidated device data.  Last Pain:  Vitals:   09/02/22 0651  PainSc: 0-No pain         Complications: No notable events documented.

## 2022-09-02 NOTE — Anesthesia Procedure Notes (Signed)
Procedure Name: Intubation Date/Time: 09/02/2022 7:49 AM  Performed by: Lieutenant Diego, CRNAPre-anesthesia Checklist: Patient identified, Emergency Drugs available, Suction available and Patient being monitored Patient Re-evaluated:Patient Re-evaluated prior to induction Oxygen Delivery Method: Circle system utilized Preoxygenation: Pre-oxygenation with 100% oxygen Induction Type: IV induction Ventilation: Mask ventilation without difficulty Laryngoscope Size: Miller and 2 Grade View: Grade I Tube type: Oral Tube size: 7.5 mm Number of attempts: 1 Airway Equipment and Method: Stylet Placement Confirmation: ETT inserted through vocal cords under direct vision, positive ETCO2 and breath sounds checked- equal and bilateral Secured at: 23 cm Tube secured with: Tape Dental Injury: Teeth and Oropharynx as per pre-operative assessment

## 2022-09-03 ENCOUNTER — Encounter (HOSPITAL_COMMUNITY): Payer: Self-pay | Admitting: Cardiovascular Disease

## 2022-09-15 ENCOUNTER — Other Ambulatory Visit: Payer: Self-pay | Admitting: Cardiology

## 2022-09-15 DIAGNOSIS — I4892 Unspecified atrial flutter: Secondary | ICD-10-CM

## 2022-09-17 ENCOUNTER — Other Ambulatory Visit (INDEPENDENT_AMBULATORY_CARE_PROVIDER_SITE_OTHER): Payer: Medicare Other

## 2022-09-17 DIAGNOSIS — R739 Hyperglycemia, unspecified: Secondary | ICD-10-CM | POA: Diagnosis not present

## 2022-09-17 DIAGNOSIS — I10 Essential (primary) hypertension: Secondary | ICD-10-CM

## 2022-09-17 DIAGNOSIS — E782 Mixed hyperlipidemia: Secondary | ICD-10-CM

## 2022-09-17 LAB — CBC WITH DIFFERENTIAL/PLATELET
Basophils Absolute: 0 10*3/uL (ref 0.0–0.1)
Basophils Relative: 0.7 % (ref 0.0–3.0)
Eosinophils Absolute: 0.3 10*3/uL (ref 0.0–0.7)
Eosinophils Relative: 4.2 % (ref 0.0–5.0)
HCT: 45.1 % (ref 39.0–52.0)
Hemoglobin: 15.4 g/dL (ref 13.0–17.0)
Lymphocytes Relative: 33.9 % (ref 12.0–46.0)
Lymphs Abs: 2.1 10*3/uL (ref 0.7–4.0)
MCHC: 34.1 g/dL (ref 30.0–36.0)
MCV: 96 fl (ref 78.0–100.0)
Monocytes Absolute: 0.7 10*3/uL (ref 0.1–1.0)
Monocytes Relative: 10.4 % (ref 3.0–12.0)
Neutro Abs: 3.2 10*3/uL (ref 1.4–7.7)
Neutrophils Relative %: 50.8 % (ref 43.0–77.0)
Platelets: 230 10*3/uL (ref 150.0–400.0)
RBC: 4.7 Mil/uL (ref 4.22–5.81)
RDW: 12.9 % (ref 11.5–15.5)
WBC: 6.3 10*3/uL (ref 4.0–10.5)

## 2022-09-17 LAB — COMPREHENSIVE METABOLIC PANEL
ALT: 18 U/L (ref 0–53)
AST: 17 U/L (ref 0–37)
Albumin: 4.4 g/dL (ref 3.5–5.2)
Alkaline Phosphatase: 53 U/L (ref 39–117)
BUN: 16 mg/dL (ref 6–23)
CO2: 32 mEq/L (ref 19–32)
Calcium: 9.2 mg/dL (ref 8.4–10.5)
Chloride: 99 mEq/L (ref 96–112)
Creatinine, Ser: 1.06 mg/dL (ref 0.40–1.50)
GFR: 71.14 mL/min (ref >60.00)
Glucose, Bld: 133 mg/dL — ABNORMAL HIGH (ref 70–99)
Potassium: 4.2 mEq/L (ref 3.5–5.1)
Sodium: 138 mEq/L (ref 135–145)
Total Bilirubin: 0.6 mg/dL (ref 0.2–1.2)
Total Protein: 6.9 g/dL (ref 6.0–8.3)

## 2022-09-17 LAB — LIPID PANEL
Cholesterol: 169 mg/dL (ref 0–200)
HDL: 56.9 mg/dL (ref >39.00)
LDL Cholesterol: 77 mg/dL (ref 0–99)
NonHDL: 112.11
Total CHOL/HDL Ratio: 3
Triglycerides: 177 mg/dL — ABNORMAL HIGH (ref 0.0–149.0)
VLDL: 35.4 mg/dL (ref 0.0–40.0)

## 2022-09-17 LAB — TSH: TSH: 1.87 u[IU]/mL (ref 0.35–5.50)

## 2022-09-17 LAB — HEMOGLOBIN A1C: Hgb A1c MFr Bld: 6.7 % — ABNORMAL HIGH (ref 4.6–6.5)

## 2022-09-18 NOTE — Progress Notes (Signed)
HPI: FU Atrial fibrillation/Atrial flutter. Abdominal ultrasound April 2019 showed no aneurysm.  Previously diagnosed with atrial flutter in December 2015.  He subsequently had atrial flutter ablation February 2016.  Patient previously developed COVID and developed recurrent atrial flutter.  Echocardiogram repeated September 2022 and showed normal LV function, grade 1 diastolic dysfunction.  He had atrial flutter ablation December 2022.  CT prior to atrial fibrillation ablation November 2023 showed no left atrial appendage thrombus and calcium score 462 which was 71st percentile.  Patient had ablation September 02, 2022.  Since last seen the patient has dyspnea with more extreme activities but not with routine activities. It is relieved with rest. It is not associated with chest pain. There is no orthopnea, PND or pedal edema. There is no syncope or palpitations. There is no exertional chest pain.   Current Outpatient Medications  Medication Sig Dispense Refill   amLODipine (NORVASC) 10 MG tablet TAKE ONE-HALF TABLET BY MOUTH  TWICE DAILY 100 tablet 2   Ascorbic Acid (VITAMIN C PO) Take 1 tablet by mouth daily.     Brimonidine Tartrate (LUMIFY) 0.025 % SOLN Place 1 drop into both eyes every morning.     Carboxymethylcellulose Sodium (LUBRICANT EYE DROPS OP) Place 1 drop into both eyes 2 (two) times daily as needed (Dry eye).     clindamycin (CLEOCIN T) 1 % external solution Apply 1 Application topically 2 (two) times daily.     Clobetasol Propionate 0.05 % shampoo Apply 1 application topically daily as needed (irritation).     colchicine 0.6 MG tablet Take 1 tablet (0.6 mg total) by mouth 2 (two) times daily for 5 days. 10 tablet 0   ELIQUIS 5 MG TABS tablet TAKE 1 TABLET BY MOUTH TWICE  DAILY 200 tablet 2   fexofenadine (ALLEGRA) 180 MG tablet Take 1 tablet (180 mg total) by mouth daily.     fluticasone (FLONASE) 50 MCG/ACT nasal spray Place 2 sprays into both nostrils daily as needed for  allergies or rhinitis.  2   ketoconazole (NIZORAL) 2 % shampoo Apply 1 application topically daily as needed for irritation.     lisinopril-hydrochlorothiazide (ZESTORETIC) 20-12.5 MG tablet TAKE 1 TABLET BY MOUTH DAILY 90 tablet 3   metoprolol succinate (TOPROL-XL) 50 MG 24 hr tablet Take 1 tablet (50 mg total) by mouth 2 (two) times daily. Take with or immediately following a meal. 180 tablet 1   Multiple Vitamin (MULTIVITAMIN) tablet Take 1 tablet by mouth every evening.     omeprazole (PRILOSEC) 20 MG capsule TAKE 1 CAPSULE BY MOUTH  DAILY 100 capsule 2   sildenafil (REVATIO) 20 MG tablet TAKE ONE TO FOUR TABLETS BY MOUTH DAILY AS NEEDED 90 tablet 1   simvastatin (ZOCOR) 20 MG tablet TAKE 1 TABLET BY MOUTH AT  BEDTIME 100 tablet 2   tamsulosin (FLOMAX) 0.4 MG CAPS capsule TAKE 1 CAPSULE BY MOUTH  DAILY 100 capsule 2   VITAMIN D PO Take 1 capsule by mouth daily.     No current facility-administered medications for this visit.     Past Medical History:  Diagnosis Date   Allergic rhinitis    Ankle fracture 1998   Atrial flutter ([place])    a. s/p CTI ablation by Dr Rayann Heman 11/2014   Back pain 08/05/2015   BPH (benign prostatic hyperplasia) 08/26/2015   Ganglion cyst of dorsum of right wrist 04/08/2017   GERD (gastroesophageal reflux disease)    Hammer toe of right foot 05/06/2013  History of carpal tunnel syndrome    Hyperlipidemia    Hypertension    Insect bite 04/23/2015   OA (osteoarthritis) 02/06/2013   knees   Pedal edema 04/08/2017   Preventative health care 04/27/2011   Had normal colonoscopy in 2011     Past Surgical History:  Procedure Laterality Date   A-FLUTTER ABLATION N/A 09/12/2021   Procedure: A-FLUTTER ABLATION;  Surgeon: Thompson Grayer, MD;  Location: [place] [facility];  Service: Cardiovascular;  Laterality: N/A;   ABLATION  11-24-14   CTI ablation by Dr Rayann Heman   ANKLE SURGERY     ATRIAL FIBRILLATION ABLATION N/A 09/02/2022   Procedure: ATRIAL FIBRILLATION  ABLATION;  Surgeon: Melida Quitter, MD;  Location: [place] [facility];  Service: Cardiovascular;  Laterality: N/A;   ATRIAL FLUTTER ABLATION N/A 11/24/2014   Procedure: ATRIAL FLUTTER ABLATION;  Surgeon: Thompson Grayer, MD;  Location: [facility] CATH LAB;  Service: Cardiovascular;  Laterality: N/A;   CARDIOVERSION N/A 07/14/2022   Procedure: CARDIOVERSION;  Surgeon: Pixie Casino, MD;  Location: [facility] ENDOSCOPY;  Service: Cardiovascular;  Laterality: N/A;   Carpel tunnel     COLONOSCOPY  08/04/2002   Normal Exam- Dr Fransico Meadow HERNIA REPAIR  2008   TONSILLECTOMY      Social History   Socioeconomic History   Marital status: Married    Spouse name: Not on file   Number of children: 1   Years of education: Not on file   Highest education level: Not on file  Occupational History    Comment: Sales  Tobacco Use   Smoking status: Former   Smokeless tobacco: Never   Tobacco comments:    5-10 pack year history  Vaping Use   Vaping Use: Never used  Substance and Sexual Activity   Alcohol use: Yes    Comment: 2-3 glasses of wine per day   Drug use: No   Sexual activity: Yes  Other Topics Concern   Not on file  Social History Narrative   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   Previous smoker - 5-10 pack yrs     Alcohol use-yes (2-3 glasses of wine)     Social Determinants of Health   Financial Resource Strain: Low Risk  (10/01/2021)   Overall Financial Resource Strain (CARDIA)    Difficulty of Paying Living Expenses: Not hard at all  Food Insecurity: No Food Insecurity (10/01/2021)   Hunger Vital Sign    Worried About Running Out of Food in the Last Year: Never true    [place] in the Last Year: Never true  Transportation Needs: No Transportation Needs (10/01/2021)   PRAPARE - Hydrologist (Medical): No    Lack of Transportation  (Non-Medical): No  Physical Activity: Not on file  Stress: No Stress Concern Present (10/01/2021)   [place]    Feeling of Stress : Not at all  Social Connections: [place] (10/01/2021)   Social Connection and Isolation Panel [NHANES]    Frequency of Communication with Friends and Family: More than three times a week    Frequency of Social Gatherings with Friends and Family: More than three times a week    Attends Religious Services: More than 4 times per year    Active Member of [employer] or Organizations: Yes  Attends Archivist Meetings: More than 4 times per year    Marital Status: Married  Human resources officer Violence: Not At Risk (10/01/2021)   Humiliation, Afraid, Rape, and Kick questionnaire    Fear of Current or Ex-Partner: No    Emotionally Abused: No    Physically Abused: No    Sexually Abused: No    Family History  Problem Relation Age of Onset   Arthritis Mother    Stroke Mother    Coronary artery disease Father        aortic valve disease   Heart attack Father 61   COPD Father    Hypertension Father    Hyperlipidemia Father    Dementia Father    Cancer Sister 63       breast cancer   Diabetes Sister    Diabetes type II Maternal Grandfather    Diabetes Maternal Grandfather    Hyperlipidemia Other    Hypertension Other     ROS: no fevers or chills, productive cough, hemoptysis, dysphasia, odynophagia, melena, hematochezia, dysuria, hematuria, rash, seizure activity, orthopnea, PND, pedal edema, claudication. Remaining systems are negative.  Physical Exam: Well-developed well-nourished in no acute distress.  Skin is warm and dry.  HEENT is normal.  Neck is supple.  Chest is clear to auscultation with normal expansion.  Cardiovascular exam is regular rate and rhythm.  Abdominal exam nontender or distended. No masses palpated. Extremities show no edema. neuro grossly  intact   A/P  1 paroxysmal atrial fibrillation-patient is now status post ablation and remains in sinus rhythm on exam.  Will continue Toprol and apixaban.  2 status post atrial flutter ablation  3 hypertension-patient's blood pressure is controlled.  Continue present medications.  4 coronary calcification-noted on recent CT.  Patient denies chest pain.  Continue statin.  5 hyperlipidemia-given documented CAD will DC zocor and treat with crestor 40 mg daily; lipids and liver 8 weeks.  Kirk Ruths, MD

## 2022-09-29 ENCOUNTER — Ambulatory Visit: Payer: Medicare Other | Attending: Cardiology | Admitting: Cardiology

## 2022-09-29 ENCOUNTER — Encounter: Payer: Self-pay | Admitting: Cardiology

## 2022-09-29 VITALS — BP 108/62 | HR 67 | Ht 72.0 in | Wt 213.8 lb

## 2022-09-29 DIAGNOSIS — I10 Essential (primary) hypertension: Secondary | ICD-10-CM | POA: Diagnosis not present

## 2022-09-29 DIAGNOSIS — I2510 Atherosclerotic heart disease of native coronary artery without angina pectoris: Secondary | ICD-10-CM

## 2022-09-29 DIAGNOSIS — I4819 Other persistent atrial fibrillation: Secondary | ICD-10-CM

## 2022-09-29 DIAGNOSIS — E782 Mixed hyperlipidemia: Secondary | ICD-10-CM | POA: Diagnosis not present

## 2022-09-29 MED ORDER — ROSUVASTATIN CALCIUM 40 MG PO TABS: 40.0000 mg | ORAL_TABLET | Freq: Every day | ORAL | 3 refills | Status: DC

## 2022-09-29 NOTE — Patient Instructions (Signed)
Medication Instructions:   STOP SIMVASTATIN WHEN CURRENT SUPPLY IS OUT THEN  START ROSUVASTATIN 40 MG ONCE DAILY  *If you need a refill on your cardiac medications before your next appointment, please call your pharmacy*   Lab Work:  Your physician recommends that you return for lab work in: [facility] on the 2 nd floor in [address] Hours- Mon-Thur 8 am-11:30 am and 1 pm - 4:30 pm             Fri-8am - 11 am   If you have labs (blood work) drawn today and your tests are completely normal, you will receive your results only by: [employer] (if you have MyChart) OR A paper copy in the mail If you have any lab test that is abnormal or we need to change your treatment, we will call you to review the results.   Follow-Up: At [facility], you and your health needs are our priority.  As part of our continuing mission to provide you with exceptional heart care, we have created designated Provider Care Teams.  These Care Teams include your primary Cardiologist (physician) and Advanced Practice Providers (APPs -  Physician Assistants and Nurse Practitioners) who all work together to provide you with the care you need, when you need it.  We recommend signing up for the patient portal called "MyChart".  Sign up information is provided on this After Visit Summary.  MyChart is used to connect with patients for Virtual Visits (Telemedicine).  Patients are able to view lab/test results, encounter notes, upcoming appointments, etc.  Non-urgent messages can be sent to your provider as well.   To learn more about what you can do with MyChart, go to [email or web address].    Your next appointment:   6 month(s)  The format for your next appointment:   In Person  Provider:   Kirk Ruths, MD

## 2022-09-30 ENCOUNTER — Ambulatory Visit (HOSPITAL_COMMUNITY): Payer: Medicare Other | Admitting: Nurse Practitioner

## 2022-10-02 ENCOUNTER — Ambulatory Visit (HOSPITAL_COMMUNITY): Payer: Medicare Other | Admitting: Nurse Practitioner

## 2022-10-03 ENCOUNTER — Ambulatory Visit (HOSPITAL_COMMUNITY)
Admission: RE | Admit: 2022-10-03 | Discharge: 2022-10-03 | Disposition: A | Discharge status: Home / Self Care | Payer: Medicare Other | Source: Ambulatory Visit | Attending: Nurse Practitioner | Admitting: Nurse Practitioner

## 2022-10-03 ENCOUNTER — Encounter (HOSPITAL_COMMUNITY): Payer: Self-pay | Admitting: Nurse Practitioner

## 2022-10-03 VITALS — BP 136/72 | HR 65 | Ht 72.0 in | Wt 219.6 lb

## 2022-10-03 DIAGNOSIS — Z7901 Long term (current) use of anticoagulants: Secondary | ICD-10-CM | POA: Insufficient documentation

## 2022-10-03 DIAGNOSIS — I4819 Other persistent atrial fibrillation: Secondary | ICD-10-CM

## 2022-10-03 DIAGNOSIS — D6869 Other thrombophilia: Secondary | ICD-10-CM

## 2022-10-03 DIAGNOSIS — I4892 Unspecified atrial flutter: Secondary | ICD-10-CM | POA: Insufficient documentation

## 2022-10-03 DIAGNOSIS — I10 Essential (primary) hypertension: Secondary | ICD-10-CM | POA: Insufficient documentation

## 2022-10-03 DIAGNOSIS — I4891 Unspecified atrial fibrillation: Secondary | ICD-10-CM

## 2022-10-03 NOTE — Progress Notes (Signed)
Primary Care Physician: Mosie Lukes, MD Referring Physician: Dr. Evalina Field is a 71 y.o. male with a h/o aflutter ablation x 2 with Dr. Rayann Heman, in 2016 and again in 09/2021. He was at his PCP's office recently and ekg showed afib. He was unaware but had noted some exertional dyspnea.  He was started on metoprolol and eliquis 5 mg bid for a CHA2DS2VASc  score of 2 by PCP 06/18/22, first full day of taking BID. He then saw Dr. Stanford Breed 9/18 and referred here. Recent normal TSH. He states at home that his HR's are in the 80'-90's at home.   F/u successful  cardioversion, 07/29/22. Unfortunately, ekg today shows afib rate controlled. He was unaware, states he feels fine. He does have an appointment with Dr. Myles Gip 10/24 at which time he can discuss afib ablation.   F/u in afib clinic, 10/04/22 for f/u ablation one month ago, 09/02/22. He has not noted any afib and is in SR today. He feels well. No swallowing or groin issues.   Today, he denies symptoms of palpitations, chest pain, shortness of breath, orthopnea, PND, lower extremity edema, dizziness, presyncope, syncope, or neurologic sequela. The patient is tolerating medications without difficulties and is otherwise without complaint today.   Past Medical History:  Diagnosis Date   Allergic rhinitis    Ankle fracture 1998   Atrial flutter ([place])    a. s/p CTI ablation by Dr Rayann Heman 11/2014   Back pain 08/05/2015   BPH (benign prostatic hyperplasia) 08/26/2015   Ganglion cyst of dorsum of right wrist 04/08/2017   GERD (gastroesophageal reflux disease)    Hammer toe of right foot 05/06/2013   History of carpal tunnel syndrome    Hyperlipidemia    Hypertension    Insect bite 04/23/2015   OA (osteoarthritis) 02/06/2013   knees   Pedal edema 04/08/2017   Preventative health care 04/27/2011   Had normal colonoscopy in 2011    Past Surgical History:  Procedure Laterality Date   A-FLUTTER ABLATION N/A 09/12/2021    Procedure: A-FLUTTER ABLATION;  Surgeon: Thompson Grayer, MD;  Location: [place] [facility];  Service: Cardiovascular;  Laterality: N/A;   ABLATION  11-24-14   CTI ablation by Dr Rayann Heman   ANKLE SURGERY     ATRIAL FIBRILLATION ABLATION N/A 09/02/2022   Procedure: ATRIAL FIBRILLATION ABLATION;  Surgeon: Melida Quitter, MD;  Location: [place] [facility];  Service: Cardiovascular;  Laterality: N/A;   ATRIAL FLUTTER ABLATION N/A 11/24/2014   Procedure: ATRIAL FLUTTER ABLATION;  Surgeon: Thompson Grayer, MD;  Location: [facility] CATH LAB;  Service: Cardiovascular;  Laterality: N/A;   CARDIOVERSION N/A 07/14/2022   Procedure: CARDIOVERSION;  Surgeon: Pixie Casino, MD;  Location: [facility] ENDOSCOPY;  Service: Cardiovascular;  Laterality: N/A;   Carpel tunnel     COLONOSCOPY  08/04/2002   Normal Exam- Dr Fransico Meadow HERNIA REPAIR  2008   TONSILLECTOMY      Current Outpatient Medications  Medication Sig Dispense Refill   amLODipine (NORVASC) 10 MG tablet TAKE ONE-HALF TABLET BY MOUTH  TWICE DAILY 100 tablet 2   Ascorbic Acid (VITAMIN C PO) Take 1 tablet by mouth daily.     Brimonidine Tartrate (LUMIFY) 0.025 % SOLN Place 1 drop into both eyes every morning.     Carboxymethylcellulose Sodium (LUBRICANT EYE DROPS OP) Place 1 drop into both eyes 2 (two) times daily as needed (Dry eye).     clindamycin (CLEOCIN T) 1 %  external solution Apply 1 Application topically 2 (two) times daily.     Clobetasol Propionate 0.05 % shampoo Apply 1 application topically daily as needed (irritation).     ELIQUIS 5 MG TABS tablet TAKE 1 TABLET BY MOUTH TWICE  DAILY 200 tablet 2   fexofenadine (ALLEGRA) 180 MG tablet Take 1 tablet (180 mg total) by mouth daily.     fluticasone (FLONASE) 50 MCG/ACT nasal spray Place 2 sprays into both nostrils daily as needed for allergies or rhinitis.  2   ketoconazole (NIZORAL) 2 % shampoo Apply 1 application topically daily as needed for irritation.     lisinopril-hydrochlorothiazide  (ZESTORETIC) 20-12.5 MG tablet TAKE 1 TABLET BY MOUTH DAILY 90 tablet 3   metoprolol succinate (TOPROL-XL) 50 MG 24 hr tablet Take 1 tablet (50 mg total) by mouth 2 (two) times daily. Take with or immediately following a meal. 180 tablet 1   Multiple Vitamin (MULTIVITAMIN) tablet Take 1 tablet by mouth every evening.     omeprazole (PRILOSEC) 20 MG capsule TAKE 1 CAPSULE BY MOUTH  DAILY 100 capsule 2   sildenafil (REVATIO) 20 MG tablet TAKE ONE TO FOUR TABLETS BY MOUTH DAILY AS NEEDED 90 tablet 1   simvastatin (ZOCOR) 20 MG tablet Take 20 mg by mouth daily.     tamsulosin (FLOMAX) 0.4 MG CAPS capsule TAKE 1 CAPSULE BY MOUTH  DAILY 100 capsule 2   VITAMIN D PO Take 1 capsule by mouth daily.     rosuvastatin (CRESTOR) 40 MG tablet Take 1 tablet (40 mg total) by mouth daily. (Patient not taking: Reported on 10/03/2022) 90 tablet 3   No current facility-administered medications for this encounter.    Allergies  Allergen Reactions   Penicillins Swelling    irriation    Social History   Socioeconomic History   Marital status: Married    Spouse name: Not on file   Number of children: 1   Years of education: Not on file   Highest education level: Not on file  Occupational History    Comment: Sales  Tobacco Use   Smoking status: Former   Smokeless tobacco: Never   Tobacco comments:    5-10 pack year history  Vaping Use   Vaping Use: Never used  Substance and Sexual Activity   Alcohol use: Yes    Comment: 2-3 glasses of wine per day   Drug use: No   Sexual activity: Yes  Other Topics Concern   Not on file  Social History Narrative   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, seed, erosion control)   Married- 47 years   21 daughter -Paramedic in   ([place]   Previous smoker - 5-10 pack yrs     Alcohol use-yes (2-3 glasses of wine)     Social Determinants of Health   Financial Resource Strain: Low Risk  (10/01/2021)   Overall Financial Resource Strain (CARDIA)     Difficulty of Paying Living Expenses: Not hard at all  Food Insecurity: No Food Insecurity (10/01/2021)   Hunger Vital Sign    Worried About Running Out of Food in the Last Year: Never true    [place] in the Last Year: Never true  Transportation Needs: No Transportation Needs (10/01/2021)   PRAPARE - Hydrologist (Medical): No    Lack of Transportation (Non-Medical): No  Physical Activity: Not on file  Stress: No Stress Concern Present (10/01/2021)   [place] -  Occupational Stress Questionnaire    Feeling of Stress : Not at all  Social Connections: [place] (10/01/2021)   Social Connection and Isolation Panel [NHANES]    Frequency of Communication with Friends and Family: More than three times a week    Frequency of Social Gatherings with Friends and Family: More than three times a week    Attends Religious Services: More than 4 times per year    Active Member of Clubs or Organizations: Yes    Attends Archivist Meetings: More than 4 times per year    Marital Status: Married  Human resources officer Violence: Not At Risk (10/01/2021)   Humiliation, Afraid, Rape, and Kick questionnaire    Fear of Current or Ex-Partner: No    Emotionally Abused: No    Physically Abused: No    Sexually Abused: No    Family History  Problem Relation Age of Onset   Arthritis Mother    Stroke Mother    Coronary artery disease Father        aortic valve disease   Heart attack Father 24   COPD Father    Hypertension Father    Hyperlipidemia Father    Dementia Father    Cancer Sister 74       breast cancer   Diabetes Sister    Diabetes type II Maternal Grandfather    Diabetes Maternal Grandfather    Hyperlipidemia Other    Hypertension Other     ROS- All systems are reviewed and negative except as per the HPI above  Physical Exam: Vitals:   10/03/22 1401  BP: 136/72  Pulse: 65  Weight: 99.6 kg   Height: 6' (1.829 m)   Wt Readings from Last 3 Encounters:  10/03/22 99.6 kg  09/29/22 97 kg  09/02/22 95.3 kg    Labs: Lab Results  Component Value Date   NA 138 09/17/2022   K 4.2 09/17/2022   CL 99 09/17/2022   CO2 32 09/17/2022   GLUCOSE 133 (H) 09/17/2022   BUN 16 09/17/2022   CREATININE 1.06 09/17/2022   CALCIUM 9.2 09/17/2022   PHOS 3.2 06/20/2014   MG 2.1 06/02/2019   No results found for: "INR" Lab Results  Component Value Date   CHOL 169 09/17/2022   HDL 56.90 09/17/2022   LDLCALC 77 09/17/2022   TRIG 177.0 (H) 09/17/2022     GEN- The patient is well appearing, alert and oriented x 3 today.   Head- normocephalic, atraumatic Eyes-  Sclera clear, conjunctiva pink Ears- hearing intact Oropharynx- clear Neck- supple, no JVP Lymph- no cervical lymphadenopathy Lungs- Clear to ausculation bilaterally, normal work of breathing Heart- irregular rate and rhythm, no murmurs, rubs or gallops, PMI not laterally displaced GI- soft, NT, ND, + BS Extremities- no clubbing, cyanosis, or edema MS- no significant deformity or atrophy Skin- no rash or lesion Psych- euthymic mood, full affect Neuro- strength and sensation are intact  EKG- Vent. rate  65 bpm,  PR interval 0.18 ms QRS duration 84 ms QT/QTcB 404 ms /420 ms P-R-T axes * 78 62  NSR  When compared with ECG of 14-Jul-2022 10:58, PREVIOUS ECG IS PRESENT    Assessment and Plan:   1. New onset afib Atrial flutter ablation x 2 in the past, 2016 and 12/22 Afib ablation 09/02/22 He is staying in SR   Continue metoprolol succinate 50 mg bid  2. CHA2DS2VASc  score of 2 Continue eliquis 5 mg bid without missed doses   3. HTN  Stable   F/u in with Dr. Myles Gip as scheduled  12/01/21  Geroge Baseman. Gareth Fitzner, [facility] [address] [place], [place] [address] [phone]

## 2022-10-10 ENCOUNTER — Ambulatory Visit (INDEPENDENT_AMBULATORY_CARE_PROVIDER_SITE_OTHER): Payer: Medicare Other | Admitting: *Deleted

## 2022-10-10 DIAGNOSIS — Z0000 Encounter for general adult medical examination without abnormal findings: Secondary | ICD-10-CM

## 2022-10-10 NOTE — Progress Notes (Signed)
Subjective:  Pt completed ADLs and Fall screen during echeck-in on 10/07/22.  Answers verified with pt.    William Phillips is a 71 y.o. male who presents for Medicare Annual/Subsequent preventive examination.  I connected with  William Phillips on 10/10/22 by a audio enabled telemedicine application and verified that I am speaking with the correct person using two identifiers.  Patient Location: Home  Provider Location: Office/Clinic  I discussed the limitations of evaluation and management by telemedicine. The patient expressed understanding and agreed to proceed.   Review of Systems    Defer to PCP Cardiac Risk Factors include: advanced age (>29mn, >>3women);male gender;dyslipidemia;hypertension     Objective:    There were no vitals filed for this visit. There is no height or weight on file to calculate BMI.     10/10/2022    8:22 AM 09/02/2022    6:52 AM 07/14/2022   10:08 AM 10/01/2021    8:23 AM 09/12/2021    9:06 AM 11/29/2019    8:45 AM 11/24/2014    6:14 AM  Advanced Directives  Does Patient Have a Medical Advance Directive? Yes Yes Yes  Yes Yes Yes  Type of AParamedicof [place]Living will H[place]Living will HH[place] CrossLiving will Living will HMarshalltownLiving will [place]att[place]Living will [place]ok[place]Living will  Does patient want to make changes to medical advance directive? No - Patient declined     No - Patient declined No - Patient declined  Copy of [place]in Chart? No - copy requested  No - copy requested  No - copy requested No - copy requested     Current Medications (verified) Outpatient Encounter Medications as of 10/10/2022  Medication Sig   amLODipine (NORVASC) 10 MG tablet TAKE ONE-HALF TABLET BY MOUTH  TWICE DAILY   Ascorbic Acid (VITAMIN C PO) Take 1 tablet by mouth daily.   Brimonidine Tartrate (LUMIFY)  0.025 % SOLN Place 1 drop into both eyes every morning.   Carboxymethylcellulose Sodium (LUBRICANT EYE DROPS OP) Place 1 drop into both eyes 2 (two) times daily as needed (Dry eye).   clindamycin (CLEOCIN T) 1 % external solution Apply 1 Application topically 2 (two) times daily.   Clobetasol Propionate 0.05 % shampoo Apply 1 application topically daily as needed (irritation).   ELIQUIS 5 MG TABS tablet TAKE 1 TABLET BY MOUTH TWICE  DAILY   fexofenadine (ALLEGRA) 180 MG tablet Take 1 tablet (180 mg total) by mouth daily.   fluticasone (FLONASE) 50 MCG/ACT nasal spray Place 2 sprays into both nostrils daily as needed for allergies or rhinitis.   ketoconazole (NIZORAL) 2 % shampoo Apply 1 application topically daily as needed for irritation.   lisinopril-hydrochlorothiazide (ZESTORETIC) 20-12.5 MG tablet TAKE 1 TABLET BY MOUTH DAILY   metoprolol succinate (TOPROL-XL) 50 MG 24 hr tablet Take 1 tablet (50 mg total) by mouth 2 (two) times daily. Take with or immediately following a meal.   Multiple Vitamin (MULTIVITAMIN) tablet Take 1 tablet by mouth every evening.   omeprazole (PRILOSEC) 20 MG capsule TAKE 1 CAPSULE BY MOUTH  DAILY   rosuvastatin (CRESTOR) 40 MG tablet Take 1 tablet (40 mg total) by mouth daily. (Patient not taking: Reported on 10/03/2022)   sildenafil (REVATIO) 20 MG tablet TAKE ONE TO FOUR TABLETS BY MOUTH DAILY AS NEEDED   simvastatin (ZOCOR) 20 MG tablet Take 20 mg by mouth daily.  tamsulosin (FLOMAX) 0.4 MG CAPS capsule TAKE 1 CAPSULE BY MOUTH  DAILY   VITAMIN D PO Take 1 capsule by mouth daily.   No facility-administered encounter medications on file as of 10/10/2022.    Allergies (verified) Penicillins   History: Past Medical History:  Diagnosis Date   Allergic rhinitis    Ankle fracture 1998   Atrial flutter ([place])    a. s/p CTI ablation by Dr Rayann Heman 11/2014   Back pain 08/05/2015   BPH (benign prostatic hyperplasia) 08/26/2015   Ganglion cyst of dorsum of right  wrist 04/08/2017   GERD (gastroesophageal reflux disease)    Hammer toe of right foot 05/06/2013   History of carpal tunnel syndrome    Hyperlipidemia    Hypertension    Insect bite 04/23/2015   OA (osteoarthritis) 02/06/2013   knees   Pedal edema 04/08/2017   Preventative health care 04/27/2011   Had normal colonoscopy in 2011    Past Surgical History:  Procedure Laterality Date   A-FLUTTER ABLATION N/A 09/12/2021   Procedure: A-FLUTTER ABLATION;  Surgeon: Thompson Grayer, MD;  Location: [facility];  Service: Cardiovascular;  Laterality: N/A;   ABLATION  11-24-14   CTI ablation by Dr Rayann Heman   ANKLE SURGERY     ATRIAL FIBRILLATION ABLATION N/A 09/02/2022   Procedure: ATRIAL FIBRILLATION ABLATION;  Surgeon: Melida Quitter, MD;  Location: [facility];  Service: Cardiovascular;  Laterality: N/A;   ATRIAL FLUTTER ABLATION N/A 11/24/2014   Procedure: ATRIAL FLUTTER ABLATION;  Surgeon: Thompson Grayer, MD;  Location[facility] C[facility] LAB;  Service: Cardiovascular;  Laterality: N/A;   CARDIOVERSION N/A 07/14/2022   Procedure: CARDIOVERSION;  Surgeon: Pixie Casino, MD;  Location: [facility] ENDOSCOPY;  Service: Cardiovascular;  Laterality: N/A;   Carpel tunnel     COLONOSCOPY  08/04/2002   Normal Exam- Dr Fransico Meadow HERNIA REPAIR  2008   TONSILLECTOMY     Family History  Problem Relation Age of Onset   Arthritis Mother    Stroke Mother    Coronary artery disease Father        aortic valve disease   Heart attack Father 48   COPD Father    Hypertension Father    Hyperlipidemia Father    Dementia Father    Cancer Sister 58       breast cancer   Diabetes Sister    Diabetes type II Maternal Grandfather    Diabetes Maternal Grandfather    Hyperlipidemia Other    Hypertension Other    Social History   Socioeconomic History   Marital status: Married    Spouse name: Not on file   Number of children: 1   Years of education: Not on file   Highest education level: Not on file   Occupational History    Comment: Sales  Tobacco Use   Smoking status: Former   Smokeless tobacco: Never   Tobacco comments:    5-10 pack year history  Vaping Use   Vaping Use: Never used  Substance and Sexual Activity   Alcohol use: Yes    Alcohol/week: 10.0 standard drinks of alcohol    Types: 10 Glasses of wine per week    Comment: 1-2 drinks daily (wine)   Drug use: No   Sexual activity: Yes  Other Topics Concern   Not on file  Social History Narrative   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, seed, erosion control)   Married- 93 years   78 daughter -  junior in   ([place]   Previous smoker - 5-10 pack yrs     Alcohol use-yes (2-3 glasses of wine)     Social Determinants of Health   Financial Resource Strain: Low Risk  (10/01/2021)   Overall Financial Resource Strain (CARDIA)    Difficulty of Paying Living Expenses: Not hard at all  Food Insecurity: No Food Insecurity (10/01/2021)   Hunger Vital Sign    Worried About Running Out of Food in the Last Year: Never true    Ran Out of Food in the Last Year: Never true  Transportation Needs: No Transportation Needs (10/01/2021)   PRAPARE - Hydrologist (Medical): No    Lack of Transportation (Non-Medical): No  Physical Activity: Not on file  Stress: No Stress Concern Present (10/01/2021)   [place]    Feeling of Stress : Not at all  Social Connections: [place] (10/01/2021)   Social Connection and Isolation Panel [NHANES]    Frequency of Communication with Friends and Family: More than three times a week    Frequency of Social Gatherings with Friends and Family: More than three times a week    Attends Religious Services: More than 4 times per year    Active Member of [employer] or Organizations: Yes    Attends Music therapist: More than 4 times per year    Marital Status: Married     Tobacco Counseling Counseling given: Not Answered Tobacco comments: 5-10 pack year history   Clinical Intake:  Pre-visit preparation completed: Yes  Pain : No/denies pain  Diabetes: No  How often do you need to have someone help you when you read instructions, pamphlets, or other written materials from your doctor or pharmacy?: 1 - Never   Activities of Daily Living    10/07/2022    2:59 PM  In your present state of health, do you have any difficulty performing the following activities:  Hearing? 0  Vision? 0  Difficulty concentrating or making decisions? 0  Walking or climbing stairs? 0  Dressing or bathing? 0  Doing errands, shopping? 0  Preparing Food and eating ? N  Using the Toilet? N  In the past six months, have you accidently leaked urine? N  Do you have problems with loss of bowel control? N  Managing your Medications? N  Managing your Finances? N  Housekeeping or managing your Housekeeping? N    Patient Care Team: Mosie Lukes, MD as PCP - General (Family Medicine) Mealor, Yetta Barre, MD as PCP - Electrophysiology (Cardiology) Stanford Breed Denice Bors, MD as PCP - Cardiology (Cardiology)  Indicate any recent Medical Services you may have received from other than [facility] providers in the past year (date may be approximate).     Assessment:   This is a routine wellness examination for [place].  Hearing/Vision screen No results found.  Dietary issues and exercise activities discussed: Current Exercise Habits: Home exercise routine, Type of exercise: walking, Time (Minutes): 45, Frequency (Times/Week): 5, Weekly Exercise (Minutes/Week): 225, Intensity: Mild, Exercise limited by: None identified   Goals Addressed   None    Depression Screen    10/10/2022    8:23 AM 06/17/2022   10:55 AM 10/01/2021    8:29 AM 04/29/2021   10:59 AM 04/02/2021   11:52 AM 11/29/2019    8:48 AM 11/05/2017    9:43 AM  PHQ 2/9 Scores  PHQ - 2 Score 0  0 0 0 0 0 0  PHQ- 9  Score  0  0       Fall Risk    10/07/2022    2:59 PM 06/17/2022   10:55 AM 10/01/2021    8:27 AM 04/29/2021   10:59 AM 04/02/2021   11:52 AM  Fall Risk   Falls in the past year? 0 0  0 0  Number falls in past yr: 0 0 0 0 0  Injury with Fall? 0 0 0 0 0  Risk for fall due to : No Fall Risks   No Fall Risks   Follow up Falls evaluation completed  Falls prevention discussed Falls evaluation completed Falls evaluation completed    FALL RISK PREVENTION PERTAINING TO THE HOME:  Any stairs in or around the home? Yes  If so, are there any without handrails? No  Home free of loose throw rugs in walkways, pet beds, electrical cords, etc? Yes  Adequate lighting in your home to reduce risk of falls? Yes   ASSISTIVE DEVICES UTILIZED TO PREVENT FALLS:  Life alert? No  Use of a cane, walker or w/c? No  Grab bars in the bathroom? No  Shower chair or bench in shower? Yes  Elevated toilet seat or a handicapped toilet? No   TIMED UP AND GO:  Was the test performed?  No, audio visit .    Cognitive Function:        10/10/2022    8:27 AM  6CIT Screen  What Year? 0 points  What month? 0 points  What time? 0 points  Count back from 20 0 points  Months in reverse 0 points  Repeat phrase 0 points  Total Score 0 points    Immunizations Immunization History  Administered Date(s) Administered   Fluad Quad(high Dose 65+) 06/29/2019   Influenza Whole 07/25/2009   Influenza, High Dose Seasonal PF 07/29/2017   Influenza,inj,Quad PF,6+ Mos 07/24/2015, 07/18/2016, 06/29/2019, 10/11/2020   Influenza-Unspecified 08/13/2018   Moderna SARS-COV2 Booster Vaccination 10/23/2020   Moderna Sars-Covid-2 Vaccination 01/01/2020, 01/29/2020   Pneumococcal Conjugate-13 11/02/2013   Pneumococcal Polysaccharide-23 11/05/2017   Td 01/07/2005   Tdap 08/24/2015   Zoster Recombinat (Shingrix) 10/11/2020, 12/27/2020   Zoster, Live 02/04/2013    TDAP status: Up to date  Flu Vaccine status: Due, Education  has been provided regarding the importance of this vaccine. Advised may receive this vaccine at local pharmacy or Health Dept. Aware to provide a copy of the vaccination record if obtained from local pharmacy or Health Dept. Verbalized acceptance and understanding.  Pneumococcal vaccine status: Up to date  Covid-19 vaccine status: Information provided on how to obtain vaccines.   Qualifies for Shingles Vaccine? Yes   Zostavax completed Yes   Shingrix Completed?: Yes  Screening Tests Health Maintenance  Topic Date Due   Medicare Annual Wellness (AWV)  10/01/2022   COVID-19 Vaccine (3 - Moderna risk series) 10/10/2022 (Originally 11/20/2020)   INFLUENZA VACCINE  01/11/2023 (Originally 05/13/2022)   Fecal DNA (Cologuard)  10/16/2024   DTaP/Tdap/Td (3 - Td or Tdap) 08/23/2025   Pneumonia Vaccine 32+ Years old  Completed   Hepatitis C Screening  Completed   Zoster Vaccines- Shingrix  Completed   HPV VACCINES  Aged Out   COLONOSCOPY (Pts 45-45yr Insurance coverage will need to be confirmed)  Discontinued    Health Maintenance  Health Maintenance Due  Topic Date Due   Medicare Annual Wellness (AWV)  10/01/2022    Colorectal cancer screening: Type of screening:  Cologuard. Completed 10/16/21. Repeat every 3 years  Lung Cancer Screening: (Low Dose CT Chest recommended if Age 73-80 years, 30 pack-year currently smoking OR have quit w/in 15years.) does not qualify.   Additional Screening:  Hepatitis C Screening: does qualify; Completed 05/26/16  Vision Screening: Recommended annual ophthalmology exams for early detection of glaucoma and other disorders of the eye. Is the patient up to date with their annual eye exam?  Yes  Who is the provider or what is the name of the office in which the patient attends annual eye exams? Dr. [facility] If pt is not established with a provider, would they like to be referred to a provider to establish care? No .   Dental Screening:  Recommended annual dental exams for proper oral hygiene  Community Resource Referral / Chronic Care Management: CRR required this visit?  No   CCM required this visit?  No      Plan:     I have personally reviewed and noted the following in the patient's chart:   Medical and social history Use of alcohol, tobacco or illicit drugs  Current medications and supplements including opioid prescriptions. Patient is not currently taking opioid prescriptions. Functional ability and status Nutritional status Physical activity Advanced directives List of other physicians Hospitalizations, surgeries, and ER visits in previous 12 months Vitals Screenings to include cognitive, depression, and falls Referrals and appointments  In addition, I have reviewed and discussed with patient certain preventive protocols, quality metrics, and best practice recommendations. A written personalized care plan for preventive services as well as general preventive health recommendations were provided to patient.   Due to this being a telephonic visit, the after visit summary with patients personalized plan was offered to patient via mail or my-chart.  Patient would like to access on my-chart.  Beatris Ship, [place]   10/10/2022   Nurse Notes: None

## 2022-10-10 NOTE — Patient Instructions (Signed)
William Phillips , Thank you for taking time to come for your Medicare Wellness Visit. I appreciate your ongoing commitment to your health goals. Please review the following plan we discussed and let me know if I can assist you in the future.   These are the goals we discussed:  Goals      Maintain healthy active lifestyle.        This is a list of the screening recommended for you and due dates:  Health Maintenance  Topic Date Due   COVID-19 Vaccine (3 - Moderna risk series) 10/10/2022*   Flu Shot  01/11/2023*   Medicare Annual Wellness Visit  10/11/2023   Cologuard (Stool DNA test)  10/16/2024   DTaP/Tdap/Td vaccine (3 - Td or Tdap) 08/23/2025   Pneumonia Vaccine  Completed   Hepatitis C Screening: USPSTF Recommendation to screen - Ages 66-79 yo.  Completed   Zoster (Shingles) Vaccine  Completed   HPV Vaccine  Aged Out   Colon Cancer Screening  Discontinued  *Topic was postponed. The date shown is not the original due date.     Next appointment: Follow up in one year for your annual wellness visit. 10/13/23 8:20am  Preventive Care 65 Years and Older, Male Preventive care refers to lifestyle choices and visits with your health care provider that can promote health and wellness. What does preventive care include? A yearly physical exam. This is also called an annual well check. Dental exams once or twice a year. Routine eye exams. Ask your health care provider how often you should have your eyes checked. Personal lifestyle choices, including: Daily care of your teeth and gums. Regular physical activity. Eating a healthy diet. Avoiding tobacco and drug use. Limiting alcohol use. Practicing safe sex. Taking low doses of aspirin every day. Taking vitamin and mineral supplements as recommended by your health care provider. What happens during an annual well check? The services and screenings done by your health care provider during your annual well check will depend on your age,  overall health, lifestyle risk factors, and family history of disease. Counseling  Your health care provider may ask you questions about your: Alcohol use. Tobacco use. Drug use. Emotional well-being. Home and relationship well-being. Sexual activity. Eating habits. History of falls. Memory and ability to understand (cognition). Work and work Statistician. Screening  You may have the following tests or measurements: Height, weight, and BMI. Blood pressure. Lipid and cholesterol levels. These may be checked every 5 years, or more frequently if you are over 34 years old. Skin check. Lung cancer screening. You may have this screening every year starting at age 28 if you have a 30-pack-year history of smoking and currently smoke or have quit within the past 15 years. Fecal occult blood test (FOBT) of the stool. You may have this test every year starting at age 22. Flexible sigmoidoscopy or colonoscopy. You may have a sigmoidoscopy every 5 years or a colonoscopy every 10 years starting at age 81. Prostate cancer screening. Recommendations will vary depending on your family history and other risks. Hepatitis C blood test. Hepatitis B blood test. Sexually transmitted disease (STD) testing. Diabetes screening. This is done by checking your blood sugar (glucose) after you have not eaten for a while (fasting). You may have this done every 1-3 years. Abdominal aortic aneurysm (AAA) screening. You may need this if you are a current or former smoker. Osteoporosis. You may be screened starting at age 72 if you are at high risk. Talk with  your health care provider about your test results, treatment options, and if necessary, the need for more tests. Vaccines  Your health care provider may recommend certain vaccines, such as: Influenza vaccine. This is recommended every year. Tetanus, diphtheria, and acellular pertussis (Tdap, Td) vaccine. You may need a Td booster every 10 years. Zoster vaccine.  You may need this after age 74. Pneumococcal 13-valent conjugate (PCV13) vaccine. One dose is recommended after age 43. Pneumococcal polysaccharide (PPSV23) vaccine. One dose is recommended after age 71. Talk to your health care provider about which screenings and vaccines you need and how often you need them. This information is not intended to replace advice given to you by your health care provider. Make sure you discuss any questions you have with your health care provider. Document Released: 10/26/2015 Document Revised: 06/18/2016 Document Reviewed: 07/31/2015 Elsevier Interactive Patient Education  2017 [place] Prevention in the Home Falls can cause injuries. They can happen to people of all ages. There are many things you can do to make your home safe and to help prevent falls. What can I do on the outside of my home? Regularly fix the edges of walkways and driveways and fix any cracks. Remove anything that might make you trip as you walk through a door, such as a raised step or threshold. Trim any bushes or trees on the path to your home. Use bright outdoor lighting. Clear any walking paths of anything that might make someone trip, such as rocks or tools. Regularly check to see if handrails are loose or broken. Make sure that both sides of any steps have handrails. Any raised decks and porches should have guardrails on the edges. Have any leaves, snow, or ice cleared regularly. Use sand or salt on walking paths during winter. Clean up any spills in your garage right away. This includes oil or grease spills. What can I do in the bathroom? Use night lights. Install grab bars by the toilet and in the tub and shower. Do not use towel bars as grab bars. Use non-skid mats or decals in the tub or shower. If you need to sit down in the shower, use a plastic, non-slip stool. Keep the floor dry. Clean up any water that spills on the floor as soon as it happens. Remove soap  buildup in the tub or shower regularly. Attach bath mats securely with double-sided non-slip rug tape. Do not have throw rugs and other things on the floor that can make you trip. What can I do in the bedroom? Use night lights. Make sure that you have a light by your bed that is easy to reach. Do not use any sheets or blankets that are too big for your bed. They should not hang down onto the floor. Have a firm chair that has side arms. You can use this for support while you get dressed. Do not have throw rugs and other things on the floor that can make you trip. What can I do in the kitchen? Clean up any spills right away. Avoid walking on wet floors. Keep items that you use a lot in easy-to-reach places. If you need to reach something above you, use a strong step stool that has a grab bar. Keep electrical cords out of the way. Do not use floor polish or wax that makes floors slippery. If you must use wax, use non-skid floor wax. Do not have throw rugs and other things on the floor that can make you trip.  What can I do with my stairs? Do not leave any items on the stairs. Make sure that there are handrails on both sides of the stairs and use them. Fix handrails that are broken or loose. Make sure that handrails are as long as the stairways. Check any carpeting to make sure that it is firmly attached to the stairs. Fix any carpet that is loose or worn. Avoid having throw rugs at the top or bottom of the stairs. If you do have throw rugs, attach them to the floor with carpet tape. Make sure that you have a light switch at the top of the stairs and the bottom of the stairs. If you do not have them, ask someone to add them for you. What else can I do to help prevent falls? Wear shoes that: Do not have high heels. Have rubber bottoms. Are comfortable and fit you well. Are closed at the toe. Do not wear sandals. If you use a stepladder: Make sure that it is fully opened. Do not climb a closed  stepladder. Make sure that both sides of the stepladder are locked into place. Ask someone to hold it for you, if possible. Clearly mark and make sure that you can see: Any grab bars or handrails. First and last steps. Where the edge of each step is. Use tools that help you move around (mobility aids) if they are needed. These include: Canes. Walkers. Scooters. Crutches. Turn on the lights when you go into a dark area. Replace any light bulbs as soon as they burn out. Set up your furniture so you have a clear path. Avoid moving your furniture around. If any of your floors are uneven, fix them. If there are any pets around you, be aware of where they are. Review your medicines with your doctor. Some medicines can make you feel dizzy. This can increase your chance of falling. Ask your doctor what other things that you can do to help prevent falls. This information is not intended to replace advice given to you by your health care provider. Make sure you discuss any questions you have with your health care provider. Document Released: 07/26/2009 Document Revised: 03/06/2016 Document Reviewed: 11/03/2014 Elsevier Interactive Patient Education  2017 [employer].

## 2022-10-24 ENCOUNTER — Other Ambulatory Visit: Payer: Self-pay | Admitting: Family Medicine

## 2022-10-28 DIAGNOSIS — H52203 Unspecified astigmatism, bilateral: Secondary | ICD-10-CM | POA: Diagnosis not present

## 2022-10-28 DIAGNOSIS — H524 Presbyopia: Secondary | ICD-10-CM | POA: Diagnosis not present

## 2022-10-28 DIAGNOSIS — H2513 Age-related nuclear cataract, bilateral: Secondary | ICD-10-CM | POA: Diagnosis not present

## 2022-10-28 DIAGNOSIS — H25013 Cortical age-related cataract, bilateral: Secondary | ICD-10-CM | POA: Diagnosis not present

## 2022-10-28 DIAGNOSIS — H43813 Vitreous degeneration, bilateral: Secondary | ICD-10-CM | POA: Diagnosis not present

## 2022-10-28 DIAGNOSIS — D3131 Benign neoplasm of right choroid: Secondary | ICD-10-CM | POA: Diagnosis not present

## 2022-10-29 ENCOUNTER — Other Ambulatory Visit: Payer: Self-pay | Admitting: Family Medicine

## 2022-10-30 LAB — HM DIABETES EYE EXAM

## 2022-11-20 ENCOUNTER — Encounter (HOSPITAL_COMMUNITY): Payer: Self-pay | Admitting: *Deleted

## 2022-12-01 ENCOUNTER — Ambulatory Visit: Payer: Medicare Other | Attending: Cardiovascular Disease | Admitting: Cardiovascular Disease

## 2022-12-01 ENCOUNTER — Encounter: Payer: Self-pay | Admitting: Cardiovascular Disease

## 2022-12-01 VITALS — BP 124/70 | HR 60 | Ht 72.0 in | Wt 215.6 lb

## 2022-12-01 DIAGNOSIS — I4819 Other persistent atrial fibrillation: Secondary | ICD-10-CM | POA: Diagnosis not present

## 2022-12-01 NOTE — Progress Notes (Signed)
Cardiology Office Note:    Date:  12/01/2022   ID:  William Phillips, DOB March 13, 1951, MRN [id number]  PCP:  Mosie Lukes, MD   [place] [facility] Cardiologist:  Kirk Ruths, MD Electrophysiologist:  Melida Quitter, MD     Referring MD: Mosie Lukes, MD   Chief complaint: fatigue  History of Present Illness:    William Phillips is a 72 y.o. male with a hx of aflutter ablation x 2 with Dr. Rayann Heman, in 2016 and again in 09/2021.  Atrial fibrillation  was recently detected by his PCP.  The patient had noticed some increased exertional dyspnea.  Eliquis -- which has been discontinued -- and metoprolol were restarted.  He underwent DC cardioversion on July 14, 2022, but was again noted to be in atrial fibrillation in follow-up October 17.  He reports that early in the summer, he was frequently going on walks with his wife.  At some point he noticed a decrease in his energy levels.  He lost interest in going to walks and began to take naps in the late afternoon and early evenings.  This appeared to correlate with onset of his atrial fibrillation.  He underwent repeat ablation for AF in November 2023. Since, he reports that he has not had any symptoms to suggest recurrence of atrial fibrillation.  He has no complaints today--no palpitations, shortness of breath, presyncope, chest pain.  Past Medical History:  Diagnosis Date   Allergic rhinitis    Ankle fracture 1998   Atrial flutter ([place])    a. s/p CTI ablation by Dr Rayann Heman 11/2014   Back pain 08/05/2015   BPH (benign prostatic hyperplasia) 08/26/2015   Ganglion cyst of dorsum of right wrist 04/08/2017   GERD (gastroesophageal reflux disease)    Hammer toe of right foot 05/06/2013   History of carpal tunnel syndrome    Hyperlipidemia    Hypertension    Insect bite 04/23/2015   OA (osteoarthritis) 02/06/2013   knees   Pedal edema 04/08/2017   Preventative health care 04/27/2011   Had normal colonoscopy  in 2011     Past Surgical History:  Procedure Laterality Date   A-FLUTTER ABLATION N/A 09/12/2021   Procedure: A-FLUTTER ABLATION;  Surgeon: Thompson Grayer, MD;  Location: [facility];  Service: Cardiovascular;  Laterality: N/A;   ABLATION  11-24-14   CTI ablation by Dr Rayann Heman   ANKLE SURGERY     ATRIAL FIBRILLATION ABLATION N/A 09/02/2022   Procedure: ATRIAL FIBRILLATION ABLATION;  Surgeon: Melida Quitter, MD;  Location: [facility];  Service: Cardiovascular;  Laterality: N/A;   ATRIAL FLUTTER ABLATION N/A 11/24/2014   Procedure: ATRIAL FLUTTER ABLATION;  Surgeon: Thompson Grayer, MD;  Location[facility] CATH LAB;  Service: Cardiovascular;  Laterality: N/A;   CARDIOVERSION N/A 07/14/2022   Procedure: CARDIOVERSION;  Surgeon: Pixie Casino, MD;  Location: [facility] ENDOSCOPY;  Service: Cardiovascular;  Laterality: N/A;   Carpel tunnel     COLONOSCOPY  08/04/2002   Normal Exam- Dr Fransico Meadow HERNIA REPAIR  2008   TONSILLECTOMY      Current Medications: Current Meds  Medication Sig   amLODipine (NORVASC) 10 MG tablet TAKE ONE-HALF TABLET BY MOUTH  TWICE DAILY   Ascorbic Acid (VITAMIN C PO) Take 1 tablet by mouth daily.   Brimonidine Tartrate (LUMIFY) 0.025 % SOLN Place 1 drop into both eyes every morning.   Carboxymethylcellulose Sodium (LUBRICANT EYE DROPS OP) Place 1 drop into both  eyes 2 (two) times daily as needed (Dry eye).   clindamycin (CLEOCIN T) 1 % external solution Apply 1 Application topically 2 (two) times daily.   Clobetasol Propionate 0.05 % shampoo Apply 1 application topically daily as needed (irritation).   ELIQUIS 5 MG TABS tablet TAKE 1 TABLET BY MOUTH TWICE  DAILY   fexofenadine (ALLEGRA) 180 MG tablet Take 1 tablet (180 mg total) by mouth daily.   fluticasone (FLONASE) 50 MCG/ACT nasal spray Place 2 sprays into both nostrils daily as needed for allergies or rhinitis.   ketoconazole (NIZORAL) 2 % shampoo Apply 1 application topically daily as needed for  irritation.   lisinopril-hydrochlorothiazide (ZESTORETIC) 20-12.5 MG tablet TAKE 1 TABLET BY MOUTH DAILY   metoprolol succinate (TOPROL-XL) 50 MG 24 hr tablet TAKE 1 TABLET BY MOUTH TWICE  DAILY WITH OR IMMEDIATELY  FOLLOWING A MEAL   Multiple Vitamin (MULTIVITAMIN) tablet Take 1 tablet by mouth every evening.   omeprazole (PRILOSEC) 20 MG capsule TAKE 1 CAPSULE BY MOUTH DAILY   rosuvastatin (CRESTOR) 40 MG tablet Take 1 tablet (40 mg total) by mouth daily.   sildenafil (REVATIO) 20 MG tablet TAKE ONE TO FOUR TABLETS BY MOUTH DAILY AS NEEDED   tamsulosin (FLOMAX) 0.4 MG CAPS capsule TAKE 1 CAPSULE BY MOUTH DAILY   VITAMIN D PO Take 1 capsule by mouth daily.     Allergies:   Penicillins   Social History   Socioeconomic History   Marital status: Married    Spouse name: Not on file   Number of children: 1   Years of education: Not on file   Highest education level: Not on file  Occupational History    Comment: Sales  Tobacco Use   Smoking status: Former   Smokeless tobacco: Never   Tobacco comments:    5-10 pack year history  Vaping Use   Vaping Use: Never used  Substance and Sexual Activity   Alcohol use: Yes    Alcohol/week: 10.0 standard drinks of alcohol    Types: 10 Glasses of wine per week    Comment: 1-2 drinks daily (wine)   Drug use: No   Sexual activity: Yes  Other Topics Concern   Not on file  Social History Narrative   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, seed, erosion control)   Married- 57 years   21 daughter -Paramedic in   ([place]   Previous smoker - 5-10 pack yrs     Alcohol use-yes (2-3 glasses of wine)     Social Determinants of Health   Financial Resource Strain: Low Risk  (10/01/2021)   Overall Financial Resource Strain (CARDIA)    Difficulty of Paying Living Expenses: Not hard at all  Food Insecurity: No Food Insecurity (10/01/2021)   Hunger Vital Sign    Worried About Running Out of Food in the Last Year: Never true    Ran  Out of Food in the Last Year: Never true  Transportation Needs: No Transportation Needs (10/01/2021)   PRAPARE - Hydrologist (Medical): No    Lack of Transportation (Non-Medical): No  Physical Activity: Not on file  Stress: No Stress Concern Present (10/01/2021)   [place]    Feeling of Stress : Not at all  Social Connections: [place] (10/01/2021)   Social Connection and Isolation Panel [NHANES]    Frequency of Communication with Friends and Family: More than three times a week  Frequency of Social Gatherings with Friends and Family: More than three times a week    Attends Religious Services: More than 4 times per year    Active Member of [employer] or Organizations: Yes    Attends Music therapist: More than 4 times per year    Marital Status: Married     Family History: The patient's family history includes Arthritis in his mother; COPD in his father; Cancer (age of onset: 35) in his sister; Coronary artery disease in his father; Dementia in his father; Diabetes in his maternal grandfather and sister; Diabetes type II in his maternal grandfather; Heart attack (age of onset: 50) in his father; Hyperlipidemia in his father and another family member; Hypertension in his father and another family member; Stroke in his mother.  ROS:   Please see the history of present illness.    All other systems reviewed and are negative.  EKGs/Labs/Other Studies Reviewed:     TTE 06/21/2021  1. Left ventricular ejection fraction, by estimation, is 50 to 55%. The  left ventricle has low normal function. The left ventricle has no regional  wall motion abnormalities. Left ventricular diastolic parameters are  consistent with Grade I diastolic  dysfunction (impaired relaxation).   2. Right ventricular systolic function is normal. The right ventricular  size is normal.   3. The mitral  valve is normal in structure. No evidence of mitral valve  regurgitation. No evidence of mitral stenosis.   4. The aortic valve is normal in structure. Aortic valve regurgitation is  trivial. Mild aortic valve sclerosis is present, with no evidence of  aortic valve stenosis.   5. The inferior vena cava is normal in size with greater than 50%  respiratory variability, suggesting right atrial pressure of 3 mmHg.    EKG:  Last EKG results: today - sinus rhythm with PACs    Recent Labs: 09/17/2022: ALT 18; BUN 16; Creatinine, Ser 1.06; Hemoglobin 15.4; Platelets 230.0; Potassium 4.2; Sodium 138; TSH 1.87         Physical Exam:    VS:  BP 124/70   Pulse 60   Ht 6' (1.829 m)   Wt 215 lb 9.6 oz (97.8 kg)   SpO2 99%   BMI 29.24 kg/m     Wt Readings from Last 3 Encounters:  12/01/22 215 lb 9.6 oz (97.8 kg)  10/03/22 219 lb 9.6 oz (99.6 kg)  09/29/22 213 lb 12.8 oz (97 kg)     GEN: Well nourished, well developed in no acute distress CARDIAC: irregular, no murmurs, rubs, gallops RESPIRATORY:  Normal work of breathing MUSCULOSKELETAL: no edema    ASSESSMENT & PLAN:    Atrial fibrillation: was symptomatic despite rate control. Now maintaining sinus rhythm after ablation. Continue metoprolol XL 50m daily  Typical atrial flutter: appears to be controlled after ablation #2 Secondary hypercoagulable state: Continue Eliquis 558mPO BID       Follow-up 1 year   Medication Adjustments/Labs and Tests Ordered: Current medicines are reviewed at length with the patient today.  Concerns regarding medicines are outlined above.  No orders of the defined types were placed in this encounter.  No orders of the defined types were placed in this encounter.    Signed, AuMelida QuitterMD  12/01/2022 11:46 AM    [place]

## 2022-12-01 NOTE — Patient Instructions (Signed)
Medication Instructions:  Your physician recommends that you continue on your current medications as directed. Please refer to the Current Medication list given to you today.  *If you need a refill on your cardiac medications before your next appointment, please call your pharmacy*  Lab Work: None ordered  Testing/Procedures: None ordered  Follow-Up: At [facility], you and your health needs are our priority.  As part of our continuing mission to provide you with exceptional heart care, we have created designated Provider Care Teams.  These Care Teams include your primary Cardiologist (physician) and Advanced Practice Providers (APPs -  Physician Assistants and Nurse Practitioners) who all work together to provide you with the care you need, when you need it.    Your next appointment:   1 year(s)  The format for your next appointment:   In Person  Provider:   You may see Melida Quitter, MD or one of the following Advanced Practice Providers on your designated Care Team:   Tommye Standard, [place] Legrand Como "Kamas" Hazel, PA-C [place], [place]

## 2022-12-16 ENCOUNTER — Ambulatory Visit (INDEPENDENT_AMBULATORY_CARE_PROVIDER_SITE_OTHER): Payer: Medicare Other | Admitting: Family Medicine

## 2022-12-16 VITALS — BP 110/68 | HR 76 | Temp 97.8°F | Resp 16 | Ht 72.0 in | Wt 210.0 lb

## 2022-12-16 DIAGNOSIS — M1990 Unspecified osteoarthritis, unspecified site: Secondary | ICD-10-CM

## 2022-12-16 DIAGNOSIS — R739 Hyperglycemia, unspecified: Secondary | ICD-10-CM

## 2022-12-16 DIAGNOSIS — I10 Essential (primary) hypertension: Secondary | ICD-10-CM

## 2022-12-16 DIAGNOSIS — R972 Elevated prostate specific antigen [PSA]: Secondary | ICD-10-CM | POA: Diagnosis not present

## 2022-12-16 DIAGNOSIS — I4819 Other persistent atrial fibrillation: Secondary | ICD-10-CM

## 2022-12-16 DIAGNOSIS — Z23 Encounter for immunization: Secondary | ICD-10-CM

## 2022-12-16 DIAGNOSIS — R351 Nocturia: Secondary | ICD-10-CM

## 2022-12-16 DIAGNOSIS — E782 Mixed hyperlipidemia: Secondary | ICD-10-CM | POA: Diagnosis not present

## 2022-12-16 DIAGNOSIS — N401 Enlarged prostate with lower urinary tract symptoms: Secondary | ICD-10-CM

## 2022-12-16 LAB — COMPREHENSIVE METABOLIC PANEL
ALT: 19 U/L (ref 0–53)
AST: 22 U/L (ref 0–37)
Albumin: 4.2 g/dL (ref 3.5–5.2)
Alkaline Phosphatase: 53 U/L (ref 39–117)
BUN: 12 mg/dL (ref 6–23)
CO2: 29 mEq/L (ref 19–32)
Calcium: 9.5 mg/dL (ref 8.4–10.5)
Chloride: 98 mEq/L (ref 96–112)
Creatinine, Ser: 0.78 mg/dL (ref 0.40–1.50)
GFR: 90.25 mL/min (ref >60.00)
Glucose, Bld: 118 mg/dL — ABNORMAL HIGH (ref 70–99)
Potassium: 4.3 mEq/L (ref 3.5–5.1)
Sodium: 136 mEq/L (ref 135–145)
Total Bilirubin: 1 mg/dL (ref 0.2–1.2)
Total Protein: 6.9 g/dL (ref 6.0–8.3)

## 2022-12-16 LAB — CBC WITH DIFFERENTIAL/PLATELET
Basophils Absolute: 0.1 10*3/uL (ref 0.0–0.1)
Basophils Relative: 0.7 % (ref 0.0–3.0)
Eosinophils Absolute: 0.3 10*3/uL (ref 0.0–0.7)
Eosinophils Relative: 3.8 % (ref 0.0–5.0)
HCT: 44.7 % (ref 39.0–52.0)
Hemoglobin: 14.9 g/dL (ref 13.0–17.0)
Lymphocytes Relative: 25.9 % (ref 12.0–46.0)
Lymphs Abs: 1.9 10*3/uL (ref 0.7–4.0)
MCHC: 33.4 g/dL (ref 30.0–36.0)
MCV: 98.2 fl (ref 78.0–100.0)
Monocytes Absolute: 0.9 10*3/uL (ref 0.1–1.0)
Monocytes Relative: 11.7 % (ref 3.0–12.0)
Neutro Abs: 4.3 10*3/uL (ref 1.4–7.7)
Neutrophils Relative %: 57.9 % (ref 43.0–77.0)
Platelets: 217 10*3/uL (ref 150.0–400.0)
RBC: 4.55 Mil/uL (ref 4.22–5.81)
RDW: 12.8 % (ref 11.5–15.5)
WBC: 7.5 10*3/uL (ref 4.0–10.5)

## 2022-12-16 LAB — TSH: TSH: 1.34 u[IU]/mL (ref 0.35–5.50)

## 2022-12-16 LAB — LIPID PANEL
Cholesterol: 135 mg/dL (ref 0–200)
HDL: 59.1 mg/dL (ref >39.00)
LDL Cholesterol: 53 mg/dL (ref 0–99)
NonHDL: 76.18
Total CHOL/HDL Ratio: 2
Triglycerides: 115 mg/dL (ref 0.0–149.0)
VLDL: 23 mg/dL (ref 0.0–40.0)

## 2022-12-16 LAB — HEMOGLOBIN A1C: Hgb A1c MFr Bld: 6.2 % (ref 4.6–6.5)

## 2022-12-16 LAB — PSA: PSA: 4.18 ng/mL — ABNORMAL HIGH (ref 0.10–4.00)

## 2022-12-16 NOTE — Assessment & Plan Note (Addendum)
S/p ablation and doing great. Is now going to follow with cardiology annually and as needed. Tolerating Eliquis

## 2022-12-16 NOTE — Patient Instructions (Addendum)
Prevnar 20 once today  RSV, Respiratory Syncitial Virus vaccine, Arexvy at pharmacy  Flu and COVID annually    4000 steps, 8000 steps  Hydration with 60-80 ounces, non caffeine, non alcohol beverage  Atrial Fibrillation Atrial fibrillation (AFib) is a type of irregular or rapid heartbeat (arrhythmia). In AFib, the top part of the heart (atria) beats in an irregular pattern. This makes the heart unable to pump blood normally and effectively. The goal of treatment is to prevent blood clots from forming, control your heart rate, or restore your heartbeat to a normal rhythm. If this condition is not treated, it can cause serious problems, such as a weakened heart muscle (cardiomyopathy) or a stroke. What are the causes? This condition is often caused by medical conditions that damage the heart's electrical system. These include: High blood pressure (hypertension). This is the most common cause. Certain heart problems or conditions, such as heart failure, coronary artery disease, heart valve problems, or heart surgery. Diabetes. Overactive thyroid (hyperthyroidism). Chronic kidney disease. Certain lung conditions, such as emphysema, pneumonia, or COPD. Obstructive sleep apnea. In some cases, the cause of this condition is not known. What increases the risk? This condition is more likely to develop in: Older adults. Athletes who do endurance exercise. People who have a family history of AFib. Males. People who are Caucasian. People who are obese. People who smoke or misuse alcohol. What are the signs or symptoms? Symptoms of this condition include: Fast or irregular heartbeats (palpitations). Discomfort or pain in your chest. Shortness of breath. Sudden light-headedness or weakness. Tiring easily during exercise or activity. Syncope (fainting). Sweating. In some cases, there are no symptoms. How is this diagnosed? Your health care provider may detect AFib when taking your pulse.  If detected, this condition may be diagnosed with: An electrocardiogram (ECG) to check electrical signals of the heart. An ambulatory cardiac monitor to record your heart's activity for a few days. A transthoracic echocardiogram (TTE) to create pictures of your heart. A transesophageal echocardiogram (TEE) to create even clearer pictures of your heart. A stress test to check your blood supply while you exercise. Imaging tests, such as a CT scan or chest X-ray. Blood tests. How is this treated? Treatment depends on underlying conditions and how you feel when you get AFib. This condition may be treated with: Medicines to prevent blood clots or to treat heart rate or heart rhythm problems. Electrical cardioversion to reset the heart's rhythm. A pacemaker to correct abnormal heart rhythm. Ablation to remove the heart tissue that sends abnormal signals. Left atrial appendage closure to seal the area where blood clots can form. In some cases, underlying conditions will be treated. Follow these instructions at home: Medicines Take over-the counter and prescription medicines only as told by your provider. Do not take any new medicines without talking to your provider. If you are taking blood thinners: Talk with your provider before taking aspirin or NSAIDs. These medicines can raise your risk of bleeding. Take your medicines as told. Take them at the same time each day. Do not do things that could hurt or bruise you. Be careful to avoid falls. Wear an alert bracelet or carry a card that says that you take blood thinners. Lifestyle Do not use any products that contain nicotine or tobacco. These products include cigarettes, chewing tobacco, and vaping devices, such as e-cigarettes. If you need help quitting, ask your provider. Eat heart-healthy foods. Talk with a food expert (dietitian) to make an eating plan that is  right for you. Exercise regularly as told by your provider. Do not drink  alcohol. Lose weight if you are overweight. General instructions If you have obstructive sleep apnea, manage your condition as told by your provider. Do not use diet pills unless your provider approves. Diet pills can make heart problems worse. Keep all follow-up visits. Your provider will want to check your heart rate and rhythm regularly. Contact a health care provider if: You notice a change in the rate, rhythm, or strength of your heartbeat. You are taking a blood thinner and you notice more bruising. You tire more easily when you exercise or do heavy work. You have a sudden change in weight. Get help right away if:  You have chest pain. You have trouble breathing. You have side effects of blood thinners, such as blood in your vomit, poop (stool), or pee (urine), or bleeding that does not stop. You have any symptoms of a stroke. "BE FAST" is an easy way to remember the main warning signs of a stroke: B - Balance. Signs are dizziness, sudden trouble walking, or loss of balance. E - Eyes. Signs are trouble seeing or a sudden change in vision. F - Face. Signs are sudden weakness or numbness of the face, or the face or eyelid drooping on one side. A - Arms. Signs are weakness or numbness in an arm. This happens suddenly and usually on one side of the body. S - Speech.Signs are sudden trouble speaking, slurred speech, or trouble understanding what people say. T - Time. Time to call emergency services. Write down what time symptoms started. Other signs of a stroke, such as: A sudden, severe headache with no known cause. Nausea or vomiting. Seizure. These symptoms may be an emergency. Get help right away. Call 911. Do not wait to see if the symptoms will go away. Do not drive yourself to the hospital. This information is not intended to replace advice given to you by your health care provider. Make sure you discuss any questions you have with your health care provider. Document Revised:  06/18/2022 Document Reviewed: 06/18/2022 Elsevier Patient Education  [place].

## 2022-12-16 NOTE — Progress Notes (Signed)
Subjective:   By signing my name below, I, Kellie Simmering, attest that this documentation has been prepared under the direction and in the presence of Mosie Lukes, MD., 12/16/2022.   Patient ID: William Phillips, male    DOB: January 09, 1951, 72 y.o.   MRN: [id number]  Chief Complaint  Patient presents with   Follow-up    Follow up    HPI Patient is in today for an office visit. He denies CP/palpitations/SOB/HA/congestion/ fever/chills/GI symptoms.  Arthritic Pain/Tingling He reports that occasionally he experiences intermittent tingling in his left hand after sleeping on his left side. However, this does not occur when sleeping on his right side.  Atrial Fibrillation He was diagnosed with atrial fibrillation in 06/2022 and has been managing this with cardiologist Dr. Stanford Breed and electrophysiologist Dr. Myles Gip. He underwent an ablation for AF in 08/2022. He was recently seen by Dr. Myles Gip on 12/01/2022 who said he can return in 1 year. He is walking 2 miles daily and has lost 5 lbs since 11/2022. He has no cardiac complaints today and denies chest pain, palpitations, and shortness of breath. BP Readings from Last 3 Encounters:  12/16/22 110/68  12/01/22 124/70  10/03/22 136/72   Pulse Readings from Last 3 Encounters:  12/16/22 76  12/01/22 60  10/03/22 65   Wt Readings from Last 3 Encounters:  12/16/22 210 lb (95.3 kg)  12/01/22 215 lb 9.6 oz (97.8 kg)  10/03/22 219 lb 9.6 oz (99.6 kg)   Family History His 10 yo daughter has recently been diagnosed with colon cancer.   Nocturia He reports that he is still awakening 2-3 times nightly to urinate and is requesting a referral to urology. Denies urinary symptoms such as dysuria, frequency, and urgency.  Lab Results  Component Value Date   PSA 4.18 (H) 12/16/2022   PSA 5.41 (H) 06/17/2022   PSA 2.33 02/16/2020   Past Medical History:  Diagnosis Date   Allergic rhinitis    Ankle fracture 1998   Atrial flutter ([place])    a.  s/p CTI ablation by Dr Rayann Heman 11/2014   Back pain 08/05/2015   BPH (benign prostatic hyperplasia) 08/26/2015   Ganglion cyst of dorsum of right wrist 04/08/2017   GERD (gastroesophageal reflux disease)    Hammer toe of right foot 05/06/2013   History of carpal tunnel syndrome    Hyperlipidemia    Hypertension    Insect bite 04/23/2015   OA (osteoarthritis) 02/06/2013   knees   Pedal edema 04/08/2017   Preventative health care 04/27/2011   Had normal colonoscopy in 2011     Past Surgical History:  Procedure Laterality Date   A-FLUTTER ABLATION N/A 09/12/2021   Procedure: A-FLUTTER ABLATION;  Surgeon: Thompson Grayer, MD;  Location: [place] [facility];  Service: Cardiovascular;  Laterality: N/A;   ABLATION  11-24-14   CTI ablation by Dr Rayann Heman   ANKLE SURGERY     ATRIAL FIBRILLATION ABLATION N/A 09/02/2022   Procedure: ATRIAL FIBRILLATION ABLATION;  Surgeon: Melida Quitter, MD;  Location: [place] [facility];  Service: Cardiovascular;  Laterality: N/A;   ATRIAL FLUTTER ABLATION N/A 11/24/2014   Procedure: ATRIAL FLUTTER ABLATION;  Surgeon: Thompson Grayer, MD;  Location: [facility] CATH LAB;  Service: Cardiovascular;  Laterality: N/A;   CARDIOVERSION N/A 07/14/2022   Procedure: CARDIOVERSION;  Surgeon: Pixie Casino, MD;  Location: [facility] ENDOSCOPY;  Service: Cardiovascular;  Laterality: N/A;   Carpel tunnel     COLONOSCOPY  08/04/2002   Normal Exam-  Dr Fransico Meadow HERNIA REPAIR  2008   TONSILLECTOMY      Family History  Problem Relation Age of Onset   Arthritis Mother    Stroke Mother    Coronary artery disease Father        aortic valve disease   Heart attack Father 53   COPD Father    Hypertension Father    Hyperlipidemia Father    Dementia Father    Cancer Sister 54       breast cancer   Diabetes Sister    Diabetes type II Maternal Grandfather    Diabetes Maternal Grandfather    Hyperlipidemia Other    Hypertension Other     Social History   Socioeconomic History    Marital status: Married    Spouse name: Not on file   Number of children: 1   Years of education: Not on file   Highest education level: Not on file  Occupational History    Comment: Sales  Tobacco Use   Smoking status: Former   Smokeless tobacco: Never   Tobacco comments:    5-10 pack year history  Vaping Use   Vaping Use: Never used  Substance and Sexual Activity   Alcohol use: Yes    Alcohol/week: 10.0 standard drinks of alcohol    Types: 10 Glasses of wine per week    Comment: 1-2 drinks daily (wine)   Drug use: No   Sexual activity: Yes  Other Topics Concern   Not on file  Social History Narrative   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, seed, erosion control)   Married- 1 years   21 daughter -Paramedic in   ([place]   Previous smoker - 5-10 pack yrs     Alcohol use-yes (2-3 glasses of wine)     Social Determinants of Health   Financial Resource Strain: Low Risk  (10/01/2021)   Overall Financial Resource Strain (CARDIA)    Difficulty of Paying Living Expenses: Not hard at all  Food Insecurity: No Food Insecurity (10/01/2021)   Hunger Vital Sign    Worried About Running Out of Food in the Last Year: Never true    Ran Out of Food in the Last Year: Never true  Transportation Needs: No Transportation Needs (10/01/2021)   PRAPARE - Hydrologist (Medical): No    Lack of Transportation (Non-Medical): No  Physical Activity: Not on file  Stress: No Stress Concern Present (10/01/2021)   [place]    Feeling of Stress : Not at all  Social Connections: [place] (10/01/2021)   Social Connection and Isolation Panel [NHANES]    Frequency of Communication with Friends and Family: More than three times a week    Frequency of Social Gatherings with Friends and Family: More than three times a week    Attends Religious Services: More than 4 times per year     Active Member of [employer] or Organizations: Yes    Attends Music therapist: More than 4 times per year    Marital Status: Married  Human resources officer Violence: Not At Risk (10/01/2021)   Humiliation, Afraid, Rape, and Kick questionnaire    Fear of Current or Ex-Partner: No    Emotionally Abused: No    Physically Abused: No    Sexually Abused: No    Outpatient Medications Prior to Visit  Medication Sig Dispense Refill   amLODipine (NORVASC) 10 MG tablet  TAKE ONE-HALF TABLET BY MOUTH  TWICE DAILY 100 tablet 2   Ascorbic Acid (VITAMIN C PO) Take 1 tablet by mouth daily.     Brimonidine Tartrate (LUMIFY) 0.025 % SOLN Place 1 drop into both eyes every morning.     Carboxymethylcellulose Sodium (LUBRICANT EYE DROPS OP) Place 1 drop into both eyes 2 (two) times daily as needed (Dry eye).     clindamycin (CLEOCIN T) 1 % external solution Apply 1 Application topically 2 (two) times daily.     Clobetasol Propionate 0.05 % shampoo Apply 1 application topically daily as needed (irritation).     ELIQUIS 5 MG TABS tablet TAKE 1 TABLET BY MOUTH TWICE  DAILY 200 tablet 2   fexofenadine (ALLEGRA) 180 MG tablet Take 1 tablet (180 mg total) by mouth daily.     fluticasone (FLONASE) 50 MCG/ACT nasal spray Place 2 sprays into both nostrils daily as needed for allergies or rhinitis.  2   ketoconazole (NIZORAL) 2 % shampoo Apply 1 application topically daily as needed for irritation.     lisinopril-hydrochlorothiazide (ZESTORETIC) 20-12.5 MG tablet TAKE 1 TABLET BY MOUTH DAILY 90 tablet 3   metoprolol succinate (TOPROL-XL) 50 MG 24 hr tablet TAKE 1 TABLET BY MOUTH TWICE  DAILY WITH OR IMMEDIATELY  FOLLOWING A MEAL 200 tablet 2   Multiple Vitamin (MULTIVITAMIN) tablet Take 1 tablet by mouth every evening.     omeprazole (PRILOSEC) 20 MG capsule TAKE 1 CAPSULE BY MOUTH DAILY 100 capsule 2   rosuvastatin (CRESTOR) 40 MG tablet Take 1 tablet (40 mg total) by mouth daily. 90 tablet 3   sildenafil (REVATIO)  20 MG tablet TAKE ONE TO FOUR TABLETS BY MOUTH DAILY AS NEEDED 90 tablet 1   tamsulosin (FLOMAX) 0.4 MG CAPS capsule TAKE 1 CAPSULE BY MOUTH DAILY 100 capsule 2   VITAMIN D PO Take 1 capsule by mouth daily.     No facility-administered medications prior to visit.    Allergies  Allergen Reactions   Penicillins Swelling    irriation    Review of Systems  Constitutional:  Negative for chills and fever.  HENT:  Negative for congestion.   Respiratory:  Negative for shortness of breath.   Cardiovascular:  Negative for chest pain and palpitations.  Gastrointestinal:  Negative for abdominal pain, blood in stool, constipation, diarrhea, nausea and vomiting.  Genitourinary:  Negative for dysuria, frequency, hematuria and urgency.       (+) nocturia.  Skin:           Neurological:  Negative for headaches.       Objective:    Physical Exam Constitutional:      General: He is not in acute distress.    Appearance: Normal appearance. He is normal weight. He is not ill-appearing.  HENT:     Head: Normocephalic and atraumatic.     Right Ear: External ear normal.     Left Ear: External ear normal.     Nose: Nose normal.     Mouth/Throat:     Mouth: Mucous membranes are moist.     Pharynx: Oropharynx is clear.  Eyes:     General:        Right eye: No discharge.        Left eye: No discharge.     Extraocular Movements: Extraocular movements intact.     Conjunctiva/sclera: Conjunctivae normal.     Pupils: Pupils are equal, round, and reactive to light.  Cardiovascular:     Rate and  Rhythm: Normal rate and regular rhythm.     Pulses: Normal pulses.     Heart sounds: Normal heart sounds. No murmur heard.    No gallop.  Pulmonary:     Effort: Pulmonary effort is normal. No respiratory distress.     Breath sounds: Normal breath sounds. No wheezing or rales.  Abdominal:     General: Bowel sounds are normal.     Palpations: Abdomen is soft.     Tenderness: There is no abdominal  tenderness. There is no guarding.  Musculoskeletal:        General: Normal range of motion.     Cervical back: Normal range of motion.     Right lower leg: No edema.     Left lower leg: No edema.  Skin:    General: Skin is warm and dry.  Neurological:     Mental Status: He is alert and oriented to person, place, and time.  Psychiatric:        Mood and Affect: Mood normal.        Behavior: Behavior normal.        Judgment: Judgment normal.     BP 110/68 (BP Location: Right Arm, Patient Position: Sitting, Cuff Size: Normal)   Pulse 76   Temp 97.8 F (36.6 C) (Oral)   Resp 16   Ht 6' (1.829 m)   Wt 210 lb (95.3 kg)   SpO2 96%   BMI 28.48 kg/m  Wt Readings from Last 3 Encounters:  12/16/22 210 lb (95.3 kg)  12/01/22 215 lb 9.6 oz (97.8 kg)  10/03/22 219 lb 9.6 oz (99.6 kg)    Diabetic Foot Exam - Simple   No data filed    Lab Results  Component Value Date   WBC 7.5 12/16/2022   HGB 14.9 12/16/2022   HCT 44.7 12/16/2022   PLT 217.0 12/16/2022   GLUCOSE 118 (H) 12/16/2022   CHOL 135 12/16/2022   TRIG 115.0 12/16/2022   HDL 59.10 12/16/2022   LDLCALC 53 12/16/2022   ALT 19 12/16/2022   AST 22 12/16/2022   NA 136 12/16/2022   K 4.3 12/16/2022   CL 98 12/16/2022   CREATININE 0.78 12/16/2022   BUN 12 12/16/2022   CO2 29 12/16/2022   TSH 1.34 12/16/2022   PSA 4.18 (H) 12/16/2022   HGBA1C 6.2 12/16/2022   MICROALBUR 0.7 10/19/2015    Lab Results  Component Value Date   TSH 1.34 12/16/2022   Lab Results  Component Value Date   WBC 7.5 12/16/2022   HGB 14.9 12/16/2022   HCT 44.7 12/16/2022   MCV 98.2 12/16/2022   PLT 217.0 12/16/2022   Lab Results  Component Value Date   NA 136 12/16/2022   K 4.3 12/16/2022   CO2 29 12/16/2022   GLUCOSE 118 (H) 12/16/2022   BUN 12 12/16/2022   CREATININE 0.78 12/16/2022   BILITOT 1.0 12/16/2022   ALKPHOS 53 12/16/2022   AST 22 12/16/2022   ALT 19 12/16/2022   PROT 6.9 12/16/2022   ALBUMIN 4.2 12/16/2022    CALCIUM 9.5 12/16/2022   ANIONGAP 11 07/03/2022   EGFR 94 08/05/2022   GFR 90.25 12/16/2022   Lab Results  Component Value Date   CHOL 135 12/16/2022   Lab Results  Component Value Date   HDL 59.10 12/16/2022   Lab Results  Component Value Date   LDLCALC 53 12/16/2022   Lab Results  Component Value Date   TRIG 115.0 12/16/2022   Lab  Results  Component Value Date   CHOLHDL 2 12/16/2022   Lab Results  Component Value Date   HGBA1C 6.2 12/16/2022      Assessment & Plan:  Arthritis: Recommended moist heat and stretching to manage arthritic pain/tingling.  Healthy Lifestyle: Encouraged adequate sleep, heart healthy diet, 60-80 oz of non-alcohol and non-caffeinated liquids, and minimum of 4000 steps daily.  Immunizations: Reviewed immunization history. Recommended COVID-19, Flu, Prevnar 20, and RSV vaccinations. Prevnar 20 administered today in office.  Labs: Routine blood work ordered.  Nocturia: Ambulatory referral placed to [facility]. Problem List Items Addressed This Visit     Arthritis    Notes some pain and tingling in left arm and hand upon awakening in am but this resolves quickly with movement. Suspect arthritic/degenerative changes in cervical spine and/or shoulder. Encouraged moist heat and gentle stretching as tolerated. May try NSAIDs and prescription meds as directed and report if symptoms worsen or seek immediate care       Atrial fibrillation [facility]) - Primary    S/p ablation and doing great. Is now going to follow with cardiology annually and as needed. Tolerating Eliquis      BPH (benign prostatic hyperplasia)   Relevant Orders   PSA (Completed)   Elevated PSA    Referred to urology for further consideration      Relevant Orders   Ambulatory referral to Urology   PSA (Completed)   Essential hypertension    Well controlled, no changes to meds. Encouraged heart healthy diet such as the DASH diet and exercise as tolerated.  Goven Prevnar 20  today      Relevant Orders   CBC with Differential/Platelet (Completed)   Comprehensive metabolic panel (Completed)   TSH (Completed)   Hyperglycemia    hgba1c acceptable, minimize simple carbs. Increase exercise as tolerated.       Relevant Orders   Hemoglobin A1c (Completed)   Hyperlipidemia, mixed    Encourage heart healthy diet such as MIND or DASH diet, increase exercise, avoid trans fats, simple carbohydrates and processed foods, consider a krill or fish or flaxseed oil cap daily. Labs ordered and reviewed.       Relevant Orders   Lipid panel (Completed)   Nocturia   Relevant Orders   Ambulatory referral to Urology   PSA (Completed)   Other Visit Diagnoses     Need for pneumococcal vaccine       Relevant Orders   Pneumococcal conjugate vaccine 20-valent (Prevnar 20) (Completed)      No orders of the defined types were placed in this encounter.  I, Penni Homans, MD, personally preformed the services described in this documentation.  All medical record entries made by the scribe were at my direction and in my presence.  I have reviewed the chart and discharge instructions (if applicable) and agree that the record reflects my personal performance and is accurate and complete. 12/16/2022  I,Mohammed Iqbal,acting as a scribe for Penni Homans, MD.,have documented all relevant documentation on the behalf of Penni Homans, MD,as directed by  Penni Homans, MD while in the presence of Penni Homans, MD.  Penni Homans, MD

## 2022-12-18 ENCOUNTER — Encounter: Payer: Self-pay | Admitting: *Deleted

## 2022-12-19 DIAGNOSIS — R972 Elevated prostate specific antigen [PSA]: Secondary | ICD-10-CM | POA: Insufficient documentation

## 2022-12-19 NOTE — Assessment & Plan Note (Signed)
Notes some pain and tingling in left arm and hand upon awakening in am but this resolves quickly with movement. Suspect arthritic/degenerative changes in cervical spine and/or shoulder. Encouraged moist heat and gentle stretching as tolerated. May try NSAIDs and prescription meds as directed and report if symptoms worsen or seek immediate care

## 2022-12-19 NOTE — Assessment & Plan Note (Signed)
Well controlled, no changes to meds. Encouraged heart healthy diet such as the DASH diet and exercise as tolerated.  Goven Prevnar 20 today

## 2022-12-19 NOTE — Assessment & Plan Note (Signed)
hgba1c acceptable, minimize simple carbs. Increase exercise as tolerated.  

## 2022-12-19 NOTE — Assessment & Plan Note (Addendum)
Referred to urology for further consideration 

## 2022-12-19 NOTE — Assessment & Plan Note (Signed)
Encourage heart healthy diet such as MIND or DASH diet, increase exercise, avoid trans fats, simple carbohydrates and processed foods, consider a krill or fish or flaxseed oil cap daily. Labs ordered and reviewed.  

## 2022-12-24 ENCOUNTER — Encounter: Payer: Medicare Other | Admitting: Urology

## 2022-12-25 ENCOUNTER — Ambulatory Visit: Payer: Medicare Other | Admitting: Urology

## 2022-12-25 ENCOUNTER — Encounter: Payer: Self-pay | Admitting: Urology

## 2022-12-25 VITALS — BP 160/89 | HR 64 | Ht 72.0 in | Wt 210.0 lb

## 2022-12-25 DIAGNOSIS — R351 Nocturia: Secondary | ICD-10-CM | POA: Diagnosis not present

## 2022-12-25 DIAGNOSIS — R339 Retention of urine, unspecified: Secondary | ICD-10-CM

## 2022-12-25 DIAGNOSIS — N138 Other obstructive and reflux uropathy: Secondary | ICD-10-CM

## 2022-12-25 DIAGNOSIS — N401 Enlarged prostate with lower urinary tract symptoms: Secondary | ICD-10-CM

## 2022-12-25 DIAGNOSIS — R972 Elevated prostate specific antigen [PSA]: Secondary | ICD-10-CM

## 2022-12-25 LAB — URINALYSIS, ROUTINE W REFLEX MICROSCOPIC
Bilirubin, UA: NEGATIVE
Blood, UA: NEGATIVE
Glucose, UA: NEGATIVE mg/dL
Ketones, UA: NEGATIVE
Nitrite, UA: NEGATIVE
Protein, UA: NEGATIVE
Spec Grav, UA: 1.02 (ref 1.010–1.025)
Urobilinogen, UA: 0.2 E.U./dL
pH, UA: 7 (ref 5.0–8.0)

## 2022-12-25 LAB — BLADDER SCAN AMB NON-IMAGING

## 2022-12-25 MED ORDER — ALFUZOSIN HCL ER 10 MG PO TB24: 10.0000 mg | ORAL_TABLET | Freq: Every day | ORAL | 11 refills | Status: DC

## 2022-12-25 NOTE — Progress Notes (Signed)
Assessment: 1. Elevated PSA   2. BPH with obstruction/lower urinary tract symptoms   3. Nocturia   4. Incomplete bladder emptying     Plan: I personally reviewed the patient's chart including provider notes, and lab results. Trial of alfuzosin 10 mg daily in place of tamsulosin I discussed possible trial of anti-muscarinic if his symptoms persist despite improved emptying of bladder Today I had a long discussion with the patient regarding PSA and the rationale and controversies of prostate cancer early detection.  I discussed the pros and cons of further evaluation including TRUS and prostate Bx.  Potential adverse events and complications as well as standard instructions were given.  Patient expressed his understanding of these issues. Will make arrangements for repeat PSA testing next visit Return to office in 1 month  Chief Complaint:  Chief Complaint  Patient presents with   Elevated PSA    History of Present Illness:  William Phillips is a 72 y.o. male who is seen in consultation from Mosie Lukes, MD for evaluation of elevated PSA and nocturia. PSA results: 1/17 1.63 2/20 2.22 5/21 2.33 9/23 5.41 3/24 4.18  No prior prostate biopsy.  No history of UTIs or prostatitis.  No family history of prostate cancer.  He reports low urine tract symptoms for 4-5 years.  He has been on tamsulosin 0.4 mg for approximately 5 years.  He continues to have symptoms of frequency, voiding every 2 hours, occasional urgency and urge incontinence, and nocturia 2-5 times per night.  He reports voiding with a good stream.  He is unsure if he empties his bladder completely with each void.  No dysuria or gross hematuria. IPSS = 18 today.  Past Medical History:  Past Medical History:  Diagnosis Date   Allergic rhinitis    Ankle fracture 1998   Atrial flutter ([place])    a. s/p CTI ablation by Dr Rayann Heman 11/2014   Back pain 08/05/2015   BPH (benign prostatic hyperplasia) 08/26/2015    Ganglion cyst of dorsum of right wrist 04/08/2017   GERD (gastroesophageal reflux disease)    Hammer toe of right foot 05/06/2013   History of carpal tunnel syndrome    Hyperlipidemia    Hypertension    Insect bite 04/23/2015   OA (osteoarthritis) 02/06/2013   knees   Pedal edema 04/08/2017   Preventative health care 04/27/2011   Had normal colonoscopy in 2011     Past Surgical History:  Past Surgical History:  Procedure Laterality Date   A-FLUTTER ABLATION N/A 09/12/2021   Procedure: A-FLUTTER ABLATION;  Surgeon: Thompson Grayer, MD;  Location: [place] [facility];  Service: Cardiovascular;  Laterality: N/A;   ABLATION  11-24-14   CTI ablation by Dr Rayann Heman   ANKLE SURGERY     ATRIAL FIBRILLATION ABLATION N/A 09/02/2022   Procedure: ATRIAL FIBRILLATION ABLATION;  Surgeon: Melida Quitter, MD;  Location: [place] [facility];  Service: Cardiovascular;  Laterality: N/A;   ATRIAL FLUTTER ABLATION N/A 11/24/2014   Procedure: ATRIAL FLUTTER ABLATION;  Surgeon: Thompson Grayer, MD;  [facility] CATH LAB;  Service: Cardiovascular;  Laterality: N/A;   CARDIOVERSION N/A 07/14/2022   Procedure: CARDIOVERSION;  Surgeon: Pixie Casino, MD;  Location: [facility] ENDOSCOPY;  Service: Cardiovascular;  Laterality: N/A;   Carpel tunnel     COLONOSCOPY  08/04/2002   Normal Exam- Dr Fransico Meadow HERNIA REPAIR  2008   TONSILLECTOMY      Allergies:  Allergies  Allergen Reactions  Penicillins Swelling    irriation    Family History:  Family History  Problem Relation Age of Onset   Arthritis Mother    Stroke Mother    Coronary artery disease Father        aortic valve disease   Heart attack Father 45   COPD Father    Hypertension Father    Hyperlipidemia Father    Dementia Father    Cancer Sister 56       breast cancer   Diabetes Sister    Diabetes type II Maternal Grandfather    Diabetes Maternal Grandfather    Hyperlipidemia Other    Hypertension Other     Social History:  Social  History   Tobacco Use   Smoking status: Former   Smokeless tobacco: Never   Tobacco comments:    5-10 pack year history  Vaping Use   Vaping Use: Never used  Substance Use Topics   Alcohol use: Yes    Alcohol/week: 10.0 standard drinks of alcohol    Types: 10 Glasses of wine per week    Comment: 1-2 drinks daily (wine)   Drug use: No    Review of symptoms:  Constitutional:  Negative for unexplained weight loss, night sweats, fever, chills ENT:  Negative for nose bleeds, sinus pain, painful swallowing CV:  Negative for chest pain, shortness of breath, exercise intolerance, palpitations, loss of consciousness Resp:  Negative for cough, wheezing, shortness of breath GI:  Negative for nausea, vomiting, diarrhea, bloody stools GU:  Positives noted in HPI; otherwise negative for gross hematuria, dysuria Neuro:  Negative for seizures, poor balance, limb weakness, slurred speech Psych:  Negative for lack of energy, depression, anxiety Endocrine:  Negative for polydipsia, polyuria, symptoms of hypoglycemia (dizziness, hunger, sweating) Hematologic:  Negative for anemia, purpura, petechia, prolonged or excessive bleeding, use of anticoagulants  Allergic:  Negative for difficulty breathing or choking as a result of exposure to anything; no shellfish allergy; no allergic response (rash/itch) to materials, foods  Physical exam: BP (!) 160/89   Pulse 64   Ht 6' (1.829 m)   Wt 210 lb (95.3 kg)   BMI 28.48 kg/m  GENERAL APPEARANCE:  Well appearing, well developed, well nourished, NAD HEENT: Atraumatic, Normocephalic, oropharynx clear. NECK: Supple without lymphadenopathy or thyromegaly. LUNGS: Clear to auscultation bilaterally. HEART: Regular Rate and Rhythm without murmurs, gallops, or rubs. ABDOMEN: Soft, non-tender, No Masses. EXTREMITIES: Moves all extremities well.  Without clubbing, cyanosis, or edema. NEUROLOGIC:  Alert and oriented x 3, normal gait, CN II-XII grossly intact.   MENTAL STATUS:  Appropriate. BACK:  Non-tender to palpation.  No CVAT SKIN:  Warm, dry and intact.  GU: Penis:  uncircumcised Meatus: mild erythema of glans at meatus Scrotum: normal, no masses Testis: normal without masses bilateral Prostate: 30 g, NT, no nodules Rectum: Normal tone,  no masses or tenderness    Results: U/A: 0-5 WBC, 0-2 RBC, few bacteria  PVR:  181 ml

## 2022-12-29 ENCOUNTER — Encounter: Payer: Self-pay | Admitting: Urology

## 2022-12-29 ENCOUNTER — Other Ambulatory Visit: Payer: Self-pay | Admitting: Urology

## 2022-12-29 DIAGNOSIS — N138 Other obstructive and reflux uropathy: Secondary | ICD-10-CM

## 2022-12-29 MED ORDER — SILODOSIN 8 MG PO CAPS: 8.0000 mg | ORAL_CAPSULE | Freq: Every day | ORAL | 11 refills | Status: DC

## 2023-01-12 ENCOUNTER — Other Ambulatory Visit: Payer: Self-pay | Admitting: Family Medicine

## 2023-01-26 ENCOUNTER — Encounter: Payer: Self-pay | Admitting: Urology

## 2023-01-26 ENCOUNTER — Ambulatory Visit: Payer: Medicare Other | Admitting: Urology

## 2023-01-26 VITALS — BP 138/77 | HR 72 | Ht 72.0 in | Wt 200.0 lb

## 2023-01-26 DIAGNOSIS — N138 Other obstructive and reflux uropathy: Secondary | ICD-10-CM | POA: Diagnosis not present

## 2023-01-26 DIAGNOSIS — R351 Nocturia: Secondary | ICD-10-CM

## 2023-01-26 DIAGNOSIS — N401 Enlarged prostate with lower urinary tract symptoms: Secondary | ICD-10-CM

## 2023-01-26 DIAGNOSIS — R972 Elevated prostate specific antigen [PSA]: Secondary | ICD-10-CM | POA: Diagnosis not present

## 2023-01-26 DIAGNOSIS — R339 Retention of urine, unspecified: Secondary | ICD-10-CM

## 2023-01-26 LAB — URINALYSIS, ROUTINE W REFLEX MICROSCOPIC
Bilirubin, UA: NEGATIVE
Ketones, UA: NEGATIVE
Nitrite, UA: NEGATIVE
Protein,UA: NEGATIVE
RBC, UA: NEGATIVE
Specific Gravity, UA: 1.02 (ref 1.005–1.030)
Urobilinogen, Ur: 0.2 mg/dL (ref 0.2–1.0)
pH, UA: 7 (ref 5.0–7.5)

## 2023-01-26 LAB — MICROSCOPIC EXAMINATION
Cast Type: NONE SEEN
Casts: NONE SEEN /lpf
Crystal Type: NONE SEEN
Crystals: NONE SEEN
Renal Epithel, UA: NONE SEEN /hpf
Trichomonas, UA: NONE SEEN
Yeast, UA: NONE SEEN

## 2023-01-26 LAB — BLADDER SCAN AMB NON-IMAGING

## 2023-01-26 NOTE — Progress Notes (Signed)
Assessment: 1. BPH with obstruction/lower urinary tract symptoms   2. Elevated PSA   3. Incomplete bladder emptying   4. Nocturia    Plan: Continue silodosin PSA in 2 months - will call with results  Chief Complaint:  Chief Complaint  Patient presents with   Benign Prostatic Hypertrophy    History of Present Illness:  William Phillips is a 72 y.o. male who is seen for further evaluation of elevated PSA and nocturia. PSA results: 1/17 1.63 2/20 2.22 5/21 2.33 9/23 5.41 3/24 4.18  No prior prostate biopsy.  No history of UTIs or prostatitis.  No family history of prostate cancer.  He reported lower urinary tract symptoms for 4-5 years.  He had been on tamsulosin 0.4 mg for approximately 5 years.  He continued to have symptoms of frequency, voiding every 2 hours, occasional urgency and urge incontinence, and nocturia 2-5 times per night.  He reported voiding with a good stream.   No dysuria or gross hematuria. IPSS = 18. PVR = 181 ml.  He was given a trial of alfuzosin 10 mg daily at his visit in 3/24.  He reported difficulty voiding while on the medication and was changed to silodosin.  He returns today for follow-up.  He continues on silodosin.  He reports improvement in his urinary symptoms with the medication.  He has noted some improvement in his urinary stream as well as decreased nocturia and frequency.  No dysuria or gross hematuria. IPSS = 16 today.  Portions of the above documentation were copied from a prior visit for review purposes only.   Past Medical History:  Past Medical History:  Diagnosis Date   Allergic rhinitis    Ankle fracture 1998   Atrial flutter (HCC)    a. s/p CTI ablation by Dr Johney Frame 11/2014   Back pain 08/05/2015   BPH (benign prostatic hyperplasia) 08/26/2015   Ganglion cyst of dorsum of right wrist 04/08/2017   GERD (gastroesophageal reflux disease)    Hammer toe of right foot 05/06/2013   History of carpal tunnel syndrome     Hyperlipidemia    Hypertension    Insect bite 04/23/2015   OA (osteoarthritis) 02/06/2013   knees   Pedal edema 04/08/2017   Preventative health care 04/27/2011   Had normal colonoscopy in 2011     Past Surgical History:  Past Surgical History:  Procedure Laterality Date   A-FLUTTER ABLATION N/A 09/12/2021   Procedure: A-FLUTTER ABLATION;  Surgeon: Hillis Range, MD;  Location: [facility];  Service: Cardiovascular;  Laterality: N/A;   ABLATION  11-24-14   CTI ablation by Dr Johney Frame   ANKLE SURGERY     ATRIAL FIBRILLATION ABLATION N/A 09/02/2022   Procedure: ATRIAL FIBRILLATION ABLATION;  Surgeon: Maurice Small, MD;  Location: [facility];  Service: Cardiovascular;  Laterality: N/A;   ATRIAL FLUTTER ABLATION N/A 11/24/2014   Procedure: ATRIAL FLUTTER ABLATION;  Surgeon: Hillis Range, MD;  Location: [facility] CATH LAB;  Service: Cardiovascular;  Laterality: N/A;   CARDIOVERSION N/A 07/14/2022   Procedure: CARDIOVERSION;  Surgeon: Chrystie Nose, MD;  Location: [facility] ENDOSCOPY;  Service: Cardiovascular;  Laterality: N/A;   Carpel tunnel     COLONOSCOPY  08/04/2002   Normal Exam- Dr Shelbie Proctor HERNIA REPAIR  2008   TONSILLECTOMY      Allergies:  Allergies  Allergen Reactions   Penicillins Swelling    irriation    Family History:  Family History  Problem Relation  Age of Onset   Arthritis Mother    Stroke Mother    Coronary artery disease Father        aortic valve disease   Heart attack Father 66   COPD Father    Hypertension Father    Hyperlipidemia Father    Dementia Father    Cancer Sister 68       breast cancer   Diabetes Sister    Diabetes type II Maternal Grandfather    Diabetes Maternal Grandfather    Hyperlipidemia Other    Hypertension Other     Social History:  Social History   Tobacco Use   Smoking status: Former   Smokeless tobacco: Never   Tobacco comments:    5-10 pack year history  Vaping Use   Vaping Use: Never used   Substance Use Topics   Alcohol use: Yes    Alcohol/week: 10.0 standard drinks of alcohol    Types: 10 Glasses of wine per week    Comment: 1-2 drinks daily (wine)   Drug use: No    ROS: Constitutional:  Negative for fever, chills, weight loss CV: Negative for chest pain, previous MI, hypertension Respiratory:  Negative for shortness of breath, wheezing, sleep apnea, frequent cough GI:  Negative for nausea, vomiting, bloody stool, GERD  Physical exam: BP 138/77   Pulse 72   Ht 6' (1.829 m)   Wt 200 lb (90.7 kg)   BMI 27.12 kg/m  GENERAL APPEARANCE:  Well appearing, well developed, well nourished, NAD HEENT:  Atraumatic, normocephalic, oropharynx clear NECK:  Supple without lymphadenopathy or thyromegaly ABDOMEN:  Soft, non-tender, no masses EXTREMITIES:  Moves all extremities well, without clubbing, cyanosis, or edema NEUROLOGIC:  Alert and oriented x 3, normal gait, CN II-XII grossly intact MENTAL STATUS:  appropriate BACK:  Non-tender to palpation, No CVAT SKIN:  Warm, dry, and intact    Results:  Results for orders placed or performed in visit on 01/26/23 (from the past 24 hour(s))  Urinalysis, Routine w reflex microscopic     Status: Abnormal   Collection Time: 01/26/23 10:02 AM  Result Value Ref Range   Specific Gravity, UA 1.020 1.005 - 1.030   pH, UA 7.0 5.0 - 7.5   Color, UA Yellow Yellow   Appearance Ur Clear Clear   Leukocytes,UA Trace (A) Negative   Protein,UA Negative Negative/Trace   Glucose, UA Trace (A) Negative   Ketones, UA Negative Negative   RBC, UA Negative Negative   Bilirubin, UA Negative Negative   Urobilinogen, Ur 0.2 0.2 - 1.0 mg/dL   Nitrite, UA Negative Negative   Microscopic Examination See below:    Narrative   Performed at:  01 - [facility]  [facility] [address], [place], [place]  [id number] Lab Director: Corinna Lines MT, Phone:  [phone]  Microscopic Examination     Status: Abnormal   Collection Time:  01/26/23 10:02 AM   Urine  Result Value Ref Range   WBC, UA 0-5 0 - 5 /hpf   RBC, Urine 0-2 0 - 2 /hpf   Epithelial Cells (non renal) 0-10 0 - 10 /hpf   Renal Epithel, UA None seen None seen /hpf   Casts None seen None seen /lpf   Cast Type None seen N/A   Crystals None seen N/A   Crystal Type None seen N/A   Mucus, UA Present (A) Not Estab.   Bacteria, UA Few (A) None seen/Few   Yeast, UA None seen None seen  Trichomonas, UA None seen None seen   Narrative   Performed at:  01 - [facility]  [facility] [address], [place], [place]  [id number] Lab Director: Corinna Lines MT, Phone:  [phone]

## 2023-02-11 DIAGNOSIS — L218 Other seborrheic dermatitis: Secondary | ICD-10-CM | POA: Diagnosis not present

## 2023-02-11 DIAGNOSIS — D1801 Hemangioma of skin and subcutaneous tissue: Secondary | ICD-10-CM | POA: Diagnosis not present

## 2023-02-11 DIAGNOSIS — Z85828 Personal history of other malignant neoplasm of skin: Secondary | ICD-10-CM | POA: Diagnosis not present

## 2023-02-11 DIAGNOSIS — L570 Actinic keratosis: Secondary | ICD-10-CM | POA: Diagnosis not present

## 2023-02-11 DIAGNOSIS — Z129 Encounter for screening for malignant neoplasm, site unspecified: Secondary | ICD-10-CM | POA: Diagnosis not present

## 2023-03-20 ENCOUNTER — Other Ambulatory Visit: Payer: Self-pay | Admitting: Family Medicine

## 2023-03-24 ENCOUNTER — Other Ambulatory Visit: Payer: Self-pay | Admitting: Urology

## 2023-03-24 DIAGNOSIS — N138 Other obstructive and reflux uropathy: Secondary | ICD-10-CM

## 2023-03-24 MED ORDER — SILODOSIN 8 MG PO CAPS: 8.0000 mg | ORAL_CAPSULE | Freq: Every day | ORAL | 3 refills | Status: DC

## 2023-03-30 ENCOUNTER — Other Ambulatory Visit: Payer: Medicare Other

## 2023-03-30 DIAGNOSIS — N138 Other obstructive and reflux uropathy: Secondary | ICD-10-CM | POA: Diagnosis not present

## 2023-03-30 DIAGNOSIS — R972 Elevated prostate specific antigen [PSA]: Secondary | ICD-10-CM

## 2023-04-01 LAB — PSA TOTAL (REFLEX TO FREE): Prostate Specific Ag, Serum: 5.3 ng/mL — ABNORMAL HIGH (ref 0.0–4.0)

## 2023-04-01 LAB — FPSA% REFLEX
% FREE PSA: 14 %
PSA, FREE: 0.74 ng/mL

## 2023-04-08 ENCOUNTER — Other Ambulatory Visit: Payer: Self-pay | Admitting: Urology

## 2023-04-08 DIAGNOSIS — R972 Elevated prostate specific antigen [PSA]: Secondary | ICD-10-CM

## 2023-05-06 ENCOUNTER — Other Ambulatory Visit: Payer: Self-pay | Admitting: Family Medicine

## 2023-05-21 ENCOUNTER — Other Ambulatory Visit: Payer: Medicare Other

## 2023-05-27 ENCOUNTER — Ambulatory Visit
Admission: RE | Admit: 2023-05-27 | Discharge: 2023-05-27 | Disposition: A | Discharge status: Home / Self Care | Payer: Medicare Other | Source: Ambulatory Visit | Attending: Urology | Admitting: Urology

## 2023-05-27 DIAGNOSIS — R972 Elevated prostate specific antigen [PSA]: Secondary | ICD-10-CM

## 2023-05-27 MED ORDER — GADOPICLENOL 0.5 MMOL/ML IV SOLN
9.0000 mL | Freq: Once | INTRAVENOUS | Status: AC | PRN
  Administered 2023-05-27: 9 mL via INTRAVENOUS

## 2023-05-28 ENCOUNTER — Encounter (INDEPENDENT_AMBULATORY_CARE_PROVIDER_SITE_OTHER): Payer: Self-pay

## 2023-05-28 ENCOUNTER — Encounter: Payer: Self-pay | Admitting: Urology

## 2023-05-29 ENCOUNTER — Telehealth: Payer: Self-pay | Admitting: Urology

## 2023-05-29 ENCOUNTER — Other Ambulatory Visit: Payer: Self-pay | Admitting: Urology

## 2023-05-29 DIAGNOSIS — R972 Elevated prostate specific antigen [PSA]: Secondary | ICD-10-CM

## 2023-05-29 NOTE — Telephone Encounter (Signed)
Returnng our call regarding MRI results

## 2023-06-01 ENCOUNTER — Other Ambulatory Visit: Payer: Self-pay | Admitting: Family Medicine

## 2023-06-01 ENCOUNTER — Other Ambulatory Visit: Payer: Self-pay | Admitting: Cardiology

## 2023-06-01 DIAGNOSIS — I4892 Unspecified atrial flutter: Secondary | ICD-10-CM

## 2023-06-01 DIAGNOSIS — E782 Mixed hyperlipidemia: Secondary | ICD-10-CM

## 2023-06-03 NOTE — Telephone Encounter (Signed)
Eliquis 5mg  refill request received. Patient is 72 years old, weight-90.7kg, Crea- 0.78 on 12/16/22, Diagnosis-Afib, and last seen by Dr. Nelly Laurence on 12/01/22. Dose is appropriate based on dosing criteria. Will send in refill to requested pharmacy.

## 2023-06-10 ENCOUNTER — Telehealth: Payer: Self-pay | Admitting: Cardiology

## 2023-06-10 NOTE — Telephone Encounter (Signed)
Marcelino Duster called stating she faxed a clearance request for the patient, but clearance has already been received peer to peer. Okay to disregard.

## 2023-06-11 ENCOUNTER — Telehealth: Payer: Self-pay

## 2023-06-11 NOTE — Telephone Encounter (Deleted)
Patient with diagnosis of *** on *** for anticoagulation.    Procedure:  FUSION BIOSPY  Date of procedure: TBD   CHA2DS2-VASc Score = 2  {Confirm score is correct.  If not, click here to update score.  REFRESH note.  :1} This indicates a 2.2% annual risk of stroke. The patient's score is based upon: CHF History: 0 HTN History: 1 Diabetes History: 0 Stroke History: 0 Vascular Disease History: 0 Age Score: 1 Gender Score: 0   {This patient has a significant risk of stroke if diagnosed with atrial fibrillation.  Please consider VKA or DOAC agent for anticoagulation if the bleeding risk is acceptable.   You can also use the SmartPhrase .[email or web address] for documentation.   :[id number]}   CrCl *** Platelet count ***  Patient does/does not*** require pre-op antibiotics for dental procedure.  Per office protocol, patient can hold *** for *** days prior to procedure.   Patient ***will/will not need bridging with Lovenox (enoxaparin) around procedure.  **This guidance is not considered finalized until pre-operative APP has relayed final recommendations.**

## 2023-06-11 NOTE — Telephone Encounter (Signed)
..     Pre-operative Risk Assessment    Patient Name: William Phillips  DOB: Sep 15, 1951 MRN: [id number]      Request for Surgical Clearance    Procedure:   FUSION BIOSPY  Date of Surgery:  Clearance TBD                                 Surgeon:  Loreli Slot Surgeon's Group or Practice Name:  [facility] Phone number:  [phone] Fax number:  [phone]   Type of Clearance Requested:   - Medical  - Pharmacy:  Hold Apixaban (Eliquis) HOLD 3-5 DAYS   Type of Anesthesia:  Not Indicated   Additional requests/questions:   LAST APPT WITH DR Nelly Laurence 12/01/22. LAST APPT WITH DR Jens Som 09/29/22. NEXT APPT WITH DR CRENSHAW 07/29/23  Signed, Renee Ramus   06/11/2023, 7:59 AM

## 2023-06-12 NOTE — Telephone Encounter (Signed)
   Patient Name: William Phillips  DOB: August 07, 1951 MRN: [id number]  Primary Cardiologist: Olga Millers, MD  Chart reviewed as part of pre-operative protocol coverage. Per notes in Epic on 06/10/2023, "Marcelino Duster called stating she faxed a clearance request for the patient, but clearance has already been received peer to peer. Okay to disregard."  I will route this recommendation to the requesting party via Epic fax function and remove from pre-op pool.  Please call with questions.  Joylene Grapes, NP 06/12/2023, 7:12 AM

## 2023-06-17 NOTE — Assessment & Plan Note (Signed)
hgba1c acceptable, minimize simple carbs. Increase exercise as tolerated.  

## 2023-06-17 NOTE — Assessment & Plan Note (Signed)
Continue to monitor

## 2023-06-17 NOTE — Assessment & Plan Note (Signed)
Rate controlled, tolerating Eliquis 

## 2023-06-17 NOTE — Assessment & Plan Note (Signed)
Well controlled, no changes to meds. Encouraged heart healthy diet such as the DASH diet and exercise as tolerated.  °

## 2023-06-18 ENCOUNTER — Ambulatory Visit (INDEPENDENT_AMBULATORY_CARE_PROVIDER_SITE_OTHER): Payer: Medicare Other | Admitting: Family Medicine

## 2023-06-18 VITALS — BP 126/78 | HR 58 | Temp 98.0°F | Resp 16 | Ht 73.0 in | Wt 214.2 lb

## 2023-06-18 DIAGNOSIS — R7989 Other specified abnormal findings of blood chemistry: Secondary | ICD-10-CM

## 2023-06-18 DIAGNOSIS — I10 Essential (primary) hypertension: Secondary | ICD-10-CM | POA: Diagnosis not present

## 2023-06-18 DIAGNOSIS — I4819 Other persistent atrial fibrillation: Secondary | ICD-10-CM | POA: Diagnosis not present

## 2023-06-18 DIAGNOSIS — E119 Type 2 diabetes mellitus without complications: Secondary | ICD-10-CM | POA: Diagnosis not present

## 2023-06-18 DIAGNOSIS — R739 Hyperglycemia, unspecified: Secondary | ICD-10-CM

## 2023-06-18 LAB — CBC WITH DIFFERENTIAL/PLATELET
Basophils Absolute: 0.1 10*3/uL (ref 0.0–0.1)
Basophils Relative: 0.7 % (ref 0.0–3.0)
Eosinophils Absolute: 0.7 10*3/uL (ref 0.0–0.7)
Eosinophils Relative: 8.6 % — ABNORMAL HIGH (ref 0.0–5.0)
HCT: 47.1 % (ref 39.0–52.0)
Hemoglobin: 15.2 g/dL (ref 13.0–17.0)
Lymphocytes Relative: 24.3 % (ref 12.0–46.0)
Lymphs Abs: 2 10*3/uL (ref 0.7–4.0)
MCHC: 32.3 g/dL (ref 30.0–36.0)
MCV: 99.9 fl (ref 78.0–100.0)
Monocytes Absolute: 0.8 10*3/uL (ref 0.1–1.0)
Monocytes Relative: 9.5 % (ref 3.0–12.0)
Neutro Abs: 4.7 10*3/uL (ref 1.4–7.7)
Neutrophils Relative %: 56.9 % (ref 43.0–77.0)
Platelets: 194 10*3/uL (ref 150.0–400.0)
RBC: 4.71 Mil/uL (ref 4.22–5.81)
RDW: 12.4 % (ref 11.5–15.5)
WBC: 8.3 10*3/uL (ref 4.0–10.5)

## 2023-06-18 LAB — COMPREHENSIVE METABOLIC PANEL
ALT: 26 U/L (ref 0–53)
AST: 24 U/L (ref 0–37)
Albumin: 4 g/dL (ref 3.5–5.2)
Alkaline Phosphatase: 54 U/L (ref 39–117)
BUN: 10 mg/dL (ref 6–23)
CO2: 30 meq/L (ref 19–32)
Calcium: 9.1 mg/dL (ref 8.4–10.5)
Chloride: 99 meq/L (ref 96–112)
Creatinine, Ser: 0.85 mg/dL (ref 0.40–1.50)
GFR: 87.62 mL/min (ref >60.00)
Glucose, Bld: 139 mg/dL — ABNORMAL HIGH (ref 70–99)
Potassium: 4 meq/L (ref 3.5–5.1)
Sodium: 137 meq/L (ref 135–145)
Total Bilirubin: 1 mg/dL (ref 0.2–1.2)
Total Protein: 6.7 g/dL (ref 6.0–8.3)

## 2023-06-18 LAB — LIPID PANEL
Cholesterol: 130 mg/dL (ref 0–200)
HDL: 59.6 mg/dL (ref >39.00)
LDL Cholesterol: 40 mg/dL (ref 0–99)
NonHDL: 70.46
Total CHOL/HDL Ratio: 2
Triglycerides: 151 mg/dL — ABNORMAL HIGH (ref 0.0–149.0)
VLDL: 30.2 mg/dL (ref 0.0–40.0)

## 2023-06-18 LAB — TSH: TSH: 0.99 u[IU]/mL (ref 0.35–5.50)

## 2023-06-18 LAB — HEMOGLOBIN A1C: Hgb A1c MFr Bld: 6.7 % — ABNORMAL HIGH (ref 4.6–6.5)

## 2023-06-18 NOTE — Patient Instructions (Addendum)
Netflix The Blue Zones  Ozempic and Mounjaro for sugar and weight loss  4000 vs 8000 steps daily  Hypertension, Adult High blood pressure (hypertension) is when the force of blood pumping through the arteries is too strong. The arteries are the blood vessels that carry blood from the heart throughout the body. Hypertension forces the heart to work harder to pump blood and may cause arteries to become narrow or stiff. Untreated or uncontrolled hypertension can lead to a heart attack, heart failure, a stroke, kidney disease, and other problems. A blood pressure reading consists of a higher number over a lower number. Ideally, your blood pressure should be below 120/80. The first ("top") number is called the systolic pressure. It is a measure of the pressure in your arteries as your heart beats. The second ("bottom") number is called the diastolic pressure. It is a measure of the pressure in your arteries as the heart relaxes. What are the causes? The exact cause of this condition is not known. There are some conditions that result in high blood pressure. What increases the risk? Certain factors may make you more likely to develop high blood pressure. Some of these risk factors are under your control, including: Smoking. Not getting enough exercise or physical activity. Being overweight. Having too much fat, sugar, calories, or salt (sodium) in your diet. Drinking too much alcohol. Other risk factors include: Having a personal history of heart disease, diabetes, high cholesterol, or kidney disease. Stress. Having a family history of high blood pressure and high cholesterol. Having obstructive sleep apnea. Age. The risk increases with age. What are the signs or symptoms? High blood pressure may not cause symptoms. Very high blood pressure (hypertensive crisis) may cause: Headache. Fast or irregular heartbeats (palpitations). Shortness of breath. Nosebleed. Nausea and vomiting. Vision  changes. Severe chest pain, dizziness, and seizures. How is this diagnosed? This condition is diagnosed by measuring your blood pressure while you are seated, with your arm resting on a flat surface, your legs uncrossed, and your feet flat on the floor. The cuff of the blood pressure monitor will be placed directly against the skin of your upper arm at the level of your heart. Blood pressure should be measured at least twice using the same arm. Certain conditions can cause a difference in blood pressure between your right and left arms. If you have a high blood pressure reading during one visit or you have normal blood pressure with other risk factors, you may be asked to: Return on a different day to have your blood pressure checked again. Monitor your blood pressure at home for 1 week or longer. If you are diagnosed with hypertension, you may have other blood or imaging tests to help your health care provider understand your overall risk for other conditions. How is this treated? This condition is treated by making healthy lifestyle changes, such as eating healthy foods, exercising more, and reducing your alcohol intake. You may be referred for counseling on a healthy diet and physical activity. Your health care provider may prescribe medicine if lifestyle changes are not enough to get your blood pressure under control and if: Your systolic blood pressure is above 130. Your diastolic blood pressure is above 80. Your personal target blood pressure may vary depending on your medical conditions, your age, and other factors. Follow these instructions at home: Eating and drinking  Eat a diet that is high in fiber and potassium, and low in sodium, added sugar, and fat. An example of this  eating plan is called the DASH diet. DASH stands for Dietary Approaches to Stop Hypertension. To eat this way: Eat plenty of fresh fruits and vegetables. Try to fill one half of your plate at each meal with fruits and  vegetables. Eat whole grains, such as whole-wheat pasta, brown rice, or whole-grain bread. Fill about one fourth of your plate with whole grains. Eat or drink low-fat dairy products, such as skim milk or low-fat yogurt. Avoid fatty cuts of meat, processed or cured meats, and poultry with skin. Fill about one fourth of your plate with lean proteins, such as fish, chicken without skin, beans, eggs, or tofu. Avoid pre-made and processed foods. These tend to be higher in sodium, added sugar, and fat. Reduce your daily sodium intake. Many people with hypertension should eat less than 1,500 mg of sodium a day. Do not drink alcohol if: Your health care provider tells you not to drink. You are pregnant, may be pregnant, or are planning to become pregnant. If you drink alcohol: Limit how much you have to: 0-1 drink a day for women. 0-2 drinks a day for men. Know how much alcohol is in your drink. In the [place] one drink equals one 12 oz bottle of beer (355 mL), one 5 oz glass of wine (148 mL), or one 1 oz glass of hard liquor (44 mL). Lifestyle  Work with your health care provider to maintain a healthy body weight or to lose weight. Ask what an ideal weight is for you. Get at least 30 minutes of exercise that causes your heart to beat faster (aerobic exercise) most days of the week. Activities may include walking, swimming, or biking. Include exercise to strengthen your muscles (resistance exercise), such as Pilates or lifting weights, as part of your weekly exercise routine. Try to do these types of exercises for 30 minutes at least 3 days a week. Do not use any products that contain nicotine or tobacco. These products include cigarettes, chewing tobacco, and vaping devices, such as e-cigarettes. If you need help quitting, ask your health care provider. Monitor your blood pressure at home as told by your health care provider. Keep all follow-up visits. This is important. Medicines Take  over-the-counter and prescription medicines only as told by your health care provider. Follow directions carefully. Blood pressure medicines must be taken as prescribed. Do not skip doses of blood pressure medicine. Doing this puts you at risk for problems and can make the medicine less effective. Ask your health care provider about side effects or reactions to medicines that you should watch for. Contact a health care provider if you: Think you are having a reaction to a medicine you are taking. Have headaches that keep coming back (recurring). Feel dizzy. Have swelling in your ankles. Have trouble with your vision. Get help right away if you: Develop a severe headache or confusion. Have unusual weakness or numbness. Feel faint. Have severe pain in your chest or abdomen. Vomit repeatedly. Have trouble breathing. These symptoms may be an emergency. Get help right away. Call 911. Do not wait to see if the symptoms will go away. Do not drive yourself to the hospital. Summary Hypertension is when the force of blood pumping through your arteries is too strong. If this condition is not controlled, it may put you at risk for serious complications. Your personal target blood pressure may vary depending on your medical conditions, your age, and other factors. For most people, a normal blood pressure is less than 120/80.  Hypertension is treated with lifestyle changes, medicines, or a combination of both. Lifestyle changes include losing weight, eating a healthy, low-sodium diet, exercising more, and limiting alcohol. This information is not intended to replace advice given to you by your health care provider. Make sure you discuss any questions you have with your health care provider. Document Revised: 08/06/2021 Document Reviewed: 08/06/2021 Elsevier Patient Education  2024 [employer].

## 2023-06-22 ENCOUNTER — Encounter: Payer: Self-pay | Admitting: Family Medicine

## 2023-06-22 NOTE — Progress Notes (Signed)
Subjective:    Patient ID: William Phillips, male    DOB: 1951/07/14, 72 y.o.   MRN: [id number]  Chief Complaint  Patient presents with   Follow-up    Follow up    HPI Discussed the use of AI scribe software for clinical note transcription with the patient, who gave verbal consent to proceed.  History of Present Illness   The patient, a 72 year old with a history of BPH and AFib, presents for a routine follow-up. He reports no significant changes in his health since his last visit. He mentions an upcoming prostate biopsy scheduled due to two suspicious areas found on a recent MRI. The patient also discusses his BPH management, noting a recent switch from Tamsulosin to Silodosin, which he tolerates well. He experiences nocturia, waking up two to three times a night to urinate, but is able to return to sleep easily. The patient also discusses his weight, expressing potential interest in weight loss medications. Currently, he is managing his weight through diet and exercise, walking approximately two miles daily. He reports recent congestion and headache, which he attributes to his usual sinus issues. Patient is in today for follow up on chronic medical concerns. No recent febrile illness or hospitalizations. Denies CP/palp/SOB/HA/congestion/fevers/GI or GU c/o. Taking meds as prescribed        Past Medical History:  Diagnosis Date   Allergic rhinitis    Ankle fracture 1998   Atrial flutter (HCC)    a. s/p CTI ablation by Dr Johney Frame 11/2014   Back pain 08/05/2015   BPH (benign prostatic hyperplasia) 08/26/2015   Ganglion cyst of dorsum of right wrist 04/08/2017   GERD (gastroesophageal reflux disease)    Hammer toe of right foot 05/06/2013   History of carpal tunnel syndrome    Hyperlipidemia    Hypertension    Insect bite 04/23/2015   OA (osteoarthritis) 02/06/2013   knees   Pedal edema 04/08/2017   Preventative health care 04/27/2011   Had normal colonoscopy in 2011     Past  Surgical History:  Procedure Laterality Date   A-FLUTTER ABLATION N/A 09/12/2021   Procedure: A-FLUTTER ABLATION;  Surgeon: Hillis Range, MD;  Location: [facility];  Service: Cardiovascular;  Laterality: N/A;   ABLATION  11-24-14   CTI ablation by Dr Johney Frame   ANKLE SURGERY     ATRIAL FIBRILLATION ABLATION N/A 09/02/2022   Procedure: ATRIAL FIBRILLATION ABLATION;  Surgeon: Maurice Small, MD;  Location: [facility];  Service: Cardiovascular;  Laterality: N/A;   ATRIAL FLUTTER ABLATION N/A 11/24/2014   Procedure: ATRIAL FLUTTER ABLATION;  Surgeon: Hillis Range, MD;  Location: [facility] CATH LAB;  Service: Cardiovascular;  Laterality: N/A;   CARDIOVERSION N/A 07/14/2022   Procedure: CARDIOVERSION;  Surgeon: Chrystie Nose, MD;  Location: [facility] ENDOSCOPY;  Service: Cardiovascular;  Laterality: N/A;   Carpel tunnel     COLONOSCOPY  08/04/2002   Normal Exam- Dr Shelbie Proctor HERNIA REPAIR  2008   TONSILLECTOMY      Family History  Problem Relation Age of Onset   Arthritis Mother    Stroke Mother    Coronary artery disease Father        aortic valve disease   Heart attack Father 94   COPD Father    Hypertension Father    Hyperlipidemia Father    Dementia Father    Cancer Sister 78       breast cancer   Diabetes Sister    Diabetes  type II Maternal Grandfather    Diabetes Maternal Grandfather    Hyperlipidemia Other    Hypertension Other     Social History   Socioeconomic History   Marital status: Married    Spouse name: Not on file   Number of children: 1   Years of education: Not on file   Highest education level: Not on file  Occupational History    Comment: Sales  Tobacco Use   Smoking status: Former   Smokeless tobacco: Never   Tobacco comments:    5-10 pack year history  Vaping Use   Vaping status: Never Used  Substance and Sexual Activity   Alcohol use: Yes    Alcohol/week: 10.0 standard drinks of alcohol    Types: 10 Glasses of wine per week     Comment: 1-2 drinks daily (wine)   Drug use: No   Sexual activity: Yes  Other Topics Concern   Not on file  Social History Narrative   Occupation: Airline pilot  Architect- chemicals, seed, erosion control)   Married- 35 years   21 daughter -Holiday representative in   ([place]   Previous smoker - 5-10 pack yrs     Alcohol use-yes (2-3 glasses of wine)     Social Determinants of Health   Financial Resource Strain: Low Risk  (10/01/2021)   Overall Financial Resource Strain (CARDIA)    Difficulty of Paying Living Expenses: Not hard at all  Food Insecurity: No Food Insecurity (10/01/2021)   Hunger Vital Sign    Worried About Running Out of Food in the Last Year: Never true    Ran Out of Food in the Last Year: Never true  Transportation Needs: No Transportation Needs (10/01/2021)   PRAPARE - Administrator, Civil Service (Medical): No    Lack of Transportation (Non-Medical): No  Physical Activity: Not on file  Stress: No Stress Concern Present (10/01/2021)   [employer] of Occupational Health - Occupational Stress Questionnaire    Feeling of Stress : Not at all  Social Connections: Socially Integrated (10/01/2021)   Social Connection and Isolation Panel [NHANES]    Frequency of Communication with Friends and Family: More than three times a week    Frequency of Social Gatherings with Friends and Family: More than three times a week    Attends Religious Services: More than 4 times per year    Active Member of [employer] or Organizations: Yes    Attends Engineer, structural: More than 4 times per year    Marital Status: Married  Catering manager Violence: Not At Risk (10/01/2021)   Humiliation, Afraid, Rape, and Kick questionnaire    Fear of Current or Ex-Partner: No    Emotionally Abused: No    Physically Abused: No    Sexually Abused: No    Outpatient Medications Prior to Visit  Medication Sig Dispense Refill   amLODipine (NORVASC) 10 MG tablet TAKE ONE-HALF  TABLET BY MOUTH  TWICE DAILY 100 tablet 2   apixaban (ELIQUIS) 5 MG TABS tablet TAKE 1 TABLET BY MOUTH TWICE  DAILY 200 tablet 1   Ascorbic Acid (VITAMIN C PO) Take 1 tablet by mouth daily.     Brimonidine Tartrate (LUMIFY) 0.025 % SOLN Place 1 drop into both eyes every morning.     Carboxymethylcellulose Sodium (LUBRICANT EYE DROPS OP) Place 1 drop into both eyes 2 (two) times daily as needed (Dry eye).     clindamycin (CLEOCIN T) 1 % external solution Apply 1  Application topically 2 (two) times daily.     Clobetasol Propionate 0.05 % shampoo Apply 1 application topically daily as needed (irritation).     fexofenadine (ALLEGRA) 180 MG tablet Take 1 tablet (180 mg total) by mouth daily.     fluticasone (FLONASE) 50 MCG/ACT nasal spray Place 2 sprays into both nostrils daily as needed for allergies or rhinitis.  2   ketoconazole (NIZORAL) 2 % shampoo Apply 1 application topically daily as needed for irritation.     lisinopril-hydrochlorothiazide (ZESTORETIC) 20-12.5 MG tablet TAKE 1 TABLET BY MOUTH DAILY 100 tablet 2   metoprolol succinate (TOPROL-XL) 50 MG 24 hr tablet TAKE 1 TABLET BY MOUTH TWICE  DAILY WITH OR IMMEDIATELY  FOLLOWING A MEAL 200 tablet 2   Multiple Vitamin (MULTIVITAMIN) tablet Take 1 tablet by mouth every evening.     omeprazole (PRILOSEC) 20 MG capsule TAKE 1 CAPSULE BY MOUTH DAILY 100 capsule 2   rosuvastatin (CRESTOR) 40 MG tablet TAKE 1 TABLET BY MOUTH DAILY 100 tablet 1   sildenafil (REVATIO) 20 MG tablet TAKE 1 TO 4 TABLETS BY MOUTH DAILY AS NEEDED 90 tablet 0   silodosin (RAPAFLO) 8 MG CAPS capsule Take 1 capsule (8 mg total) by mouth daily with breakfast. 90 capsule 3   VITAMIN D PO Take 1 capsule by mouth daily.     No facility-administered medications prior to visit.    Allergies  Allergen Reactions   Penicillins Swelling    irriation    Review of Systems  Constitutional:  Negative for fever and malaise/fatigue.  HENT:  Negative for congestion.   Eyes:   Negative for blurred vision.  Respiratory:  Negative for shortness of breath.   Cardiovascular:  Negative for chest pain, palpitations and leg swelling.  Gastrointestinal:  Negative for abdominal pain, blood in stool and nausea.  Genitourinary:  Negative for dysuria and frequency.  Musculoskeletal:  Negative for falls.  Skin:  Negative for rash.  Neurological:  Negative for dizziness, loss of consciousness and headaches.  Endo/Heme/Allergies:  Negative for environmental allergies.  Psychiatric/Behavioral:  Negative for depression. The patient is not nervous/anxious.        Objective:    Physical Exam Vitals reviewed.  Constitutional:      Appearance: Normal appearance. He is not ill-appearing.  HENT:     Head: Normocephalic and atraumatic.     Nose: Nose normal.  Eyes:     Conjunctiva/sclera: Conjunctivae normal.  Cardiovascular:     Rate and Rhythm: Normal rate.     Pulses: Normal pulses.     Heart sounds: Normal heart sounds. No murmur heard. Pulmonary:     Effort: Pulmonary effort is normal.     Breath sounds: Normal breath sounds. No wheezing.  Abdominal:     Palpations: Abdomen is soft. There is no mass.     Tenderness: There is no abdominal tenderness.  Musculoskeletal:     Cervical back: Normal range of motion.     Right lower leg: No edema.     Left lower leg: No edema.  Skin:    General: Skin is warm and dry.  Neurological:     General: No focal deficit present.     Mental Status: He is alert and oriented to person, place, and time.  Psychiatric:        Mood and Affect: Mood normal.     BP 126/78 (BP Location: Left Arm, Patient Position: Sitting, Cuff Size: Normal)   Pulse (!) 58   Temp 98  F (36.7 C) (Oral)   Resp 16   Ht 6\' 1"  (1.854 m)   Wt 214 lb 3.2 oz (97.2 kg)   SpO2 96%   BMI 28.26 kg/m  Wt Readings from Last 3 Encounters:  06/18/23 214 lb 3.2 oz (97.2 kg)  01/26/23 200 lb (90.7 kg)  12/25/22 210 lb (95.3 kg)    Diabetic Foot Exam -  Simple   No data filed    Lab Results  Component Value Date   WBC 8.3 06/18/2023   HGB 15.2 06/18/2023   HCT 47.1 06/18/2023   PLT 194.0 06/18/2023   GLUCOSE 139 (H) 06/18/2023   CHOL 130 06/18/2023   TRIG 151.0 (H) 06/18/2023   HDL 59.60 06/18/2023   LDLCALC 40 06/18/2023   ALT 26 06/18/2023   AST 24 06/18/2023   NA 137 06/18/2023   K 4.0 06/18/2023   CL 99 06/18/2023   CREATININE 0.85 06/18/2023   BUN 10 06/18/2023   CO2 30 06/18/2023   TSH 0.99 06/18/2023   PSA 4.18 (H) 12/16/2022   HGBA1C 6.7 (H) 06/18/2023   MICROALBUR 0.7 10/19/2015    Lab Results  Component Value Date   TSH 0.99 06/18/2023   Lab Results  Component Value Date   WBC 8.3 06/18/2023   HGB 15.2 06/18/2023   HCT 47.1 06/18/2023   MCV 99.9 06/18/2023   PLT 194.0 06/18/2023   Lab Results  Component Value Date   NA 137 06/18/2023   K 4.0 06/18/2023   CO2 30 06/18/2023   GLUCOSE 139 (H) 06/18/2023   BUN 10 06/18/2023   CREATININE 0.85 06/18/2023   BILITOT 1.0 06/18/2023   ALKPHOS 54 06/18/2023   AST 24 06/18/2023   ALT 26 06/18/2023   PROT 6.7 06/18/2023   ALBUMIN 4.0 06/18/2023   CALCIUM 9.1 06/18/2023   ANIONGAP 11 07/03/2022   EGFR 94 08/05/2022   GFR 87.62 06/18/2023   Lab Results  Component Value Date   CHOL 130 06/18/2023   Lab Results  Component Value Date   HDL 59.60 06/18/2023   Lab Results  Component Value Date   LDLCALC 40 06/18/2023   Lab Results  Component Value Date   TRIG 151.0 (H) 06/18/2023   Lab Results  Component Value Date   CHOLHDL 2 06/18/2023   Lab Results  Component Value Date   HGBA1C 6.7 (H) 06/18/2023       Assessment & Plan:  Abnormal TSH Assessment & Plan: Continue to monitor  Orders: -     TSH  Persistent atrial fibrillation (HCC) Assessment & Plan: Rate controlled, tolerating Eliquis  Orders: -     Comprehensive metabolic panel  Essential hypertension Assessment & Plan: Well controlled, no changes to meds. Encouraged  heart healthy diet such as the DASH diet and exercise as tolerated.    Orders: -     Lipid panel -     Comprehensive metabolic panel -     CBC with Differential/Platelet -     TSH  Hyperglycemia Assessment & Plan: hgba1c acceptable, minimize simple carbs. Increase exercise as tolerated. Consider a glucometer for now he does not want to proceed. He will let us know if he wants good  Orders: -     Lipid panel -     Hemoglobin A1c  Controlled type 2 diabetes mellitus without complication, without long-term current use of insulin (HCC) Assessment & Plan: hgba1c acceptable, minimize simple carbs. Increase exercise as tolerated. Consider a glucometer for now he does not  want to proceed. He will let us know if he wants good     Assessment and Plan    Prostate Abnormality Suspicious areas identified on recent MRI, with a 40-50% chance of malignancy. Biopsy scheduled for 06/23/2023 to determine if cancer is present and, if so, its type and aggressiveness. - Proceed with scheduled biopsy.  Urinary Symptoms No significant change in urinary symptoms since switching from Tamsulosin to Silodosin for management. Patient reports waking up 2-3 times per night to urinate, but is able to return to sleep without difficulty. - Continue Silodosin as tolerated.  Weight Management Patient is 10-15 pounds overweight. Discussed the benefits of lifestyle changes and potential use of weight loss medications (Ozempic or Mounjaro) if no improvement in weight is seen in the next few months. - Patient to consider starting weight loss medication and inform the office via MyChart if he decides to proceed.  Congestion Patient reports chronic congestion, which he has experienced multiple times before. He has been taking Sudafed for symptom management. - Conduct COVID-19 test due to current high prevalence and overlapping symptoms.  General Health Maintenance - Discussed the importance of regular physical  activity, aiming for at least 4,000 steps on a "bad day" and ideally 8,000 or more for optimal health. - Discussed the importance of maintaining social connections for overall health. - Discussed the potential benefits of receiving a COVID-19 vaccine, given the ongoing pandemic and the patient's regular social interactions. - Conduct routine blood work today to ensure no signs of infection or kidney dysfunction prior to the upcoming biopsy.         Danise Edge, MD

## 2023-06-28 ENCOUNTER — Emergency Department (HOSPITAL_BASED_OUTPATIENT_CLINIC_OR_DEPARTMENT_OTHER)
Admission: EM | Admit: 2023-06-28 | Discharge: 2023-06-28 | Disposition: A | Discharge status: Home / Self Care | Payer: Medicare Other | Attending: Emergency Medicine | Admitting: Emergency Medicine

## 2023-06-28 ENCOUNTER — Other Ambulatory Visit: Payer: Self-pay

## 2023-06-28 DIAGNOSIS — R339 Retention of urine, unspecified: Secondary | ICD-10-CM | POA: Diagnosis not present

## 2023-06-28 DIAGNOSIS — Z7901 Long term (current) use of anticoagulants: Secondary | ICD-10-CM | POA: Diagnosis not present

## 2023-06-28 DIAGNOSIS — Z79899 Other long term (current) drug therapy: Secondary | ICD-10-CM | POA: Insufficient documentation

## 2023-06-28 LAB — URINALYSIS, ROUTINE W REFLEX MICROSCOPIC
Bilirubin Urine: NEGATIVE
Glucose, UA: NEGATIVE mg/dL
Hgb urine dipstick: NEGATIVE
Ketones, ur: NEGATIVE mg/dL
Leukocytes,Ua: NEGATIVE
Nitrite: NEGATIVE
Protein, ur: NEGATIVE mg/dL
Specific Gravity, Urine: 1.025 (ref 1.005–1.030)
pH: 6.5 (ref 5.0–8.0)

## 2023-06-28 NOTE — ED Provider Notes (Signed)
[place] [facility]PART[facility] AT [facility] Provider Note   CSN: [id number] Arrival date & time: 06/28/23  3818     History  Chief Complaint  Patient presents with   Urinary Retention    William Phillips is a 72 y.o. male.  HPI Patient ports he had prostate biopsy Tuesday, 5 days ago.  He reports that he was doing all right after the biopsy and he was urinating as normal but Friday, 2 days ago he is urine slowed way down and he was not passing much.  He reports he started getting increasing urgency to go with small amounts of urine.  No fever no chills no general nausea or vomiting.  He reports as of last night he has not been able to urinate since yesterday evening a small amount.  Patient reports has had increasing pressure in his suprapubic area and pain.  No pain in the back or the flank.  Patient denies he is ever had to have a Foley catheter placed previously.    Home Medications Prior to Admission medications   Medication Sig Start Date End Date Taking? Authorizing Provider  amLODipine (NORVASC) 10 MG tablet TAKE ONE-HALF TABLET BY MOUTH  TWICE DAILY 03/20/23   Bradd Canary, MD  apixaban (ELIQUIS) 5 MG TABS tablet TAKE 1 TABLET BY MOUTH TWICE  DAILY 06/03/23   Mealor, Roberts Gaudy, MD  Ascorbic Acid (VITAMIN C PO) Take 1 tablet by mouth daily.    [provider]  Brimonidine Tartrate (LUMIFY) 0.025 % SOLN Place 1 drop into both eyes every morning.    [provider]  Carboxymethylcellulose Sodium (LUBRICANT EYE DROPS OP) Place 1 drop into both eyes 2 (two) times daily as needed (Dry eye).    [provider]  clindamycin (CLEOCIN T) 1 % external solution Apply 1 Application topically 2 (two) times daily. 08/11/22   [provider]  Clobetasol Propionate 0.05 % shampoo Apply 1 application topically daily as needed (irritation). 04/11/21   [provider]  fexofenadine (ALLEGRA) 180 MG tablet Take 1 tablet (180 mg total) by  mouth daily. 10/23/20   Bradd Canary, MD  fluticasone (FLONASE) 50 MCG/ACT nasal spray Place 2 sprays into both nostrils daily as needed for allergies or rhinitis. 10/23/20   Bradd Canary, MD  ketoconazole (NIZORAL) 2 % shampoo Apply 1 application topically daily as needed for irritation. 04/09/21   [provider]  lisinopril-hydrochlorothiazide (ZESTORETIC) 20-12.5 MG tablet TAKE 1 TABLET BY MOUTH DAILY 01/12/23   Bradd Canary, MD  metoprolol succinate (TOPROL-XL) 50 MG 24 hr tablet TAKE 1 TABLET BY MOUTH TWICE  DAILY WITH OR IMMEDIATELY  FOLLOWING A MEAL 06/01/23   Bradd Canary, MD  Multiple Vitamin (MULTIVITAMIN) tablet Take 1 tablet by mouth every evening.    [provider]  omeprazole (PRILOSEC) 20 MG capsule TAKE 1 CAPSULE BY MOUTH DAILY 06/01/23   Bradd Canary, MD  rosuvastatin (CRESTOR) 40 MG tablet TAKE 1 TABLET BY MOUTH DAILY 06/03/23   Lewayne Bunting, MD  sildenafil (REVATIO) 20 MG tablet TAKE 1 TO 4 TABLETS BY MOUTH DAILY AS NEEDED 05/06/23   Bradd Canary, MD  silodosin (RAPAFLO) 8 MG CAPS capsule Take 1 capsule (8 mg total) by mouth daily with breakfast. 03/24/23   Stoneking, Danford Bad., MD  VITAMIN D PO Take 1 capsule by mouth daily.    [provider]      Allergies    Penicillins  Review of Systems   Review of Systems  Physical Exam Updated Vital Signs BP (!) 149/91   Pulse 80   Temp 97.7 F (36.5 C) (Oral)   Resp 18   SpO2 97%  Physical Exam Constitutional:      Comments: Alert nontoxic well in appearance.  HENT:     Mouth/Throat:     Pharynx: Oropharynx is clear.  Cardiovascular:     Rate and Rhythm: Normal rate and regular rhythm.  Pulmonary:     Effort: Pulmonary effort is normal.     Breath sounds: Normal breath sounds.  Abdominal:     Comments: Abdomen is distended.  Suprapubic tenderness.  Firm suprapubic palpation.  No CVA tenderness.  Genitourinary:    Comments: No groin fullness to palpation.  Nontender  inguinal area. Musculoskeletal:        General: No swelling.     Right lower leg: No edema.     Left lower leg: No edema.  Skin:    General: Skin is warm and dry.  Neurological:     General: No focal deficit present.     Mental Status: He is oriented to person, place, and time.     Motor: No weakness.     Coordination: Coordination normal.  Psychiatric:        Mood and Affect: Mood normal.     ED Results / Procedures / Treatments   Labs (all labs ordered are listed, but only abnormal results are displayed) Labs Reviewed  URINALYSIS, ROUTINE W REFLEX MICROSCOPIC    EKG None  Radiology No results found.  Procedures Procedures    Medications Ordered in ED Medications - No data to display  ED Course/ Medical Decision Making/ A&P                                 Medical Decision Making Amount and/or Complexity of Data Reviewed Labs: ordered.   Patient recent prostate biopsy.  3 days later he is having urinary retention.  He is nontoxic and otherwise clinically well in appearance.  Bladder scan shows 430 mL.  I have also used the bedside ultrasound and confirmed significant bladder distention, appears greater than 500 mL.  Will place Foley catheter.  Patient got immediate relief with placement of Foley catheter.  He has had almost a liter of urinary output.  Urinalysis clear.  Patient stable for discharge and follow-up.  He is already established with urology.  No signs of UTI.  Urinary retention appears secondary to prostatic hypertrophy.        Final Clinical Impression(s) / ED Diagnoses Final diagnoses:  Urinary retention    Rx / DC Orders ED Discharge Orders     None         Arby Barrette, MD 06/28/23 [id number]

## 2023-06-28 NOTE — ED Triage Notes (Signed)
Pt reports seeing urologist Tuesday for prostate biopsy. After visit he developed urinary retention. Unable to fully empty bladder since Tuesday evening.

## 2023-06-28 NOTE — Discharge Instructions (Addendum)
1.  Follow-up with your urologist for recheck as soon as possible. 2.  Return to the emergency department if you have any concerning symptoms.

## 2023-06-29 ENCOUNTER — Encounter: Payer: Self-pay | Admitting: Urology

## 2023-07-02 ENCOUNTER — Encounter: Payer: Self-pay | Admitting: Urology

## 2023-07-02 ENCOUNTER — Ambulatory Visit: Payer: Medicare Other | Admitting: Urology

## 2023-07-02 VITALS — BP 143/87 | HR 79 | Ht 72.0 in | Wt 210.0 lb

## 2023-07-02 DIAGNOSIS — C61 Malignant neoplasm of prostate: Secondary | ICD-10-CM

## 2023-07-02 DIAGNOSIS — N401 Enlarged prostate with lower urinary tract symptoms: Secondary | ICD-10-CM | POA: Diagnosis not present

## 2023-07-02 DIAGNOSIS — N138 Other obstructive and reflux uropathy: Secondary | ICD-10-CM | POA: Diagnosis not present

## 2023-07-02 DIAGNOSIS — R339 Retention of urine, unspecified: Secondary | ICD-10-CM

## 2023-07-02 NOTE — Progress Notes (Signed)
Assessment: 1. Prostate cancer (HCC); PSA 5.3; GG 1,2; unfavorable intermediate risk   2. BPH with obstruction/lower urinary tract symptoms   3. Urinary retention     Plan: Continue silodosin I spent a total of 40 minutes counseling [name] regarding the diagnosis of localized prostate cancer.  I spent the first 15 minutes discussing the biopsy results.  Using the [facility] prostate cancer nomogram, I cited a probability of 44 percent for localized prostate cancer, 55 percent of extracapsular extension, 8 percent of seminal vesicle involvement, and 7 percent of lymph node involvement.  I discussed the diagnosis of localized prostate cancer in the natural history of prostate cancer. I then spent the next 25 minutes discussing treatment options for localized prostate cancer.  Specifically, I discussed active surveillance, radical prostatectomy (RALP, RRP, RPP), external beam radiation, low dose rate brachytherapy, cryosurgery, and high intensity frequency ultrasound.  I discussed the risk and benefits of each treatment.  I discussed the potential risk of impotence and incontinence as they relate to treatment of prostate cancer.  Questions were answered.  The patient was given literature regarding prostate cancer to review.  Recommend additional staging evaluation with PSMA PET scan for unfavorable intermediate risk prostate cancer. Return to office on 07/06/23 for voiding trial  Chief Complaint:  Chief Complaint  Patient presents with   Prostate Cancer    History of Present Illness:  William Phillips is a 72 y.o. male who is seen for discussion of recent biopsy results showing prostate cancer. He has a history of elevated PSA and BPH with LUTS. PSA results: 1/17 1.63 2/20 2.22 5/21 2.33 9/23 5.41 3/24 4.18 6/24 5.3  No prior prostate biopsy.  No history of UTIs or prostatitis.  No family history of prostate cancer. MRI of the prostate from 05/28/2023 showed a volume of 54.37  cm and PI-RADS 4 lesions in the right mid gland and apex and left apex.  He underwent fusion guided biopsy on 06/23/2023. Prostate volume measured approximately 86 g. Biopsy results: Gleason 3+4, Gleason 3+3 adenocarcinoma in 3/7 cores on left and 6/7 cores on right.  Both regions of interest were positive.    He has a history of lower urinary tract symptoms for 4-5 years.  He had been on tamsulosin 0.4 mg for approximately 5 years.  He continued to have symptoms of frequency, voiding every 2 hours, occasional urgency and urge incontinence, and nocturia 2-5 times per night.  He reported voiding with a good stream.   No dysuria or gross hematuria. IPSS = 18. PVR = 181 ml.  He was given a trial of alfuzosin 10 mg daily at his visit in 3/24.  He reported difficulty voiding while on the medication and was changed to silodosin. He continued on silodosin with improvement in his urinary symptoms with the medication.  He noted some improvement in his urinary stream as well as decreased nocturia and frequency.  No dysuria or gross hematuria. IPSS = 16.   He developed urinary retention approximately 4-5 days after the biopsy.  He was seen in the emergency room and a Foley catheter was placed with return of approximately 2000 mL.  His catheter has been draining well.  No gross hematuria.   Portions of the above documentation were copied from a prior visit for review purposes only.   Past Medical History:  Past Medical History:  Diagnosis Date   Allergic rhinitis    Ankle fracture 1998   Atrial flutter (HCC)  a. s/p CTI ablation by Dr Johney Frame 11/2014   Back pain 08/05/2015   BPH (benign prostatic hyperplasia) 08/26/2015   Ganglion cyst of dorsum of right wrist 04/08/2017   GERD (gastroesophageal reflux disease)    Hammer toe of right foot 05/06/2013   History of carpal tunnel syndrome    Hyperlipidemia    Hypertension    Insect bite 04/23/2015   OA (osteoarthritis) 02/06/2013   knees    Pedal edema 04/08/2017   Preventative health care 04/27/2011   Had normal colonoscopy in 2011     Past Surgical History:  Past Surgical History:  Procedure Laterality Date   A-FLUTTER ABLATION N/A 09/12/2021   Procedure: A-FLUTTER ABLATION;  Surgeon: Hillis Range, MD;  Location: [facility] IN[facility]AS[facility];  Service: Cardiovascular;  Laterality: N/A;   ABLATION  11-24-14   CTI ablation by Dr Johney Frame   ANKLE SURGERY     ATRIAL FIBRILLATION ABLATION N/A 09/02/2022   Procedure: ATRIAL FIBRILLATION ABLATION;  Surgeon: Maurice Small, MD;  Location: [facility] INVASIVE CV LAB;  Service: Cardiovascular;  Laterality: N/A;   ATRIAL FLUTTER ABLATION N/A 11/24/2014   Procedure: ATRIAL FLUTTER ABLATION;  Surgeon: Hillis Range, MD;  Location: [facility] CATH LAB;  Service: Cardiovascular;  Laterality: N/A;   CARDIOVERSION N/A 07/14/2022   Procedure: CARDIOVERSION;  Surgeon: Chrystie Nose, MD;  Location: [facility] ENDOSCOPY;  Service: Cardiovascular;  Laterality: N/A;   Carpel tunnel     COLONOSCOPY  08/04/2002   Normal Exam- Dr Shelbie Proctor HERNIA REPAIR  2008   TONSILLECTOMY      Allergies:  Allergies  Allergen Reactions   Penicillins Swelling    irriation    Family History:  Family History  Problem Relation Age of Onset   Arthritis Mother    Stroke Mother    Coronary artery disease Father        aortic valve disease   Heart attack Father 36   COPD Father    Hypertension Father    Hyperlipidemia Father    Dementia Father    Cancer Sister 54       breast cancer   Diabetes Sister    Diabetes type II Maternal Grandfather    Diabetes Maternal Grandfather    Hyperlipidemia Other    Hypertension Other     Social History:  Social History   Tobacco Use   Smoking status: Former   Smokeless tobacco: Never   Tobacco comments:    5-10 pack year history  Vaping Use   Vaping status: Never Used  Substance Use Topics   Alcohol use: Yes    Alcohol/week: 10.0 standard drinks of alcohol    Types: 10  Glasses of wine per week    Comment: 1-2 drinks daily (wine)   Drug use: No    ROS: Constitutional:  Negative for fever, chills, weight loss CV: Negative for chest pain, previous MI, hypertension Respiratory:  Negative for shortness of breath, wheezing, sleep apnea, frequent cough GI:  Negative for nausea, vomiting, bloody stool, GERD  Physical exam: BP (!) 143/87   Pulse 79   Ht 6' (1.829 m)   Wt 210 lb (95.3 kg)   BMI 28.48 kg/m  GENERAL APPEARANCE:  Well appearing, well developed, well nourished, NAD HEENT:  Atraumatic, normocephalic, oropharynx clear NECK:  Supple without lymphadenopathy or thyromegaly ABDOMEN:  Soft, non-tender, no masses EXTREMITIES:  Moves all extremities well, without clubbing, cyanosis, or edema NEUROLOGIC:  Alert and oriented x 3, normal gait, CN II-XII grossly intact  MENTAL STATUS:  appropriate BACK:  Non-tender to palpation, No CVAT SKIN:  Warm, dry, and intact    Results: None

## 2023-07-02 NOTE — Patient Instructions (Addendum)
[email or web address]

## 2023-07-06 ENCOUNTER — Other Ambulatory Visit: Payer: Self-pay

## 2023-07-06 ENCOUNTER — Encounter: Payer: Self-pay | Admitting: Urology

## 2023-07-06 ENCOUNTER — Encounter (HOSPITAL_BASED_OUTPATIENT_CLINIC_OR_DEPARTMENT_OTHER): Payer: Self-pay

## 2023-07-06 ENCOUNTER — Emergency Department (HOSPITAL_BASED_OUTPATIENT_CLINIC_OR_DEPARTMENT_OTHER)
Admission: EM | Admit: 2023-07-06 | Discharge: 2023-07-07 | Disposition: A | Discharge status: Home / Self Care | Payer: Medicare Other | Attending: Emergency Medicine | Admitting: Emergency Medicine

## 2023-07-06 ENCOUNTER — Ambulatory Visit: Payer: Medicare Other | Admitting: Urology

## 2023-07-06 VITALS — BP 147/83 | HR 73 | Ht 72.0 in | Wt 210.0 lb

## 2023-07-06 DIAGNOSIS — I10 Essential (primary) hypertension: Secondary | ICD-10-CM | POA: Insufficient documentation

## 2023-07-06 DIAGNOSIS — N138 Other obstructive and reflux uropathy: Secondary | ICD-10-CM | POA: Diagnosis not present

## 2023-07-06 DIAGNOSIS — N401 Enlarged prostate with lower urinary tract symptoms: Secondary | ICD-10-CM | POA: Diagnosis not present

## 2023-07-06 DIAGNOSIS — N481 Balanitis: Secondary | ICD-10-CM | POA: Insufficient documentation

## 2023-07-06 DIAGNOSIS — C61 Malignant neoplasm of prostate: Secondary | ICD-10-CM

## 2023-07-06 DIAGNOSIS — R339 Retention of urine, unspecified: Secondary | ICD-10-CM

## 2023-07-06 DIAGNOSIS — Z7901 Long term (current) use of anticoagulants: Secondary | ICD-10-CM | POA: Insufficient documentation

## 2023-07-06 DIAGNOSIS — Z79899 Other long term (current) drug therapy: Secondary | ICD-10-CM | POA: Insufficient documentation

## 2023-07-06 LAB — BASIC METABOLIC PANEL
Anion gap: 14 (ref 5–15)
BUN: 11 mg/dL (ref 8–23)
CO2: 23 mmol/L (ref 22–32)
Calcium: 9.2 mg/dL (ref 8.9–10.3)
Chloride: 95 mmol/L — ABNORMAL LOW (ref 98–111)
Creatinine, Ser: 0.72 mg/dL (ref 0.61–1.24)
GFR, Estimated: >60 mL/min (ref >60)
Glucose, Bld: 154 mg/dL — ABNORMAL HIGH (ref 70–99)
Potassium: 3.4 mmol/L — ABNORMAL LOW (ref 3.5–5.1)
Sodium: 132 mmol/L — ABNORMAL LOW (ref 135–145)

## 2023-07-06 LAB — URINALYSIS, ROUTINE W REFLEX MICROSCOPIC
Bilirubin Urine: NEGATIVE
Glucose, UA: NEGATIVE mg/dL
Ketones, ur: 15 mg/dL — AB
Leukocytes,Ua: NEGATIVE
Nitrite: NEGATIVE
Protein, ur: NEGATIVE mg/dL
Specific Gravity, Urine: 1.015 (ref 1.005–1.030)
pH: 6.5 (ref 5.0–8.0)

## 2023-07-06 LAB — URINALYSIS, MICROSCOPIC (REFLEX): WBC, UA: NONE SEEN WBC/hpf (ref 0–5)

## 2023-07-06 LAB — CBC WITH DIFFERENTIAL/PLATELET
Abs Immature Granulocytes: 0.05 10*3/uL (ref 0.00–0.07)
Basophils Absolute: 0 10*3/uL (ref 0.0–0.1)
Basophils Relative: 0 %
Eosinophils Absolute: 0.6 10*3/uL — ABNORMAL HIGH (ref 0.0–0.5)
Eosinophils Relative: 6 %
HCT: 42.8 % (ref 39.0–52.0)
Hemoglobin: 14.7 g/dL (ref 13.0–17.0)
Immature Granulocytes: 1 %
Lymphocytes Relative: 17 %
Lymphs Abs: 1.8 10*3/uL (ref 0.7–4.0)
MCH: 32.7 pg (ref 26.0–34.0)
MCHC: 34.3 g/dL (ref 30.0–36.0)
MCV: 95.1 fL (ref 80.0–100.0)
Monocytes Absolute: 1 10*3/uL (ref 0.1–1.0)
Monocytes Relative: 9 %
Neutro Abs: 7.2 10*3/uL (ref 1.7–7.7)
Neutrophils Relative %: 67 %
Platelets: 289 10*3/uL (ref 150–400)
RBC: 4.5 MIL/uL (ref 4.22–5.81)
RDW: 11.9 % (ref 11.5–15.5)
WBC: 10.7 10*3/uL — ABNORMAL HIGH (ref 4.0–10.5)
nRBC: 0 % (ref 0.0–0.2)

## 2023-07-06 MED ORDER — CIPROFLOXACIN HCL 500 MG PO TABS
500.0000 mg | ORAL_TABLET | Freq: Once | ORAL | Status: AC
  Administered 2023-07-06: 500 mg via ORAL

## 2023-07-06 MED ORDER — SODIUM CHLORIDE 0.9 % IV SOLN: 1.0000 g | Freq: Once | INTRAVENOUS | Status: DC

## 2023-07-06 MED ORDER — OXYCODONE-ACETAMINOPHEN 5-325 MG PO TABS
1.0000 | ORAL_TABLET | Freq: Four times a day (QID) | ORAL | 0 refills | Status: AC | PRN
Start: 2023-07-06 — End: ?

## 2023-07-06 MED ORDER — CEPHALEXIN 500 MG PO CAPS: 500.0000 mg | ORAL_CAPSULE | Freq: Three times a day (TID) | ORAL | 0 refills | Status: DC

## 2023-07-06 MED ORDER — LIDOCAINE HCL URETHRAL/MUCOSAL 2 % EX GEL
1.0000 | Freq: Once | CUTANEOUS | Status: AC
  Administered 2023-07-06: 1 via URETHRAL
  Filled 2023-07-06: qty 11

## 2023-07-06 MED ORDER — CLOTRIMAZOLE 1 % EX CREA: 1.0000 | TOPICAL_CREAM | Freq: Two times a day (BID) | CUTANEOUS | 0 refills | Status: DC

## 2023-07-06 MED ORDER — SODIUM CHLORIDE 0.9 % IV SOLN
2.0000 g | Freq: Once | INTRAVENOUS | Status: AC
  Administered 2023-07-06: 2 g via INTRAVENOUS
  Filled 2023-07-06: qty 12.5

## 2023-07-06 NOTE — Progress Notes (Signed)
Fill and Pull Catheter Removal  Patient is present today for a catheter removal.  Patient was cleaned and prepped in a sterile fashion of sterile water/ saline was instilled into the bladder when the patient felt the urge to urinate, 10ml of water was then drained from the balloon.  A 16FR foley cath was removed from the bladder no complications were noted .  Patient as then given some time to void on their own.  Patient can void  on their own after some time.  Patient tolerated well.  Performed by: Idelle Crouch., CMA(AAMA)

## 2023-07-06 NOTE — ED Triage Notes (Signed)
Patient reports having his foley catheter removed today. Since having it removed he has increased urinary urgency with little to no output. Patient reports calling his urologist and them recommending him to come to the ER. Patient reports pain in groin area. Rated 8/10, intermittent.

## 2023-07-06 NOTE — Discharge Instructions (Signed)
You have urinary retention.  Please call urology for an appointment.  I am also concerned that you have infection in the tip of your penis.  Please take Keflex 500 mg 3 times daily for a week  I have prescribed clotrimazole cream twice daily for a week and please apply to the tip of your penis  See your urologist  Return to ER if the Foley is not draining, severe pain

## 2023-07-06 NOTE — ED Provider Notes (Signed)
[address] EMERGENCY DEPARTMENT AT [facility] Provider Note   CSN: [id number] Arrival date & time: 07/06/23  1944     History  Chief Complaint  Patient presents with   Urinary Retention    [name] is a 72 y.o. male history of hypertension, A-fib on Eliquis, here presenting with urinary retention.  Patient had urinary retention about a week ago.  Patient had Foley placed in the ER.  Patient went to Dr. Pete Glatter for follow-up today.  The Foley was removed today.  Patient was able to urinate in the office.  He states that he was unable to urinate since then.  He also noticed that the tip of his penis was also swollen.  He states has been given 1 dose of antibiotics in the office but was not discharged home with any.  The history is provided by the patient.       Home Medications Prior to Admission medications   Medication Sig Start Date End Date Taking? Authorizing Provider  amLODipine (NORVASC) 10 MG tablet TAKE ONE-HALF TABLET BY MOUTH  TWICE DAILY 03/20/23   Bradd Canary, MD  apixaban (ELIQUIS) 5 MG TABS tablet TAKE 1 TABLET BY MOUTH TWICE  DAILY 06/03/23   Mealor, Roberts Gaudy, MD  Ascorbic Acid (VITAMIN C PO) Take 1 tablet by mouth daily.    [provider]  Brimonidine Tartrate (LUMIFY) 0.025 % SOLN Place 1 drop into both eyes every morning.    [provider]  Carboxymethylcellulose Sodium (LUBRICANT EYE DROPS OP) Place 1 drop into both eyes 2 (two) times daily as needed (Dry eye).    [provider]  clindamycin (CLEOCIN T) 1 % external solution Apply 1 Application topically 2 (two) times daily. 08/11/22   [provider]  Clobetasol Propionate 0.05 % shampoo Apply 1 application topically daily as needed (irritation). 04/11/21   [provider]  fexofenadine (ALLEGRA) 180 MG tablet Take 1 tablet (180 mg total) by mouth daily. 10/23/20   Bradd Canary, MD  fluticasone (FLONASE) 50 MCG/ACT nasal spray Place 2 sprays  into both nostrils daily as needed for allergies or rhinitis. 10/23/20   Bradd Canary, MD  ketoconazole (NIZORAL) 2 % shampoo Apply 1 application topically daily as needed for irritation. 04/09/21   [provider]  lisinopril-hydrochlorothiazide (ZESTORETIC) 20-12.5 MG tablet TAKE 1 TABLET BY MOUTH DAILY 01/12/23   Bradd Canary, MD  metoprolol succinate (TOPROL-XL) 50 MG 24 hr tablet TAKE 1 TABLET BY MOUTH TWICE  DAILY WITH OR IMMEDIATELY  FOLLOWING A MEAL 06/01/23   Bradd Canary, MD  Multiple Vitamin (MULTIVITAMIN) tablet Take 1 tablet by mouth every evening.    [provider]  omeprazole (PRILOSEC) 20 MG capsule TAKE 1 CAPSULE BY MOUTH DAILY 06/01/23   Bradd Canary, MD  rosuvastatin (CRESTOR) 40 MG tablet TAKE 1 TABLET BY MOUTH DAILY 06/03/23   Lewayne Bunting, MD  sildenafil (REVATIO) 20 MG tablet TAKE 1 TO 4 TABLETS BY MOUTH DAILY AS NEEDED 05/06/23   Bradd Canary, MD  silodosin (RAPAFLO) 8 MG CAPS capsule Take 1 capsule (8 mg total) by mouth daily with breakfast. 03/24/23   Stoneking, Danford Bad., MD  VITAMIN D PO Take 1 capsule by mouth daily.    [provider]      Allergies    Penicillins    Review of Systems   Review of Systems  Genitourinary:  Positive for difficulty urinating and penile swelling.  All  other systems reviewed and are negative.   Physical Exam Updated Vital Signs BP 129/89 (BP Location: Right Arm)   Pulse 98   Temp 98.1 F (36.7 C)   Resp 20   SpO2 98%  Physical Exam Vitals and nursing note reviewed.  Constitutional:      Appearance: Normal appearance.  HENT:     Head: Normocephalic.     Nose: Nose normal.     Mouth/Throat:     Mouth: Mucous membranes are moist.  Eyes:     Extraocular Movements: Extraocular movements intact.     Pupils: Pupils are equal, round, and reactive to light.  Cardiovascular:     Rate and Rhythm: Normal rate.     Pulses: Normal pulses.  Pulmonary:     Effort: Pulmonary effort is normal.   Abdominal:     General: Abdomen is flat.     Comments: Patient has suprapubic tenderness with enlarged bladder  Genitourinary:    Comments: Tip of the penis is swollen with evidence of balanitis.  Patient also has swollen foreskin.  Patient has phimosis. patient is circumcised. Musculoskeletal:     Cervical back: Normal range of motion.  Skin:    General: Skin is warm.     Capillary Refill: Capillary refill takes less than 2 seconds.  Neurological:     Mental Status: He is alert.  Psychiatric:        Mood and Affect: Mood normal.        Behavior: Behavior normal.     ED Results / Procedures / Treatments   Labs (all labs ordered are listed, but only abnormal results are displayed) Labs Reviewed  URINALYSIS, ROUTINE W REFLEX MICROSCOPIC  CBC WITH DIFFERENTIAL/PLATELET  BASIC METABOLIC PANEL    EKG None  Radiology No results found.  Procedures Procedures    Medications Ordered in ED Medications  lidocaine (XYLOCAINE) 2 % jelly 1 Application (has no administration in time range)  ceFEPIme (MAXIPIME) 2 g in sodium chloride 0.9 % 100 mL IVPB (has no administration in time range)    ED Course/ Medical Decision Making/ A&P                                 Medical Decision Making William Phillips is a 72 y.o. male here presenting with urinary retention.  Patient also has evidence of balanitis and possible phimosis as well.  Patient does not have paraphimosis currently.  Patient does not have any crepitus in his inguinal area.  Patient has 580 cc in his bladder.  Plan to replace Foley.  Will also get CBC and BMP and give a dose of IV cefepime.  Patient has penicillin allergy.  I do not think he has Fournier's gangrene currently.  11:15 PM Patient had about a liter that came out from his Foley.  Urinalysis unremarkable.  CBC and BMP unremarkable.  Patient was given cefepime in the ED.  Will discharge home with Keflex.  Will also discharge home with course of pain medicine.   Urine culture sent  Problems Addressed: Balanitis: acute illness or injury Urinary retention: acute illness or injury  Amount and/or Complexity of Data Reviewed Labs: ordered. Decision-making details documented in ED Course.    Final Clinical Impression(s) / ED Diagnoses Final diagnoses:  None    Rx / DC Orders ED Discharge Orders     None         Charlynne Pander,  MD 07/06/23 2316

## 2023-07-06 NOTE — Progress Notes (Signed)
Assessment: 1. Urinary retention   2. Prostate cancer (HCC); PSA 5.3; GG 1,2; unfavorable intermediate risk   3. BPH with obstruction/lower urinary tract symptoms     Plan: Continue silodosin Foley removed today Cipro x 1 following foley removal Await results of PSMA PET scan Return to office in 7-10 days with bladder scan Patient advised to return to the office today if he is unable to void.  Chief Complaint:  Chief Complaint  Patient presents with   voiding trial    History of Present Illness:  William Phillips is a 72 y.o. male who is seen for discussion of recent biopsy results showing prostate cancer. He has a history of elevated PSA and BPH with LUTS. PSA results: 1/17 1.63 2/20 2.22 5/21 2.33 9/23 5.41 3/24 4.18 6/24 5.3  No prior prostate biopsy.  No history of UTIs or prostatitis.  No family history of prostate cancer. MRI of the prostate from 05/28/2023 showed a volume of 54.37 cm and PI-RADS 4 lesions in the right mid gland and apex and left apex.  He underwent fusion guided biopsy on 06/23/2023. Prostate volume measured approximately 86 g. Biopsy results: Gleason 3+4, Gleason 3+3 adenocarcinoma in 3/7 cores on left and 6/7 cores on right.  Both regions of interest were positive.    He has a history of lower urinary tract symptoms for 4-5 years.  He had been on tamsulosin 0.4 mg for approximately 5 years.  He continued to have symptoms of frequency, voiding every 2 hours, occasional urgency and urge incontinence, and nocturia 2-5 times per night.  He reported voiding with a good stream.   No dysuria or gross hematuria. IPSS = 18. PVR = 181 ml.  He was given a trial of alfuzosin 10 mg daily at his visit in 3/24.  He reported difficulty voiding while on the medication and was changed to silodosin. He continued on silodosin with improvement in his urinary symptoms with the medication.  He noted some improvement in his urinary stream as well as decreased  nocturia and frequency.  No dysuria or gross hematuria. IPSS = 16.   He developed urinary retention approximately 4-5 days after the biopsy.  He was seen in the emergency room and a Foley catheter was placed with return of approximately 2000 mL.  His catheter has been draining well.  No gross hematuria.  He presents today for a voiding trial.  Portions of the above documentation were copied from a prior visit for review purposes only.   Past Medical History:  Past Medical History:  Diagnosis Date   Allergic rhinitis    Ankle fracture 1998   Atrial flutter (HCC)    a. s/p CTI ablation by Dr Johney Frame 11/2014   Back pain 08/05/2015   BPH (benign prostatic hyperplasia) 08/26/2015   Ganglion cyst of dorsum of right wrist 04/08/2017   GERD (gastroesophageal reflux disease)    Hammer toe of right foot 05/06/2013   History of carpal tunnel syndrome    Hyperlipidemia    Hypertension    Insect bite 04/23/2015   OA (osteoarthritis) 02/06/2013   knees   Pedal edema 04/08/2017   Preventative health care 04/27/2011   Had normal colonoscopy in 2011     Past Surgical History:  Past Surgical History:  Procedure Laterality Date   A-FLUTTER ABLATION N/A 09/12/2021   Procedure: A-FLUTTER ABLATION;  Surgeon: Hillis Range, MD;  Location: [facility] INVASIVE CV LAB;  Service: Cardiovascular;  Laterality: N/A;   ABLATION  11-24-14  CTI ablation by Dr Johney Frame   ANKLE SURGERY     ATRIAL FIBRILLATION ABLATION N/A 09/02/2022   Procedure: ATRIAL FIBRILLATION ABLATION;  Surgeon: Maurice Small, MD;  Location: [facility] INVASIVE CV LAB;  Service: Cardiovascular;  Laterality: N/A;   ATRIAL FLUTTER ABLATION N/A 11/24/2014   Procedure: ATRIAL FLUTTER ABLATION;  Surgeon: Hillis Range, MD;  Location: [facility] CATH LAB;  Service: Cardiovascular;  Laterality: N/A;   CARDIOVERSION N/A 07/14/2022   Procedure: CARDIOVERSION;  Surgeon: Chrystie Nose, MD;  Location: [facility] ENDOSCOPY;  Service: Cardiovascular;  Laterality: N/A;   Carpel  tunnel     COLONOSCOPY  08/04/2002   Normal Exam- Dr Shelbie Proctor HERNIA REPAIR  2008   TONSILLECTOMY      Allergies:  Allergies  Allergen Reactions   Penicillins Swelling    irriation    Family History:  Family History  Problem Relation Age of Onset   Arthritis Mother    Stroke Mother    Coronary artery disease Father        aortic valve disease   Heart attack Father 79   COPD Father    Hypertension Father    Hyperlipidemia Father    Dementia Father    Cancer Sister 76       breast cancer   Diabetes Sister    Diabetes type II Maternal Grandfather    Diabetes Maternal Grandfather    Hyperlipidemia Other    Hypertension Other     Social History:  Social History   Tobacco Use   Smoking status: Former   Smokeless tobacco: Never   Tobacco comments:    5-10 pack year history  Vaping Use   Vaping status: Never Used  Substance Use Topics   Alcohol use: Yes    Alcohol/week: 10.0 standard drinks of alcohol    Types: 10 Glasses of wine per week    Comment: 1-2 drinks daily (wine)   Drug use: No    ROS: Constitutional:  Negative for fever, chills, weight loss CV: Negative for chest pain, previous MI, hypertension Respiratory:  Negative for shortness of breath, wheezing, sleep apnea, frequent cough GI:  Negative for nausea, vomiting, bloody stool, GERD  Physical exam: BP (!) 147/83   Pulse 73   Ht 6' (1.829 m)   Wt 210 lb (95.3 kg)   BMI 28.48 kg/m  GENERAL APPEARANCE:  Well appearing, well developed, well nourished, NAD HEENT:  Atraumatic, normocephalic, oropharynx clear NECK:  Supple without lymphadenopathy or thyromegaly ABDOMEN:  Soft, non-tender, no masses EXTREMITIES:  Moves all extremities well, without clubbing, cyanosis, or edema NEUROLOGIC:  Alert and oriented x 3, normal gait, CN II-XII grossly intact MENTAL STATUS:  appropriate BACK:  Non-tender to palpation, No CVAT SKIN:  Warm, dry, and intact    Results: None  Procedure:  VOIDING  TRIAL  A voiding trial was performed in the office today.   Volume of sterile water instilled: 400 mL Foley catheter removed intact. Volume voided by patient: 100 mL Instructed to return to office if has not voided by 4 PM

## 2023-07-08 LAB — URINE CULTURE: Culture: NO GROWTH

## 2023-07-13 ENCOUNTER — Ambulatory Visit (HOSPITAL_COMMUNITY)
Admission: RE | Admit: 2023-07-13 | Discharge: 2023-07-13 | Disposition: A | Discharge status: Home / Self Care | Payer: Medicare Other | Source: Ambulatory Visit | Attending: Urology | Admitting: Urology

## 2023-07-13 ENCOUNTER — Ambulatory Visit: Payer: Medicare Other | Admitting: Urology

## 2023-07-13 ENCOUNTER — Encounter: Payer: Self-pay | Admitting: Urology

## 2023-07-13 VITALS — BP 148/81 | HR 81 | Ht 72.0 in | Wt 210.0 lb

## 2023-07-13 DIAGNOSIS — N472 Paraphimosis: Secondary | ICD-10-CM

## 2023-07-13 DIAGNOSIS — R339 Retention of urine, unspecified: Secondary | ICD-10-CM | POA: Diagnosis not present

## 2023-07-13 DIAGNOSIS — N401 Enlarged prostate with lower urinary tract symptoms: Secondary | ICD-10-CM

## 2023-07-13 DIAGNOSIS — N138 Other obstructive and reflux uropathy: Secondary | ICD-10-CM | POA: Diagnosis not present

## 2023-07-13 DIAGNOSIS — C61 Malignant neoplasm of prostate: Secondary | ICD-10-CM | POA: Diagnosis present

## 2023-07-13 DIAGNOSIS — C7951 Secondary malignant neoplasm of bone: Secondary | ICD-10-CM | POA: Diagnosis not present

## 2023-07-13 MED ORDER — LEVOFLOXACIN 500 MG PO TABS: 500.0000 mg | ORAL_TABLET | Freq: Every day | ORAL | 0 refills | Status: DC

## 2023-07-13 MED ORDER — OXYCODONE-ACETAMINOPHEN 5-325 MG PO TABS: 1.0000 | ORAL_TABLET | Freq: Four times a day (QID) | ORAL | 0 refills | Status: DC | PRN

## 2023-07-13 MED ORDER — FLOTUFOLASTAT F 18 GALLIUM 296-5846 MBQ/ML IV SOLN
8.1230 | Freq: Once | INTRAVENOUS | Status: AC
  Administered 2023-07-13: 8.123 via INTRAVENOUS

## 2023-07-13 NOTE — Progress Notes (Signed)
Assessment: 1. Urinary retention   2. Paraphimosis   3. Prostate cancer (HCC); PSA 5.3; GG 1,2; unfavorable intermediate risk   4. BPH with obstruction/lower urinary tract symptoms     Plan: Paraphimosis was manually reduced in the office today. Given the degree of edema, I do not recommend that he retract the foreskin for several days.  Consequently, I do not think that teaching him intermittent catheterization is feasible at this time.  Since he is unable to void in the office, I recommended that we the replace the Foley catheter. Continue silodosin Begin Levaquin 500 mg daily x 7 days. Await results of PSMA PET scan -scheduled for later today. Patient instructed to avoid retraction of the foreskin. Apply antibiotic ointment to the foreskin as needed. Refill of pain medicine sent to his pharmacy. Return to office in 3 days for voiding trial and to learn intermittent catheterization.  Chief Complaint:  Chief Complaint  Patient presents with   Urinary Retention    History of Present Illness:  William Phillips is a 72 y.o. male who is seen for continued evaluation of urinary retention.  He has a history of elevated PSA and BPH with LUTS. PSA results: 1/17 1.63 2/20 2.22 5/21 2.33 9/23 5.41 3/24 4.18 6/24 5.3  No prior prostate biopsy.  No history of UTIs or prostatitis.  No family history of prostate cancer. MRI of the prostate from 05/28/2023 showed a volume of 54.37 cm and PI-RADS 4 lesions in the right mid gland and apex and left apex.  He underwent fusion guided biopsy on 06/23/2023. Prostate volume measured approximately 86 g. Biopsy results: Gleason 3+4, Gleason 3+3 adenocarcinoma in 3/7 cores on left and 6/7 cores on right.  Both regions of interest were positive.    He has a history of lower urinary tract symptoms for 4-5 years.  He had been on tamsulosin 0.4 mg for approximately 5 years.  He continued to have symptoms of frequency, voiding every 2 hours,  occasional urgency and urge incontinence, and nocturia 2-5 times per night.  He reported voiding with a good stream.   No dysuria or gross hematuria. IPSS = 18. PVR = 181 ml.  He was given a trial of alfuzosin 10 mg daily at his visit in 3/24.  He reported difficulty voiding while on the medication and was changed to silodosin. He continued on silodosin with improvement in his urinary symptoms with the medication.  He noted some improvement in his urinary stream as well as decreased nocturia and frequency.  No dysuria or gross hematuria. IPSS = 16.   He developed urinary retention approximately 4-5 days after the biopsy.  He was seen in the emergency room and a Foley catheter was placed with return of approximately 2000 mL.   His foley was removed on 07/06/23.  He returned to the ER that evening unable to void and his foley was replaced.  His catheter has been draining well.  No gross hematuria.  He presents today for a voiding trial. He was started on antibiotics in the emergency room.  Portions of the above documentation were copied from a prior visit for review purposes only.   Past Medical History:  Past Medical History:  Diagnosis Date   Allergic rhinitis    Ankle fracture 1998   Atrial flutter (HCC)    a. s/p CTI ablation by Dr Johney Frame 11/2014   Back pain 08/05/2015   BPH (benign prostatic hyperplasia) 08/26/2015   Ganglion cyst of dorsum of  right wrist 04/08/2017   GERD (gastroesophageal reflux disease)    Hammer toe of right foot 05/06/2013   History of carpal tunnel syndrome    Hyperlipidemia    Hypertension    Insect bite 04/23/2015   OA (osteoarthritis) 02/06/2013   knees   Pedal edema 04/08/2017   Preventative health care 04/27/2011   Had normal colonoscopy in 2011     Past Surgical History:  Past Surgical History:  Procedure Laterality Date   A-FLUTTER ABLATION N/A 09/12/2021   Procedure: A-FLUTTER ABLATION;  Surgeon: Hillis Range, MD;  [facility]: [facility];  Service: Cardiovascular;  Laterality: N/A;   ABLATION  11-24-14   CTI ablation by Dr Johney Frame   ANKLE SURGERY     ATRIAL FIBRILLATION ABLATION N/A 09/02/2022   Procedure: ATRIAL FIBRILLATION ABLATION;  Surgeon: Maurice Small, MD;  Location: [facility];  Service: Cardiovascular;  Laterality: N/A;   ATRIAL FLUTTER ABLATION N/A 11/24/2014   Procedure: ATRIAL FLUTTER ABLATION;  Surgeon: Hillis Range, MD;  Location: [facility] CATH LAB;  Service: Cardiovascular;  Laterality: N/A;   CARDIOVERSION N/A 07/14/2022   Procedure: CARDIOVERSION;  Surgeon: Chrystie Nose, MD;  Location: [facility] ENDOSCOPY;  Service: Cardiovascular;  Laterality: N/A;   Carpel tunnel     COLONOSCOPY  08/04/2002   Normal Exam- Dr Shelbie Proctor HERNIA REPAIR  2008   TONSILLECTOMY      Allergies:  Allergies  Allergen Reactions   Penicillins Swelling    irriation    Family History:  Family History  Problem Relation Age of Onset   Arthritis Mother    Stroke Mother    Coronary artery disease Father        aortic valve disease   Heart attack Father 74   COPD Father    Hypertension Father    Hyperlipidemia Father    Dementia Father    Cancer Sister 67       breast cancer   Diabetes Sister    Diabetes type II Maternal Grandfather    Diabetes Maternal Grandfather    Hyperlipidemia Other    Hypertension Other     Social History:  Social History   Tobacco Use   Smoking status: Former   Smokeless tobacco: Never   Tobacco comments:    5-10 pack year history  Vaping Use   Vaping status: Never Used  Substance Use Topics   Alcohol use: Yes    Alcohol/week: 10.0 standard drinks of alcohol    Types: 10 Glasses of wine per week    Comment: 1-2 drinks daily (wine)   Drug use: No    ROS: Constitutional:  Negative for fever, chills, weight loss CV: Negative for chest pain, previous MI, hypertension Respiratory:  Negative for shortness of breath, wheezing, sleep apnea, frequent cough GI:  Negative for  nausea, vomiting, bloody stool, GERD  Physical exam: BP (!) 148/81   Pulse 81   Ht 6' (1.829 m)   Wt 210 lb (95.3 kg)   BMI 28.48 kg/m  GENERAL APPEARANCE:  Well appearing, well developed, well nourished, NAD HEENT:  Atraumatic, normocephalic, oropharynx clear NECK:  Supple without lymphadenopathy or thyromegaly ABDOMEN:  Soft, non-tender, no masses EXTREMITIES:  Moves all extremities well, without clubbing, cyanosis, or edema NEUROLOGIC:  Alert and oriented x 3, normal gait, CN II-XII grossly intact MENTAL STATUS:  appropriate BACK:  Non-tender to palpation, No CVAT SKIN:  Warm, dry, and intact GU:  significant penile edema due to paraphimosis - paraphimosis  reduced    Results: None  Procedure:  VOIDING TRIAL  A voiding trial was performed in the office today.   Volume of sterile water instilled: 340 mL Foley catheter removed intact. Volume voided by patient: 50 mL Instructed to return to office if has not voided by 4 PM  Procedure: Foley insertion  Foley catheter insertion is accomplished under sterile conditions using a 16 Fr. catheter. Ten ml of sterile water is left in the retention balloon. There is the immediate return of 300 ml of urine. Tolerated well without complications. Foley catheter is left to gravity drainage.

## 2023-07-13 NOTE — Progress Notes (Addendum)
Fill and Pull Catheter Removal  Patient is present today for a catheter removal.  Patient was cleaned and prepped in a sterile fashion of sterile water was instilled into the bladder when the patient felt the urge to urinate, 9mL of water was then drained from the balloon.  A 16FR foley cath was removed from the bladder no complications were noted .  Patient was then given some time to void on their own.  Patient can void  50ml on their own after some time.  Patient tolerated well.  Performed by: Arville Go CMA

## 2023-07-15 NOTE — Progress Notes (Deleted)
HPI: FU Atrial fibrillation/Atrial flutter. Abdominal ultrasound April 2019 showed no aneurysm.  Previously diagnosed with atrial flutter in December 2015.  He subsequently had atrial flutter ablation February 2016.  Patient previously developed COVID and developed recurrent atrial flutter.  Echocardiogram repeated September 2022 and showed normal LV function, grade 1 diastolic dysfunction.  He had atrial flutter ablation December 2022.  CT prior to atrial fibrillation ablation November 2023 showed no left atrial appendage thrombus and calcium score 462 which was 71st percentile.  Patient had ablation September 02, 2022.  Since last seen   Current Outpatient Medications  Medication Sig Dispense Refill   amLODipine (NORVASC) 10 MG tablet TAKE ONE-HALF TABLET BY MOUTH  TWICE DAILY 100 tablet 2   apixaban (ELIQUIS) 5 MG TABS tablet TAKE 1 TABLET BY MOUTH TWICE  DAILY 200 tablet 1   Ascorbic Acid (VITAMIN C PO) Take 1 tablet by mouth daily.     Brimonidine Tartrate (LUMIFY) 0.025 % SOLN Place 1 drop into both eyes every morning.     Carboxymethylcellulose Sodium (LUBRICANT EYE DROPS OP) Place 1 drop into both eyes 2 (two) times daily as needed (Dry eye).     cephALEXin (KEFLEX) 500 MG capsule Take 1 capsule (500 mg total) by mouth 3 (three) times daily. 21 capsule 0   clindamycin (CLEOCIN T) 1 % external solution Apply 1 Application topically 2 (two) times daily.     Clobetasol Propionate 0.05 % shampoo Apply 1 application topically daily as needed (irritation).     clotrimazole (CLOTRIMAZOLE ANTI-FUNGAL) 1 % cream Apply 1 Application topically 2 (two) times daily. 30 g 0   fexofenadine (ALLEGRA) 180 MG tablet Take 1 tablet (180 mg total) by mouth daily.     fluticasone (FLONASE) 50 MCG/ACT nasal spray Place 2 sprays into both nostrils daily as needed for allergies or rhinitis.  2   ketoconazole (NIZORAL) 2 % shampoo Apply 1 application topically daily as needed for irritation.     levofloxacin  (LEVAQUIN) 500 MG tablet Take 1 tablet (500 mg total) by mouth daily. 7 tablet 0   lisinopril-hydrochlorothiazide (ZESTORETIC) 20-12.5 MG tablet TAKE 1 TABLET BY MOUTH DAILY 100 tablet 2   metoprolol succinate (TOPROL-XL) 50 MG 24 hr tablet TAKE 1 TABLET BY MOUTH TWICE  DAILY WITH OR IMMEDIATELY  FOLLOWING A MEAL 200 tablet 2   Multiple Vitamin (MULTIVITAMIN) tablet Take 1 tablet by mouth every evening.     omeprazole (PRILOSEC) 20 MG capsule TAKE 1 CAPSULE BY MOUTH DAILY 100 capsule 2   oxyCODONE-acetaminophen (PERCOCET) 5-325 MG tablet Take 1 tablet by mouth every 6 (six) hours as needed. 10 tablet 0   rosuvastatin (CRESTOR) 40 MG tablet TAKE 1 TABLET BY MOUTH DAILY 100 tablet 1   sildenafil (REVATIO) 20 MG tablet TAKE 1 TO 4 TABLETS BY MOUTH DAILY AS NEEDED 90 tablet 0   silodosin (RAPAFLO) 8 MG CAPS capsule Take 1 capsule (8 mg total) by mouth daily with breakfast. 90 capsule 3   VITAMIN D PO Take 1 capsule by mouth daily.     No current facility-administered medications for this visit.     Past Medical History:  Diagnosis Date   Allergic rhinitis    Ankle fracture 1998   Atrial flutter (HCC)    a. s/p CTI ablation by Dr Johney Frame 11/2014   Back pain 08/05/2015   BPH (benign prostatic hyperplasia) 08/26/2015   Ganglion cyst of dorsum of right wrist 04/08/2017   GERD (gastroesophageal reflux disease)  Hammer toe of right foot 05/06/2013   History of carpal tunnel syndrome    Hyperlipidemia    Hypertension    Insect bite 04/23/2015   OA (osteoarthritis) 02/06/2013   knees   Pedal edema 04/08/2017   Preventative health care 04/27/2011   Had normal colonoscopy in 2011     Past Surgical History:  Procedure Laterality Date   A-FLUTTER ABLATION N/A 09/12/2021   Procedure: A-FLUTTER ABLATION;  Surgeon: Hillis Range, MD;  Location: [facility];  Service: Cardiovascular;  Laterality: N/A;   ABLATION  11-24-14   CTI ablation by Dr Johney Frame   ANKLE SURGERY     ATRIAL  FIBRILLATION ABLATION N/A 09/02/2022   Procedure: ATRIAL FIBRILLATION ABLATION;  Surgeon: Maurice Small, MD;  Location: [facility];  Service: Cardiovascular;  Laterality: N/A;   ATRIAL FLUTTER ABLATION N/A 11/24/2014   Procedure: ATRIAL FLUTTER ABLATION;  Surgeon: Hillis Range, MD;  Location: [facility] CATH LAB;  Service: Cardiovascular;  Laterality: N/A;   CARDIOVERSION N/A 07/14/2022   Procedure: CARDIOVERSION;  Surgeon: Chrystie Nose, MD;  Location: [facility] ENDOSCOPY;  Service: Cardiovascular;  Laterality: N/A;   Carpel tunnel     COLONOSCOPY  08/04/2002   Normal Exam- Dr Shelbie Proctor HERNIA REPAIR  2008   TONSILLECTOMY      Social History   Socioeconomic History   Marital status: Married    Spouse name: Not on file   Number of children: 1   Years of education: Not on file   Highest education level: Not on file  Occupational History    Comment: Sales  Tobacco Use   Smoking status: Former   Smokeless tobacco: Never   Tobacco comments:    5-10 pack year history  Vaping Use   Vaping status: Never Used  Substance and Sexual Activity   Alcohol use: Yes    Alcohol/week: 10.0 standard drinks of alcohol    Types: 10 Glasses of wine per week    Comment: 1-2 drinks daily (wine)   Drug use: No   Sexual activity: Yes  Other Topics Concern   Not on file  Social History Narrative   Occupation: Airline pilot  Architect- chemicals, seed, erosion control)   Married- 35 years   21 daughter -Holiday representative in   ([place]   Previous smoker - 5-10 pack yrs     Alcohol use-yes (2-3 glasses of wine)     Social Determinants of Health   Financial Resource Strain: Low Risk  (10/01/2021)   Overall Financial Resource Strain (CARDIA)    Difficulty of Paying Living Expenses: Not hard at all  Food Insecurity: No Food Insecurity (10/01/2021)   Hunger Vital Sign    Worried About Running Out of Food in the Last Year: Never true    Ran Out of Food in the Last Year: Never true   Transportation Needs: No Transportation Needs (10/01/2021)   PRAPARE - Administrator, Civil Service (Medical): No    Lack of Transportation (Non-Medical): No  Physical Activity: Not on file  Stress: No Stress Concern Present (10/01/2021)   [employer] of Occupational Health - Occupational Stress Questionnaire    Feeling of Stress : Not at all  Social Connections: Socially Integrated (10/01/2021)   Social Connection and Isolation Panel [NHANES]    Frequency of Communication with Friends and Family: More than three times a week    Frequency of Social Gatherings with Friends and Family: More than three times a week  Attends Religious Services: More than 4 times per year    Active Member of Clubs or Organizations: Yes    Attends Banker Meetings: More than 4 times per year    Marital Status: Married  Catering manager Violence: Not At Risk (10/01/2021)   Humiliation, Afraid, Rape, and Kick questionnaire    Fear of Current or Ex-Partner: No    Emotionally Abused: No    Physically Abused: No    Sexually Abused: No    Family History  Problem Relation Age of Onset   Arthritis Mother    Stroke Mother    Coronary artery disease Father        aortic valve disease   Heart attack Father 29   COPD Father    Hypertension Father    Hyperlipidemia Father    Dementia Father    Cancer Sister 36       breast cancer   Diabetes Sister    Diabetes type II Maternal Grandfather    Diabetes Maternal Grandfather    Hyperlipidemia Other    Hypertension Other     ROS: no fevers or chills, productive cough, hemoptysis, dysphasia, odynophagia, melena, hematochezia, dysuria, hematuria, rash, seizure activity, orthopnea, PND, pedal edema, claudication. Remaining systems are negative.  Physical Exam: Well-developed well-nourished in no acute distress.  Skin is warm and dry.  HEENT is normal.  Neck is supple.  Chest is clear to auscultation with normal expansion.   Cardiovascular exam is regular rate and rhythm.  Abdominal exam nontender or distended. No masses palpated. Extremities show no edema. neuro grossly intact  ECG- personally reviewed  A/P  1 paroxysmal atrial fibrillation-patient is status post ablation and remains in sinus rhythm.  Continue Toprol and apixaban at present dose.  2 coronary calcification-continue statin.  No aspirin given need for apixaban.  3 hyperlipidemia-continue statin.  4 hypertension-blood pressure controlled.  Continue present medical regimen.  5 history of atrial flutter status post ablation  Olga Millers, MD

## 2023-07-16 ENCOUNTER — Ambulatory Visit: Payer: Medicare Other | Admitting: Urology

## 2023-07-16 ENCOUNTER — Encounter: Payer: Self-pay | Admitting: Urology

## 2023-07-16 VITALS — BP 145/81 | HR 80

## 2023-07-16 DIAGNOSIS — N401 Enlarged prostate with lower urinary tract symptoms: Secondary | ICD-10-CM

## 2023-07-16 DIAGNOSIS — N138 Other obstructive and reflux uropathy: Secondary | ICD-10-CM | POA: Diagnosis not present

## 2023-07-16 DIAGNOSIS — N472 Paraphimosis: Secondary | ICD-10-CM | POA: Diagnosis not present

## 2023-07-16 DIAGNOSIS — R339 Retention of urine, unspecified: Secondary | ICD-10-CM | POA: Diagnosis not present

## 2023-07-16 DIAGNOSIS — C61 Malignant neoplasm of prostate: Secondary | ICD-10-CM | POA: Diagnosis not present

## 2023-07-16 NOTE — Progress Notes (Signed)
Assessment: 1. Urinary retention   2. Paraphimosis   3. Prostate cancer (HCC); PSA 5.3; GG 1,2; unfavorable intermediate risk   4. BPH with obstruction/lower urinary tract symptoms      Plan: Continue silodosin Complete Levaquin 500 mg daily x 7 days. Await results of PSMA PET scan  Patient instructed to avoid retraction of the foreskin. Apply antibiotic ointment to the foreskin as needed. Patient instructed on CIC.  He will perform this TID and prn. Return to office in 1 week.  Chief Complaint:  Chief Complaint  Patient presents with   Urinary Retention    History of Present Illness:  William Phillips is a 72 y.o. male who is seen for continued evaluation of urinary retention.  He has a history of elevated PSA and BPH with LUTS. PSA results: 1/17 1.63 2/20 2.22 5/21 2.33 9/23 5.41 3/24 4.18 6/24 5.3  No prior prostate biopsy.  No history of UTIs or prostatitis.  No family history of prostate cancer. MRI of the prostate from 05/28/2023 showed a volume of 54.37 cm and PI-RADS 4 lesions in the right mid gland and apex and left apex.  He underwent fusion guided biopsy on 06/23/2023. Prostate volume measured approximately 86 g. Biopsy results: Gleason 3+4, Gleason 3+3 adenocarcinoma in 3/7 cores on left and 6/7 cores on right.  Both regions of interest were positive.    He has a history of lower urinary tract symptoms for 4-5 years.  He had been on tamsulosin 0.4 mg for approximately 5 years.  He continued to have symptoms of frequency, voiding every 2 hours, occasional urgency and urge incontinence, and nocturia 2-5 times per night.  He reported voiding with a good stream.   No dysuria or gross hematuria. IPSS = 18. PVR = 181 ml.  He was given a trial of alfuzosin 10 mg daily at his visit in 3/24.  He reported difficulty voiding while on the medication and was changed to silodosin. He continued on silodosin with improvement in his urinary symptoms with the  medication.  He noted some improvement in his urinary stream as well as decreased nocturia and frequency.  No dysuria or gross hematuria. IPSS = 16.   He developed urinary retention approximately 4-5 days after the biopsy.  He was seen in the emergency room and a Foley catheter was placed with return of approximately 2000 mL.   His foley was removed on 07/06/23.  He returned to the ER that evening unable to void and his foley was replaced.  He presented for a voiding trial on 07/13/23.  He was able to void only a small amount.  He was also found to have paraphimosis which was reduced.   Due to the significant penile edema, his foley was replaced. He presents today for re-evaluation and instruction on self catheterization.  Portions of the above documentation were copied from a prior visit for review purposes only.   Past Medical History:  Past Medical History:  Diagnosis Date   Allergic rhinitis    Ankle fracture 1998   Atrial flutter (HCC)    a. s/p CTI ablation by Dr Johney Frame 11/2014   Back pain 08/05/2015   BPH (benign prostatic hyperplasia) 08/26/2015   Ganglion cyst of dorsum of right wrist 04/08/2017   GERD (gastroesophageal reflux disease)    Hammer toe of right foot 05/06/2013   History of carpal tunnel syndrome    Hyperlipidemia    Hypertension    Insect bite 04/23/2015   OA (  osteoarthritis) 02/06/2013   knees   Pedal edema 04/08/2017   Preventative health care 04/27/2011   Had normal colonoscopy in 2011     Past Surgical History:  Past Surgical History:  Procedure Laterality Date   A-FLUTTER ABLATION N/A 09/12/2021   Procedure: A-FLUTTER ABLATION;  Surgeon: Hillis Range, MD;  Location: [facility];  Service: Cardiovascular;  Laterality: N/A;   ABLATION  11-24-14   CTI ablation by Dr Johney Frame   ANKLE SURGERY     ATRIAL FIBRILLATION ABLATION N/A 09/02/2022   Procedure: ATRIAL FIBRILLATION ABLATION;  Surgeon: Maurice Small, MD;  Location: [facility] LAB;   Service: Cardiovascular;  Laterality: N/A;   ATRIAL FLUTTER ABLATION N/A 11/24/2014   Procedure: ATRIAL FLUTTER ABLATION;  Surgeon: Hillis Range, MD;  Location: [facility] CATH LAB;  Service: Cardiovascular;  Laterality: N/A;   CARDIOVERSION N/A 07/14/2022   Procedure: CARDIOVERSION;  Surgeon: Chrystie Nose, MD;  Location: [facility] ENDOSCOPY;  Service: Cardiovascular;  Laterality: N/A;   Carpel tunnel     COLONOSCOPY  08/04/2002   Normal Exam- Dr Shelbie Proctor HERNIA REPAIR  2008   TONSILLECTOMY      Allergies:  Allergies  Allergen Reactions   Penicillins Swelling    irriation    Family History:  Family History  Problem Relation Age of Onset   Arthritis Mother    Stroke Mother    Coronary artery disease Father        aortic valve disease   Heart attack Father 50   COPD Father    Hypertension Father    Hyperlipidemia Father    Dementia Father    Cancer Sister 39       breast cancer   Diabetes Sister    Diabetes type II Maternal Grandfather    Diabetes Maternal Grandfather    Hyperlipidemia Other    Hypertension Other     Social History:  Social History   Tobacco Use   Smoking status: Former   Smokeless tobacco: Never   Tobacco comments:    5-10 pack year history  Vaping Use   Vaping status: Never Used  Substance Use Topics   Alcohol use: Yes    Alcohol/week: 10.0 standard drinks of alcohol    Types: 10 Glasses of wine per week    Comment: 1-2 drinks daily (wine)   Drug use: No    ROS: Constitutional:  Negative for fever, chills, weight loss CV: Negative for chest pain, previous MI, hypertension Respiratory:  Negative for shortness of breath, wheezing, sleep apnea, frequent cough GI:  Negative for nausea, vomiting, bloody stool, GERD  Physical exam: BP (!) 145/81   Pulse 80  GU: decreased edema of foreskin and glans; foley draining clear urine   Results: None

## 2023-07-16 NOTE — Progress Notes (Signed)
Continuous Intermittent Catheterization  Due to urinary retention patient is present today for a teaching of self I & O Catheterization. Patient was given detailed verbal and printed instructions of self catheterization. Patient was cleaned and prepped in a sterile fashion.  With instruction and assistance patient inserted a 16FR and urine return was noted 200 mL, urine was clear in color. Patient tolerated well, no complications were noted Patient was given a sample bag with supplies to take home.  Instructions were given per Dr Pete Glatter for patient to cath 3 times daily.  An order was placed with [facility] for catheters to be sent to the patient's home.   Performed by: Arville Go CMA

## 2023-07-21 NOTE — Addendum Note (Signed)
Addended by: Milderd Meager on: 07/21/2023 01:18 PM   Modules accepted: Orders

## 2023-07-22 ENCOUNTER — Encounter: Payer: Self-pay | Admitting: *Deleted

## 2023-07-22 DIAGNOSIS — R339 Retention of urine, unspecified: Secondary | ICD-10-CM | POA: Diagnosis not present

## 2023-07-22 NOTE — Progress Notes (Signed)
Reached out to William Phillips to introduce myself as the office RN Navigator and explain our new patient process. Reviewed the reason for their referral and scheduled their new patient appointment along with labs. Provided address and directions to the office including call back phone number. Reviewed with patient any concerns they may have or any possible barriers to attending their appointment.   Informed patient about my role as a navigator and that I will meet with them prior to their New Patient appointment and more fully discuss what services I can provide. At this time patient has no further questions or needs.    Oncology Nurse Navigator Documentation     07/22/2023   12:30 PM  Oncology Nurse Navigator Flowsheets  Abnormal Finding Date 05/28/2023  Confirmed Diagnosis Date 06/24/2023  Navigator Follow Up Date: 07/29/2023  Navigator Follow Up Reason: New Patient Appointment  Navigator Location [facility]  Referral Date to RadOnc/MedOnc 07/21/2023  Navigator Encounter Type Introductory Phone Call  Treatment Phase Pre-Tx/Tx Discussion  Barriers/Navigation Needs Coordination of Care;Education  Education Other  Interventions Coordination of Care;Education  Acuity Level 2-Minimal Needs (1-2 Barriers Identified)  Coordination of Care Appts  Education Method Verbal  Time Spent with Patient 30

## 2023-07-23 ENCOUNTER — Ambulatory Visit: Payer: Medicare Other | Admitting: Urology

## 2023-07-23 ENCOUNTER — Encounter: Payer: Self-pay | Admitting: Urology

## 2023-07-23 VITALS — BP 134/76 | HR 75 | Ht 72.0 in | Wt 210.0 lb

## 2023-07-23 DIAGNOSIS — N138 Other obstructive and reflux uropathy: Secondary | ICD-10-CM

## 2023-07-23 DIAGNOSIS — C7951 Secondary malignant neoplasm of bone: Secondary | ICD-10-CM | POA: Diagnosis not present

## 2023-07-23 DIAGNOSIS — N401 Enlarged prostate with lower urinary tract symptoms: Secondary | ICD-10-CM | POA: Diagnosis not present

## 2023-07-23 DIAGNOSIS — C61 Malignant neoplasm of prostate: Secondary | ICD-10-CM

## 2023-07-23 DIAGNOSIS — Z87438 Personal history of other diseases of male genital organs: Secondary | ICD-10-CM

## 2023-07-23 DIAGNOSIS — R339 Retention of urine, unspecified: Secondary | ICD-10-CM | POA: Diagnosis not present

## 2023-07-23 DIAGNOSIS — N472 Paraphimosis: Secondary | ICD-10-CM

## 2023-07-23 MED ORDER — DEGARELIX ACETATE(240 MG DOSE) 120 MG/VIAL ~~LOC~~ SOLR
240.0000 mg | Freq: Once | SUBCUTANEOUS | Status: AC
Start: 2023-07-23 — End: 2023-07-23
  Administered 2023-07-23: 240 mg via SUBCUTANEOUS

## 2023-07-23 NOTE — Progress Notes (Signed)
Assessment: 1. Prostate cancer (HCC); PSA 5.3; GG 1,2; unfavorable intermediate risk   2. 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 Urinary retention   4. Paraphimosis   5. BPH with obstruction/lower urinary tract symptoms   6. Primary prostate cancer with metastasis from prostate to other site [facility])     Plan: Continue silodosin Continue CIC TID Initiate androgen deprivation therapy today with the degarelix 120 mg  Referral to oncology to assist with management of metastatic prostate cancer Return to office in 1 month for next injection  Chief Complaint:  Chief Complaint  Patient presents with   Prostate Cancer    History of Present Illness:  William Phillips is a 72 y.o. male who is seen for continued evaluation of prostate cancer and urinary retention.  He has a history of elevated PSA and BPH with LUTS. PSA results: 1/17 1.63 2/20 2.22 5/21 2.33 9/23 5.41 3/24 4.18 6/24 5.3  No prior prostate biopsy.  No history of UTIs or prostatitis.  No family history of prostate cancer. MRI of the prostate from 05/28/2023 showed a volume of 54.37 cm and PI-RADS 4 lesions in the right mid gland and apex and left apex.  He underwent fusion guided biopsy on 06/23/2023. Prostate volume measured approximately 86 g. Biopsy results: Gleason 3+4, Gleason 3+3 adenocarcinoma in 3/7 cores on left and 6/7 cores on right.  Both regions of interest were positive.  PSMA PET scan from 07/13/2023 showed avid uptake in the prostate with multifocal osseous metastases involving the posterior left iliac, right iliac, and L1 vertebral body.   He has a history of lower urinary tract symptoms for 4-5 years.  He had been on tamsulosin 0.4 mg for approximately 5 years.  He continued to have symptoms of frequency, voiding every 2 hours, occasional urgency and urge incontinence, and nocturia 2-5 times per night.  He reported voiding with a good stream.   No dysuria or gross hematuria. IPSS =  18. PVR = 181 ml.  He was given a trial of alfuzosin 10 mg daily at his visit in 3/24.  He reported difficulty voiding while on the medication and was changed to silodosin. He continued on silodosin with improvement in his urinary symptoms with the medication.  He noted some improvement in his urinary stream as well as decreased nocturia and frequency.  No dysuria or gross hematuria. IPSS = 16.   He developed urinary retention approximately 4-5 days after the biopsy.  He was seen in the emergency room and a Foley catheter was placed with return of approximately 2000 mL.   His foley was removed on 07/06/23.  He returned to the ER that evening unable to void and his foley was replaced.  He presented for a voiding trial on 07/13/23.  He was able to void only a small amount.  He was also found to have paraphimosis which was reduced.   Due to the significant penile edema, his foley was replaced. His Foley catheter was removed and he was started on intermittent catheterization on 07/16/2023.  Portions of the above documentation were copied from a prior visit for review purposes only.   Past Medical History:  Past Medical History:  Diagnosis Date   Allergic rhinitis    Ankle fracture 1998   Atrial flutter (HCC)    a. s/p CTI ablation by Dr Johney Frame 11/2014   Back pain 08/05/2015   BPH (benign prostatic hyperplasia) 08/26/2015   Ganglion cyst of dorsum of right wrist  04/08/2017   GERD (gastroesophageal reflux disease)    Hammer toe of right foot 05/06/2013   History of carpal tunnel syndrome    Hyperlipidemia    Hypertension    Insect bite 04/23/2015   OA (osteoarthritis) 02/06/2013   knees   Pedal edema 04/08/2017   Preventative health care 04/27/2011   Had normal colonoscopy in 2011     Past Surgical History:  Past Surgical History:  Procedure Laterality Date   A-FLUTTER ABLATION N/A 09/12/2021   Procedure: A-FLUTTER ABLATION;  Surgeon: Hillis Range, MD;  [facility];   Service: Cardiovascular;  Laterality: N/A;   ABLATION  11-24-14   CTI ablation by Dr Johney Frame   ANKLE SURGERY     ATRIAL FIBRILLATION ABLATION N/A 09/02/2022   Procedure: ATRIAL FIBRILLATION ABLATION;  Surgeon: Maurice Small, MD;  [facility]: [facility];  Service: Cardiovascular;  Laterality: N/A;   ATRIAL FLUTTER ABLATION N/A 11/24/2014   Procedure: ATRIAL FLUTTER ABLATION;  Surgeon: Hillis Range, MD;  Location: [facility] CATH LAB;  Service: Cardiovascular;  Laterality: N/A;   CARDIOVERSION N/A 07/14/2022   Procedure: CARDIOVERSION;  Surgeon: Chrystie Nose, MD;  Location: [facility] ENDOSCOPY;  Service: Cardiovascular;  Laterality: N/A;   Carpel tunnel     COLONOSCOPY  08/04/2002   Normal Exam- Dr Shelbie Proctor HERNIA REPAIR  2008   TONSILLECTOMY      Allergies:  Allergies  Allergen Reactions   Penicillins Swelling    irriation    Family History:  Family History  Problem Relation Age of Onset   Arthritis Mother    Stroke Mother    Coronary artery disease Father        aortic valve disease   Heart attack Father 27   COPD Father    Hypertension Father    Hyperlipidemia Father    Dementia Father    Cancer Sister 28       breast cancer   Diabetes Sister    Diabetes type II Maternal Grandfather    Diabetes Maternal Grandfather    Hyperlipidemia Other    Hypertension Other     Social History:  Social History   Tobacco Use   Smoking status: Former   Smokeless tobacco: Never   Tobacco comments:    5-10 pack year history  Vaping Use   Vaping status: Never Used  Substance Use Topics   Alcohol use: Yes    Alcohol/week: 10.0 standard drinks of alcohol    Types: 10 Glasses of wine per week    Comment: 1-2 drinks daily (wine)   Drug use: No    ROS: Constitutional:  Negative for fever, chills, weight loss CV: Negative for chest pain, previous MI, hypertension Respiratory:  Negative for shortness of breath, wheezing, sleep apnea, frequent cough GI:  Negative for  nausea, vomiting, bloody stool, GERD  Physical exam: BP 134/76   Pulse 75   Ht 6' (1.829 m)   Wt 210 lb (95.3 kg)   BMI 28.48 kg/m  GENERAL APPEARANCE:  Well appearing, well developed, well nourished, NAD HEENT:  Atraumatic, normocephalic, oropharynx clear NECK:  Supple without lymphadenopathy or thyromegaly ABDOMEN:  Soft, non-tender, no masses EXTREMITIES:  Moves all extremities well, without clubbing, cyanosis, or edema NEUROLOGIC:  Alert and oriented x 3, normal gait, CN II-XII grossly intact MENTAL STATUS:  appropriate BACK:  Non-tender to palpation, No CVAT SKIN:  Warm, dry, and intact  Results: None

## 2023-07-23 NOTE — Progress Notes (Signed)
Firmagon Sub Q Injection  Due to Prostate Cancer patient is present today for a Firmagon Injection.   Medication: William Phillips (Degarelix)  Dose: 240mg  Location: right and left upper abdomen Lot: [id number] Exp: 04/2025  Patient tolerated well, no complications were noted  Performed by: Arville Go CMA

## 2023-07-29 ENCOUNTER — Inpatient Hospital Stay: Payer: Medicare Other | Attending: Hematology & Oncology

## 2023-07-29 ENCOUNTER — Ambulatory Visit: Payer: Medicare Other | Admitting: Cardiology

## 2023-07-29 ENCOUNTER — Inpatient Hospital Stay: Payer: Medicare Other | Admitting: Hematology & Oncology

## 2023-07-29 ENCOUNTER — Encounter: Payer: Self-pay | Admitting: Hematology & Oncology

## 2023-07-29 VITALS — BP 146/73 | HR 65 | Temp 97.6°F | Resp 20 | Ht 72.0 in | Wt 212.1 lb

## 2023-07-29 DIAGNOSIS — Z87891 Personal history of nicotine dependence: Secondary | ICD-10-CM | POA: Insufficient documentation

## 2023-07-29 DIAGNOSIS — Z803 Family history of malignant neoplasm of breast: Secondary | ICD-10-CM | POA: Diagnosis not present

## 2023-07-29 DIAGNOSIS — C61 Malignant neoplasm of prostate: Secondary | ICD-10-CM | POA: Insufficient documentation

## 2023-07-29 DIAGNOSIS — C7951 Secondary malignant neoplasm of bone: Secondary | ICD-10-CM | POA: Diagnosis not present

## 2023-07-29 LAB — CMP (CANCER CENTER ONLY)
ALT: 20 U/L (ref 0–44)
AST: 22 U/L (ref 15–41)
Albumin: 4.2 g/dL (ref 3.5–5.0)
Alkaline Phosphatase: 56 U/L (ref 38–126)
Anion gap: 10 (ref 5–15)
BUN: 15 mg/dL (ref 8–23)
CO2: 29 mmol/L (ref 22–32)
Calcium: 10.2 mg/dL (ref 8.9–10.3)
Chloride: 102 mmol/L (ref 98–111)
Creatinine: 0.94 mg/dL (ref 0.61–1.24)
GFR, Estimated: >60 mL/min (ref >60)
Glucose, Bld: 146 mg/dL — ABNORMAL HIGH (ref 70–99)
Potassium: 5 mmol/L (ref 3.5–5.1)
Sodium: 141 mmol/L (ref 135–145)
Total Bilirubin: 0.5 mg/dL (ref 0.3–1.2)
Total Protein: 7.3 g/dL (ref 6.5–8.1)

## 2023-07-29 LAB — CBC WITH DIFFERENTIAL (CANCER CENTER ONLY)
Abs Immature Granulocytes: 0.03 10*3/uL (ref 0.00–0.07)
Basophils Absolute: 0 10*3/uL (ref 0.0–0.1)
Basophils Relative: 0 %
Eosinophils Absolute: 0.3 10*3/uL (ref 0.0–0.5)
Eosinophils Relative: 3 %
HCT: 44.1 % (ref 39.0–52.0)
Hemoglobin: 14.8 g/dL (ref 13.0–17.0)
Immature Granulocytes: 0 %
Lymphocytes Relative: 22 %
Lymphs Abs: 2 10*3/uL (ref 0.7–4.0)
MCH: 32.1 pg (ref 26.0–34.0)
MCHC: 33.6 g/dL (ref 30.0–36.0)
MCV: 95.7 fL (ref 80.0–100.0)
Monocytes Absolute: 0.9 10*3/uL (ref 0.1–1.0)
Monocytes Relative: 10 %
Neutro Abs: 5.9 10*3/uL (ref 1.7–7.7)
Neutrophils Relative %: 65 %
Platelet Count: 240 10*3/uL (ref 150–400)
RBC: 4.61 MIL/uL (ref 4.22–5.81)
RDW: 11.9 % (ref 11.5–15.5)
WBC Count: 9.2 10*3/uL (ref 4.0–10.5)
nRBC: 0 % (ref 0.0–0.2)

## 2023-07-29 MED ORDER — ENZALUTAMIDE 80 MG PO TABS: 160.0000 mg | ORAL_TABLET | Freq: Every day | ORAL | 0 refills | Status: DC

## 2023-07-29 MED ORDER — RELUGOLIX 120 MG PO TABS
ORAL_TABLET | ORAL | 0 refills | Status: DC
  Filled 2023-08-05: qty 30, fill #0

## 2023-07-29 NOTE — Progress Notes (Signed)
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  : My prostate cancer spread to my bones.  HPI: [name] is a very nice 72 year old white male.  He has been quite healthy.  He is originally from [place].  We talked quite a bit.  He comes in with his wife.  We actually Take Care of their daughter, who is actually adopted.  He was found to have a slight elevation of his PSA.  He may have had some nocturia.  Outside of that, he had no other symptoms.  There is no bony pain.  He had no weight loss or weight gain.  He had no swollen lymph nodes.  He had no hematuria.  His PSA I think was 5.4.  He subsequently had a MRI of the prostate.  This note on 05/27/2023.  This did show 2 lesions in the peripheral zone.  He had mild prostatomegaly.  He was then referred to Dr. Pete Glatter of Urology.  He underwent prostate biopsies.  This was done on 06/23/2023.  The pathology report ([id number]) showed 7/12 biopsies that were positive.  The Gleason score was either 6 or 7.  He did undergo a PSMA PET scan.  This was done on 06/23/2023.  This showed that there was activity in the bones.  He had multifocal uptake.  He had left iliac wing, right iliac wing, and L1 vertebral body.  He is already got a dose of degarelix.  He is coming referred to the [facility] for further evaluation.  He feels well.  He denies any, hot flashes.  He has had no diarrhea.  He has had no rashes.  He said no bleeding.  There is no history of prostate cancer in the family.  He smoked but stopped probably about 35 years ago.  I think he may have had COVID a couple years ago.  Currently, I would say performance status is ECOG 0.      Past Medical History:  Diagnosis Date   Allergic rhinitis    Ankle fracture 1998   Atrial flutter (HCC)    a. s/p CTI ablation by Dr  Johney Frame 11/2014   Back pain 08/05/2015   BPH (benign prostatic hyperplasia) 08/26/2015   Ganglion cyst of dorsum of right wrist 04/08/2017   GERD (gastroesophageal reflux disease)    Hammer toe of right foot 05/06/2013   History of carpal tunnel syndrome    Hyperlipidemia    Hypertension    Insect bite 04/23/2015   OA (osteoarthritis) 02/06/2013   knees   Pedal edema 04/08/2017   Preventative health care 04/27/2011   Had normal colonoscopy in 2011   :   Past Surgical History:  Procedure Laterality Date   A-FLUTTER ABLATION N/A 09/12/2021   Procedure: A-FLUTTER ABLATION;  Surgeon: Hillis Range, MD;  [facility];  Service: Cardiovascular;  Laterality: N/A;   ABLATION  11-24-14   CTI ablation by Dr Johney Frame   ANKLE SURGERY     ATRIAL FIBRILLATION ABLATION N/A 09/02/2022   Procedure: ATRIAL FIBRILLATION ABLATION;  Surgeon: Maurice Small, MD;  [facility];  Service: Cardiovascular;  Laterality: N/A;   ATRIAL FLUTTER ABLATION N/A 11/24/2014   Procedure: ATRIAL FLUTTER ABLATION;  Surgeon: Hillis Range, MD;  Location: [facility] CATH LAB;  Service: Cardiovascular;  Laterality: N/A;   CARDIOVERSION N/A 07/14/2022  Procedure: CARDIOVERSION;  Surgeon: Chrystie Nose, MD;  Location: [facility] ENDOSCOPY;  Service: Cardiovascular;  Laterality: N/A;   Carpel tunnel     COLONOSCOPY  08/04/2002   Normal Exam- Dr Shelbie Proctor HERNIA REPAIR  2008   TONSILLECTOMY    :   Current Outpatient Medications:    amLODipine (NORVASC) 10 MG tablet, TAKE ONE-HALF TABLET BY MOUTH  TWICE DAILY, Disp: 100 tablet, Rfl: 2   apixaban (ELIQUIS) 5 MG TABS tablet, TAKE 1 TABLET BY MOUTH TWICE  DAILY, Disp: 200 tablet, Rfl: 1   Ascorbic Acid (VITAMIN C PO), Take 1 tablet by mouth daily., Disp: , Rfl:    Brimonidine Tartrate (LUMIFY) 0.025 % SOLN, Place 1 drop into both eyes every morning., Disp: , Rfl:    Calcium Carbonate-Vit D-Min (CALCIUM 1200 PO), Take 1,200 mg by mouth daily., Disp: ,  Rfl:    Carboxymethylcellulose Sodium (LUBRICANT EYE DROPS OP), Place 1 drop into both eyes 2 (two) times daily as needed (Dry eye)., Disp: , Rfl:    Clobetasol Propionate 0.05 % shampoo, Apply 1 application topically daily as needed (irritation)., Disp: , Rfl:    clotrimazole (CLOTRIMAZOLE ANTI-FUNGAL) 1 % cream, Apply 1 Application topically 2 (two) times daily., Disp: 30 g, Rfl: 0   fexofenadine (ALLEGRA) 180 MG tablet, Take 1 tablet (180 mg total) by mouth daily., Disp: , Rfl:    fluticasone (FLONASE) 50 MCG/ACT nasal spray, Place 2 sprays into both nostrils daily as needed for allergies or rhinitis., Disp: , Rfl: 2   ketoconazole (NIZORAL) 2 % shampoo, Apply 1 application topically daily as needed for irritation., Disp: , Rfl:    lisinopril-hydrochlorothiazide (ZESTORETIC) 20-12.5 MG tablet, TAKE 1 TABLET BY MOUTH DAILY, Disp: 100 tablet, Rfl: 2   metoprolol succinate (TOPROL-XL) 50 MG 24 hr tablet, TAKE 1 TABLET BY MOUTH TWICE  DAILY WITH OR IMMEDIATELY  FOLLOWING A MEAL, Disp: 200 tablet, Rfl: 2   Multiple Vitamin (MULTIVITAMIN) tablet, Take 1 tablet by mouth every evening., Disp: , Rfl:    omeprazole (PRILOSEC) 20 MG capsule, TAKE 1 CAPSULE BY MOUTH DAILY, Disp: 100 capsule, Rfl: 2   oxyCODONE-acetaminophen (PERCOCET) 5-325 MG tablet, Take 1 tablet by mouth every 6 (six) hours as needed., Disp: 10 tablet, Rfl: 0   rosuvastatin (CRESTOR) 40 MG tablet, TAKE 1 TABLET BY MOUTH DAILY, Disp: 100 tablet, Rfl: 1   sildenafil (REVATIO) 20 MG tablet, TAKE 1 TO 4 TABLETS BY MOUTH DAILY AS NEEDED, Disp: 90 tablet, Rfl: 0   silodosin (RAPAFLO) 8 MG CAPS capsule, Take 1 capsule (8 mg total) by mouth daily with breakfast., Disp: 90 capsule, Rfl: 3   VITAMIN D PO, Take 1 capsule by mouth daily. 1000 units daily., Disp: , Rfl: :  :   Allergies  Allergen Reactions   Penicillins Swelling    irriation  :   Family History  Problem Relation Age of Onset   Arthritis Mother    Stroke Mother     Coronary artery disease Father        aortic valve disease   Heart attack Father 47   COPD Father    Hypertension Father    Hyperlipidemia Father    Dementia Father    Cancer Sister 37       breast cancer   Diabetes Sister    Diabetes type II Maternal Grandfather    Diabetes Maternal Grandfather    Hyperlipidemia Other    Hypertension Other   :   Social History  Socioeconomic History   Marital status: Married    Spouse name: Not on file   Number of children: 1   Years of education: Not on file   Highest education level: Not on file  Occupational History    Comment: Sales  Tobacco Use   Smoking status: Former    Current packs/day: 0.00    Average packs/day: 1 pack/day for 10.0 years (10.0 ttl pk-yrs)    Types: Cigarettes    Start date: 3    Quit date: 1989    Years since quitting: 35.8   Smokeless tobacco: Never   Tobacco comments:    5-10 pack year history  Vaping Use   Vaping status: Never Used  Substance and Sexual Activity   Alcohol use: Yes    Alcohol/week: 10.0 standard drinks of alcohol    Types: 10 Glasses of wine per week    Comment: 1-2 drinks daily (wine)   Drug use: No   Sexual activity: Yes  Other Topics Concern   Not on file  Social History Narrative   Occupation: Airline pilot  Architect- chemicals, seed, erosion control)   Married- 35 years   21 daughter -Holiday representative in   ([place]   Previous smoker - 5-10 pack yrs     Alcohol use-yes (2-3 glasses of wine)     Social Determinants of Health   Financial Resource Strain: Low Risk  (10/01/2021)   Overall Financial Resource Strain (CARDIA)    Difficulty of Paying Living Expenses: Not hard at all  Food Insecurity: No Food Insecurity (10/01/2021)   Hunger Vital Sign    Worried About Running Out of Food in the Last Year: Never true    Ran Out of Food in the Last Year: Never true  Transportation Needs: No Transportation Needs (10/01/2021)   PRAPARE - Scientist, research (physical sciences) (Medical): No    Lack of Transportation (Non-Medical): No  Physical Activity: Sufficiently Active (07/29/2023)   Exercise Vital Sign    Days of Exercise per Week: 5 days    Minutes of Exercise per Session: 50 min  Stress: No Stress Concern Present (10/01/2021)   [employer] of Occupational Health - Occupational Stress Questionnaire    Feeling of Stress : Not at all  Social Connections: Socially Integrated (10/01/2021)   Social Connection and Isolation Panel [NHANES]    Frequency of Communication with Friends and Family: More than three times a week    Frequency of Social Gatherings with Friends and Family: More than three times a week    Attends Religious Services: More than 4 times per year    Active Member of [employer] or Organizations: Yes    Attends Banker Meetings: More than 4 times per year    Marital Status: Married  Catering manager Violence: Not At Risk (10/01/2021)   Humiliation, Afraid, Rape, and Kick questionnaire    Fear of Current or Ex-Partner: No    Emotionally Abused: No    Physically Abused: No    Sexually Abused: No  : Review of Systems  Constitutional: Negative.   HENT: Negative.    Eyes: Negative.   Respiratory: Negative.    Cardiovascular: Negative.   Gastrointestinal: Negative.   Genitourinary: Negative.   Musculoskeletal: Negative.   Skin: Negative.   Neurological: Negative.   Endo/Heme/Allergies: Negative.   Psychiatric/Behavioral: Negative.       Exam: Vital signs are temperature of 97.6.  Pulse 65.  Blood pressure 135/77.  Weight is 212 pounds.  NFAOZHYQ@  Physical Exam Vitals reviewed.  HENT:     Head: Normocephalic and atraumatic.  Eyes:     Pupils: Pupils are equal, round, and reactive to light.  Cardiovascular:     Rate and Rhythm: Normal rate and regular rhythm.     Heart sounds: Normal heart sounds.  Pulmonary:     Effort: Pulmonary effort is normal.     Breath sounds: Normal breath sounds.   Abdominal:     General: Bowel sounds are normal.     Palpations: Abdomen is soft.  Musculoskeletal:        General: No tenderness or deformity. Normal range of motion.     Cervical back: Normal range of motion.  Lymphadenopathy:     Cervical: No cervical adenopathy.  Skin:    General: Skin is warm and dry.     Findings: No erythema or rash.  Neurological:     Mental Status: He is alert and oriented to person, place, and time.  Psychiatric:        Behavior: Behavior normal.        Thought Content: Thought content normal.        Judgment: Judgment normal.     Recent Labs    07/29/23 1110  WBC 9.2  HGB 14.8  HCT 44.1  PLT 240    Recent Labs    07/29/23 1110  NA 141  K 5.0  CL 102  CO2 29  GLUCOSE 146*  BUN 15  CREATININE 0.94  CALCIUM 10.2    Blood smear review: None  Pathology: See above    Assessment and Plan: William Phillips is a very nice 72 year old white male.  He has metastatic prostate cancer.  He has but I will consider low-volume disease.  A little surprised that the PSA is not higher concerned that he does not have a high Gleason score.  We will see what the PSA level is today.  Think that he would do okay with antiandrogen therapy.  He is got a dose of degarelix.  I talked him about the new oral therapy for prostate cancer- relugolix (Orgovyx).  He would be interested in taking a pill instead getting injections.  Health he also needs to have an antiandrogen receptor drug.  I probably would use Xtandi.  He will also need to have Xgeva.  I think that we can certainly get his PSA down to normal.  Again, a little bit surprised that the PSA was not higher.  I spent a good hour or so with he and his wife.  I answered all their questions.  We will see about getting the Orgovyx started.  I will send in Xtandi to the pharmacy.  We will try to get him set up with the Xgeva in a week or so.  I really do not see that we need radiotherapy right now.  I  also do not think we need to have chemotherapy with Taxotere.  I just do not think he has high enough for him disease that he would really benefit from Taxotere and that there would be significant side effects.  I feel very confident that we can get him into remission.  Try to get his PSMA PET scan normalized.  I will plan to get him back to see me in about 3-4 weeks.

## 2023-07-30 ENCOUNTER — Encounter: Payer: Self-pay | Admitting: *Deleted

## 2023-07-30 ENCOUNTER — Telehealth: Payer: Self-pay

## 2023-07-30 ENCOUNTER — Telehealth: Payer: Self-pay | Admitting: Pharmacist

## 2023-07-30 ENCOUNTER — Encounter: Payer: Self-pay | Admitting: Hematology & Oncology

## 2023-07-30 ENCOUNTER — Inpatient Hospital Stay: Payer: Medicare Other

## 2023-07-30 ENCOUNTER — Other Ambulatory Visit (HOSPITAL_COMMUNITY): Payer: Self-pay

## 2023-07-30 DIAGNOSIS — C61 Malignant neoplasm of prostate: Secondary | ICD-10-CM

## 2023-07-30 LAB — TESTOSTERONE: Testosterone: 19 ng/dL — ABNORMAL LOW (ref 264–916)

## 2023-07-30 MED ORDER — NUBEQA 300 MG PO TABS
600.0000 mg | ORAL_TABLET | Freq: Two times a day (BID) | ORAL | 0 refills | Status: DC
  Filled 2023-08-05: qty 120, 30d supply, fill #0

## 2023-07-30 NOTE — Telephone Encounter (Signed)
Oral Oncology Patient Advocate Encounter  Reached out and spoke with patient regarding PAP paperwork, explained that I would send it to their preferred email via DocuSign.   Confirmed email address: [email or web address].    Patient expressed understanding and consent.  Will follow up once paperwork has been signed and returned.   Ardeen Fillers, CPhT Oncology Pharmacy Patient Advocate  [facility]  [phone] (phone) [phone] (fax) 07/30/2023 2:34 PM

## 2023-07-30 NOTE — Telephone Encounter (Signed)
Oral Oncology Patient Advocate Encounter   Began application for assistance for Nubeqa through [employer] [place] [employer].   Application will be submitted upon completion of necessary supporting documentation.   [employer]'s phone number [phone].   I will continue to check the status until final determination.    Ardeen Fillers, CPhT Oncology Pharmacy Patient Advocate  [facility]  [phone] (phone) [phone] (fax) 07/30/2023 12:58 PM

## 2023-07-30 NOTE — Telephone Encounter (Signed)
Oral Oncology Patient Advocate Encounter  Received notification that the request for prior authorization for Nubeqa has been denied due to not being prescribed in conjunction with Docetaxel.      Ardeen Fillers, CPhT Oncology Pharmacy Patient Advocate  [facility]  [phone] (phone) [phone] (fax) 07/30/2023 10:22 AM

## 2023-07-30 NOTE — Telephone Encounter (Signed)
Oral Oncology Patient Advocate Encounter   Began application for assistance for Orgovyx through [facility].   Application will be submitted upon completion of necessary supporting documentation.   Orgovyx's phone number [phone].   I will continue to check the status until final determination.    Ardeen Fillers, CPhT Oncology Pharmacy Patient Advocate  [facility]  [phone] (phone) [phone] (fax) 07/30/2023 12:55 PM

## 2023-07-30 NOTE — Progress Notes (Signed)
Patient seen for new diagnosis of met prostate cancer. He prefers oral treatment at this time. Dr Myna Hidalgo will start him on Orgovyx, Chad. Prescription sent; they are working through [place] and patient assistance.   Referrals to nutrition and social work placed per policy.   Oncology Nurse Navigator Documentation     07/30/2023    8:45 AM  Oncology Nurse Navigator Flowsheets  Diagnosis Status Confirmed Diagnosis Complete  Planned Course of Treatment Chemotherapy;Hormonal Therapy  Phase of Treatment Chemo  Chemotherapy Pending- Reason: Authorization  Navigator Follow Up Date: 08/24/2023  Navigator Follow Up Reason: Follow-up Appointment  Navigator Location [facility]  Navigator Encounter Type Appt/Treatment Plan Review  Patient Visit Type MedOnc  Treatment Phase Pre-Tx/Tx Discussion  Barriers/Navigation Needs Coordination of Care;Education  Interventions Referrals  Referrals Nutrition/dietician;Social Work  Support Groups/Services Friends and Family  Time Spent with Patient 15

## 2023-07-30 NOTE — Telephone Encounter (Signed)
Oral Oncology Patient Advocate Encounter   **PAP initiated while waiting on appeal**  An urgent appeal for the prior authorization denial of Nubeqa has been started by the pharmacist on 07/30/23.   This encounter will continue to be updated until final appeal determination.     Ardeen Fillers, CPhT Oncology Pharmacy Patient Advocate  [facility]  [phone] (phone) [phone] (fax) 07/30/2023 1:00 PM

## 2023-07-30 NOTE — Telephone Encounter (Signed)
Oral Oncology Patient Advocate Encounter  Prior Authorization for Diana Eves has been approved.    PA# [id number]  Effective dates: 07/30/23 through 10/12/24  Patients co-pay is $799.42.    Ardeen Fillers, CPhT Oncology Pharmacy Patient Advocate  [facility]  [phone] (phone) [phone] (fax) 07/30/2023 8:46 AM

## 2023-07-30 NOTE — Telephone Encounter (Signed)
Oral Oncology Patient Advocate Encounter  Reached out and spoke with patient regarding PAP paperwork, explained that I would send it to their preferred email via DocuSign.   Confirmed email address: [email or web address].    Patient expressed understanding and consent.  Will follow up once paperwork has been signed and returned.   Ardeen Fillers, CPhT Oncology Pharmacy Patient Advocate  [facility]  [phone] (phone) [phone] (fax) 07/30/2023 2:35 PM

## 2023-07-30 NOTE — Progress Notes (Signed)
HPI: FU Atrial fibrillation/Atrial flutter. Abdominal ultrasound April 2019 showed no aneurysm.  Previously diagnosed with atrial flutter in December 2015.  He subsequently had atrial flutter ablation February 2016.  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  Echocardiogram repeated September 2022 and showed normal LV function, grade 1 diastolic dysfunction.  He had atrial flutter ablation December 2022.  CT prior to atrial fibrillation ablation November 2023 showed no left atrial appendage thrombus and calcium score 462 which was 71st percentile.  Patient had ablation September 02, 2022.  Since last seen she denies dyspnea, chest pain, palpitations or syncope.  No bleeding.  Current Outpatient Medications  Medication Sig Dispense Refill   amLODipine (NORVASC) 10 MG tablet TAKE ONE-HALF TABLET BY MOUTH  TWICE DAILY 100 tablet 2   apixaban (ELIQUIS) 5 MG TABS tablet TAKE 1 TABLET BY MOUTH TWICE  DAILY 200 tablet 1   Ascorbic Acid (VITAMIN C PO) Take 1 tablet by mouth daily.     Brimonidine Tartrate (LUMIFY) 0.025 % SOLN Place 1 drop into both eyes every morning.     Calcium Carbonate-Vit D-Min (CALCIUM 1200 PO) Take 1,200 mg by mouth daily.     Carboxymethylcellulose Sodium (LUBRICANT EYE DROPS OP) Place 1 drop into both eyes 2 (two) times daily as needed (Dry eye).     Clobetasol Propionate 0.05 % shampoo Apply 1 application topically daily as needed (irritation).     clotrimazole (CLOTRIMAZOLE ANTI-FUNGAL) 1 % cream Apply 1 Application topically 2 (two) times daily. 30 g 0   darolutamide (NUBEQA) 300 MG tablet Take 2 tablets (600 mg total) by mouth 2 (two) times daily with a meal. 120 tablet 0   fexofenadine (ALLEGRA) 180 MG tablet Take 1 tablet (180 mg total) by mouth daily.     fluticasone (FLONASE) 50 MCG/ACT nasal spray Place 2 sprays into both nostrils daily as needed for allergies or rhinitis.  2   ketoconazole (NIZORAL) 2 % shampoo Apply 1 application  topically daily as needed for irritation.     lisinopril-hydrochlorothiazide (ZESTORETIC) 20-12.5 MG tablet Take 1 tablet by mouth daily. 90 tablet 1   metoprolol succinate (TOPROL-XL) 50 MG 24 hr tablet TAKE 1 TABLET BY MOUTH TWICE  DAILY WITH OR IMMEDIATELY  FOLLOWING A MEAL 200 tablet 2   Multiple Vitamin (MULTIVITAMIN) tablet Take 1 tablet by mouth every evening.     omeprazole (PRILOSEC) 20 MG capsule TAKE 1 CAPSULE BY MOUTH DAILY 100 capsule 2   oxyCODONE-acetaminophen (PERCOCET) 5-325 MG tablet Take 1 tablet by mouth every 6 (six) hours as needed. 10 tablet 0   rosuvastatin (CRESTOR) 5 MG tablet Take 1 tablet (5 mg total) by mouth daily. 30 tablet 5   sildenafil (REVATIO) 20 MG tablet TAKE 1 TO 4 TABLETS BY MOUTH DAILY AS NEEDED 90 tablet 0   silodosin (RAPAFLO) 8 MG CAPS capsule Take 1 capsule (8 mg total) by mouth daily with breakfast. 90 capsule 3   VITAMIN D PO Take 1 capsule by mouth daily. 1000 units daily.     No current facility-administered medications for this visit.     Past Medical History:  Diagnosis Date   Allergic rhinitis    Ankle fracture 1998   Atrial flutter (HCC)    a. s/p CTI ablation by Dr Johney Frame 11/2014   Back pain 08/05/2015   BPH (benign prostatic hyperplasia) 08/26/2015   Ganglion cyst of dorsum of right wrist 04/08/2017   GERD (gastroesophageal reflux disease)    Lewie Chamber  toe of right foot 05/06/2013   History of carpal tunnel syndrome    Hyperlipidemia    Hypertension    Insect bite 04/23/2015   OA (osteoarthritis) 02/06/2013   knees   Pedal edema 04/08/2017   Preventative health care 04/27/2011   Had normal colonoscopy in 2011     Past Surgical History:  Procedure Laterality Date   A-FLUTTER ABLATION N/A 09/12/2021   Procedure: A-FLUTTER ABLATION;  Surgeon: Hillis Range, MD;  Location: [facility] INVASIVE CV LAB;  Service: Cardiovascular;  Laterality: N/A;   ABLATION  11-24-14   CTI ablation by Dr Johney Frame   ANKLE SURGERY     ATRIAL FIBRILLATION  ABLATION N/A 09/02/2022   Procedure: ATRIAL FIBRILLATION ABLATION;  Surgeon: Maurice Small, MD;  Location: [facility] INVASIVE CV LAB;  Service: Cardiovascular;  Laterality: N/A;   ATRIAL FLUTTER ABLATION N/A 11/24/2014   Procedure: ATRIAL FLUTTER ABLATION;  Surgeon: Hillis Range, MD;  Location: [facility] CATH LAB;  Service: Cardiovascular;  Laterality: N/A;   CARDIOVERSION N/A 07/14/2022   Procedure: CARDIOVERSION;  Surgeon: Chrystie Nose, MD;  Location: [facility] ENDOSCOPY;  Service: Cardiovascular;  Laterality: N/A;   Carpel tunnel     COLONOSCOPY  08/04/2002   Normal Exam- Dr Shelbie Proctor HERNIA REPAIR  2008   TONSILLECTOMY      Social History   Socioeconomic History   Marital status: Married    Spouse name: Not on file   Number of children: 1   Years of education: Not on file   Highest education level: Not on file  Occupational History    Comment: Sales  Tobacco Use   Smoking status: Former    Current packs/day: 0.00    Average packs/day: 1 pack/day for 10.0 years (10.0 ttl pk-yrs)    Types: Cigarettes    Start date: 70    Quit date: 1989    Years since quitting: 35.8   Smokeless tobacco: Never   Tobacco comments:    5-10 pack year history  Vaping Use   Vaping status: Never Used  Substance and Sexual Activity   Alcohol use: Yes    Alcohol/week: 10.0 standard drinks of alcohol    Types: 10 Glasses of wine per week    Comment: 1-2 drinks daily (wine)   Drug use: No   Sexual activity: Yes  Other Topics Concern   Not on file  Social History Narrative   Occupation: Airline pilot  Architect- chemicals, seed, erosion control)   Married- 35 years   21 daughter -Holiday representative in   ([place]   Previous smoker - 5-10 pack yrs     Alcohol use-yes (2-3 glasses of wine)     Social Determinants of Health   Financial Resource Strain: Low Risk  (10/01/2021)   Overall Financial Resource Strain (CARDIA)    Difficulty of Paying Living Expenses: Not hard at all  Food Insecurity: No  Food Insecurity (10/01/2021)   Hunger Vital Sign    Worried About Running Out of Food in the Last Year: Never true    Ran Out of Food in the Last Year: Never true  Transportation Needs: No Transportation Needs (10/01/2021)   PRAPARE - Administrator, Civil Service (Medical): No    Lack of Transportation (Non-Medical): No  Physical Activity: Sufficiently Active (07/29/2023)   Exercise Vital Sign    Days of Exercise per Week: 5 days    Minutes of Exercise per Session: 50 min  Stress: No Stress Concern Present (10/01/2021)  [employer] of Occupational Health - Occupational Stress Questionnaire    Feeling of Stress : Not at all  Social Connections: Socially Integrated (10/01/2021)   Social Connection and Isolation Panel [NHANES]    Frequency of Communication with Friends and Family: More than three times a week    Frequency of Social Gatherings with Friends and Family: More than three times a week    Attends Religious Services: More than 4 times per year    Active Member of [employer] or Organizations: Yes    Attends Engineer, structural: More than 4 times per year    Marital Status: Married  Catering manager Violence: Not At Risk (10/01/2021)   Humiliation, Afraid, Rape, and Kick questionnaire    Fear of Current or Ex-Partner: No    Emotionally Abused: No    Physically Abused: No    Sexually Abused: No    Family History  Problem Relation Age of Onset   Arthritis Mother    Stroke Mother    Coronary artery disease Father        aortic valve disease   Heart attack Father 64   COPD Father    Hypertension Father    Hyperlipidemia Father    Dementia Father    Cancer Sister 50       breast cancer   Diabetes Sister    Diabetes type II Maternal Grandfather    Diabetes Maternal Grandfather    Hyperlipidemia Other    Hypertension Other     ROS: no fevers or chills, productive cough, hemoptysis, dysphasia, odynophagia, melena, hematochezia, dysuria,  hematuria, rash, seizure activity, orthopnea, PND, pedal edema, claudication. Remaining systems are negative.  Physical Exam: Well-developed well-nourished in no acute distress.  Skin is warm and dry.  HEENT is normal.  Neck is supple.  Chest is clear to auscultation with normal expansion.  Cardiovascular exam is regular rate and rhythm.  Abdominal exam nontender or distended. No masses palpated. Extremities show no edema. neuro grossly intact  EKG Interpretation Date/Time:  Wednesday August 12 2023 09:29:49 EDT Ventricular Rate:  74 PR Interval:  150 QRS Duration:  84 QT Interval:  400 QTC Calculation: 444 R Axis:   58  Text Interpretation: NSR with pacs When compared with ECG of 03-Oct-2022 14:17, Current undetermined rhythm precludes rhythm comparison, needs review Confirmed by Olga Millers ([id number]) on 08/12/2023 9:30:49 AM    A/P  1 paroxysmal atrial fibrillation-patient remains in sinus rhythm status post ablation.  Continue Toprol and apixaban.  2 hypertension-blood pressure controlled.  Continue present medications.  3 hyperlipidemia-continue Crestor.  4 coronary calcification-patient is not having chest pain.  Continue statin.  5 history of atrial flutter ablation  Olga Millers, MD

## 2023-07-30 NOTE — Telephone Encounter (Signed)
Oral Oncology Pharmacist Encounter   Prior Authorization for Merleen Nicely has been denied.     Urgent/Expedited appeal letter sent to [employer] with supporting documentation. Appeal faxed to: [phone]    Oral Oncology Clinic will continue to follow.    Lenord Carbo, PharmD, BCPS, [facility] Hematology/Oncology Clinical Pharmacist [facility] and [facility] Oral Chemotherapy Navigation Clinics [phone] 07/30/2023 11:56 AM

## 2023-07-30 NOTE — Telephone Encounter (Signed)
Oral Oncology Patient Advocate Encounter  New authorization   Received notification that prior authorization for Nubeqa is required.   PA submitted on 07/30/23  Key [id number]  Status is pending     Ardeen Fillers, CPhT Oncology Pharmacy Patient Advocate  [facility]  [phone] (phone) [phone] (fax) 07/30/2023 9:43 AM

## 2023-07-30 NOTE — Telephone Encounter (Signed)
Oral Oncology Pharmacist Encounter  Received new prescription for Nubeqa (darolutamide) and Orgovyx (relugolix) for the treatment of metastatic castration sensitive prostate cancer, planned duration until disease progression or unacceptable drug toxicity.  Spoke with Dr. Myna Hidalgo - patient originally prescribed Xtandi, but this has a category D drug-drug interaction with patient's apixaban and can decrease efficacy and serum concentrations of apixaban. Recommendation per PI is to consider alternative therapy to [facility]. Patient will be switched to Nubeqa instead due to less drug-drug interactions.  CBC w/ Diff and CMP from 07/29/23 assessed, no relevant lab abnormalities requiring baseline dose adjustment required at this time. Prescription doses and frequencies assessed for appropriateness.  Current medication list in Epic reviewed, DDIs with Nubeqa identified: Category D drug-drug interaction between [place] and Crestor - Nubeqa may increase serum concentrations of Crestor - max recommended dose per package insert for Crestor is 5 mg when used in combination with Nubeqa. Will reach out to patient's cardiology office to disuss DDI and dose adjustments. Of note, other statins (I.e. atorvastatin does not carry the same risk, but patient would still need to be monitored closely for side effects from statins (I.e. myalgias, LFT changes, etc.)   Evaluated chart and no patient barriers to medication adherence noted.   Prescription has been e-scribed to the [facility] for benefits analysis and approval.  Oral Oncology Clinic will continue to follow for insurance authorization, copayment issues, initial counseling and start date.  William Phillips, PharmD, BCPS, [facility] Hematology/Oncology Clinical Pharmacist [facility] and [facility] Oral Chemotherapy Navigation Clinics [phone] 07/30/2023 9:41 AM

## 2023-07-30 NOTE — Telephone Encounter (Signed)
Oral Oncology Patient Advocate Encounter  New authorization   Received notification that prior authorization for Orgovyx is required.   PA submitted on 07/30/23  Key [id number]  Status is pending     Ardeen Fillers, CPhT Oncology Pharmacy Patient Advocate  [facility]  [phone] (phone) [phone] (fax) 07/30/2023 8:30 AM

## 2023-07-30 NOTE — Telephone Encounter (Signed)
Oral Oncology Patient Advocate Encounter  New authorization   Received notification that prior authorization for Diana Eves is required.   PA submitted on 07/30/23  Key [id number]  Status is pending     Ardeen Fillers, CPhT Oncology Pharmacy Patient Advocate  [facility]  [phone] (phone) [phone] (fax) 07/30/2023 8:24 AM

## 2023-07-30 NOTE — Progress Notes (Signed)
[facility] Clinical Social Work  Initial Assessment   William Phillips is a 72 y.o. year old male contacted by phone. Clinical Social Work was referred by nurse navigator for assessment of psychosocial needs.   SDOH (Social Determinants of Health) assessments performed: Yes SDOH Interventions    Flowsheet Row Office Visit from 07/29/2023 in [facility] at [facility] Clinical Support from 10/10/2022 in [facility] at [facility]  SDOH Interventions    Housing Interventions -- Intervention Not Indicated  Alcohol Usage Interventions -- Intervention Not Indicated (Score <7)  Physical Activity Interventions Intervention Not Indicated --  Health Literacy Interventions Intervention Not Indicated --       SDOH Screenings   Food Insecurity: No Food Insecurity (10/01/2021)  Housing: Low Risk  (10/10/2022)  Transportation Needs: No Transportation Needs (10/01/2021)  Utilities: Not At Risk (10/07/2022)  Alcohol Screen: Low Risk  (10/10/2022)  Depression (PHQ2-9): Low Risk  (06/18/2023)  Financial Resource Strain: Low Risk  (10/01/2021)  Physical Activity: Sufficiently Active (07/29/2023)  Social Connections: Socially Integrated (10/01/2021)  Stress: No Stress Concern Present (10/01/2021)  Tobacco Use: Medium Risk (07/29/2023)  Health Literacy: Adequate Health Literacy (07/29/2023)     Distress Screen completed: No     No data to display            Family/Social Information:  Housing Arrangement: patient lives with his wife. Family members/support persons in your life? Family and Friends Transportation concerns: no  Employment: Working full time at [employer].  Income source: Employment Financial concerns: No Type of concern: None Food access concerns: no Religious or spiritual practice: Yes-Patient identifies as Curator. Services Currently in place:  [employer] Adjustment to diagnosis: Patient  understands treatment plan and what happens next? yes Concerns about diagnosis and/or treatment: I'm not especially worried about anything Patient reported stressors:  None per patient. Hopes and/or priorities: Family Patient enjoys time with family/ friends Current coping skills/ strengths: Average or above average intelligence , Capable of independent living , Communication skills , Financial means , General fund of knowledge , and Supportive family/friends     SUMMARY: Current SDOH Barriers:  None identifies per patient.  Clinical Social Work Clinical Goal(s):  No clinical social work goals at this time  Interventions: Discussed common feeling and emotions when being diagnosed with cancer, and the importance of support during treatment Informed patient of the support team roles and support services at [facility] Provided CSW contact information and encouraged patient to call with any questions or concerns Provided patient with information about the [employer] and [employer].  CSW to mail literature along with contact information.   Follow Up Plan: Patient will contact CSW with any support or resource needs Patient verbalizes understanding of plan: Yes    Dorothey Baseman, LCSW Clinical Social Worker [facility]

## 2023-07-30 NOTE — Telephone Encounter (Addendum)
Oral Oncology Patient Advocate Encounter  Prior Authorization for Orgovyx has been approved.    PA# [id number]  Effective dates: 07/30/23 through 10/12/24  Patients co-pay is $674.02.   PAP initiated. See additional encounter.   Ardeen Fillers, CPhT Oncology Pharmacy Patient Advocate  [facility]  [phone] (phone) [phone] (fax) 07/30/2023 8:47 AM

## 2023-07-31 ENCOUNTER — Other Ambulatory Visit (HOSPITAL_COMMUNITY): Payer: Self-pay

## 2023-07-31 ENCOUNTER — Telehealth: Payer: Self-pay | Admitting: *Deleted

## 2023-07-31 LAB — PSA, TOTAL AND FREE
PSA, Free Pct: 11.3 %
PSA, Free: 0.52 ng/mL
Prostate Specific Ag, Serum: 4.6 ng/mL — ABNORMAL HIGH (ref 0.0–4.0)

## 2023-07-31 NOTE — Telephone Encounter (Signed)
Received notification via fax that application was received and would begin processing. I will continue to follow and update until final determination.    Ardeen Fillers, CPhT Oncology Pharmacy Patient Advocate  [facility]  [phone] (phone) [phone] (fax) 07/31/2023 2:10 PM

## 2023-07-31 NOTE — Telephone Encounter (Signed)
-----   Message from Josph Macho sent at 07/31/2023  6:58 AM EDT ----- Please call and let him know that the PSA is 4.6.  Thanks.  Cindee Lame

## 2023-07-31 NOTE — Telephone Encounter (Signed)
Oral Oncology Patient Advocate Encounter   Submitted application for assistance for Nubeqa to [employer] [place] [employer].   Application submitted via e-fax to [phone]   [employer]'s phone number [phone].   I will continue to check the status until final determination.    Ardeen Fillers, CPhT Oncology Pharmacy Patient Advocate  [facility]  [phone] (phone) [phone] (fax) 07/31/2023 1:28 PM

## 2023-07-31 NOTE — Telephone Encounter (Addendum)
Oral Oncology Patient Advocate Encounter  Prior Authorization for Merleen Nicely has been approved.    PA# [id number]  Effective dates: 07/30/23 through 10/12/24  Patients co-pay is $799.42.   PAP initiated. See additional encounter.   Ardeen Fillers, CPhT Oncology Pharmacy Patient Advocate  [facility]  [phone] (phone) [phone] (fax) 07/31/2023 8:24 AM

## 2023-07-31 NOTE — Telephone Encounter (Signed)
Oral Oncology Pharmacist Encounter  Notified that appeal for Nubeqa (darolutamide) has been overturned and approved.    Patient has high copay, so will proceed with applying for manufacturer assistance at this time.   Oral Oncology Clinic will continue to follow.   Lenord Carbo, PharmD, BCPS, [facility] Hematology/Oncology Clinical Pharmacist [facility] and [facility] Oral Chemotherapy Navigation Clinics [phone] 07/31/2023 8:38 AM

## 2023-07-31 NOTE — Telephone Encounter (Signed)
Oral Oncology Patient Advocate Encounter   Submitted application for assistance for Orgovyx to [employer] Support Program/ Patient Assistance Program.   Application submitted via e-fax to [phone]   Orgovyx PAP's phone number [phone].   I will continue to check the status until final determination.    Ardeen Fillers, CPhT Oncology Pharmacy Patient Advocate  [facility]  [phone] (phone) [phone] (fax) 07/31/2023 1:27 PM

## 2023-08-04 ENCOUNTER — Other Ambulatory Visit (HOSPITAL_COMMUNITY): Payer: Self-pay

## 2023-08-05 ENCOUNTER — Telehealth: Payer: Self-pay | Admitting: Cardiology

## 2023-08-05 ENCOUNTER — Other Ambulatory Visit (HOSPITAL_COMMUNITY): Payer: Self-pay

## 2023-08-05 ENCOUNTER — Other Ambulatory Visit: Payer: Self-pay

## 2023-08-05 DIAGNOSIS — E782 Mixed hyperlipidemia: Secondary | ICD-10-CM

## 2023-08-05 NOTE — Telephone Encounter (Signed)
Oral Oncology Patient Advocate Encounter   Received notification that the application for assistance for Orgovyx through Orgov[employer] Support Program/ Patient Assistance Program has been denied due to patient exceeds income limitations.   [employer]'s phone number [phone].   I have spoken to the patient.   Ardeen Fillers, CPhT Oncology Pharmacy Patient Advocate  [facility]  [phone] (phone) [phone] (fax) 08/05/2023 9:29 AM

## 2023-08-05 NOTE — Telephone Encounter (Signed)
Oral Oncology Patient Advocate Encounter   Received notification that the application for assistance for Nubeqa through [place] Patient [employer] has been approved.   [employer]'s phone number [phone].   Effective dates: 08/05/23 through 10/13/23  Medication will [facility] filled at [facility].  I have spoken to the patient. Patient has scheduled delivery of medication through [employer] for Monday, 08/10/23.   Patient knows to call me at [phone] with any questions or concerns regarding receiving medication from Patient Assistance Program.    Ardeen Fillers, CPhT Oncology Pharmacy Patient Advocate  [facility]  [phone] (phone) [phone] (fax) 08/05/2023 9:31 AM

## 2023-08-05 NOTE — Telephone Encounter (Signed)
Pt c/o medication issue:  1. Name of Medication: rosuvastatin (CRESTOR) 40 MG tablet   2. How are you currently taking this medication (dosage and times per day)?   3. Are you having a reaction (difficulty breathing--STAT)?   4. What is your medication issue?   Caller Lurena Joiner, Pharmacist) stated patient is being put on Nubeqa and this medication will affect the dosage of his Crestor.  Caller wants to decrease patient's Crestor medication or suggest alternate medication adjustment.

## 2023-08-07 ENCOUNTER — Telehealth: Payer: Self-pay | Admitting: Pharmacist

## 2023-08-07 MED ORDER — ROSUVASTATIN CALCIUM 5 MG PO TABS: 5.0000 mg | ORAL_TABLET | Freq: Every day | ORAL | 5 refills | Status: DC

## 2023-08-07 NOTE — Telephone Encounter (Signed)
Pavero, Cristal Deer, RPH20 minutes ago (10:25 AM)    Recommend decreasing dose of Crestor to 20mg  daily     Called Lurena Joiner and told her of the Pharmacists' recommendation. She stated that she would speak with the pharmacist concerning this medication.

## 2023-08-07 NOTE — Telephone Encounter (Signed)
Oral Chemotherapy Pharmacist Encounter   Patient Education I spoke with patient for overview of new oral chemotherapy medication: Nubeqa (darolutamide) for the treatment of metastatic castration sensitive prostate cancer in conjunction with ADT, planned duration for Nubeqa until disease progression or unacceptable drug toxicity.  Of note - MD office is aware that patient cannot afford Orgovyx and does not qualify for patient assistance, thus they will be switching him back to injectable ADT.    Pt is doing well. Counseled patient on administration, dosing, side effects, monitoring, drug-food interactions, safe handling, storage, and disposal.   Patient will take Nubeqa 300 mg tablet, 2 tablets (600 mg total) by mouth 2 (two) times daily with a meal.   Patient knows to avoid grapefruit and grapefruit juice while on Nubeqa.   Start date: 08/10/23 PM dose   Side effects include but not limited to: changes in LFTs, HTN, fatigue.     Reviewed with patient importance of keeping a medication schedule and plan for any missed doses.   After discussion with patient no patient barriers to medication adherence identified.   Patient made aware of drug-drug interaction with his previous dose of Crestor and Nubeqa. Patient's cardiology office has reduced Crestor to 5 mg daily and patient is aware. He will pick this up over the weekend and switch to taking the Crestor 5 mg daily on 08/10/23.  William Phillips voiced understanding and appreciation. All questions answered. Medication handout provided.   Provided patient with Oral Chemotherapy Navigation Clinic phone number. Patient knows to call the office with questions or concerns.  Lenord Carbo, PharmD, BCPS, [facility] Hematology/Oncology Clinical Pharmacist [facility] and [facility] Oral Chemotherapy Navigation Clinics [phone] 08/07/2023 11:31 AM

## 2023-08-07 NOTE — Telephone Encounter (Signed)
Routing to provider  

## 2023-08-07 NOTE — Addendum Note (Signed)
Addended by: Cheree Ditto on: 08/07/2023 10:57 AM   Modules accepted: Orders

## 2023-08-07 NOTE — Telephone Encounter (Addendum)
Consulted with Lenord Carbo PharmD. Will reduce rosuvastatin to 5mg  once daily instead of 20mg .

## 2023-08-07 NOTE — Telephone Encounter (Signed)
Recommend decreasing dose of Crestor to 20mg  daily

## 2023-08-10 ENCOUNTER — Other Ambulatory Visit: Payer: Self-pay | Admitting: Family Medicine

## 2023-08-12 ENCOUNTER — Ambulatory Visit: Payer: Medicare Other | Attending: Cardiology | Admitting: Cardiology

## 2023-08-12 ENCOUNTER — Encounter: Payer: Self-pay | Admitting: Cardiology

## 2023-08-12 ENCOUNTER — Inpatient Hospital Stay: Payer: Medicare Other | Admitting: Dietician

## 2023-08-12 VITALS — BP 100/58 | HR 69 | Ht 72.0 in | Wt 212.0 lb

## 2023-08-12 DIAGNOSIS — I10 Essential (primary) hypertension: Secondary | ICD-10-CM

## 2023-08-12 DIAGNOSIS — I480 Paroxysmal atrial fibrillation: Secondary | ICD-10-CM | POA: Diagnosis not present

## 2023-08-12 DIAGNOSIS — E782 Mixed hyperlipidemia: Secondary | ICD-10-CM | POA: Diagnosis not present

## 2023-08-12 DIAGNOSIS — I2510 Atherosclerotic heart disease of native coronary artery without angina pectoris: Secondary | ICD-10-CM

## 2023-08-12 NOTE — Patient Instructions (Signed)

## 2023-08-12 NOTE — Progress Notes (Signed)
Nutrition Assessment: Reached out to patient at home telephone number, wife with him during the call.    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   He has just started  relugolix treatment this past Monday and is followed by Dr. Myna Hidalgo.  He has a supportive wife who does most of the meal prep.  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  He reports he noticed a change in appetite past couple of days, and has an issue with GERD which he has had controlled in past with daily PPI. Last night didn't have much of an appetite.  Usual PO: cereal morning, skim milk with banana or blueberries (one day eggs and toast or oatmeal with berries or walnuts made with water) soups (vegetable or broth based) or sandwich grilled fish or chicken, eat out once a week usually seafood not a lot of red meat  Fluids: coffee with juice, 2-3 glasses of water a day 2 glasses of wine  Nutrition Focused Physical Exam: unable to perform NFPE   Medications: Takes Balance of Nature, MVI, Ca+, Vit.D   Labs: Reviewed 07/29/23   Anthropometrics: no significant weight changes  Height: 72" Weight: 212# UBW: 200-210 BMI: 28.15   NUTRITION DIAGNOSIS: Food and Nutrition Related Knowledge Deficit related to cancer and associated treatments as evidenced by no prior need for nutrition related information.   INTERVENTION:   Relayed that nutrition services are wrap around service provided at no charge and encouraged continued communication if experiencing any nutritional impact symptoms (NIS). Educated on importance of adequate nourishment with calorie and protein energy intake with nutrient dense foods when possible to maintain weight/strength and QOL.   Reviewed strategies to increase nutrient density if volume of intake decreases. Encouraged whole food Mediterranean  diet.  Emailed Nutrition Tip sheet  for  Nutrition During Cancer Treatment, websites to aid in recipes and resources for wife with contact information provided.  MONITORING, EVALUATION, GOAL: weight trends, nutrition impact symptoms, PO intake, labs   Next Visit: Remote next month after planned trip to [facility] for a conference  Gennaro Africa, RDN, LDN Registered Dietitian, [facility] Part Time Remote (Usual office hours: Tuesday-Thursday) Mobile: [phone]

## 2023-08-17 ENCOUNTER — Encounter: Payer: Self-pay | Admitting: Hematology & Oncology

## 2023-08-17 DIAGNOSIS — L820 Inflamed seborrheic keratosis: Secondary | ICD-10-CM | POA: Diagnosis not present

## 2023-08-17 DIAGNOSIS — Z129 Encounter for screening for malignant neoplasm, site unspecified: Secondary | ICD-10-CM | POA: Diagnosis not present

## 2023-08-17 DIAGNOSIS — D1801 Hemangioma of skin and subcutaneous tissue: Secondary | ICD-10-CM | POA: Diagnosis not present

## 2023-08-17 DIAGNOSIS — L570 Actinic keratosis: Secondary | ICD-10-CM | POA: Diagnosis not present

## 2023-08-17 DIAGNOSIS — Z85828 Personal history of other malignant neoplasm of skin: Secondary | ICD-10-CM | POA: Diagnosis not present

## 2023-08-17 NOTE — Addendum Note (Signed)
Addended by: Arlan Organ R on: 08/17/2023 05:23 PM   Modules accepted: Orders

## 2023-08-20 ENCOUNTER — Ambulatory Visit: Payer: Medicare Other | Admitting: Urology

## 2023-08-20 ENCOUNTER — Encounter: Payer: Self-pay | Admitting: Urology

## 2023-08-20 VITALS — BP 123/79 | HR 72 | Ht 72.0 in | Wt 205.0 lb

## 2023-08-20 DIAGNOSIS — Z87898 Personal history of other specified conditions: Secondary | ICD-10-CM

## 2023-08-20 DIAGNOSIS — R8290 Unspecified abnormal findings in urine: Secondary | ICD-10-CM

## 2023-08-20 DIAGNOSIS — C61 Malignant neoplasm of prostate: Secondary | ICD-10-CM | POA: Diagnosis not present

## 2023-08-20 DIAGNOSIS — N401 Enlarged prostate with lower urinary tract symptoms: Secondary | ICD-10-CM | POA: Diagnosis not present

## 2023-08-20 DIAGNOSIS — C7951 Secondary malignant neoplasm of bone: Secondary | ICD-10-CM | POA: Diagnosis not present

## 2023-08-20 DIAGNOSIS — N138 Other obstructive and reflux uropathy: Secondary | ICD-10-CM

## 2023-08-20 MED ORDER — DEGARELIX ACETATE 80 MG ~~LOC~~ SOLR
80.0000 mg | Freq: Once | SUBCUTANEOUS | Status: AC
Start: 2023-08-20 — End: 2023-08-20
  Administered 2023-08-20: 80 mg via SUBCUTANEOUS

## 2023-08-20 NOTE — Progress Notes (Signed)
Firmagon Sub Q Injection  Due to Prostate Cancer patient is present today for a Firmagon Injection.   Medication: Deborra Medina (Degarelix)  Dose: 80mg  Location: left upper abdomen Lot: [id number] Exp: 09/2024  Patient tolerated well, no complications were noted  Performed by: Arville Go CMA  PA approval dates: 08/19/23 through 08/19/24

## 2023-08-20 NOTE — Progress Notes (Signed)
Assessment: 1. Prostate cancer (HCC); PSA 5.3; GG 1,2; unfavorable intermediate risk   2. 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 History of urinary retention   4. BPH with obstruction/lower urinary tract symptoms     Plan: Continue silodosin Urine culture sent today Continue androgen deprivation therapy today with the degarelix 80 mg  Continue Nubeqa Daily vit D and calcium supplement Return to office in 1 month for next ADT - will transition to Eligard  Chief Complaint:  Chief Complaint  Patient presents with   Prostate Cancer    History of Present Illness:  William Phillips is a 72 y.o. male who is seen for continued evaluation of prostate cancer and urinary retention.  He has a history of elevated PSA and BPH with LUTS. PSA results: 1/17 1.63 2/20 2.22 5/21 2.33 9/23 5.41 3/24 4.18 6/24 5.3  No prior prostate biopsy.  No history of UTIs or prostatitis.  No family history of prostate cancer. MRI of the prostate from 05/28/2023 showed a volume of 54.37 cm and PI-RADS 4 lesions in the right mid gland and apex and left apex.  He underwent fusion guided biopsy on 06/23/2023. Prostate volume measured approximately 86 g. Biopsy results: Gleason 3+4, Gleason 3+3 adenocarcinoma in 3/7 cores on left and 6/7 cores on right.  Both regions of interest were positive.  PSMA PET scan from 07/13/2023 showed avid uptake in the prostate with multifocal osseous metastases involving the posterior left iliac, right iliac, and L1 vertebral body.   He has a history of lower urinary tract symptoms for 4-5 years.  He had been on tamsulosin 0.4 mg for approximately 5 years.  He continued to have symptoms of frequency, voiding every 2 hours, occasional urgency and urge incontinence, and nocturia 2-5 times per night.  He reported voiding with a good stream.   No dysuria or gross hematuria. IPSS = 18. PVR = 181 ml.  He was given a trial of alfuzosin 10 mg daily at his visit in  3/24.  He reported difficulty voiding while on the medication and was changed to silodosin. He continued on silodosin with improvement in his urinary symptoms with the medication.  He noted some improvement in his urinary stream as well as decreased nocturia and frequency.  No dysuria or gross hematuria. IPSS = 16.   He developed urinary retention approximately 4-5 days after the biopsy.  He was seen in the emergency room and a Foley catheter was placed with return of approximately 2000 mL.   His foley was removed on 07/06/23.  He returned to the ER that evening unable to void and his foley was replaced.  He presented for a voiding trial on 07/13/23.  He was able to void only a small amount.  He was also found to have paraphimosis which was reduced.   Due to the significant penile edema, his foley was replaced. His Foley catheter was removed and he was started on intermittent catheterization on 07/16/2023. He was continued on CIC and silodosin. He was started on ADT with firmagon on 07/23/23. He has been seen by Dr. Myna Hidalgo with Oncology and started on Nubeqa.  He returns today for follow-up.  He has tolerated the androgen deprivation therapy well.  No hot flashes.  He started on Nubeqa approximately 2 weeks ago and is tolerating this well.  He is currently voiding spontaneously.  He has not performed intermittent catheterization for approximately 2 weeks.  He does have some frequency and intermittent stream.  No dysuria or gross hematuria. IPSS = 17 today.  Portions of the above documentation were copied from a prior visit for review purposes only.   Past Medical History:  Past Medical History:  Diagnosis Date   Allergic rhinitis    Ankle fracture 1998   Atrial flutter (HCC)    a. s/p CTI ablation by Dr Johney Frame 11/2014   Back pain 08/05/2015   BPH (benign prostatic hyperplasia) 08/26/2015   Ganglion cyst of dorsum of right wrist 04/08/2017   GERD (gastroesophageal reflux disease)    Hammer  toe of right foot 05/06/2013   History of carpal tunnel syndrome    Hyperlipidemia    Hypertension    Insect bite 04/23/2015   OA (osteoarthritis) 02/06/2013   knees   Pedal edema 04/08/2017   Preventative health care 04/27/2011   Had normal colonoscopy in 2011     Past Surgical History:  Past Surgical History:  Procedure Laterality Date   A-FLUTTER ABLATION N/A 09/12/2021   Procedure: A-FLUTTER ABLATION;  Surgeon: Hillis Range, MD;  Location: [facility] INVASIVE CV LAB;  Service: Cardiovascular;  Laterality: N/A;   ABLATION  11-24-14   CTI ablation by Dr Johney Frame   ANKLE SURGERY     ATRIAL FIBRILLATION ABLATION N/A 09/02/2022   Procedure: ATRIAL FIBRILLATION ABLATION;  Surgeon: Maurice Small, MD;  Location: [facility] INVASIVE CV LAB;  Service: Cardiovascular;  Laterality: N/A;   ATRIAL FLUTTER ABLATION N/A 11/24/2014   Procedure: ATRIAL FLUTTER ABLATION;  Surgeon: Hillis Range, MD;  Location: [facility] CATH LAB;  Service: Cardiovascular;  Laterality: N/A;   CARDIOVERSION N/A 07/14/2022   Procedure: CARDIOVERSION;  Surgeon: Chrystie Nose, MD;  Location: [facility] ENDOSCOPY;  Service: Cardiovascular;  Laterality: N/A;   Carpel tunnel     COLONOSCOPY  08/04/2002   Normal Exam- Dr Shelbie Proctor HERNIA REPAIR  2008   TONSILLECTOMY      Allergies:  Allergies  Allergen Reactions   Penicillins Swelling    irriation    Family History:  Family History  Problem Relation Age of Onset   Arthritis Mother    Stroke Mother    Coronary artery disease Father        aortic valve disease   Heart attack Father 20   COPD Father    Hypertension Father    Hyperlipidemia Father    Dementia Father    Cancer Sister 44       breast cancer   Diabetes Sister    Diabetes type II Maternal Grandfather    Diabetes Maternal Grandfather    Hyperlipidemia Other    Hypertension Other     Social History:  Social History   Tobacco Use   Smoking status: Former    Current packs/day: 0.00    Average packs/day: 1  pack/day for 10.0 years (10.0 ttl pk-yrs)    Types: Cigarettes    Start date: 56    Quit date: 1989    Years since quitting: 35.8   Smokeless tobacco: Never   Tobacco comments:    5-10 pack year history  Vaping Use   Vaping status: Never Used  Substance Use Topics   Alcohol use: Yes    Alcohol/week: 10.0 standard drinks of alcohol    Types: 10 Glasses of wine per week    Comment: 1-2 drinks daily (wine)   Drug use: No    ROS: Constitutional:  Negative for fever, chills, weight loss CV: Negative for chest pain, previous MI, hypertension Respiratory:  Negative for shortness  of breath, wheezing, sleep apnea, frequent cough GI:  Negative for nausea, vomiting, bloody stool, GERD  Physical exam: BP 123/79   Pulse 72   Ht 6' (1.829 m)   Wt 205 lb (93 kg)   BMI 27.80 kg/m  GENERAL APPEARANCE:  Well appearing, well developed, well nourished, NAD HEENT:  Atraumatic, normocephalic, oropharynx clear NECK:  Supple without lymphadenopathy or thyromegaly ABDOMEN:  Soft, non-tender, no masses EXTREMITIES:  Moves all extremities well, without clubbing, cyanosis, or edema NEUROLOGIC:  Alert and oriented x 3, normal gait, CN II-XII grossly intact MENTAL STATUS:  appropriate BACK:  Non-tender to palpation, No CVAT SKIN:  Warm, dry, and intact  Results: U/A:  >30 WBC, 0-2 RBC, few bacteria

## 2023-08-21 ENCOUNTER — Encounter: Payer: Self-pay | Admitting: Hematology & Oncology

## 2023-08-24 ENCOUNTER — Inpatient Hospital Stay: Payer: Medicare Other

## 2023-08-24 ENCOUNTER — Other Ambulatory Visit: Payer: Self-pay

## 2023-08-24 ENCOUNTER — Inpatient Hospital Stay: Payer: Medicare Other | Attending: Hematology & Oncology

## 2023-08-24 ENCOUNTER — Other Ambulatory Visit: Payer: Self-pay | Admitting: *Deleted

## 2023-08-24 ENCOUNTER — Inpatient Hospital Stay (HOSPITAL_BASED_OUTPATIENT_CLINIC_OR_DEPARTMENT_OTHER): Payer: Medicare Other | Admitting: Hematology & Oncology

## 2023-08-24 ENCOUNTER — Encounter: Payer: Self-pay | Admitting: Hematology & Oncology

## 2023-08-24 VITALS — BP 119/63 | HR 116 | Temp 98.2°F | Resp 19 | Ht 72.0 in | Wt 213.0 lb

## 2023-08-24 DIAGNOSIS — C61 Malignant neoplasm of prostate: Secondary | ICD-10-CM

## 2023-08-24 DIAGNOSIS — C7951 Secondary malignant neoplasm of bone: Secondary | ICD-10-CM | POA: Diagnosis not present

## 2023-08-24 DIAGNOSIS — Z79899 Other long term (current) drug therapy: Secondary | ICD-10-CM | POA: Diagnosis not present

## 2023-08-24 LAB — URINALYSIS, ROUTINE W REFLEX MICROSCOPIC
Bilirubin, UA: NEGATIVE
Glucose, UA: NEGATIVE
Ketones, UA: NEGATIVE
Nitrite, UA: NEGATIVE
Specific Gravity, UA: 1.025 (ref 1.005–1.030)
Urobilinogen, Ur: 0.2 mg/dL (ref 0.2–1.0)
pH, UA: 6.5 (ref 5.0–7.5)

## 2023-08-24 LAB — CBC WITH DIFFERENTIAL (CANCER CENTER ONLY)
Abs Immature Granulocytes: 0.04 10*3/uL (ref 0.00–0.07)
Basophils Absolute: 0.1 10*3/uL (ref 0.0–0.1)
Basophils Relative: 1 %
Eosinophils Absolute: 0.3 10*3/uL (ref 0.0–0.5)
Eosinophils Relative: 4 %
HCT: 41.1 % (ref 39.0–52.0)
Hemoglobin: 14.2 g/dL (ref 13.0–17.0)
Immature Granulocytes: 1 %
Lymphocytes Relative: 21 %
Lymphs Abs: 1.8 10*3/uL (ref 0.7–4.0)
MCH: 32.7 pg (ref 26.0–34.0)
MCHC: 34.5 g/dL (ref 30.0–36.0)
MCV: 94.7 fL (ref 80.0–100.0)
Monocytes Absolute: 0.7 10*3/uL (ref 0.1–1.0)
Monocytes Relative: 9 %
Neutro Abs: 5.7 10*3/uL (ref 1.7–7.7)
Neutrophils Relative %: 64 %
Platelet Count: 209 10*3/uL (ref 150–400)
RBC: 4.34 MIL/uL (ref 4.22–5.81)
RDW: 12 % (ref 11.5–15.5)
WBC Count: 8.6 10*3/uL (ref 4.0–10.5)
nRBC: 0 % (ref 0.0–0.2)

## 2023-08-24 LAB — COMPREHENSIVE METABOLIC PANEL
ALT: 19 U/L (ref 0–44)
AST: 22 U/L (ref 15–41)
Albumin: 4.2 g/dL (ref 3.5–5.0)
Alkaline Phosphatase: 64 U/L (ref 38–126)
Anion gap: 9 (ref 5–15)
BUN: 15 mg/dL (ref 8–23)
CO2: 27 mmol/L (ref 22–32)
Calcium: 9.3 mg/dL (ref 8.9–10.3)
Chloride: 103 mmol/L (ref 98–111)
Creatinine, Ser: 0.84 mg/dL (ref 0.61–1.24)
GFR, Estimated: >60 mL/min (ref >60)
Glucose, Bld: 194 mg/dL — ABNORMAL HIGH (ref 70–99)
Potassium: 4.3 mmol/L (ref 3.5–5.1)
Sodium: 139 mmol/L (ref 135–145)
Total Bilirubin: 0.6 mg/dL (ref <1.2)
Total Protein: 7 g/dL (ref 6.5–8.1)

## 2023-08-24 LAB — MICROSCOPIC EXAMINATION: WBC, UA: >30 /[HPF] — AB (ref 0–5)

## 2023-08-24 NOTE — Progress Notes (Signed)
Hematology and Oncology Follow Up Visit  William Phillips [id number] May 22, 1951 72 y.o. 08/24/2023   Principle Diagnosis:  Metastatic prostate cancer-castrate sensitive-oligometastatic  Current Therapy:   Degarelix 80 mg IM monthly Nubeqa 600 mg p.o. twice daily-started on 07/30/2023 Zometa 4 mg IV every 3 months -started on 09/28/2023   Interim History:  William Phillips is back for follow-up.  This is his second office visit.  He has castrate sensitive prostate cancer.  I must say the as PSA never really has been all that elevated.  When we last saw him, his PSA was 4.6.  William Phillips is has some pain in the right hip area.  I do not think we have to give him radiation for this right now.  Unfortunate, his insurance company would not let us do Xgeva.  Will have to get him set up with Zometa.  He otherwise, feels okay.  He has had no hot flashes.  He has had no problems with the Nubeqa.  There is been no diarrhea.  He has had no fever.  He has had no bleeding.  He has had no cough or shortness of breath.  He has had no rashes.  There is been no headache.  Overall, I would say that his performance status is probably William Phillips 0.  Medications:  Current Outpatient Medications:    amLODipine (NORVASC) 10 MG tablet, TAKE ONE-HALF TABLET BY MOUTH  TWICE DAILY, Disp: 100 tablet, Rfl: 2   apixaban (ELIQUIS) 5 MG TABS tablet, TAKE 1 TABLET BY MOUTH TWICE  DAILY, Disp: 200 tablet, Rfl: 1   Ascorbic Acid (VITAMIN C PO), Take 1 tablet by mouth daily., Disp: , Rfl:    Brimonidine Tartrate (LUMIFY) 0.025 % SOLN, Place 1 drop into both eyes every morning., Disp: , Rfl:    Calcium Carbonate-Vit D-Min (CALCIUM 1200 PO), Take 1,200 mg by mouth daily., Disp: , Rfl:    Carboxymethylcellulose Sodium (LUBRICANT EYE DROPS OP), Place 1 drop into both eyes 2 (two) times daily as needed (Dry eye)., Disp: , Rfl:    Clobetasol Propionate 0.05 % shampoo, Apply 1 application topically daily as needed (irritation)., Disp: ,  Rfl:    clotrimazole (CLOTRIMAZOLE ANTI-FUNGAL) 1 % cream, Apply 1 Application topically 2 (two) times daily., Disp: 30 g, Rfl: 0   darolutamide (NUBEQA) 300 MG tablet, Take 2 tablets (600 mg total) by mouth 2 (two) times daily with a meal., Disp: 120 tablet, Rfl: 0   fexofenadine (ALLEGRA) 180 MG tablet, Take 1 tablet (180 mg total) by mouth daily., Disp: , Rfl:    fluticasone (FLONASE) 50 MCG/ACT nasal spray, Place 2 sprays into both nostrils daily as needed for allergies or rhinitis., Disp: , Rfl: 2   ketoconazole (NIZORAL) 2 % shampoo, Apply 1 application topically daily as needed for irritation., Disp: , Rfl:    lisinopril-hydrochlorothiazide (ZESTORETIC) 20-12.5 MG tablet, Take 1 tablet by mouth daily., Disp: 90 tablet, Rfl: 1   metoprolol succinate (TOPROL-XL) 50 MG 24 hr tablet, TAKE 1 TABLET BY MOUTH TWICE  DAILY WITH OR IMMEDIATELY  FOLLOWING A MEAL, Disp: 200 tablet, Rfl: 2   Multiple Vitamin (MULTIVITAMIN) tablet, Take 1 tablet by mouth every evening., Disp: , Rfl:    omeprazole (PRILOSEC) 20 MG capsule, TAKE 1 CAPSULE BY MOUTH DAILY, Disp: 100 capsule, Rfl: 2   oxyCODONE-acetaminophen (PERCOCET) 5-325 MG tablet, Take 1 tablet by mouth every 6 (six) hours as needed., Disp: 10 tablet, Rfl: 0   rosuvastatin (CRESTOR) 5 MG tablet, Take 1 tablet (  5 mg total) by mouth daily., Disp: 30 tablet, Rfl: 5   sildenafil (REVATIO) 20 MG tablet, TAKE 1 TO 4 TABLETS BY MOUTH DAILY AS NEEDED, Disp: 90 tablet, Rfl: 0   silodosin (RAPAFLO) 8 MG CAPS capsule, Take 1 capsule (8 mg total) by mouth daily with breakfast., Disp: 90 capsule, Rfl: 3   VITAMIN D PO, Take 1 capsule by mouth daily. 1000 units daily., Disp: , Rfl:   Allergies:  Allergies  Allergen Reactions   Penicillins Swelling    irriation    Past Medical History, Surgical history, Social history, and Family History were reviewed and updated.  Review of Systems: Review of Systems  Constitutional: Negative.   HENT:  Negative.    Eyes:  Negative.   Respiratory: Negative.    Cardiovascular: Negative.   Gastrointestinal: Negative.   Endocrine: Negative.   Musculoskeletal:  Positive for back pain and flank pain.  Neurological: Negative.   Hematological: Negative.   Psychiatric/Behavioral: Negative.      Physical Exam:  height is 6' (1.829 m) and weight is 213 lb (96.6 kg). His oral temperature is 98.2 F (36.8 C). His blood pressure is 119/63 and his pulse is 116 (abnormal). His respiration is 19 and oxygen saturation is 98%.   Wt Readings from Last 3 Encounters:  08/24/23 213 lb (96.6 kg)  08/20/23 205 lb (93 kg)  08/12/23 212 lb (96.2 kg)    Physical Exam Vitals reviewed.  HENT:     Head: Normocephalic and atraumatic.  Eyes:     Pupils: Pupils are equal, round, and reactive to light.  Cardiovascular:     Rate and Rhythm: Normal rate and regular rhythm.     Heart sounds: Normal heart sounds.  Pulmonary:     Effort: Pulmonary effort is normal.     Breath sounds: Normal breath sounds.  Abdominal:     General: Bowel sounds are normal.     Palpations: Abdomen is soft.  Musculoskeletal:        General: No tenderness or deformity. Normal range of motion.     Cervical back: Normal range of motion.  Lymphadenopathy:     Cervical: No cervical adenopathy.  Skin:    General: Skin is warm and dry.     Findings: No erythema or rash.  Neurological:     Mental Status: He is alert and oriented to person, place, and time.  Psychiatric:        Behavior: Behavior normal.        Thought Content: Thought content normal.        Judgment: Judgment normal.      Lab Results  Component Value Date   WBC 8.6 08/24/2023   HGB 14.2 08/24/2023   HCT 41.1 08/24/2023   MCV 94.7 08/24/2023   PLT 209 08/24/2023     Chemistry      Component Value Date/Time   NA 139 08/24/2023 1029   NA 139 08/05/2022 1606   K 4.3 08/24/2023 1029   CL 103 08/24/2023 1029   CO2 27 08/24/2023 1029   BUN 15 08/24/2023 1029   BUN 10  08/05/2022 1606   CREATININE 0.84 08/24/2023 1029   CREATININE 0.94 07/29/2023 1110   CREATININE 0.77 11/29/2014 1512      Component Value Date/Time   CALCIUM 9.3 08/24/2023 1029   ALKPHOS 64 08/24/2023 1029   AST 22 08/24/2023 1029   AST 22 07/29/2023 1110   ALT 19 08/24/2023 1029   ALT 20 07/29/2023 1110  BILITOT 0.6 08/24/2023 1029   BILITOT 0.5 07/29/2023 1110      Impression and Plan: Mr. Dunkelberger is a very nice 72 year old white male.  He has metastatic prostate cancer.  He is on antiandrogen therapy right now.  I had to believe that this will be effective for him.  Again, his PSA really has not been all that high.  We will probably need to get another PSMA PET scan to see how things are going.  We will give him Zometa but we see him back.  I will have him come back in 1 month.  I am glad that his quality life is doing well.  Again I do not think we have to do radiation right now for the right hip area.  We will have to keep an eye on this.   Josph Macho, MD 11/11/202412:17 PM

## 2023-08-25 ENCOUNTER — Encounter: Payer: Self-pay | Admitting: *Deleted

## 2023-08-25 LAB — TESTOSTERONE: Testosterone: 8 ng/dL — ABNORMAL LOW (ref 264–916)

## 2023-08-25 LAB — URINE CULTURE

## 2023-08-25 LAB — PSA, TOTAL AND FREE
PSA, Free Pct: 25.3 %
PSA, Free: 0.48 ng/mL
Prostate Specific Ag, Serum: 1.9 ng/mL (ref 0.0–4.0)

## 2023-08-25 NOTE — Progress Notes (Signed)
Patient came in for his second office visit. He is doing well on his oral treatment. He is getting his degarelix with his urologist. He started on Zometa.   Patient qualifies for genetics. Will have this discussion with him at his next appointment.   Oncology Nurse Navigator Documentation     08/25/2023    9:30 AM  Oncology Nurse Navigator Flowsheets  Navigator Follow Up Date: 09/28/2023  Navigator Follow Up Reason: Follow-up Appointment  Navigator Location [facility]  Navigator Encounter Type Appt/Treatment Plan Review  Patient Visit Type MedOnc  Treatment Phase Active Tx  Barriers/Navigation Needs Coordination of Care;Education  Interventions None Required  Acuity Level 2-Minimal Needs (1-2 Barriers Identified)  Support Groups/Services Friends and Family  Time Spent with Patient 15

## 2023-08-26 MED ORDER — LEVOFLOXACIN 500 MG PO TABS: 500.0000 mg | ORAL_TABLET | Freq: Every day | ORAL | 0 refills | Status: AC

## 2023-08-26 NOTE — Addendum Note (Signed)
Addended by: Milderd Meager on: 08/26/2023 07:58 AM   Modules accepted: Orders

## 2023-08-31 ENCOUNTER — Telehealth: Payer: Self-pay

## 2023-08-31 NOTE — Telephone Encounter (Signed)
Oral Oncology Patient Advocate Encounter   Received notification that patient is due for re-enrollment for assistance for Nubeqa through [employer] [place] [employer].   Re-enrollment process has been initiated and will be submitted upon completion of necessary documents.  [employer]'s phone number [phone].   I will continue to follow until final determination.   Ardeen Fillers, CPhT Oncology Pharmacy Patient Advocate  [facility]  [phone] (phone) [phone] (fax) 08/31/2023 10:43 AM

## 2023-08-31 NOTE — Telephone Encounter (Signed)
Called and left VM for patient to return my call to set up time to obtain signatures for PAP Re-Enrollment. I will continue to reach out to patient. I will continue to follow and update until final determination.    Ardeen Fillers, CPhT Oncology Pharmacy Patient Advocate  [facility]  [phone] (phone) [phone] (fax) 08/31/2023 11:37 AM

## 2023-09-01 NOTE — Telephone Encounter (Signed)
Oral Oncology Patient Advocate Encounter  Reached out and spoke with patient regarding PAP paperwork, explained that I would send it to their preferred email via [employer].   Confirmed email address: [email or web address].    Patient expressed understanding and consent.  Will follow up once paperwork has been signed and returned.   Ardeen Fillers, CPhT Oncology Pharmacy Patient Advocate  [facility]  [phone] (phone) [phone] (fax) 09/01/2023 11:50 AM

## 2023-09-01 NOTE — Telephone Encounter (Signed)
Oral Oncology Patient Advocate Encounter   Submitted application for assistance for Nubeqa to [employer] [place] [employer].   Application submitted via e-fax to [phone]   [employer]'s phone number [phone].   I will continue to check the status until final determination.    Ardeen Fillers, CPhT Oncology Pharmacy Patient Advocate  [facility]  [phone] (phone) [phone] (fax) 09/01/2023 12:29 PM

## 2023-09-02 ENCOUNTER — Encounter: Payer: Medicare Other | Admitting: Dietician

## 2023-09-08 ENCOUNTER — Inpatient Hospital Stay: Payer: Medicare Other | Admitting: Dietician

## 2023-09-08 NOTE — Progress Notes (Signed)
Nutrition Follow Up: Reached out to patient at home telephone number, wife with him during the call.    Patient reports [place] beach and enjoyed some "good sea foods."  Appetite is good, energy levels fluctuate a bit but overall no barriers to eating to maintain weight.  Wife not with him for the call.  Recent weight fluctuation may be a result of scale discrepancies.  Medications: reviewed   Labs: Reviewed 08/24/23   Anthropometrics: some fluctuations, stable at cancer center scales  Height: 72" Weight:  08/24/23  213# 08/20/23  205# 08/12/23  212# UBW: 200-210 BMI: 28.89   NUTRITION DIAGNOSIS: Food and Nutrition Related Knowledge Deficit related to cancer and associated treatments as evidenced by no prior need for nutrition related information. resolved  INTERVENTION:   Encouraged continued attention to nutrient dense foods when possible to maintain weight/strength and QOL.   Encouraged him to reach out if he or his wife needed resources in future.  MONITORING, EVALUATION, GOAL: weight trends, nutrition impact symptoms, PO intake, labs  Goal is weight maintenance.  Next Visit: PRN at patient or provider request  Gennaro Africa, RDN, LDN Registered Dietitian, [facility] Part Time Remote (Usual office hours: Tuesday-Thursday) Mobile: [phone]

## 2023-09-22 ENCOUNTER — Ambulatory Visit: Payer: Medicare Other | Admitting: Urology

## 2023-09-22 NOTE — Progress Notes (Unsigned)
Assessment: 1. Prostate cancer (HCC); PSA 5.3; GG 1,2; unfavorable intermediate risk   2. 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 History of urinary retention   4. BPH with obstruction/lower urinary tract symptoms     Plan: Continue silodosin Continue androgen deprivation therapy today with Eligard 6 month depot Continue Nubeqa Daily vit D and calcium supplement Return to office in 3 months  Chief Complaint:  No chief complaint on file.   History of Present Illness:  William Phillips is a 72 y.o. male who is seen for continued evaluation of unfavorable intermediate risk prostate cancer with skeletal metastases, BPH with LUTS, and urinary retention.  He has a history of elevated PSA and BPH with LUTS. PSA results: 1/17 1.63 2/20 2.22 5/21 2.33 9/23 5.41 3/24 4.18 6/24 5.3  No prior prostate biopsy.  No history of UTIs or prostatitis.  No family history of prostate cancer. MRI of the prostate from 05/28/2023 showed a volume of 54.37 cm and PI-RADS 4 lesions in the right mid gland and apex and left apex.  He underwent fusion guided biopsy on 06/23/2023. Prostate volume measured approximately 86 g. Biopsy results: Gleason 3+4, Gleason 3+3 adenocarcinoma in 3/7 cores on left and 6/7 cores on right.  Both regions of interest were positive.  PSMA PET scan from 07/13/2023 showed avid uptake in the prostate with multifocal osseous metastases involving the posterior left iliac, right iliac, and L1 vertebral body.   He has a history of lower urinary tract symptoms for 4-5 years.  He had been on tamsulosin 0.4 mg for approximately 5 years.  He continued to have symptoms of frequency, voiding every 2 hours, occasional urgency and urge incontinence, and nocturia 2-5 times per night.  He reported voiding with a good stream.   No dysuria or gross hematuria. IPSS = 18. PVR = 181 ml.  He was given a trial of alfuzosin 10 mg daily at his visit in 3/24.  He reported  difficulty voiding while on the medication and was changed to silodosin. He continued on silodosin with improvement in his urinary symptoms with the medication.  He noted some improvement in his urinary stream as well as decreased nocturia and frequency.  No dysuria or gross hematuria. IPSS = 16.   He developed urinary retention approximately 4-5 days after the biopsy.  He was seen in the emergency room and a Foley catheter was placed with return of approximately 2000 mL.   His foley was removed on 07/06/23.  He returned to the ER that evening unable to void and his foley was replaced.  He presented for a voiding trial on 07/13/23.  He was able to void only a small amount.  He was also found to have paraphimosis which was reduced.   Due to the significant penile edema, his foley was replaced. His Foley catheter was removed and he was started on intermittent catheterization on 07/16/2023. He was continued on CIC and silodosin. He was started on ADT with firmagon on 07/23/23. He has been seen by Dr. Myna Hidalgo with Oncology and started on Nubeqa.  At his visit in November 2024, he was tolerating the androgen deprivation therapy well.  No hot flashes.  He had started on Nubeqa approximately 2 weeks prior and was tolerating this well.  He was voiding spontaneously.  He had not required intermittent catheterization for approximately 2 weeks.  He reported some frequency and intermittent stream.  No dysuria or gross hematuria. He continued on silodosin. IPSS =  17. Urine culture from 11/24:  >100K Enterococcus.  Treated with Levaquin x 7 days.  PSA 11/24: 0.48  Portions of the above documentation were copied from a prior visit for review purposes only.   Past Medical History:  Past Medical History:  Diagnosis Date   Allergic rhinitis    Ankle fracture 1998   Atrial flutter (HCC)    a. s/p CTI ablation by Dr Johney Frame 11/2014   Back pain 08/05/2015   BPH (benign prostatic hyperplasia) 08/26/2015    Ganglion cyst of dorsum of right wrist 04/08/2017   GERD (gastroesophageal reflux disease)    Hammer toe of right foot 05/06/2013   History of carpal tunnel syndrome    Hyperlipidemia    Hypertension    Insect bite 04/23/2015   OA (osteoarthritis) 02/06/2013   knees   Pedal edema 04/08/2017   Preventative health care 04/27/2011   Had normal colonoscopy in 2011     Past Surgical History:  Past Surgical History:  Procedure Laterality Date   A-FLUTTER ABLATION N/A 09/12/2021   Procedure: A-FLUTTER ABLATION;  Surgeon: Hillis Range, MD;  Location: [facility];  Service: Cardiovascular;  Laterality: N/A;   ABLATION  11-24-14   CTI ablation by Dr Johney Frame   ANKLE SURGERY     ATRIAL FIBRILLATION ABLATION N/A 09/02/2022   Procedure: ATRIAL FIBRILLATION ABLATION;  Surgeon: Maurice Small, MD;  Location: [facility];  Service: Cardiovascular;  Laterality: N/A;   ATRIAL FLUTTER ABLATION N/A 11/24/2014   Procedure: ATRIAL FLUTTER ABLATION;  Surgeon: Hillis Range, MD;  Location: [facility] CATH LAB;  Service: Cardiovascular;  Laterality: N/A;   CARDIOVERSION N/A 07/14/2022   Procedure: CARDIOVERSION;  Surgeon: Chrystie Nose, MD;  Location: [facility] ENDOSCOPY;  Service: Cardiovascular;  Laterality: N/A;   Carpel tunnel     COLONOSCOPY  08/04/2002   Normal Exam- Dr Shelbie Proctor HERNIA REPAIR  2008   TONSILLECTOMY      Allergies:  Allergies  Allergen Reactions   Penicillins Swelling    irriation    Family History:  Family History  Problem Relation Age of Onset   Arthritis Mother    Stroke Mother    Coronary artery disease Father        aortic valve disease   Heart attack Father 76   COPD Father    Hypertension Father    Hyperlipidemia Father    Dementia Father    Cancer Sister 108       breast cancer   Diabetes Sister    Diabetes type II Maternal Grandfather    Diabetes Maternal Grandfather    Hyperlipidemia Other    Hypertension Other     Social History:  Social  History   Tobacco Use   Smoking status: Former    Current packs/day: 0.00    Average packs/day: 1 pack/day for 10.0 years (10.0 ttl pk-yrs)    Types: Cigarettes    Start date: 45    Quit date: 1989    Years since quitting: 35.9   Smokeless tobacco: Never   Tobacco comments:    5-10 pack year history  Vaping Use   Vaping status: Never Used  Substance Use Topics   Alcohol use: Yes    Alcohol/week: 10.0 standard drinks of alcohol    Types: 10 Glasses of wine per week    Comment: 1-2 drinks daily (wine)   Drug use: No    ROS: Constitutional:  Negative for fever, chills, weight loss CV: Negative for  chest pain, previous MI, hypertension Respiratory:  Negative for shortness of breath, wheezing, sleep apnea, frequent cough GI:  Negative for nausea, vomiting, bloody stool, GERD  Physical exam: There were no vitals taken for this visit. GENERAL APPEARANCE:  Well appearing, well developed, well nourished, NAD HEENT:  Atraumatic, normocephalic, oropharynx clear NECK:  Supple without lymphadenopathy or thyromegaly ABDOMEN:  Soft, non-tender, no masses EXTREMITIES:  Moves all extremities well, without clubbing, cyanosis, or edema NEUROLOGIC:  Alert and oriented x 3, normal gait, CN II-XII grossly intact MENTAL STATUS:  appropriate BACK:  Non-tender to palpation, No CVAT SKIN:  Warm, dry, and intact  Results: U/A:

## 2023-09-23 ENCOUNTER — Ambulatory Visit: Payer: Medicare Other | Admitting: Urology

## 2023-09-23 ENCOUNTER — Encounter: Payer: Self-pay | Admitting: Urology

## 2023-09-23 VITALS — BP 131/76 | HR 74 | Ht 72.0 in | Wt 210.0 lb

## 2023-09-23 DIAGNOSIS — Z87898 Personal history of other specified conditions: Secondary | ICD-10-CM

## 2023-09-23 DIAGNOSIS — N401 Enlarged prostate with lower urinary tract symptoms: Secondary | ICD-10-CM

## 2023-09-23 DIAGNOSIS — C7951 Secondary malignant neoplasm of bone: Secondary | ICD-10-CM | POA: Diagnosis not present

## 2023-09-23 DIAGNOSIS — C61 Malignant neoplasm of prostate: Secondary | ICD-10-CM | POA: Diagnosis not present

## 2023-09-23 DIAGNOSIS — Z79818 Long term (current) use of other agents affecting estrogen receptors and estrogen levels: Secondary | ICD-10-CM | POA: Insufficient documentation

## 2023-09-23 DIAGNOSIS — N138 Other obstructive and reflux uropathy: Secondary | ICD-10-CM | POA: Diagnosis not present

## 2023-09-23 DIAGNOSIS — IMO0001 Reserved for inherently not codable concepts without codable children: Secondary | ICD-10-CM

## 2023-09-23 MED ORDER — LEUPROLIDE ACETATE (6 MONTH) 45 MG ~~LOC~~ KIT
45.0000 mg | PACK | Freq: Once | SUBCUTANEOUS | Status: AC
  Administered 2023-09-23: 45 mg via SUBCUTANEOUS

## 2023-09-23 NOTE — Progress Notes (Signed)
Eligard SubQ Injection   Due to Prostate Cancer patient is present today for a Eligard Injection.  Medication: Eligard 6 month Dose: 45 mg  Location: right upper outer buttocks  Lot: [id number] Exp: 03/2025   Patient tolerated well, no complications were noted  Performed by: Arville Go CMA   PA approval dates:  09/23/23 through 09/22/24. See under media.

## 2023-09-23 NOTE — Progress Notes (Signed)
Assessment: 1. Prostate cancer (HCC); PSA 5.3; GG 1,2; unfavorable intermediate risk   2. 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 Androgen deprivation therapy   4. BPH with obstruction/lower urinary tract symptoms   5. History of urinary retention     Plan: Continue silodosin Continue androgen deprivation therapy today with Eligard 6 month depot - next dose due after 03/21/24 Continue Nubeqa Daily vit D and calcium supplement Repeat imaging per Dr. Myna Hidalgo Return to office in 3 months  Chief Complaint:  Chief Complaint  Patient presents with   Prostate Cancer    History of Present Illness:  William Phillips is a 72 y.o. male who is seen for continued evaluation of unfavorable intermediate risk prostate cancer with skeletal metastases, BPH with LUTS, and urinary retention.  He has a history of elevated PSA and BPH with LUTS. PSA results: 1/17 1.63 2/20 2.22 5/21 2.33 9/23 5.41 3/24 4.18 6/24 5.3  No prior prostate biopsy.  No history of UTIs or prostatitis.  No family history of prostate cancer. MRI of the prostate from 05/28/2023 showed a volume of 54.37 cm and PI-RADS 4 lesions in the right mid gland and apex and left apex.  He underwent fusion guided biopsy on 06/23/2023. Prostate volume measured approximately 86 g. Biopsy results: Gleason 3+4, Gleason 3+3 adenocarcinoma in 3/7 cores on left and 6/7 cores on right.  Both regions of interest were positive.  PSMA PET scan from 07/13/2023 showed avid uptake in the prostate with multifocal osseous metastases involving the posterior left iliac, right iliac, and L1 vertebral body.   He has a history of lower urinary tract symptoms for 4-5 years.  He had been on tamsulosin 0.4 mg for approximately 5 years.  He continued to have symptoms of frequency, voiding every 2 hours, occasional urgency and urge incontinence, and nocturia 2-5 times per night.  He reported voiding with a good stream.   No dysuria or gross  hematuria. IPSS = 18. PVR = 181 ml.  He was given a trial of alfuzosin 10 mg daily at his visit in 3/24.  He reported difficulty voiding while on the medication and was changed to silodosin. He continued on silodosin with improvement in his urinary symptoms with the medication.  He noted some improvement in his urinary stream as well as decreased nocturia and frequency.  No dysuria or gross hematuria. IPSS = 16.   He developed urinary retention approximately 4-5 days after the biopsy.  He was seen in the emergency room and a Foley catheter was placed with return of approximately 2000 mL.   His foley was removed on 07/06/23.  He returned to the ER that evening unable to void and his foley was replaced.  He presented for a voiding trial on 07/13/23.  He was able to void only a small amount.  He was also found to have paraphimosis which was reduced.   Due to the significant penile edema, his foley was replaced. His Foley catheter was removed and he was started on intermittent catheterization on 07/16/2023. He was continued on CIC and silodosin. He was started on ADT with firmagon on 07/23/23. He has been seen by Dr. Myna Hidalgo with Oncology and started on Nubeqa.  At his visit in November 2024, he was tolerating the androgen deprivation therapy well.  No hot flashes.  He had started on Nubeqa approximately 2 weeks prior and was tolerating this well.  He was voiding spontaneously.  He had not required intermittent catheterization  for approximately 2 weeks.  He reported some frequency and intermittent stream.  No dysuria or gross hematuria. He continued on silodosin. IPSS = 17. Urine culture from 11/24:  >100K Enterococcus.  Treated with Levaquin x 7 days.  PSA 11/24: 0.48  He presents today for continued management of his castrate sensitive metastatic prostate cancer.  He is tolerating the androgen deprivation therapy well.  He has only occasional hot flashes.  He is doing well on Nubeqa.  No weight  loss or bone pain.  He continues on daily calcium and vitamin D supplementation.  He continues to void spontaneously.  He does have some occasional urgency and nocturia x 2.  No dysuria or gross hematuria. IPSS = 14.  Portions of the above documentation were copied from a prior visit for review purposes only.   Past Medical History:  Past Medical History:  Diagnosis Date   Allergic rhinitis    Ankle fracture 1998   Atrial flutter (HCC)    a. s/p CTI ablation by Dr Johney Frame 11/2014   Back pain 08/05/2015   BPH (benign prostatic hyperplasia) 08/26/2015   Ganglion cyst of dorsum of right wrist 04/08/2017   GERD (gastroesophageal reflux disease)    Hammer toe of right foot 05/06/2013   History of carpal tunnel syndrome    Hyperlipidemia    Hypertension    Insect bite 04/23/2015   OA (osteoarthritis) 02/06/2013   knees   Pedal edema 04/08/2017   Preventative health care 04/27/2011   Had normal colonoscopy in 2011     Past Surgical History:  Past Surgical History:  Procedure Laterality Date   A-FLUTTER ABLATION N/A 09/12/2021   Procedure: A-FLUTTER ABLATION;  Surgeon: Hillis Range, MD;  Location: [facility] LAB;  Service: Cardiovascular;  Laterality: N/A;   ABLATION  11-24-14   CTI ablation by Dr Johney Frame   ANKLE SURGERY     ATRIAL FIBRILLATION ABLATION N/A 09/02/2022   Procedure: ATRIAL FIBRILLATION ABLATION;  Surgeon: Maurice Small, MD;  Location: [facility] INVAS[facility] CV LAB;  Service: Cardiovascular;  Laterality: N/A;   ATRIAL FLUTTER ABLATION N/A 11/24/2014   Procedure: ATRIAL FLUTTER ABLATION;  Surgeon: Hillis Range, MD;  Location: [facility] CATH LAB;  Service: Cardiovascular;  Laterality: N/A;   CARDIOVERSION N/A 07/14/2022   Procedure: CARDIOVERSION;  Surgeon: Chrystie Nose, MD;  Location: [facility] ENDOSCOPY;  Service: Cardiovascular;  Laterality: N/A;   Carpel tunnel     COLONOSCOPY  08/04/2002   Normal Exam- Dr Shelbie Proctor HERNIA REPAIR  2008   TONSILLECTOMY      Allergies:   Allergies  Allergen Reactions   Penicillins Swelling    irriation    Family History:  Family History  Problem Relation Age of Onset   Arthritis Mother    Stroke Mother    Coronary artery disease Father        aortic valve disease   Heart attack Father 11   COPD Father    Hypertension Father    Hyperlipidemia Father    Dementia Father    Cancer Sister 40       breast cancer   Diabetes Sister    Diabetes type II Maternal Grandfather    Diabetes Maternal Grandfather    Hyperlipidemia Other    Hypertension Other     Social History:  Social History   Tobacco Use   Smoking status: Former    Current packs/day: 0.00    Average packs/day: 1 pack/day for 10.0 years (10.0 ttl pk-yrs)  Types: Cigarettes    Start date: 44    Quit date: 1989    Years since quitting: 35.9   Smokeless tobacco: Never   Tobacco comments:    5-10 pack year history  Vaping Use   Vaping status: Never Used  Substance Use Topics   Alcohol use: Yes    Alcohol/week: 10.0 standard drinks of alcohol    Types: 10 Glasses of wine per week    Comment: 1-2 drinks daily (wine)   Drug use: No    ROS: Constitutional:  Negative for fever, chills, weight loss CV: Negative for chest pain, previous MI, hypertension Respiratory:  Negative for shortness of breath, wheezing, sleep apnea, frequent cough GI:  Negative for nausea, vomiting, bloody stool, GERD  Physical exam: BP 131/76   Pulse 74   Ht 6' (1.829 m)   Wt 210 lb (95.3 kg)   BMI 28.48 kg/m  GENERAL APPEARANCE:  Well appearing, well developed, well nourished, NAD HEENT:  Atraumatic, normocephalic, oropharynx clear NECK:  Supple without lymphadenopathy or thyromegaly ABDOMEN:  Soft, non-tender, no masses EXTREMITIES:  Moves all extremities well, without clubbing, cyanosis, or edema NEUROLOGIC:  Alert and oriented x 3, normal gait, CN II-XII grossly intact MENTAL STATUS:  appropriate BACK:  Non-tender to palpation, No CVAT SKIN:  Warm, dry,  and intact  Results: U/A:

## 2023-09-28 ENCOUNTER — Encounter: Payer: Self-pay | Admitting: Family Medicine

## 2023-09-28 ENCOUNTER — Encounter: Payer: Self-pay | Admitting: Hematology & Oncology

## 2023-09-28 ENCOUNTER — Encounter: Payer: Self-pay | Admitting: *Deleted

## 2023-09-28 ENCOUNTER — Inpatient Hospital Stay: Payer: Medicare Other

## 2023-09-28 ENCOUNTER — Inpatient Hospital Stay: Payer: Medicare Other | Attending: Hematology & Oncology

## 2023-09-28 ENCOUNTER — Inpatient Hospital Stay (HOSPITAL_BASED_OUTPATIENT_CLINIC_OR_DEPARTMENT_OTHER): Payer: Medicare Other | Admitting: Hematology & Oncology

## 2023-09-28 ENCOUNTER — Other Ambulatory Visit: Payer: Self-pay

## 2023-09-28 VITALS — BP 104/66 | HR 77 | Temp 97.7°F | Resp 19 | Ht 72.0 in | Wt 210.0 lb

## 2023-09-28 VITALS — BP 115/76 | HR 69 | Resp 18

## 2023-09-28 DIAGNOSIS — C7951 Secondary malignant neoplasm of bone: Secondary | ICD-10-CM | POA: Insufficient documentation

## 2023-09-28 DIAGNOSIS — C61 Malignant neoplasm of prostate: Secondary | ICD-10-CM | POA: Diagnosis not present

## 2023-09-28 LAB — CMP (CANCER CENTER ONLY)
ALT: 20 U/L (ref 0–44)
AST: 27 U/L (ref 15–41)
Albumin: 4 g/dL (ref 3.5–5.0)
Alkaline Phosphatase: 63 U/L (ref 38–126)
Anion gap: 9 (ref 5–15)
BUN: 16 mg/dL (ref 8–23)
CO2: 27 mmol/L (ref 22–32)
Calcium: 9.4 mg/dL (ref 8.9–10.3)
Chloride: 101 mmol/L (ref 98–111)
Creatinine: 0.83 mg/dL (ref 0.61–1.24)
GFR, Estimated: >60 mL/min (ref >60)
Glucose, Bld: 239 mg/dL — ABNORMAL HIGH (ref 70–99)
Potassium: 3.9 mmol/L (ref 3.5–5.1)
Sodium: 137 mmol/L (ref 135–145)
Total Bilirubin: 0.9 mg/dL (ref <1.2)
Total Protein: 6.8 g/dL (ref 6.5–8.1)

## 2023-09-28 LAB — CBC WITH DIFFERENTIAL (CANCER CENTER ONLY)
Abs Immature Granulocytes: 0.04 10*3/uL (ref 0.00–0.07)
Basophils Absolute: 0 10*3/uL (ref 0.0–0.1)
Basophils Relative: 0 %
Eosinophils Absolute: 0.4 10*3/uL (ref 0.0–0.5)
Eosinophils Relative: 5 %
HCT: 40.2 % (ref 39.0–52.0)
Hemoglobin: 13.6 g/dL (ref 13.0–17.0)
Immature Granulocytes: 0 %
Lymphocytes Relative: 20 %
Lymphs Abs: 1.9 10*3/uL (ref 0.7–4.0)
MCH: 32.7 pg (ref 26.0–34.0)
MCHC: 33.8 g/dL (ref 30.0–36.0)
MCV: 96.6 fL (ref 80.0–100.0)
Monocytes Absolute: 0.8 10*3/uL (ref 0.1–1.0)
Monocytes Relative: 8 %
Neutro Abs: 6.6 10*3/uL (ref 1.7–7.7)
Neutrophils Relative %: 67 %
Platelet Count: 177 10*3/uL (ref 150–400)
RBC: 4.16 MIL/uL — ABNORMAL LOW (ref 4.22–5.81)
RDW: 12.9 % (ref 11.5–15.5)
WBC Count: 9.8 10*3/uL (ref 4.0–10.5)
nRBC: 0 % (ref 0.0–0.2)

## 2023-09-28 MED ORDER — ZOLEDRONIC ACID 4 MG/100ML IV SOLN
4.0000 mg | Freq: Once | INTRAVENOUS | Status: AC
  Administered 2023-09-28: 4 mg via INTRAVENOUS
  Filled 2023-09-28: qty 100

## 2023-09-28 MED ORDER — SODIUM CHLORIDE 0.9 % IV SOLN: INTRAVENOUS | Status: DC

## 2023-09-28 NOTE — Telephone Encounter (Signed)
Oral Oncology Patient Advocate Encounter   Received notification re-enrollment for assistance for Nubeqa through [place] [facility] has been approved. Patient may continue to receive their medication at $0 from this program.    [employer]'s phone number [phone].   Effective dates: 10/14/23 through 10/12/24.   Ardeen Fillers, CPhT Oncology Pharmacy Patient Advocate  [facility]  [phone] (phone) [phone] (fax) 09/28/2023 1:26 PM

## 2023-09-28 NOTE — Progress Notes (Signed)
Patient c/o bone pain. PSMA PET ordered to evaluate disease and for possible radiation planning. PET cannot be scheduled until auth is obtained. Will follow up later this week for scheduling.   Oncology Nurse Navigator Documentation     09/28/2023    8:30 AM  Oncology Nurse Navigator Flowsheets  Navigator Follow Up Date: 10/02/2023  Navigator Follow Up Reason: Radiology  Navigator Location [facility]  Navigator Encounter Type Appt/Treatment Plan Review  Patient Visit Type MedOnc  Treatment Phase Active Tx  Barriers/Navigation Needs Coordination of Care;Education  Interventions None Required  Acuity Level 2-Minimal Needs (1-2 Barriers Identified)  Support Groups/Services Friends and Family  Time Spent with Patient 15

## 2023-09-28 NOTE — Telephone Encounter (Signed)
 Care team updated and letter sent for eye exam notes.

## 2023-09-28 NOTE — Progress Notes (Signed)
Hematology and Oncology Follow Up Visit  William Phillips [id number] 03-14-1951 72 y.o. 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 twice daily-started on 07/30/2023 Zometa 4 mg IV every 3 months -started on 09/28/2023   Interim History:  William Phillips is back for follow-up.  He is doing okay.  He is having some more pain in the back.  We had to get another PSMA PET scan on him to see everything looks.  I suspect his bike and the need radiotherapy.  He is doing okay on the [place]  He is castrate level testosterone.  His last PSA was 1.9.  He does have some hot flashes.  He has had no change in bowel or bladder habits.  He has had no pain in the legs.  There is been no weakness.  He has had no fever.  He has had no cough or shortness of breath.  He is doing well with the Nubeqa.  His last testosterone level was 8.  Currently, I would say that perform status is probably ECOG 1.    Medications:  Current Outpatient Medications:    amLODipine (NORVASC) 10 MG tablet, TAKE ONE-HALF TABLET BY MOUTH  TWICE DAILY, Disp: 100 tablet, Rfl: 2   apixaban (ELIQUIS) 5 MG TABS tablet, TAKE 1 TABLET BY MOUTH TWICE  DAILY, Disp: 200 tablet, Rfl: 1   Ascorbic Acid (VITAMIN C PO), Take 1 tablet by mouth daily., Disp: , Rfl:    Brimonidine Tartrate (LUMIFY) 0.025 % SOLN, Place 1 drop into both eyes every morning., Disp: , Rfl:    Calcium Carbonate-Vit D-Min (CALCIUM 1200 PO), Take 1,200 mg by mouth daily., Disp: , Rfl:    Carboxymethylcellulose Sodium (LUBRICANT EYE DROPS OP), Place 1 drop into both eyes 2 (two) times daily as needed (Dry eye)., Disp: , Rfl:    Clobetasol Propionate 0.05 % shampoo, Apply 1 application topically daily as needed (irritation)., Disp: , Rfl:    clotrimazole (CLOTRIMAZOLE ANTI-FUNGAL) 1 % cream, Apply 1 Application topically 2 (two)  times daily., Disp: 30 g, Rfl: 0   darolutamide (NUBEQA) 300 MG tablet, Take 2 tablets (600 mg total) by mouth 2 (two) times daily with a meal., Disp: 120 tablet, Rfl: 0   fexofenadine (ALLEGRA) 180 MG tablet, Take 1 tablet (180 mg total) by mouth daily., Disp: , Rfl:    fluticasone (FLONASE) 50 MCG/ACT nasal spray, Place 2 sprays into both nostrils daily as needed for allergies or rhinitis., Disp: , Rfl: 2   ketoconazole (NIZORAL) 2 % shampoo, Apply 1 application topically daily as needed for irritation., Disp: , Rfl:    lisinopril-hydrochlorothiazide (ZESTORETIC) 20-12.5 MG tablet, Take 1 tablet by mouth daily., Disp: 90 tablet, Rfl: 1   metoprolol succinate (TOPROL-XL) 50 MG 24 hr tablet, TAKE 1 TABLET BY MOUTH TWICE  DAILY WITH OR IMMEDIATELY  FOLLOWING A MEAL, Disp: 200 tablet, Rfl: 2   Multiple Vitamin (MULTIVITAMIN) tablet, Take 1 tablet by mouth every evening., Disp: , Rfl:    omeprazole (PRILOSEC) 20 MG capsule, TAKE 1 CAPSULE BY MOUTH DAILY, Disp: 100 capsule, Rfl: 2   oxyCODONE-acetaminophen (PERCOCET) 5-325 MG tablet, Take 1 tablet by mouth every 6 (six) hours as needed., Disp: 10 tablet, Rfl: 0   rosuvastatin (CRESTOR) 5 MG tablet, Take 1 tablet (5 mg total) by mouth daily., Disp: 30 tablet, Rfl: 5   sildenafil (REVATIO) 20 MG  tablet, TAKE 1 TO 4 TABLETS BY MOUTH DAILY AS NEEDED, Disp: 90 tablet, Rfl: 0   silodosin (RAPAFLO) 8 MG CAPS capsule, Take 1 capsule (8 mg total) by mouth daily with breakfast., Disp: 90 capsule, Rfl: 3   VITAMIN D PO, Take 1 capsule by mouth daily. 1000 units daily., Disp: , Rfl:   Allergies:  Allergies  Allergen Reactions   Penicillins Swelling    irriation    Past Medical History, Surgical history, Social history, and Family History were reviewed and updated.  Review of Systems: Review of Systems  Constitutional: Negative.   HENT:  Negative.    Eyes: Negative.   Respiratory: Negative.    Cardiovascular: Negative.   Gastrointestinal: Negative.    Endocrine: Negative.   Musculoskeletal:  Positive for back pain and flank pain.  Neurological: Negative.   Hematological: Negative.   Psychiatric/Behavioral: Negative.      Physical Exam: Temperature 97.7.  Pulse 77.  Blood pressure 104/66.  Weight is 210 pounds.  Wt Readings from Last 3 Encounters:  09/23/23 210 lb (95.3 kg)  08/24/23 213 lb (96.6 kg)  08/20/23 205 lb (93 kg)    Physical Exam Vitals reviewed.  HENT:     Head: Normocephalic and atraumatic.  Eyes:     Pupils: Pupils are equal, round, and reactive to light.  Cardiovascular:     Rate and Rhythm: Normal rate and regular rhythm.     Heart sounds: Normal heart sounds.  Pulmonary:     Effort: Pulmonary effort is normal.     Breath sounds: Normal breath sounds.  Abdominal:     General: Bowel sounds are normal.     Palpations: Abdomen is soft.  Musculoskeletal:        General: No tenderness or deformity. Normal range of motion.     Cervical back: Normal range of motion.  Lymphadenopathy:     Cervical: No cervical adenopathy.  Skin:    General: Skin is warm and dry.     Findings: No erythema or rash.  Neurological:     Mental Status: He is alert and oriented to person, place, and time.  Psychiatric:        Behavior: Behavior normal.        Thought Content: Thought content normal.        Judgment: Judgment normal.      Lab Results  Component Value Date   WBC 9.8 09/28/2023   HGB 13.6 09/28/2023   HCT 40.2 09/28/2023   MCV 96.6 09/28/2023   PLT 177 09/28/2023     Chemistry      Component Value Date/Time   NA 139 08/24/2023 1029   NA 139 08/05/2022 1606   K 4.3 08/24/2023 1029   CL 103 08/24/2023 1029   CO2 27 08/24/2023 1029   BUN 15 08/24/2023 1029   BUN 10 08/05/2022 1606   CREATININE 0.84 08/24/2023 1029   CREATININE 0.94 07/29/2023 1110   CREATININE 0.77 11/29/2014 1512      Component Value Date/Time   CALCIUM 9.3 08/24/2023 1029   ALKPHOS 64 08/24/2023 1029   AST 22 08/24/2023 1029    AST 22 07/29/2023 1110   ALT 19 08/24/2023 1029   ALT 20 07/29/2023 1110   BILITOT 0.6 08/24/2023 1029   BILITOT 0.5 07/29/2023 1110      Impression and Plan: William Phillips is a very nice 72 year old white male.  He has metastatic prostate cancer.  He is on antiandrogen therapy right now.  I had  to believe that this will be effective for him.  Again, his PSA really has not been all that high.    We will get another PSMA PET scan on him so we see what the bones look like so we can see about radiotherapy.  I think radiotherapy would not be a bad idea for him.  He does get Zometa.  I think this will help.  I will plan to see him back in January.    Josph Macho, MD 12/16/20248:43 AM

## 2023-09-28 NOTE — Patient Instructions (Signed)

## 2023-09-29 ENCOUNTER — Telehealth: Payer: Self-pay | Admitting: Family Medicine

## 2023-09-29 ENCOUNTER — Telehealth: Payer: Self-pay | Admitting: Urology

## 2023-09-29 LAB — PSA, TOTAL AND FREE
PSA, Free Pct: >30 %
PSA, Free: 0.03 ng/mL
Prostate Specific Ag, Serum: <0.1 ng/mL (ref 0.0–4.0)

## 2023-09-29 LAB — TESTOSTERONE: Testosterone: 18 ng/dL — ABNORMAL LOW (ref 264–916)

## 2023-09-29 NOTE — Telephone Encounter (Signed)
Patient called and stated that he was told that Dr. Abner Greenspan needs to send in a rx to treat a UTI. He stated that he was originally taking levofloxacin [facility]) that was prescribed by Milderd Meager., MD . Please call and advise with patient.

## 2023-09-29 NOTE — Telephone Encounter (Signed)
Levofloxacin  Patient needs a refill on this medication

## 2023-09-30 ENCOUNTER — Encounter: Payer: Self-pay | Admitting: *Deleted

## 2023-09-30 ENCOUNTER — Other Ambulatory Visit: Payer: Medicare Other

## 2023-09-30 ENCOUNTER — Other Ambulatory Visit: Payer: Self-pay | Admitting: Emergency Medicine

## 2023-09-30 DIAGNOSIS — Z87440 Personal history of urinary (tract) infections: Secondary | ICD-10-CM | POA: Diagnosis not present

## 2023-09-30 DIAGNOSIS — R300 Dysuria: Secondary | ICD-10-CM | POA: Diagnosis not present

## 2023-09-30 NOTE — Telephone Encounter (Signed)
Pt said he is having some burning with urination. I's doing a little better today.

## 2023-09-30 NOTE — Telephone Encounter (Signed)
Returned pt's call. Pt states he called requesting levaquin bc he felt he was getting a UTI after his infusion with Dr Myna Hidalgo. Pt states he is going to see Dr Abner Greenspan this afternoon to give a urine sample and Dr Abner Greenspan will treat if necessary.

## 2023-09-30 NOTE — Telephone Encounter (Signed)
Called patient this morning. He is going to bring in a urine specimen. Will make a  future lab appointment and order urine culture.

## 2023-10-01 ENCOUNTER — Telehealth: Payer: Self-pay | Admitting: Emergency Medicine

## 2023-10-01 NOTE — Telephone Encounter (Signed)
Copied from CRM [id number]. Topic: Clinical - Lab/Test Results >> Oct 01, 2023 12:52 PM Suzette B wrote: Reason for CRM: patient's spouse called stating she and the patient called earlier to check status of the lab results I advised that the labs had not been released as of yet, she stated it should take long for results for a dipstick UA, advised spouse it was a urine culture ordered by provider. Spouse requested a call back in regards to the testing

## 2023-10-01 NOTE — Telephone Encounter (Signed)
Called patient back and let him know it takes a couple of days for urine culture results

## 2023-10-02 ENCOUNTER — Ambulatory Visit: Payer: Self-pay | Admitting: Family Medicine

## 2023-10-02 ENCOUNTER — Encounter: Payer: Self-pay | Admitting: *Deleted

## 2023-10-02 ENCOUNTER — Telehealth: Payer: Self-pay | Admitting: *Deleted

## 2023-10-02 LAB — URINE CULTURE
MICRO NUMBER:: 15866517
SPECIMEN QUALITY:: ADEQUATE

## 2023-10-02 MED ORDER — SULFAMETHOXAZOLE-TRIMETHOPRIM 800-160 MG PO TABS: 1.0000 | ORAL_TABLET | Freq: Two times a day (BID) | ORAL | 0 refills | Status: AC

## 2023-10-02 NOTE — Telephone Encounter (Signed)
Copied from CRM [id number]. Topic: Clinical - Red Word Triage >> Oct 02, 2023  4:04 PM [name] wrote: Red Word that prompted transfer to Nurse Triage: burning and bleeding, Patients wife calling back to speak with nurse  Chief Complaint: UTI Symptoms: SEE previous triage Frequency: na Pertinent Negatives: Patient denies na Disposition: [] ED /[x] Urgent Care (no appt availability in office) / [] Appointment(In office/virtual)/ []  [facility]/ [] Home Care/ [] Refused Recommended Disposition /[] [place] [facility]/ []  Follow-up with PCP Additional Notes: Wife requests a call back asap!  Husband had Ua c&s completed.  Requests call back ASAP.  Instructed to go to UC if office does not call back.  States multiple messages sent and patient had urine sample completed on 09/30/23. Wife declined to answer any more questions and did not wait for all of homecare advice.  Patient heard in the background telling the triage nurse to "fuck off". This nurse called office and spoke to MA who states Dr. Is not in today.  States he will consult with Dr. Rennis Petty the day to see if he can order treatment. Dr. Did order medicine.  Called wife back and updated.     Reason for Disposition  RN needs further essential information from caller in order to complete triage  Answer Assessment - Initial Assessment Questions 1. REASON FOR CALL or QUESTION: "What is your reason for calling today?" or "How can I best help you?" or "What question do you have that I can help answer?"     Needs results on ua.  Upset because office has not called her back.  Protocols used: Information Only Call - No Triage-A-AH

## 2023-10-02 NOTE — Telephone Encounter (Signed)
Patient's wife called in stating she had been in touch with Dr. Elby Showers office today and was strictly calling back in to see if someone had called in a prescription for his abnormal urine culture. While we were on the phone, a nurse in Dr. Mariel Aloe office was calling in to her husband's telephone. Everything has been settled at this time. Informed patient before hang up that if her husband has any worsening symptoms, please give [place] a call back.  Copied from CRM [id number]. Topic: Clinical - Red Word Triage >> Oct 02, 2023  4:04 PM William Phillips wrote: Red Word that prompted transfer to Nurse Triage: burning and bleeding, Patients wife calling back to speak with nurse

## 2023-10-02 NOTE — Telephone Encounter (Signed)
Urine culture + August 20, 2023 with Enterococcus. Was Rx Levaquin. December 17: The patient reported burning with urination, PCP ordered a urine culture + for Staphylococcus epidermidis, allergic to PCN. Plan: Bactrim DS 1 p.o. twice daily #20 no refills. Push fluids. If fever, chills, severe symptoms: Go to the ER

## 2023-10-02 NOTE — Progress Notes (Signed)
Patient's specialty PET scheduled for 10/12/2023.  Oncology Nurse Navigator Documentation     10/02/2023    9:15 AM  Oncology Nurse Navigator Flowsheets  Navigator Follow Up Date: 10/12/2023  Navigator Follow Up Reason: Scan Review  Navigator Location [facility]  Navigator Encounter Type Appt/Treatment Plan Review  Patient Visit Type MedOnc  Treatment Phase Active Tx  Barriers/Navigation Needs Coordination of Care;Education  Interventions None Required  Acuity Level 2-Minimal Needs (1-2 Barriers Identified)  Support Groups/Services Friends and Family  Time Spent with Patient 15

## 2023-10-02 NOTE — Telephone Encounter (Signed)
Reason for Disposition  . [1] Other NON-URGENT information for PCP AND [2] does not require PCP response    Protocols used: PCP Call - No Triage-A-AH

## 2023-10-02 NOTE — Telephone Encounter (Signed)
Spoke with triage nurse and she will relay Dr. Drue Novel message to patient wife.  She stated that pt wife was very upset and angry and she will just handle.  Advised that rx will be sent into [facility].    Rx sent in.

## 2023-10-02 NOTE — Telephone Encounter (Addendum)
Spoke with pt wife Aurea Graff and she wanted to know why the culture has not resulted yet.  Patient dropped off urine on 12/18 for culture and results are not back yet.  Wife states that patient has not gotten worse.  She would like an antibiotic sent in before we close because of the weekend.   Pt has done well in the past on Levaquin.  He has a hx of prostate cancer.  Will you be able to send in something for patient?  Can call wife back at [phone].

## 2023-10-02 NOTE — Telephone Encounter (Signed)
Copied from CRM [id number]. Topic: Clinical - Red Word Triage >> Oct 02, 2023  2:16 PM William Phillips wrote: Red Word that prompted transfer to Nurse Triage: burning and bleeding   Chief Complaint: Urinary Symptoms Symptoms: Dysuria, Hematuria  Frequency: Since Tuesday Pertinent Negatives: Patient denies acute distress like symptoms.  Disposition: [] ED /[] Urgent Care (no appt availability in office) / [] Appointment(In office/virtual)/ []  [place] [facility]/ [] Home Care/ [] Refused Recommended Disposition /[] [facility]/ [x]  Follow-up with PCP Additional Notes: William Phillips is a 72 year old male being triaged regarding his urinary symptoms. Spoke with William Phillips, his wife, for triage. While triaging the patient for symptoms, the wife adamantly requested lab results for a urine culture taken on the 18 th. Informed her the results were not available nor resulted at this time. [name] then questioned could we just contact the Resulting Agency directly to obtain a result before 5 PM today for care going into the weekend. Neither Mr. Nor Mrs. Routhier were receptive of a disposition regarding an in office visit, despite education regarding acute symptoms and how symptoms can arise. Contact CAL for [facility], and caller transferred to Dr. Mariel Aloe nurse for further assistance. Then transferred to the lab tech for more assistance. Information routed to the office. In office Lab Technician then informed [name] that the lab results despite being preliminary did not show or grow anything at this time.    Reason for Disposition  Urinating more frequently than usual (i.e., frequency)  Answer Assessment - Initial Assessment Questions 1. SYMPTOM: "What's the main symptom you're concerned about?" (e.g., frequency, incontinence)     Dysuria, Frequency, Burning with Urination 2. ONSET: "When did the  symptoms  start?"     Tuesday 3. PAIN: "Is there any pain?" If Yes, ask: "How bad is it?" (Scale:  1-10; mild, moderate, severe)      Pelvic and Lower Back Pain,  4. CAUSE: "What do you think is causing the symptoms?"     Unsure 5. OTHER SYMPTOMS: "Do you have any other symptoms?" (e.g., blood in urine, fever, flank pain, pain with urination)     Hematuria 6. PREGNANCY: "Is there any chance you are pregnant?" "When was your last menstrual period?"     N/A  Protocols used: Urinary Symptoms-A-AH

## 2023-10-03 ENCOUNTER — Other Ambulatory Visit: Payer: Self-pay | Admitting: Family

## 2023-10-12 ENCOUNTER — Encounter (HOSPITAL_COMMUNITY)
Admission: RE | Admit: 2023-10-12 | Discharge: 2023-10-12 | Disposition: A | Discharge status: Home / Self Care | Payer: Medicare Other | Source: Ambulatory Visit | Attending: Hematology & Oncology | Admitting: Hematology & Oncology

## 2023-10-12 ENCOUNTER — Encounter: Payer: Self-pay | Admitting: *Deleted

## 2023-10-12 ENCOUNTER — Encounter: Payer: Self-pay | Admitting: Family Medicine

## 2023-10-12 ENCOUNTER — Other Ambulatory Visit: Payer: Self-pay | Admitting: Emergency Medicine

## 2023-10-12 DIAGNOSIS — Z87440 Personal history of urinary (tract) infections: Secondary | ICD-10-CM

## 2023-10-12 DIAGNOSIS — C61 Malignant neoplasm of prostate: Secondary | ICD-10-CM | POA: Diagnosis present

## 2023-10-12 DIAGNOSIS — C7951 Secondary malignant neoplasm of bone: Secondary | ICD-10-CM | POA: Insufficient documentation

## 2023-10-12 MED ORDER — FLOTUFOLASTAT F 18 GALLIUM 296-5846 MBQ/ML IV SOLN
8.5700 | Freq: Once | INTRAVENOUS | Status: AC
  Administered 2023-10-12: 8.57 via INTRAVENOUS
  Filled 2023-10-12: qty 9

## 2023-10-12 MED ORDER — NITROFURANTOIN MONOHYD MACRO 100 MG PO CAPS: 100.0000 mg | ORAL_CAPSULE | Freq: Two times a day (BID) | ORAL | 0 refills | Status: DC

## 2023-10-12 NOTE — Telephone Encounter (Signed)
Called and left a message on both William Phillips and his wife's cell phone. Awaiting return call.

## 2023-10-12 NOTE — Telephone Encounter (Signed)
Pt called back. Lab appointment was made for UA and culture

## 2023-10-13 ENCOUNTER — Other Ambulatory Visit (INDEPENDENT_AMBULATORY_CARE_PROVIDER_SITE_OTHER): Payer: Medicare Other

## 2023-10-13 ENCOUNTER — Ambulatory Visit: Payer: Medicare Other | Admitting: *Deleted

## 2023-10-13 DIAGNOSIS — Z0000 Encounter for general adult medical examination without abnormal findings: Secondary | ICD-10-CM

## 2023-10-13 DIAGNOSIS — Z87440 Personal history of urinary (tract) infections: Secondary | ICD-10-CM

## 2023-10-13 DIAGNOSIS — R350 Frequency of micturition: Secondary | ICD-10-CM | POA: Diagnosis not present

## 2023-10-13 LAB — URINALYSIS, ROUTINE W REFLEX MICROSCOPIC
Bilirubin Urine: NEGATIVE
Hgb urine dipstick: NEGATIVE
Ketones, ur: NEGATIVE
Nitrite: NEGATIVE
Specific Gravity, Urine: 1.015 (ref 1.000–1.030)
Total Protein, Urine: NEGATIVE
Urine Glucose: 100 — AB
Urobilinogen, UA: 0.2 (ref 0.0–1.0)
pH: 6 (ref 5.0–8.0)

## 2023-10-13 NOTE — Patient Instructions (Signed)
 William Phillips , Thank you for taking time to come for your Medicare Wellness Visit. I appreciate your ongoing commitment to your health goals. Please review the following plan we discussed and let me know if I can assist you in the future.   These are the goals we discussed:  Goals      Maintain healthy active lifestyle.        This is a list of the screening recommended for you and due dates:  Health Maintenance  Topic Date Due   Complete foot exam   Never done   Eye exam for diabetics  Never done   Yearly kidney health urinalysis for diabetes  10/18/2016   COVID-19 Vaccine (3 - Moderna risk series) 11/20/2020   Flu Shot  01/11/2024*   Hemoglobin A1C  12/16/2023   Yearly kidney function blood test for diabetes  09/27/2024   Medicare Annual Wellness Visit  10/12/2024   Cologuard (Stool DNA test)  10/16/2024   DTaP/Tdap/Td vaccine (3 - Td or Tdap) 08/23/2025   Pneumonia Vaccine  Completed   Hepatitis C Screening  Completed   Zoster (Shingles) Vaccine  Completed   HPV Vaccine  Aged Out   Colon Cancer Screening  Discontinued  *Topic was postponed. The date shown is not the original due date.     Next appointment: Follow up in one year for your annual wellness visit.   Preventive Care 71 Years and Older, Male Preventive care refers to lifestyle choices and visits with your health care provider that can promote health and wellness. What does preventive care include? A yearly physical exam. This is also called an annual well check. Dental exams once or twice a year. Routine eye exams. Ask your health care provider how often you should have your eyes checked. Personal lifestyle choices, including: Daily care of your teeth and gums. Regular physical activity. Eating a healthy diet. Avoiding tobacco and drug use. Limiting alcohol use. Practicing safe sex. Taking low doses of aspirin every day. Taking vitamin and mineral supplements as recommended by your health care  provider. What happens during an annual well check? The services and screenings done by your health care provider during your annual well check will depend on your age, overall health, lifestyle risk factors, and family history of disease. Counseling  Your health care provider may ask you questions about your: Alcohol use. Tobacco use. Drug use. Emotional well-being. Home and relationship well-being. Sexual activity. Eating habits. History of falls. Memory and ability to understand (cognition). Work and work astronomer. Screening  You may have the following tests or measurements: Height, weight, and BMI. Blood pressure. Lipid and cholesterol levels. These may be checked every 5 years, or more frequently if you are over 68 years old. Skin check. Lung cancer screening. You may have this screening every year starting at age 30 if you have a 30-pack-year history of smoking and currently smoke or have quit within the past 15 years. Fecal occult blood test (FOBT) of the stool. You may have this test every year starting at age 60. Flexible sigmoidoscopy or colonoscopy. You may have a sigmoidoscopy every 5 years or a colonoscopy every 10 years starting at age 57. Prostate cancer screening. Recommendations will vary depending on your family history and other risks. Hepatitis C blood test. Hepatitis B blood test. Sexually transmitted disease (STD) testing. Diabetes screening. This is done by checking your blood sugar (glucose) after you have not eaten for a while (fasting). You may have this done every  1-3 years. Abdominal aortic aneurysm (AAA) screening. You may need this if you are a current or former smoker. Osteoporosis. You may be screened starting at age 69 if you are at high risk. Talk with your health care provider about your test results, treatment options, and if necessary, the need for more tests. Vaccines  Your health care provider may recommend certain vaccines, such  as: Influenza vaccine. This is recommended every year. Tetanus, diphtheria, and acellular pertussis (Tdap, Td) vaccine. You may need a Td booster every 10 years. Zoster vaccine. You may need this after age 55. Pneumococcal 13-valent conjugate (PCV13) vaccine. One dose is recommended after age 71. Pneumococcal polysaccharide (PPSV23) vaccine. One dose is recommended after age 51. Talk to your health care provider about which screenings and vaccines you need and how often you need them. This information is not intended to replace advice given to you by your health care provider. Make sure you discuss any questions you have with your health care provider. Document Released: 10/26/2015 Document Revised: 06/18/2016 Document Reviewed: 07/31/2015 Elsevier Interactive Patient Education  2017 [employer].  Fall Prevention in the Home Falls can cause injuries. They can happen to people of all ages. There are many things you can do to make your home safe and to help prevent falls. What can I do on the outside of my home? Regularly fix the edges of walkways and driveways and fix any cracks. Remove anything that might make you trip as you walk through a door, such as a raised step or threshold. Trim any bushes or trees on the path to your home. Use bright outdoor lighting. Clear any walking paths of anything that might make someone trip, such as rocks or tools. Regularly check to see if handrails are loose or broken. Make sure that both sides of any steps have handrails. Any raised decks and porches should have guardrails on the edges. Have any leaves, snow, or ice cleared regularly. Use sand or salt on walking paths during winter. Clean up any spills in your garage right away. This includes oil or grease spills. What can I do in the bathroom? Use night lights. Install grab bars by the toilet and in the tub and shower. Do not use towel bars as grab bars. Use non-skid mats or decals in the tub or  shower. If you need to sit down in the shower, use a plastic, non-slip stool. Keep the floor dry. Clean up any water that spills on the floor as soon as it happens. Remove soap buildup in the tub or shower regularly. Attach bath mats securely with double-sided non-slip rug tape. Do not have throw rugs and other things on the floor that can make you trip. What can I do in the bedroom? Use night lights. Make sure that you have a light by your bed that is easy to reach. Do not use any sheets or blankets that are too big for your bed. They should not hang down onto the floor. Have a firm chair that has side arms. You can use this for support while you get dressed. Do not have throw rugs and other things on the floor that can make you trip. What can I do in the kitchen? Clean up any spills right away. Avoid walking on wet floors. Keep items that you use a lot in easy-to-reach places. If you need to reach something above you, use a strong step stool that has a grab bar. Keep electrical cords out of the way.  Do not use floor polish or wax that makes floors slippery. If you must use wax, use non-skid floor wax. Do not have throw rugs and other things on the floor that can make you trip. What can I do with my stairs? Do not leave any items on the stairs. Make sure that there are handrails on both sides of the stairs and use them. Fix handrails that are broken or loose. Make sure that handrails are as long as the stairways. Check any carpeting to make sure that it is firmly attached to the stairs. Fix any carpet that is loose or worn. Avoid having throw rugs at the top or bottom of the stairs. If you do have throw rugs, attach them to the floor with carpet tape. Make sure that you have a light switch at the top of the stairs and the bottom of the stairs. If you do not have them, ask someone to add them for you. What else can I do to help prevent falls? Wear shoes that: Do not have high heels. Have  rubber bottoms. Are comfortable and fit you well. Are closed at the toe. Do not wear sandals. If you use a stepladder: Make sure that it is fully opened. Do not climb a closed stepladder. Make sure that both sides of the stepladder are locked into place. Ask someone to hold it for you, if possible. Clearly mark and make sure that you can see: Any grab bars or handrails. First and last steps. Where the edge of each step is. Use tools that help you move around (mobility aids) if they are needed. These include: Canes. Walkers. Scooters. Crutches. Turn on the lights when you go into a dark area. Replace any light bulbs as soon as they burn out. Set up your furniture so you have a clear path. Avoid moving your furniture around. If any of your floors are uneven, fix them. If there are any pets around you, be aware of where they are. Review your medicines with your doctor. Some medicines can make you feel dizzy. This can increase your chance of falling. Ask your doctor what other things that you can do to help prevent falls. This information is not intended to replace advice given to you by your health care provider. Make sure you discuss any questions you have with your health care provider. Document Released: 07/26/2009 Document Revised: 03/06/2016 Document Reviewed: 11/03/2014 Elsevier Interactive Patient Education  2017 [employer].

## 2023-10-13 NOTE — Progress Notes (Signed)
 Subjective:   William Phillips is a 72 y.o. male who presents for Medicare Annual/Subsequent preventive examination.  Visit Complete: Virtual I connected with  William Phillips on 10/13/23 by a audio enabled telemedicine application and verified that I am speaking with the correct person using two identifiers.  Patient Location: Home  Provider Location: Office/Clinic  I discussed the limitations of evaluation and management by telemedicine. The patient expressed understanding and agreed to proceed.  Vital Signs: Because this visit was a virtual/telehealth visit, some criteria may be missing or patient reported. Any vitals not documented were not able to be obtained and vitals that have been documented are patient reported.  Patient Medicare AWV questionnaire was completed by the patient on 10/11/23; I have confirmed that all information answered by patient is correct and no changes since this date.  Cardiac Risk Factors include: advanced age (>55men, >68 women);male gender;diabetes mellitus;dyslipidemia;hypertension     Objective:    There were no vitals filed for this visit. There is no height or weight on file to calculate BMI.     10/13/2023    8:27 AM 09/28/2023    8:44 AM 08/24/2023   11:41 AM 08/24/2023   11:26 AM 07/29/2023   11:43 AM 07/06/2023    8:03 PM 06/28/2023    8:01 AM  Advanced Directives  Does Patient Have a Medical Advance Directive? Yes Yes Yes Yes Yes No No  Type of Estate Agent of [place];Living will Living will;Healthcare Power of [employer] Power of [place];Living will Healthcare Power of [place];Living will Living will;Healthcare Power of Attorney    Does patient want to make changes to medical advance directive? No - Patient declined No - Patient declined No - Patient declined No - Patient declined     Copy of Healthcare Power of Attorney in Chart? No - copy requested          Current Medications  (verified) Outpatient Encounter Medications as of 10/13/2023  Medication Sig   amLODipine  (NORVASC ) 10 MG tablet TAKE ONE-HALF TABLET BY MOUTH  TWICE DAILY   apixaban  (ELIQUIS ) 5 MG TABS tablet TAKE 1 TABLET BY MOUTH TWICE  DAILY   Ascorbic Acid (VITAMIN C PO) Take 1 tablet by mouth daily.   Brimonidine Tartrate (LUMIFY) 0.025 % SOLN Place 1 drop into both eyes every morning.   Calcium  Carbonate-Vit D-Min (CALCIUM  1200 PO) Take 1,200 mg by mouth daily.   Carboxymethylcellulose Sodium (LUBRICANT EYE DROPS OP) Place 1 drop into both eyes 2 (two) times daily as needed (Dry eye).   Clobetasol Propionate 0.05 % shampoo Apply 1 application topically daily as needed (irritation).   clotrimazole  (CLOTRIMAZOLE  ANTI-FUNGAL) 1 % cream Apply 1 Application topically 2 (two) times daily.   darolutamide  (NUBEQA ) 300 MG tablet Take 2 tablets (600 mg total) by mouth 2 (two) times daily with a meal.   fexofenadine  (ALLEGRA ) 180 MG tablet Take 1 tablet (180 mg total) by mouth daily.   fluticasone  (FLONASE ) 50 MCG/ACT nasal spray Place 2 sprays into both nostrils daily as needed for allergies or rhinitis.   ketoconazole (NIZORAL) 2 % shampoo Apply 1 application topically daily as needed for irritation.   lisinopril -hydrochlorothiazide  (ZESTORETIC ) 20-12.5 MG tablet Take 1 tablet by mouth daily.   metoprolol  succinate (TOPROL -XL) 50 MG 24 hr tablet TAKE 1 TABLET BY MOUTH TWICE  DAILY WITH OR IMMEDIATELY  FOLLOWING A MEAL   Multiple Vitamin (MULTIVITAMIN) tablet Take 1 tablet by mouth every evening.   nitrofurantoin , macrocrystal-monohydrate, (MACROBID ) 100  MG capsule Take 1 capsule (100 mg total) by mouth 2 (two) times daily.   omeprazole  (PRILOSEC) 20 MG capsule TAKE 1 CAPSULE BY MOUTH DAILY   oxyCODONE -acetaminophen  (PERCOCET) 5-325 MG tablet Take 1 tablet by mouth every 6 (six) hours as needed.   rosuvastatin  (CRESTOR ) 5 MG tablet Take 1 tablet (5 mg total) by mouth daily.   sildenafil  (REVATIO ) 20 MG tablet  TAKE 1 TO 4 TABLETS BY MOUTH DAILY AS NEEDED   silodosin  (RAPAFLO ) 8 MG CAPS capsule Take 1 capsule (8 mg total) by mouth daily with breakfast.   [EXPIRED] sulfamethoxazole -trimethoprim  (BACTRIM  DS) 800-160 MG tablet Take 1 tablet by mouth 2 (two) times daily for 10 days.   VITAMIN D  PO Take 1 capsule by mouth daily. 1000 units daily.   No facility-administered encounter medications on file as of 10/13/2023.    Allergies (verified) Penicillins   History: Past Medical History:  Diagnosis Date   Allergic rhinitis    Allergy 1953   Ankle fracture 1998   Asthma 1953   Atrial flutter (HCC)    a. s/p CTI ablation by Dr Kelsie 11/2014   Back pain 08/05/2015   BPH (benign prostatic hyperplasia) 08/26/2015   Cancer (HCC) 0630/2024   Prostate Cancer   Ganglion cyst of dorsum of right wrist 04/08/2017   GERD (gastroesophageal reflux disease)    Hammer toe of right foot 05/06/2013   History of carpal tunnel syndrome    Hyperlipidemia    Hypertension    Insect bite 04/23/2015   OA (osteoarthritis) 02/06/2013   knees   Pedal edema 04/08/2017   Preventative health care 04/27/2011   Had normal colonoscopy in 2011    Past Surgical History:  Procedure Laterality Date   A-FLUTTER ABLATION N/A 09/12/2021   Procedure: A-FLUTTER ABLATION;  Surgeon: Kelsie Agent, MD;  Location: [facility] INVASIVE CV LAB;  Service: Cardiovascular;  Laterality: N/A;   ABLATION  11/24/2014   CTI ablation by Dr Kelsie   ANKLE SURGERY     ATRIAL FIBRILLATION ABLATION N/A 09/02/2022   Procedure: ATRIAL FIBRILLATION ABLATION;  Surgeon: Nancey Eulas BRAVO, MD;  Location: [facility] INVASIVE CV LAB;  Service: Cardiovascular;  Laterality: N/A;   ATRIAL FLUTTER ABLATION N/A 11/24/2014   Procedure: ATRIAL FLUTTER ABLATION;  Surgeon: Agent Kelsie, MD;  Location: [facility] CATH LAB;  Service: Cardiovascular;  Laterality: N/A;   CARDIOVERSION N/A 07/14/2022   Procedure: CARDIOVERSION;  Surgeon: Mona William BROCKS, MD;  Location: [facility] ENDOSCOPY;   Service: Cardiovascular;  Laterality: N/A;   Carpel tunnel     COLONOSCOPY  08/04/2002   Normal Exam- Dr Abran   EYE SURGERY  1990   Radial Keratotomy   FRACTURE SURGERY  1998   Broken ankle   HERNIA REPAIR  2008   INGUINAL HERNIA REPAIR  10/13/2006   TONSILLECTOMY     Family History  Problem Relation Age of Onset   Arthritis Mother    Stroke Mother    Coronary artery disease Father        aortic valve disease   Heart attack Father 67   COPD Father    Hypertension Father    Hyperlipidemia Father    Dementia Father    Cancer Sister 74       breast cancer   Diabetes Sister    Diabetes type II Maternal Grandfather    Diabetes Maternal Grandfather    Hyperlipidemia Other    Hypertension Other    Social History   Socioeconomic History   Marital status: Married  Spouse name: Not on file   Number of children: 1   Years of education: Not on file   Highest education level: Not on file  Occupational History    Comment: Sales  Tobacco Use   Smoking status: Former    Current packs/day: 0.00    Average packs/day: 1 pack/day for 10.0 years (10.0 ttl pk-yrs)    Types: Cigarettes    Start date: 31    Quit date: 1989    Years since quitting: 36.0   Smokeless tobacco: Never   Tobacco comments:    5-10 pack year history  Vaping Use   Vaping status: Never Used  Substance and Sexual Activity   Alcohol use: Yes    Alcohol/week: 10.0 standard drinks of alcohol    Types: 10 Glasses of wine per week    Comment: 1-2 drinks daily (wine)   Drug use: No   Sexual activity: Yes  Other Topics Concern   Not on file  Social History Narrative   Occupation: Airline Pilot  Architect- chemicals, seed, erosion control)   Married- 35 years   21 daughter -holiday representative in   ([place]   Previous smoker - 5-10 pack yrs     Alcohol use-yes (2-3 glasses of wine)     Social Drivers of Corporate Investment Banker Strain: Low Risk  (10/13/2023)   Overall Financial Resource Strain  (CARDIA)    Difficulty of Paying Living Expenses: Not hard at all  Food Insecurity: No Food Insecurity (10/13/2023)   Hunger Vital Sign    Worried About Running Out of Food in the Last Year: Never true    Ran Out of Food in the Last Year: Never true  Transportation Needs: No Transportation Needs (10/13/2023)   PRAPARE - Administrator, Civil Service (Medical): No    Lack of Transportation (Non-Medical): No  Physical Activity: Sufficiently Active (10/13/2023)   Exercise Vital Sign    Days of Exercise per Week: 5 days    Minutes of Exercise per Session: 50 min  Stress: No Stress Concern Present (10/13/2023)   [employer] of Occupational Health - Occupational Stress Questionnaire    Feeling of Stress : Not at all  Social Connections: Socially Integrated (10/13/2023)   Social Connection and Isolation Panel [NHANES]    Frequency of Communication with Friends and Family: More than three times a week    Frequency of Social Gatherings with Friends and Family: Three times a week    Attends Religious Services: More than 4 times per year    Active Member of Clubs or Organizations: Yes    Attends Engineer, Structural: More than 4 times per year    Marital Status: Married    Tobacco Counseling Counseling given: Not Answered Tobacco comments: 5-10 pack year history   Clinical Intake:  Pre-visit preparation completed: Yes  Pain : No/denies pain  Nutritional Risks: None Diabetes: Yes CBG done?: No Did pt. bring in CBG monitor from home?: No  How often do you need to have someone help you when you read instructions, pamphlets, or other written materials from your doctor or pharmacy?: 1 - Never  Interpreter Needed?: No  Information entered by :: Jeoffrey Lease, CMA   Activities of Daily Living    10/11/2023    4:49 PM  In your present state of health, do you have any difficulty performing the following activities:  Hearing? 0  Vision? 0  Difficulty  concentrating or making decisions? 0  Walking  or climbing stairs? 0  Dressing or bathing? 0  Doing errands, shopping? 0  Preparing Food and eating ? N  Using the Toilet? N  In the past six months, have you accidently leaked urine? Y  Do you have problems with loss of bowel control? N  Managing your Medications? N  Managing your Finances? N  Housekeeping or managing your Housekeeping? N    Patient Care Team: Domenica Harlene LABOR, MD as PCP - General (Family Medicine) Mealor, Eulas BRAVO, MD as PCP - Electrophysiology (Cardiology) Pietro Redell RAMAN, MD as PCP - Cardiology (Cardiology) Timmy Maude SAUNDERS, MD as Medical Oncologist (Oncology) Randine Warren BRAVO, RN as Oncology Nurse Navigator Patrcia Sharper, MD as Consulting Physician (Ophthalmology)  Indicate any recent Medical Services you may have received from other than [facility] providers in the past year (date may be approximate).     Assessment:   This is a routine wellness examination for [place].  Hearing/Vision screen No results found.   Goals Addressed   None    Depression Screen    10/13/2023    8:32 AM 06/18/2023    9:55 AM 12/16/2022    9:35 AM 10/10/2022    8:23 AM 06/17/2022   10:55 AM 10/01/2021    8:29 AM 04/29/2021   10:59 AM  PHQ 2/9 Scores  PHQ - 2 Score 0 0 0 0 0 0 0  PHQ- 9 Score   0  0  0    Fall Risk    10/11/2023    4:49 PM 06/18/2023    9:55 AM 12/16/2022    9:35 AM 10/07/2022    2:59 PM 06/17/2022   10:55 AM  Fall Risk   Falls in the past year? 0 0 0 0 0  Number falls in past yr: 0 0 0 0 0  Injury with Fall? 0 0 0 0 0  Risk for fall due to : No Fall Risks   No Fall Risks   Follow up Falls evaluation completed Falls evaluation completed Falls evaluation completed Falls evaluation completed     MEDICARE RISK AT HOME: Medicare Risk at Home Any stairs in or around the home?: Yes If so, are there any without handrails?: No Home free of loose throw rugs in walkways, pet beds, electrical cords, etc?:  Yes Adequate lighting in your home to reduce risk of falls?: Yes Life alert?: No Use of a cane, walker or w/c?: No Grab bars in the bathroom?: No Elevated toilet seat or a handicapped toilet?: No  TIMED UP AND GO:  Was the test performed?  No    Cognitive Function:        10/13/2023    8:33 AM 10/10/2022    8:27 AM  6CIT Screen  What Year? 0 points 0 points  What month? 0 points 0 points  What time? 0 points 0 points  Count back from 20 0 points 0 points  Months in reverse 0 points 0 points  Repeat phrase 0 points 0 points  Total Score 0 points 0 points    Immunizations Immunization History  Administered Date(s) Administered   Fluad Quad(high Dose 65+) 06/29/2019   Influenza Whole 07/25/2009   Influenza, High Dose Seasonal PF 07/29/2017   Influenza,inj,Quad PF,6+ Mos 07/24/2015, 07/18/2016, 06/29/2019, 10/11/2020   Influenza-Unspecified 08/13/2018   Moderna SARS-COV2 Booster Vaccination 10/23/2020   Moderna Sars-Covid-2 Vaccination 01/01/2020, 01/29/2020   PNEUMOCOCCAL CONJUGATE-20 12/16/2022   Pneumococcal Conjugate-13 11/02/2013   Pneumococcal Polysaccharide-23 11/05/2017   Td 01/07/2005  Tdap 08/24/2015   Zoster Recombinant(Shingrix) 10/11/2020, 12/27/2020   Zoster, Live 02/04/2013    TDAP status: Up to date  Flu Vaccine status: Due, Education has been provided regarding the importance of this vaccine. Advised may receive this vaccine at local pharmacy or Health Dept. Aware to provide a copy of the vaccination record if obtained from local pharmacy or Health Dept. Verbalized acceptance and understanding.  Pneumococcal vaccine status: Up to date  Covid-19 vaccine status: Information provided on how to obtain vaccines.   Qualifies for Shingles Vaccine? Yes   Zostavax completed Yes   Shingrix Completed?: Yes  Screening Tests Health Maintenance  Topic Date Due   FOOT EXAM  Never done   OPHTHALMOLOGY EXAM  Never done   Diabetic kidney evaluation - Urine  ACR  10/18/2016   COVID-19 Vaccine (3 - Moderna risk series) 11/20/2020   Medicare Annual Wellness (AWV)  10/11/2023   INFLUENZA VACCINE  01/11/2024 (Originally 05/14/2023)   HEMOGLOBIN A1C  12/16/2023   Diabetic kidney evaluation - eGFR measurement  09/27/2024   Fecal DNA (Cologuard)  10/16/2024   DTaP/Tdap/Td (3 - Td or Tdap) 08/23/2025   Pneumonia Vaccine 43+ Years old  Completed   Hepatitis C Screening  Completed   Zoster Vaccines- Shingrix  Completed   HPV VACCINES  Aged Out   Colonoscopy  Discontinued    Health Maintenance  Health Maintenance Due  Topic Date Due   FOOT EXAM  Never done   OPHTHALMOLOGY EXAM  Never done   Diabetic kidney evaluation - Urine ACR  10/18/2016   COVID-19 Vaccine (3 - Moderna risk series) 11/20/2020   Medicare Annual Wellness (AWV)  10/11/2023    Colorectal cancer screening: Type of screening: Cologuard. Completed 10/16/21. Repeat every 3 years  Lung Cancer Screening: (Low Dose CT Chest recommended if Age 35-80 years, 20 pack-year currently smoking OR have quit w/in 15years.) does not qualify.    Additional Screening:  Hepatitis C Screening: does qualify; Completed 05/26/16  Vision Screening: Recommended annual ophthalmology exams for early detection of glaucoma and other disorders of the eye. Is the patient up to date with their annual eye exam?  Yes  Who is the provider or what is the name of the office in which the patient attends annual eye exams? Dr. Patrcia If pt is not established with a provider, would they like to be referred to a provider to establish care? No .   Dental Screening: Recommended annual dental exams for proper oral hygiene  Diabetic Foot Exam: Diabetic Foot Exam: Overdue, Pt has been advised about the importance in completing this exam. Pt is scheduled for diabetic foot exam on N/a.  Community Resource Referral / Chronic Care Management: CRR required this visit?  No   CCM required this visit?  No     Plan:     I  have personally reviewed and noted the following in the patient's chart:   Medical and social history Use of alcohol, tobacco or illicit drugs  Current medications and supplements including opioid prescriptions. Patient is currently taking opioid prescriptions. Information provided to patient regarding non-opioid alternatives. Patient advised to discuss non-opioid treatment plan with their provider. Functional ability and status Nutritional status Physical activity Advanced directives List of other physicians Hospitalizations, surgeries, and ER visits in previous 12 months Vitals Screenings to include cognitive, depression, and falls Referrals and appointments  In addition, I have reviewed and discussed with patient certain preventive protocols, quality metrics, and best practice recommendations. A written personalized care  plan for preventive services as well as general preventive health recommendations were provided to patient.     Kandis Gauze, CMA   10/13/2023   After Visit Summary: (MyChart) Due to this being a telephonic visit, the after visit summary with patients personalized plan was offered to patient via MyChart   Nurse Notes: None

## 2023-10-14 LAB — URINE CULTURE
MICRO NUMBER:: 15905420
Result:: NO GROWTH
SPECIMEN QUALITY:: ADEQUATE

## 2023-10-15 ENCOUNTER — Encounter: Payer: Self-pay | Admitting: Family Medicine

## 2023-10-16 ENCOUNTER — Other Ambulatory Visit: Payer: Self-pay | Admitting: Urology

## 2023-10-16 DIAGNOSIS — N138 Other obstructive and reflux uropathy: Secondary | ICD-10-CM

## 2023-10-19 ENCOUNTER — Other Ambulatory Visit: Payer: Self-pay | Admitting: Family Medicine

## 2023-10-23 ENCOUNTER — Encounter: Payer: Self-pay | Admitting: *Deleted

## 2023-10-26 ENCOUNTER — Encounter: Payer: Self-pay | Admitting: *Deleted

## 2023-10-26 NOTE — Progress Notes (Signed)
 Reviewed PET which shows great treatment response.   Oncology Nurse Navigator Documentation     10/26/2023    7:30 AM  Oncology Nurse Navigator Flowsheets  Navigator Follow Up Date: 11/02/2023  Navigator Follow Up Reason: Follow-up Appointment  Navigator Location [facility]  Navigator Encounter Type Scan Review  Patient Visit Type MedOnc  Treatment Phase Active Tx  Barriers/Navigation Needs Coordination of Care;Education  Interventions None Required  Acuity Level 2-Minimal Needs (1-2 Barriers Identified)  Support Groups/Services Friends and Family  Time Spent with Patient 15

## 2023-10-27 ENCOUNTER — Other Ambulatory Visit: Payer: Self-pay | Admitting: Family Medicine

## 2023-11-02 ENCOUNTER — Inpatient Hospital Stay: Payer: Medicare Other | Attending: Hematology & Oncology

## 2023-11-02 ENCOUNTER — Other Ambulatory Visit: Payer: Self-pay

## 2023-11-02 ENCOUNTER — Encounter: Payer: Self-pay | Admitting: Hematology & Oncology

## 2023-11-02 ENCOUNTER — Inpatient Hospital Stay (HOSPITAL_BASED_OUTPATIENT_CLINIC_OR_DEPARTMENT_OTHER): Payer: Medicare Other | Admitting: Hematology & Oncology

## 2023-11-02 VITALS — BP 114/56 | HR 77 | Temp 98.2°F | Resp 18 | Ht 72.0 in | Wt 212.0 lb

## 2023-11-02 DIAGNOSIS — C61 Malignant neoplasm of prostate: Secondary | ICD-10-CM

## 2023-11-02 DIAGNOSIS — C7951 Secondary malignant neoplasm of bone: Secondary | ICD-10-CM

## 2023-11-02 LAB — CMP (CANCER CENTER ONLY)
ALT: 40 U/L (ref 0–44)
AST: 45 U/L — ABNORMAL HIGH (ref 15–41)
Albumin: 4.4 g/dL (ref 3.5–5.0)
Alkaline Phosphatase: 55 U/L (ref 38–126)
Anion gap: 8 (ref 5–15)
BUN: 14 mg/dL (ref 8–23)
CO2: 30 mmol/L (ref 22–32)
Calcium: 9.4 mg/dL (ref 8.9–10.3)
Chloride: 102 mmol/L (ref 98–111)
Creatinine: 0.86 mg/dL (ref 0.61–1.24)
GFR, Estimated: >60 mL/min (ref >60)
Glucose, Bld: 197 mg/dL — ABNORMAL HIGH (ref 70–99)
Potassium: 5.2 mmol/L — ABNORMAL HIGH (ref 3.5–5.1)
Sodium: 140 mmol/L (ref 135–145)
Total Bilirubin: 0.7 mg/dL (ref 0.0–1.2)
Total Protein: 7 g/dL (ref 6.5–8.1)

## 2023-11-02 LAB — CBC WITH DIFFERENTIAL (CANCER CENTER ONLY)
Abs Immature Granulocytes: 0.02 10*3/uL (ref 0.00–0.07)
Basophils Absolute: 0 10*3/uL (ref 0.0–0.1)
Basophils Relative: 1 %
Eosinophils Absolute: 0.3 10*3/uL (ref 0.0–0.5)
Eosinophils Relative: 5 %
HCT: 39 % (ref 39.0–52.0)
Hemoglobin: 13.2 g/dL (ref 13.0–17.0)
Immature Granulocytes: 0 %
Lymphocytes Relative: 31 %
Lymphs Abs: 1.8 10*3/uL (ref 0.7–4.0)
MCH: 32.8 pg (ref 26.0–34.0)
MCHC: 33.8 g/dL (ref 30.0–36.0)
MCV: 97 fL (ref 80.0–100.0)
Monocytes Absolute: 0.7 10*3/uL (ref 0.1–1.0)
Monocytes Relative: 12 %
Neutro Abs: 2.9 10*3/uL (ref 1.7–7.7)
Neutrophils Relative %: 51 %
Platelet Count: 201 10*3/uL (ref 150–400)
RBC: 4.02 MIL/uL — ABNORMAL LOW (ref 4.22–5.81)
RDW: 13.3 % (ref 11.5–15.5)
WBC Count: 5.7 10*3/uL (ref 4.0–10.5)
nRBC: 0 % (ref 0.0–0.2)

## 2023-11-02 NOTE — Progress Notes (Signed)
Hematology and Oncology Follow Up Visit  William Phillips [id number] 08-14-1951 73 y.o. 11/02/2023   Principle Diagnosis:  Metastatic prostate cancer-castrate sensitive-oligometastatic  Current Therapy:   Eligard 45 mg IM every 6 months next dose 03/2024 Nubeqa 600 mg p.o. twice daily-started on 07/30/2023 Zometa 4 mg IV every 3 months -started on 12/2023     Interim History:  [name] is back for follow-up.  He looks quite good.  He feels okay.  He had a bit PET scan that was done on 10/12/2023.  This showed no activity in the prostate.  He had no nodal metastasis.  There is no visceral metastasis.  He has stable disease at L1 and right iliac wing.  I spoke with Dr. Roselind Messier of Radiation Oncology.  He will see [name] and see about some palliative radiotherapy.  His last PSA was I think nondetectable.  His last testosterone was quite low.  He does have some fatigue.  I am sure this is probably from his low testosterone.  He is on [place]  He is doing well with this.  He has had no cough or shortness of breath.  He has had no problems with COVID.  He has had no rashes.  There is been no leg swelling.  Overall, I would say that his performance status is probably ECOG 1.    Medications:  Current Outpatient Medications:    amLODipine (NORVASC) 10 MG tablet, TAKE ONE-HALF TABLET BY MOUTH  TWICE DAILY, Disp: 100 tablet, Rfl: 2   apixaban (ELIQUIS) 5 MG TABS tablet, TAKE 1 TABLET BY MOUTH TWICE  DAILY, Disp: 200 tablet, Rfl: 1   Ascorbic Acid (VITAMIN C PO), Take 1 tablet by mouth daily., Disp: , Rfl:    Brimonidine Tartrate (LUMIFY) 0.025 % SOLN, Place 1 drop into both eyes every morning., Disp: , Rfl:    Calcium Carbonate-Vit D-Min (CALCIUM 1200 PO), Take 1,200 mg by mouth daily., Disp: , Rfl:    Carboxymethylcellulose Sodium (LUBRICANT EYE DROPS OP), Place 1 drop into both eyes 2 (two) times daily as needed (Dry eye)., Disp: , Rfl:    Clobetasol Propionate 0.05 % shampoo, Apply  1 application topically daily as needed (irritation)., Disp: , Rfl:    clotrimazole (CLOTRIMAZOLE ANTI-FUNGAL) 1 % cream, Apply 1 Application topically 2 (two) times daily., Disp: 30 g, Rfl: 0   darolutamide (NUBEQA) 300 MG tablet, Take 2 tablets (600 mg total) by mouth 2 (two) times daily with a meal., Disp: 120 tablet, Rfl: 0   fexofenadine (ALLEGRA) 180 MG tablet, Take 1 tablet (180 mg total) by mouth daily., Disp: , Rfl:    fluticasone (FLONASE) 50 MCG/ACT nasal spray, Place 2 sprays into both nostrils daily as needed for allergies or rhinitis., Disp: , Rfl: 2   ketoconazole (NIZORAL) 2 % shampoo, Apply 1 application topically daily as needed for irritation., Disp: , Rfl:    lisinopril-hydrochlorothiazide (ZESTORETIC) 20-12.5 MG tablet, TAKE 1 TABLET BY MOUTH DAILY, Disp: 100 tablet, Rfl: 2   metoprolol succinate (TOPROL-XL) 50 MG 24 hr tablet, TAKE 1 TABLET BY MOUTH TWICE  DAILY WITH OR IMMEDIATELY  FOLLOWING A MEAL, Disp: 200 tablet, Rfl: 2   Multiple Vitamin (MULTIVITAMIN) tablet, Take 1 tablet by mouth every evening., Disp: , Rfl:    nitrofurantoin, macrocrystal-monohydrate, (MACROBID) 100 MG capsule, Take 1 capsule (100 mg total) by mouth 2 (two) times daily., Disp: 10 capsule, Rfl: 0   omeprazole (PRILOSEC) 20 MG capsule, TAKE 1 CAPSULE BY MOUTH DAILY, Disp: 100  capsule, Rfl: 2   oxyCODONE-acetaminophen (PERCOCET) 5-325 MG tablet, Take 1 tablet by mouth every 6 (six) hours as needed., Disp: 10 tablet, Rfl: 0   rosuvastatin (CRESTOR) 5 MG tablet, Take 1 tablet (5 mg total) by mouth daily., Disp: 30 tablet, Rfl: 5   sildenafil (REVATIO) 20 MG tablet, TAKE 1 TO 4 TABLETS BY MOUTH DAILY AS NEEDED, Disp: 90 tablet, Rfl: 0   silodosin (RAPAFLO) 8 MG CAPS capsule, Take 1 capsule (8 mg total) by mouth daily with breakfast., Disp: 90 capsule, Rfl: 3   VITAMIN D PO, Take 1 capsule by mouth daily. 1000 units daily., Disp: , Rfl:   Allergies:  Allergies  Allergen Reactions   Penicillins Swelling     irriation    Past Medical History, Surgical history, Social history, and Family History were reviewed and updated.  Review of Systems: Review of Systems  Constitutional: Negative.   HENT:  Negative.    Eyes: Negative.   Respiratory: Negative.    Cardiovascular: Negative.   Gastrointestinal: Negative.   Endocrine: Negative.   Musculoskeletal:  Positive for back pain and flank pain.  Neurological: Negative.   Hematological: Negative.   Psychiatric/Behavioral: Negative.      Physical Exam: Temperature 98.2.  Pulse 77.  Blood pressure 114/56.  Weight is 212 pounds.    Wt Readings from Last 3 Encounters:  11/02/23 212 lb (96.2 kg)  09/28/23 210 lb (95.3 kg)  09/23/23 210 lb (95.3 kg)    Physical Exam Vitals reviewed.  HENT:     Head: Normocephalic and atraumatic.  Eyes:     Pupils: Pupils are equal, round, and reactive to light.  Cardiovascular:     Rate and Rhythm: Normal rate and regular rhythm.     Heart sounds: Normal heart sounds.  Pulmonary:     Effort: Pulmonary effort is normal.     Breath sounds: Normal breath sounds.  Abdominal:     General: Bowel sounds are normal.     Palpations: Abdomen is soft.  Musculoskeletal:        General: No tenderness or deformity. Normal range of motion.     Cervical back: Normal range of motion.  Lymphadenopathy:     Cervical: No cervical adenopathy.  Skin:    General: Skin is warm and dry.     Findings: No erythema or rash.  Neurological:     Mental Status: He is alert and oriented to person, place, and time.  Psychiatric:        Behavior: Behavior normal.        Thought Content: Thought content normal.        Judgment: Judgment normal.     Lab Results  Component Value Date   WBC 5.7 11/02/2023   HGB 13.2 11/02/2023   HCT 39.0 11/02/2023   MCV 97.0 11/02/2023   PLT 201 11/02/2023     Chemistry      Component Value Date/Time   NA 140 11/02/2023 1013   NA 139 08/05/2022 1606   K 5.2 (H) 11/02/2023 1013   CL  102 11/02/2023 1013   CO2 30 11/02/2023 1013   BUN 14 11/02/2023 1013   BUN 10 08/05/2022 1606   CREATININE 0.86 11/02/2023 1013   CREATININE 0.77 11/29/2014 1512      Component Value Date/Time   CALCIUM 9.4 11/02/2023 1013   ALKPHOS 55 11/02/2023 1013   AST 45 (H) 11/02/2023 1013   ALT 40 11/02/2023 1013   BILITOT 0.7 11/02/2023 1013  Impression and Plan: Mr. Warchol is a very nice 73 year old white male.  He has metastatic prostate cancer.  He is on antiandrogen therapy right now.  I feel confident that everything is working well.  Again, the 2 areas of bone involvement do not appear to be all that bad.  I know that radiotherapy will help with this.  We will plan to get him back to see me in about 6 weeks.  I would think that at that point time, he should be finished with radiotherapy.  I probably would not plan for another PET scan probably until late March or early April.     Josph Macho, MD 1/20/202511:24 AM

## 2023-11-03 ENCOUNTER — Encounter: Payer: Self-pay | Admitting: *Deleted

## 2023-11-03 LAB — TESTOSTERONE: Testosterone: 10 ng/dL — ABNORMAL LOW (ref 264–916)

## 2023-11-03 LAB — PSA, TOTAL AND FREE
PSA, Free Pct: UNDETERMINED %
PSA, Free: <0.02 ng/mL
Prostate Specific Ag, Serum: <0.1 ng/mL (ref 0.0–4.0)

## 2023-11-03 NOTE — Progress Notes (Signed)
Histology and Location of Primary Cancer:  etastatic prostate cancer-castrate sensitive-oligometastatic   Sites of Visceral and Bony Metastatic Disease:  L1 and right iliac wing   Past/Anticipated chemotherapy by medical oncology, if any:  Dr. Myna Hidalgo   Pain on a scale of 0-10 is: {Number; 1-10  not applicable:20727}    If Spine Met(s), symptoms, if any, include: Bowel/Bladder retention or incontinence (please describe): *** Numbness or weakness in extremities (please describe): *** Current Decadron regimen, if applicable: ***  Ambulatory status? Walker? Wheelchair?: {VQI Ambulatory Status:20974}  SAFETY ISSUES: Prior radiation? {:18581} Pacemaker/ICD? {:18581} Possible current pregnancy? no Is the patient on methotrexate? {:18581}  Current Complaints / other details:  ***

## 2023-11-03 NOTE — Progress Notes (Signed)
PET Shows good treatment response. He does have some bone involvement and will see RadOnc tomorrow for consideration of radiation.   Oncology Nurse Navigator Documentation     11/03/2023    8:15 AM  Oncology Nurse Navigator Flowsheets  Navigator Follow Up Date: 12/14/2023  Navigator Follow Up Reason: Follow-up Appointment  Navigator Location [facility]  Navigator Encounter Type Appt/Treatment Plan Review  Patient Visit Type MedOnc  Treatment Phase Active Tx  Barriers/Navigation Needs No Barriers At This Time  Interventions None Required  Acuity Level 1-No Barriers  Support Groups/Services Friends and Family  Time Spent with Patient 15

## 2023-11-03 NOTE — Progress Notes (Signed)
Radiation Oncology         (336) [phone] ________________________________  Initial {In/Out}patient Consultation  Name: William Phillips MRN: [id number]  Date: 11/04/2023  DOB: Aug 18, 1951  JY:NWGNF, Bryon Lions, MD  Josph Macho, MD   REFERRING PHYSICIAN: Josph Macho, MD  DIAGNOSIS: There were no encounter diagnoses. Metastatic prostate cancer-castrate sensitive-oligometastatic   Prostate cancer (HCC); Stage IVB (cT2b, cN0, pM1, PSA: 5.3, Grade Group: 3) unfavorable intermediate risk.   HISTORY OF PRESENT ILLNESS::William Phillips is a 73 y.o. male who is accompanied by ***. he is seen as a courtesy of Dr. Myna Hidalgo for an opinion concerning radiation therapy as part of management for his recently diagnosed prostate cancer. The patient presented for an MRI on 05-28-23 following experiencing sympotms of  BPH with obstruction of lower urinary tract, nocturia, incomplete bladder emptying and urinary retention along with elevated PSA levels.   MRI on 05-28-23 revealed two PI-RADS category 4 lesions. First lesion is present in the right peripheral zone measuring 0.92 cc at the greatest extend. Second lesion is present in the left peripheral zones measuring 0.18 cc in the greatest extend. Mild prostatomegaly and benign prostatic hypertrophy was also indicated on scan. Lastly, scan showed trace left trochanteric bursitis.   During a follow up with Dr. Pete Glatter on 07-02-23, a PSMA PET scan was recommended for additional staging evaluation. PET on 07-13-23 showed tracer avid prostatic primaries with multifocal osseous metastasis. Right-sided with tracer affinity at a S.U.V. max of 7.1. Left-sided with tracer  affinity at a S.U.V. max of 5.1. scan also showed activity in the bones along with multifocal uptake. He had left iliac wing, right iliac wing, and L1 vertebral body. No evidence of tracer avid soft tissue metastasis was indicated.    A restaging PSMA PET on 10-12-23 showed no activity in the  prostate. No evidence of nodal or visceral metastasis. Scan did however show stable disease at L1 with SUV max equal 12.8 and right iliac wing with SUV max equal 8.5 .   Current therapy: Eligard 45 mg IM every 6 months next dose 03/2024; Nubeqa 600 mg p.o. twice daily-started on 07/30/2023; Zometa 4 mg IV every 3 months-started on 12/2023   The patient underwent a fusion guided biopsy on 06-23-23 showing: Prostate volume measured approximately 86 g. Biopsy results: Gleason 3+4, Gleason 3+3 adenocarcinoma in 3/7 cores on left and 6/7 cores on right.  Both regions of interest were positive. (report not available, impression from Dr. Pete Glatter note on 07-02-23)  PREVIOUS RADIATION THERAPY: No  PAST MEDICAL HISTORY:  Past Medical History:  Diagnosis Date   Allergic rhinitis    Allergy 1953   Ankle fracture 1998   Asthma 1953   Atrial flutter (HCC)    a. s/p CTI ablation by Dr Johney Frame 11/2014   Back pain 08/05/2015   BPH (benign prostatic hyperplasia) 08/26/2015   Cancer (HCC) 0630/2024   Prostate Cancer   Ganglion cyst of dorsum of right wrist 04/08/2017   GERD (gastroesophageal reflux disease)    Hammer toe of right foot 05/06/2013   History of carpal tunnel syndrome    Hyperlipidemia    Hypertension    Insect bite 04/23/2015   OA (osteoarthritis) 02/06/2013   knees   Pedal edema 04/08/2017   Preventative health care 04/27/2011   Had normal colonoscopy in 2011     PAST SURGICAL HISTORY: Past Surgical History:  Procedure Laterality Date   A-FLUTTER ABLATION N/A 09/12/2021   Procedure: A-FLUTTER ABLATION;  Surgeon: Johney Frame,  Fayrene Fearing, MD;  Location: [facility]VAS[facility];  Service: Cardiovascular;  Laterality: N/A;   ABLATION  11/24/2014   CTI ablation by Dr Johney Frame   ANKLE SURGERY     ATRIAL FIBRILLATION ABLATION N/A 09/02/2022   Procedure: ATRIAL FIBRILLATION ABLATION;  Surgeon: Maurice Small, MD;  Location: [facility]VAS[facility] CV [facility];  Service: Cardiovascular;  Laterality: N/A;   ATRIAL  FLUTTER ABLATION N/A 11/24/2014   Procedure: ATRIAL FLUTTER ABLATION;  Surgeon: Hillis Range, MD;  Location: [facility] CATH LAB;  Service: Cardiovascular;  Laterality: N/A;   CARDIOVERSION N/A 07/14/2022   Procedure: CARDIOVERSION;  Surgeon: Chrystie Nose, MD;  Location[facility] ENDOSCOPY;  Service: Cardiovascular;  Laterality: N/A;   Carpel tunnel     COLONOSCOPY  08/04/2002   Normal Exam- Dr [name]   EYE SURGERY  1990   Radial Keratotomy   FRACTURE SURGERY  1998   Broken ankle   HERNIA REPAIR  2008   INGUINAL HERNIA REPAIR  10/13/2006   TONSILLECTOMY      FAMILY HISTORY:  Family History  Problem Relation Age of Onset   Arthritis Mother    Stroke Mother    Coronary artery disease Father        aortic valve disease   Heart attack Father 52   COPD Father    Hypertension Father    Hyperlipidemia Father    Dementia Father    Cancer Sister 55       breast cancer   Diabetes Sister    Diabetes type II Maternal Grandfather    Diabetes Maternal Grandfather    Hyperlipidemia Other    Hypertension Other     SOCIAL HISTORY:  Social History   Tobacco Use   Smoking status: Former    Current packs/day: 0.00    Average packs/day: 1 pack/day for 10.0 years (10.0 ttl pk-yrs)    Types: Cigarettes    Start date: 31    Quit date: 1989    Years since quitting: 36.0   Smokeless tobacco: Never   Tobacco comments:    5-10 pack year history  Vaping Use   Vaping status: Never Used  Substance Use Topics   Alcohol use: Yes    Alcohol/week: 10.0 standard drinks of alcohol    Types: 10 Glasses of wine per week    Comment: 1-2 drinks daily (wine)   Drug use: No    ALLERGIES:  Allergies  Allergen Reactions   Penicillins Swelling    irriation    MEDICATIONS:  Current Outpatient Medications  Medication Sig Dispense Refill   amLODipine (NORVASC) 10 MG tablet TAKE ONE-HALF TABLET BY MOUTH  TWICE DAILY 100 tablet 2   apixaban (ELIQUIS) 5 MG TABS tablet TAKE 1 TABLET BY MOUTH TWICE  DAILY  200 tablet 1   Ascorbic Acid (VITAMIN C PO) Take 1 tablet by mouth daily.     Brimonidine Tartrate (LUMIFY) 0.025 % SOLN Place 1 drop into both eyes every morning.     Calcium Carbonate-Vit D-Min (CALCIUM 1200 PO) Take 1,200 mg by mouth daily.     Carboxymethylcellulose Sodium (LUBRICANT EYE DROPS OP) Place 1 drop into both eyes 2 (two) times daily as needed (Dry eye).     Clobetasol Propionate 0.05 % shampoo Apply 1 application topically daily as needed (irritation).     clotrimazole (CLOTRIMAZOLE ANTI-FUNGAL) 1 % cream Apply 1 Application topically 2 (two) times daily. 30 g 0   darolutamide (NUBEQA) 300 MG tablet Take 2 tablets (600 mg total) by mouth 2 (  two) times daily with a meal. 120 tablet 0   fexofenadine (ALLEGRA) 180 MG tablet Take 1 tablet (180 mg total) by mouth daily.     fluticasone (FLONASE) 50 MCG/ACT nasal spray Place 2 sprays into both nostrils daily as needed for allergies or rhinitis.  2   ketoconazole (NIZORAL) 2 % shampoo Apply 1 application topically daily as needed for irritation.     lisinopril-hydrochlorothiazide (ZESTORETIC) 20-12.5 MG tablet TAKE 1 TABLET BY MOUTH DAILY 100 tablet 2   metoprolol succinate (TOPROL-XL) 50 MG 24 hr tablet TAKE 1 TABLET BY MOUTH TWICE  DAILY WITH OR IMMEDIATELY  FOLLOWING A MEAL 200 tablet 2   Multiple Vitamin (MULTIVITAMIN) tablet Take 1 tablet by mouth every evening.     nitrofurantoin, macrocrystal-monohydrate, (MACROBID) 100 MG capsule Take 1 capsule (100 mg total) by mouth 2 (two) times daily. 10 capsule 0   omeprazole (PRILOSEC) 20 MG capsule TAKE 1 CAPSULE BY MOUTH DAILY 100 capsule 2   oxyCODONE-acetaminophen (PERCOCET) 5-325 MG tablet Take 1 tablet by mouth every 6 (six) hours as needed. 10 tablet 0   rosuvastatin (CRESTOR) 5 MG tablet Take 1 tablet (5 mg total) by mouth daily. 30 tablet 5   sildenafil (REVATIO) 20 MG tablet TAKE 1 TO 4 TABLETS BY MOUTH DAILY AS NEEDED 90 tablet 0   silodosin (RAPAFLO) 8 MG CAPS capsule Take 1  capsule (8 mg total) by mouth daily with breakfast. 90 capsule 3   VITAMIN D PO Take 1 capsule by mouth daily. 1000 units daily.     No current facility-administered medications for this encounter.    REVIEW OF SYSTEMS:  A 10+ POINT REVIEW OF SYSTEMS WAS OBTAINED including neurology, dermatology, psychiatry, cardiac, respiratory, lymph, extremities, GI, GU, musculoskeletal, constitutional, reproductive, HEENT. ***   PHYSICAL EXAM:  vitals were not taken for this visit.   General: Alert and oriented, in no acute distress HEENT: Head is normocephalic. Extraocular movements are intact. Oropharynx is clear. Neck: Neck is supple, no palpable cervical or supraclavicular lymphadenopathy. Heart: Regular in rate and rhythm with no murmurs, rubs, or gallops. Chest: Clear to auscultation bilaterally, with no rhonchi, wheezes, or rales. Abdomen: Soft, nontender, nondistended, with no rigidity or guarding. Extremities: No cyanosis or edema. Lymphatics: see Neck Exam Skin: No concerning lesions. Musculoskeletal: symmetric strength and muscle tone throughout. Neurologic: Cranial nerves II through XII are grossly intact. No obvious focalities. Speech is fluent. Coordination is intact. Psychiatric: Judgment and insight are intact. Affect is appropriate. ***  ECOG = ***  0 - Asymptomatic (Fully active, able to carry on all predisease activities without restriction)  1 - Symptomatic but completely ambulatory (Restricted in physically strenuous activity but ambulatory and able to carry out work of a light or sedentary nature. For example, light housework, office work)  2 - Symptomatic, <50% in bed during the day (Ambulatory and capable of all self care but unable to carry out any work activities. Up and about more than 50% of waking hours)  3 - Symptomatic, >50% in bed, but not bedbound (Capable of only limited self-care, confined to bed or chair 50% or more of waking hours)  4 - Bedbound (Completely  disabled. Cannot carry on any self-care. Totally confined to bed or chair)  5 - Death   Santiago Glad MM, Creech RH, Tormey DC, et al. [phone]). "Toxicity and response criteria of the [facility]". Am. Evlyn Clines. Oncol. 5 (6): 649-55  LABORATORY DATA:  Lab Results  Component Value Date  WBC 5.7 11/02/2023   HGB 13.2 11/02/2023   HCT 39.0 11/02/2023   MCV 97.0 11/02/2023   PLT 201 11/02/2023   NEUTROABS 2.9 11/02/2023   Lab Results  Component Value Date   NA 140 11/02/2023   K 5.2 (H) 11/02/2023   CL 102 11/02/2023   CO2 30 11/02/2023   GLUCOSE 197 (H) 11/02/2023   BUN 14 11/02/2023   CREATININE 0.86 11/02/2023   CALCIUM 9.4 11/02/2023      RADIOGRAPHY: NM PET (PSMA) SKULL TO MID THIGH Result Date: 10/23/2023 CLINICAL DATA:  Prostate cancer with bone metastasis. EXAM: NUCLEAR MEDICINE PET SKULL BASE TO THIGH TECHNIQUE: 8.6 mCi Flotufolastat (Posluma) was injected intravenously. Full-ring PET imaging was performed from the skull base to thigh after the radiotracer. CT data was obtained and used for attenuation correction and anatomic localization. COMPARISON:  PSMA PET scan 07/13/2023 FINDINGS: NECK No radiotracer activity in neck lymph nodes. Incidental CT finding: None. CHEST No radiotracer accumulation within mediastinal or hilar lymph nodes. No suspicious pulmonary nodules on the CT scan. Incidental CT finding: None. ABDOMEN/PELVIS Prostate: Interval resolution of previously seen radiotracer avid lesions in the prostate gland. Lymph nodes: No abnormal radiotracer accumulation within pelvic or abdominal nodes. Liver: No evidence of liver metastasis. Incidental CT finding: None. SKELETON Identical pattern of radiotracer avid skeletal metastasis compared to prior. For example lesion at L1 with SUV max equal 12.8 compared SUV max equal 12.6. No CT correlation. Lesion in the RIGHT iliac wing with SUV max equal 8.5 (image 174) compared SUV max equal 12.0. No new radiotracer avid  skeletal lesions identified. Several more subtle foci of activity within the ribs are indeterminate and unchanged from prior. IMPRESSION: 1. Interval resolution of radiotracer avid lesions in the prostate gland. 2. No evidence of nodal metastasis. 3. No evidence of visceral metastasis. 4. Several radiotracer avid skeletal metastasis are again demonstrated. Near identical pattern and radiotracer activity compared to prior. No CT correlation. Electronically Signed   By: Genevive Bi M.D.   On: 10/23/2023 13:50      IMPRESSION: Metastatic prostate cancer-castrate sensitive-oligometastatic   Prostate cancer (HCC); Stage IVB (cT2b, cN0, pM1, PSA: 5.3, Grade Group: 3) unfavorable intermediate risk.   ***  Today, I talked to the patient and family about the findings and work-up thus far.  We discussed the natural history of *** and general treatment, highlighting the role of radiotherapy in the management.  We discussed the available radiation techniques, and focused on the details of logistics and delivery.  We reviewed the anticipated acute and late sequelae associated with radiation in this setting.  The patient was encouraged to ask questions that I answered to the best of my ability. *** A patient consent form was discussed and signed.  We retained a copy for our records.  The patient would like to proceed with radiation and will be scheduled for CT simulation.  PLAN: ***    *** minutes of total time was spent for this patient encounter, including preparation, face-to-face counseling with the patient and coordination of care, physical exam, and documentation of the encounter.   ------------------------------------------------  Billie Lade, PhD, MD  This document serves as a record of services personally performed by Antony Blackbird, MD. It was created on his behalf by Herbie Saxon, a trained medical scribe. The creation of this record is based on the scribe's personal observations and the  provider's statements to them. This document has been checked and approved by the attending provider.

## 2023-11-04 ENCOUNTER — Ambulatory Visit
Admission: RE | Admit: 2023-11-04 | Discharge: 2023-11-04 | Discharge status: Home / Self Care | Payer: Medicare Other | Source: Ambulatory Visit | Attending: Radiation Oncology | Admitting: Radiation Oncology

## 2023-11-04 ENCOUNTER — Ambulatory Visit
Admission: RE | Admit: 2023-11-04 | Discharge: 2023-11-04 | Disposition: A | Discharge status: Home / Self Care | Payer: Medicare Other | Source: Ambulatory Visit | Attending: Radiation Oncology | Admitting: Radiation Oncology

## 2023-11-04 ENCOUNTER — Encounter: Payer: Self-pay | Admitting: Radiation Oncology

## 2023-11-04 VITALS — BP 124/77 | HR 79 | Temp 97.6°F | Resp 24 | Ht 72.0 in | Wt 212.2 lb

## 2023-11-04 DIAGNOSIS — C7951 Secondary malignant neoplasm of bone: Secondary | ICD-10-CM | POA: Insufficient documentation

## 2023-11-04 DIAGNOSIS — I10 Essential (primary) hypertension: Secondary | ICD-10-CM | POA: Diagnosis not present

## 2023-11-04 DIAGNOSIS — R338 Other retention of urine: Secondary | ICD-10-CM | POA: Diagnosis not present

## 2023-11-04 DIAGNOSIS — Z7901 Long term (current) use of anticoagulants: Secondary | ICD-10-CM | POA: Diagnosis not present

## 2023-11-04 DIAGNOSIS — Z803 Family history of malignant neoplasm of breast: Secondary | ICD-10-CM | POA: Diagnosis not present

## 2023-11-04 DIAGNOSIS — K219 Gastro-esophageal reflux disease without esophagitis: Secondary | ICD-10-CM | POA: Insufficient documentation

## 2023-11-04 DIAGNOSIS — Z79899 Other long term (current) drug therapy: Secondary | ICD-10-CM | POA: Diagnosis not present

## 2023-11-04 DIAGNOSIS — M1990 Unspecified osteoarthritis, unspecified site: Secondary | ICD-10-CM | POA: Diagnosis not present

## 2023-11-04 DIAGNOSIS — Z191 Hormone sensitive malignancy status: Secondary | ICD-10-CM | POA: Diagnosis not present

## 2023-11-04 DIAGNOSIS — E785 Hyperlipidemia, unspecified: Secondary | ICD-10-CM | POA: Diagnosis not present

## 2023-11-04 DIAGNOSIS — Z87891 Personal history of nicotine dependence: Secondary | ICD-10-CM | POA: Insufficient documentation

## 2023-11-04 DIAGNOSIS — Z8042 Family history of malignant neoplasm of prostate: Secondary | ICD-10-CM | POA: Insufficient documentation

## 2023-11-04 DIAGNOSIS — C61 Malignant neoplasm of prostate: Secondary | ICD-10-CM | POA: Diagnosis not present

## 2023-11-04 DIAGNOSIS — Z8546 Personal history of malignant neoplasm of prostate: Secondary | ICD-10-CM | POA: Insufficient documentation

## 2023-11-04 MED ORDER — ONDANSETRON HCL 4 MG PO TABS: 4.0000 mg | ORAL_TABLET | Freq: Three times a day (TID) | ORAL | 1 refills | Status: DC | PRN

## 2023-11-05 ENCOUNTER — Ambulatory Visit
Admission: RE | Admit: 2023-11-05 | Discharge: 2023-11-05 | Disposition: A | Discharge status: Home / Self Care | Payer: Medicare Other | Source: Ambulatory Visit | Attending: Radiation Oncology | Admitting: Radiation Oncology

## 2023-11-05 DIAGNOSIS — C7951 Secondary malignant neoplasm of bone: Secondary | ICD-10-CM | POA: Insufficient documentation

## 2023-11-05 DIAGNOSIS — Z510 Encounter for antineoplastic radiation therapy: Secondary | ICD-10-CM | POA: Diagnosis not present

## 2023-11-11 DIAGNOSIS — C7951 Secondary malignant neoplasm of bone: Secondary | ICD-10-CM | POA: Diagnosis not present

## 2023-11-11 DIAGNOSIS — Z510 Encounter for antineoplastic radiation therapy: Secondary | ICD-10-CM | POA: Diagnosis not present

## 2023-11-16 ENCOUNTER — Ambulatory Visit
Admission: RE | Admit: 2023-11-16 | Discharge: 2023-11-16 | Disposition: A | Discharge status: Home / Self Care | Payer: Medicare Other | Source: Ambulatory Visit | Attending: Radiation Oncology | Admitting: Radiation Oncology

## 2023-11-16 ENCOUNTER — Other Ambulatory Visit: Payer: Self-pay

## 2023-11-16 DIAGNOSIS — Z510 Encounter for antineoplastic radiation therapy: Secondary | ICD-10-CM | POA: Diagnosis not present

## 2023-11-16 DIAGNOSIS — C7951 Secondary malignant neoplasm of bone: Secondary | ICD-10-CM | POA: Diagnosis not present

## 2023-11-16 DIAGNOSIS — C61 Malignant neoplasm of prostate: Secondary | ICD-10-CM | POA: Diagnosis not present

## 2023-11-16 LAB — RAD ONC ARIA SESSION SUMMARY
Course Elapsed Days: 0
Plan Fractions Treated to Date: 1
Plan Fractions Treated to Date: 1
Plan Fractions Treated to Date: 1
Plan Prescribed Dose Per Fraction: 3 Gy
Plan Prescribed Dose Per Fraction: 3 Gy
Plan Prescribed Dose Per Fraction: 3 Gy
Plan Total Fractions Prescribed: 10
Plan Total Fractions Prescribed: 10
Plan Total Fractions Prescribed: 10
Plan Total Prescribed Dose: 30 Gy
Plan Total Prescribed Dose: 30 Gy
Plan Total Prescribed Dose: 30 Gy
Reference Point Dosage Given to Date: 3 Gy
Reference Point Dosage Given to Date: 3 Gy
Reference Point Dosage Given to Date: 3 Gy
Reference Point Session Dosage Given: 3 Gy
Reference Point Session Dosage Given: 3 Gy
Reference Point Session Dosage Given: 3 Gy
Session Number: 1

## 2023-11-17 ENCOUNTER — Ambulatory Visit
Admission: RE | Admit: 2023-11-17 | Discharge: 2023-11-17 | Disposition: A | Discharge status: Home / Self Care | Payer: Medicare Other | Source: Ambulatory Visit | Attending: Radiation Oncology | Admitting: Radiation Oncology

## 2023-11-17 ENCOUNTER — Other Ambulatory Visit: Payer: Self-pay

## 2023-11-17 DIAGNOSIS — Z510 Encounter for antineoplastic radiation therapy: Secondary | ICD-10-CM | POA: Diagnosis not present

## 2023-11-17 DIAGNOSIS — C7951 Secondary malignant neoplasm of bone: Secondary | ICD-10-CM | POA: Diagnosis not present

## 2023-11-17 LAB — RAD ONC ARIA SESSION SUMMARY
Course Elapsed Days: 1
Plan Fractions Treated to Date: 2
Plan Fractions Treated to Date: 2
Plan Fractions Treated to Date: 2
Plan Prescribed Dose Per Fraction: 3 Gy
Plan Prescribed Dose Per Fraction: 3 Gy
Plan Prescribed Dose Per Fraction: 3 Gy
Plan Total Fractions Prescribed: 10
Plan Total Fractions Prescribed: 10
Plan Total Fractions Prescribed: 10
Plan Total Prescribed Dose: 30 Gy
Plan Total Prescribed Dose: 30 Gy
Plan Total Prescribed Dose: 30 Gy
Reference Point Dosage Given to Date: 6 Gy
Reference Point Dosage Given to Date: 6 Gy
Reference Point Dosage Given to Date: 6 Gy
Reference Point Session Dosage Given: 3 Gy
Reference Point Session Dosage Given: 3 Gy
Reference Point Session Dosage Given: 3 Gy
Session Number: 2

## 2023-11-18 ENCOUNTER — Other Ambulatory Visit: Payer: Self-pay | Admitting: Urology

## 2023-11-18 ENCOUNTER — Ambulatory Visit
Admission: RE | Admit: 2023-11-18 | Discharge: 2023-11-18 | Disposition: A | Discharge status: Home / Self Care | Payer: Medicare Other | Source: Ambulatory Visit | Attending: Radiation Oncology | Admitting: Radiation Oncology

## 2023-11-18 ENCOUNTER — Other Ambulatory Visit: Payer: Self-pay

## 2023-11-18 DIAGNOSIS — Z510 Encounter for antineoplastic radiation therapy: Secondary | ICD-10-CM | POA: Diagnosis not present

## 2023-11-18 DIAGNOSIS — N138 Other obstructive and reflux uropathy: Secondary | ICD-10-CM

## 2023-11-18 DIAGNOSIS — C7951 Secondary malignant neoplasm of bone: Secondary | ICD-10-CM | POA: Diagnosis not present

## 2023-11-18 LAB — RAD ONC ARIA SESSION SUMMARY
Course Elapsed Days: 2
Plan Fractions Treated to Date: 3
Plan Fractions Treated to Date: 3
Plan Fractions Treated to Date: 3
Plan Prescribed Dose Per Fraction: 3 Gy
Plan Prescribed Dose Per Fraction: 3 Gy
Plan Prescribed Dose Per Fraction: 3 Gy
Plan Total Fractions Prescribed: 10
Plan Total Fractions Prescribed: 10
Plan Total Fractions Prescribed: 10
Plan Total Prescribed Dose: 30 Gy
Plan Total Prescribed Dose: 30 Gy
Plan Total Prescribed Dose: 30 Gy
Reference Point Dosage Given to Date: 9 Gy
Reference Point Dosage Given to Date: 9 Gy
Reference Point Dosage Given to Date: 9 Gy
Reference Point Session Dosage Given: 3 Gy
Reference Point Session Dosage Given: 3 Gy
Reference Point Session Dosage Given: 3 Gy
Session Number: 3

## 2023-11-18 MED ORDER — SILODOSIN 8 MG PO CAPS: 8.0000 mg | ORAL_CAPSULE | Freq: Every day | ORAL | 3 refills | Status: DC

## 2023-11-19 ENCOUNTER — Other Ambulatory Visit: Payer: Self-pay

## 2023-11-19 ENCOUNTER — Ambulatory Visit
Admission: RE | Admit: 2023-11-19 | Discharge: 2023-11-19 | Disposition: A | Discharge status: Home / Self Care | Payer: Medicare Other | Source: Ambulatory Visit | Attending: Radiation Oncology | Admitting: Radiation Oncology

## 2023-11-19 DIAGNOSIS — C7951 Secondary malignant neoplasm of bone: Secondary | ICD-10-CM | POA: Diagnosis not present

## 2023-11-19 DIAGNOSIS — Z510 Encounter for antineoplastic radiation therapy: Secondary | ICD-10-CM | POA: Diagnosis not present

## 2023-11-19 LAB — RAD ONC ARIA SESSION SUMMARY
Course Elapsed Days: 3
Plan Fractions Treated to Date: 4
Plan Fractions Treated to Date: 4
Plan Fractions Treated to Date: 4
Plan Prescribed Dose Per Fraction: 3 Gy
Plan Prescribed Dose Per Fraction: 3 Gy
Plan Prescribed Dose Per Fraction: 3 Gy
Plan Total Fractions Prescribed: 10
Plan Total Fractions Prescribed: 10
Plan Total Fractions Prescribed: 10
Plan Total Prescribed Dose: 30 Gy
Plan Total Prescribed Dose: 30 Gy
Plan Total Prescribed Dose: 30 Gy
Reference Point Dosage Given to Date: 12 Gy
Reference Point Dosage Given to Date: 12 Gy
Reference Point Dosage Given to Date: 12 Gy
Reference Point Session Dosage Given: 3 Gy
Reference Point Session Dosage Given: 3 Gy
Reference Point Session Dosage Given: 3 Gy
Session Number: 4

## 2023-11-20 ENCOUNTER — Other Ambulatory Visit: Payer: Self-pay

## 2023-11-20 ENCOUNTER — Ambulatory Visit
Admission: RE | Admit: 2023-11-20 | Discharge: 2023-11-20 | Disposition: A | Discharge status: Home / Self Care | Payer: Medicare Other | Source: Ambulatory Visit | Attending: Radiation Oncology | Admitting: Radiation Oncology

## 2023-11-20 DIAGNOSIS — C7951 Secondary malignant neoplasm of bone: Secondary | ICD-10-CM | POA: Diagnosis not present

## 2023-11-20 DIAGNOSIS — Z510 Encounter for antineoplastic radiation therapy: Secondary | ICD-10-CM | POA: Diagnosis not present

## 2023-11-20 LAB — RAD ONC ARIA SESSION SUMMARY
Course Elapsed Days: 4
Plan Fractions Treated to Date: 5
Plan Fractions Treated to Date: 5
Plan Fractions Treated to Date: 5
Plan Prescribed Dose Per Fraction: 3 Gy
Plan Prescribed Dose Per Fraction: 3 Gy
Plan Prescribed Dose Per Fraction: 3 Gy
Plan Total Fractions Prescribed: 10
Plan Total Fractions Prescribed: 10
Plan Total Fractions Prescribed: 10
Plan Total Prescribed Dose: 30 Gy
Plan Total Prescribed Dose: 30 Gy
Plan Total Prescribed Dose: 30 Gy
Reference Point Dosage Given to Date: 15 Gy
Reference Point Dosage Given to Date: 15 Gy
Reference Point Dosage Given to Date: 15 Gy
Reference Point Session Dosage Given: 3 Gy
Reference Point Session Dosage Given: 3 Gy
Reference Point Session Dosage Given: 3 Gy
Session Number: 5

## 2023-11-23 ENCOUNTER — Other Ambulatory Visit: Payer: Self-pay

## 2023-11-23 ENCOUNTER — Ambulatory Visit
Admission: RE | Admit: 2023-11-23 | Discharge: 2023-11-23 | Disposition: A | Discharge status: Home / Self Care | Payer: Medicare Other | Source: Ambulatory Visit | Attending: Radiation Oncology | Admitting: Radiation Oncology

## 2023-11-23 DIAGNOSIS — C7951 Secondary malignant neoplasm of bone: Secondary | ICD-10-CM | POA: Diagnosis not present

## 2023-11-23 DIAGNOSIS — Z510 Encounter for antineoplastic radiation therapy: Secondary | ICD-10-CM | POA: Diagnosis not present

## 2023-11-23 LAB — RAD ONC ARIA SESSION SUMMARY
Course Elapsed Days: 7
Plan Fractions Treated to Date: 6
Plan Fractions Treated to Date: 6
Plan Fractions Treated to Date: 6
Plan Prescribed Dose Per Fraction: 3 Gy
Plan Prescribed Dose Per Fraction: 3 Gy
Plan Prescribed Dose Per Fraction: 3 Gy
Plan Total Fractions Prescribed: 10
Plan Total Fractions Prescribed: 10
Plan Total Fractions Prescribed: 10
Plan Total Prescribed Dose: 30 Gy
Plan Total Prescribed Dose: 30 Gy
Plan Total Prescribed Dose: 30 Gy
Reference Point Dosage Given to Date: 18 Gy
Reference Point Dosage Given to Date: 18 Gy
Reference Point Dosage Given to Date: 18 Gy
Reference Point Session Dosage Given: 3 Gy
Reference Point Session Dosage Given: 3 Gy
Reference Point Session Dosage Given: 3 Gy
Session Number: 6

## 2023-11-24 ENCOUNTER — Ambulatory Visit
Admission: RE | Admit: 2023-11-24 | Discharge: 2023-11-24 | Disposition: A | Discharge status: Home / Self Care | Payer: Medicare Other | Source: Ambulatory Visit | Attending: Radiation Oncology | Admitting: Radiation Oncology

## 2023-11-24 ENCOUNTER — Other Ambulatory Visit: Payer: Self-pay

## 2023-11-24 DIAGNOSIS — Z510 Encounter for antineoplastic radiation therapy: Secondary | ICD-10-CM | POA: Diagnosis not present

## 2023-11-24 DIAGNOSIS — C7951 Secondary malignant neoplasm of bone: Secondary | ICD-10-CM | POA: Diagnosis not present

## 2023-11-24 LAB — RAD ONC ARIA SESSION SUMMARY
Course Elapsed Days: 8
Plan Fractions Treated to Date: 7
Plan Fractions Treated to Date: 7
Plan Fractions Treated to Date: 7
Plan Prescribed Dose Per Fraction: 3 Gy
Plan Prescribed Dose Per Fraction: 3 Gy
Plan Prescribed Dose Per Fraction: 3 Gy
Plan Total Fractions Prescribed: 10
Plan Total Fractions Prescribed: 10
Plan Total Fractions Prescribed: 10
Plan Total Prescribed Dose: 30 Gy
Plan Total Prescribed Dose: 30 Gy
Plan Total Prescribed Dose: 30 Gy
Reference Point Dosage Given to Date: 21 Gy
Reference Point Dosage Given to Date: 21 Gy
Reference Point Dosage Given to Date: 21 Gy
Reference Point Session Dosage Given: 3 Gy
Reference Point Session Dosage Given: 3 Gy
Reference Point Session Dosage Given: 3 Gy
Session Number: 7

## 2023-11-25 ENCOUNTER — Other Ambulatory Visit: Payer: Self-pay

## 2023-11-25 ENCOUNTER — Ambulatory Visit
Admission: RE | Admit: 2023-11-25 | Discharge: 2023-11-25 | Disposition: A | Discharge status: Home / Self Care | Payer: Medicare Other | Source: Ambulatory Visit | Attending: Radiation Oncology | Admitting: Radiation Oncology

## 2023-11-25 DIAGNOSIS — Z510 Encounter for antineoplastic radiation therapy: Secondary | ICD-10-CM | POA: Diagnosis not present

## 2023-11-25 DIAGNOSIS — C7951 Secondary malignant neoplasm of bone: Secondary | ICD-10-CM | POA: Diagnosis not present

## 2023-11-25 LAB — RAD ONC ARIA SESSION SUMMARY
Course Elapsed Days: 9
Plan Fractions Treated to Date: 8
Plan Fractions Treated to Date: 8
Plan Fractions Treated to Date: 8
Plan Prescribed Dose Per Fraction: 3 Gy
Plan Prescribed Dose Per Fraction: 3 Gy
Plan Prescribed Dose Per Fraction: 3 Gy
Plan Total Fractions Prescribed: 10
Plan Total Fractions Prescribed: 10
Plan Total Fractions Prescribed: 10
Plan Total Prescribed Dose: 30 Gy
Plan Total Prescribed Dose: 30 Gy
Plan Total Prescribed Dose: 30 Gy
Reference Point Dosage Given to Date: 24 Gy
Reference Point Dosage Given to Date: 24 Gy
Reference Point Dosage Given to Date: 24 Gy
Reference Point Session Dosage Given: 3 Gy
Reference Point Session Dosage Given: 3 Gy
Reference Point Session Dosage Given: 3 Gy
Session Number: 8

## 2023-11-26 ENCOUNTER — Ambulatory Visit
Admission: RE | Admit: 2023-11-26 | Discharge: 2023-11-26 | Disposition: A | Discharge status: Home / Self Care | Payer: Medicare Other | Source: Ambulatory Visit | Attending: Radiation Oncology | Admitting: Radiation Oncology

## 2023-11-26 ENCOUNTER — Other Ambulatory Visit: Payer: Self-pay

## 2023-11-26 DIAGNOSIS — C7951 Secondary malignant neoplasm of bone: Secondary | ICD-10-CM | POA: Diagnosis not present

## 2023-11-26 DIAGNOSIS — Z510 Encounter for antineoplastic radiation therapy: Secondary | ICD-10-CM | POA: Diagnosis not present

## 2023-11-26 LAB — RAD ONC ARIA SESSION SUMMARY
Course Elapsed Days: 10
Plan Fractions Treated to Date: 9
Plan Fractions Treated to Date: 9
Plan Fractions Treated to Date: 9
Plan Prescribed Dose Per Fraction: 3 Gy
Plan Prescribed Dose Per Fraction: 3 Gy
Plan Prescribed Dose Per Fraction: 3 Gy
Plan Total Fractions Prescribed: 10
Plan Total Fractions Prescribed: 10
Plan Total Fractions Prescribed: 10
Plan Total Prescribed Dose: 30 Gy
Plan Total Prescribed Dose: 30 Gy
Plan Total Prescribed Dose: 30 Gy
Reference Point Dosage Given to Date: 27 Gy
Reference Point Dosage Given to Date: 27 Gy
Reference Point Dosage Given to Date: 27 Gy
Reference Point Session Dosage Given: 3 Gy
Reference Point Session Dosage Given: 3 Gy
Reference Point Session Dosage Given: 3 Gy
Session Number: 9

## 2023-11-27 ENCOUNTER — Ambulatory Visit
Admission: RE | Admit: 2023-11-27 | Discharge: 2023-11-27 | Disposition: A | Discharge status: Home / Self Care | Payer: Medicare Other | Source: Ambulatory Visit | Attending: Radiation Oncology | Admitting: Radiation Oncology

## 2023-11-27 ENCOUNTER — Other Ambulatory Visit: Payer: Self-pay

## 2023-11-27 DIAGNOSIS — C7951 Secondary malignant neoplasm of bone: Secondary | ICD-10-CM | POA: Diagnosis not present

## 2023-11-27 DIAGNOSIS — Z510 Encounter for antineoplastic radiation therapy: Secondary | ICD-10-CM | POA: Diagnosis not present

## 2023-11-27 LAB — RAD ONC ARIA SESSION SUMMARY
Course Elapsed Days: 11
Plan Fractions Treated to Date: 10
Plan Fractions Treated to Date: 10
Plan Fractions Treated to Date: 10
Plan Prescribed Dose Per Fraction: 3 Gy
Plan Prescribed Dose Per Fraction: 3 Gy
Plan Prescribed Dose Per Fraction: 3 Gy
Plan Total Fractions Prescribed: 10
Plan Total Fractions Prescribed: 10
Plan Total Fractions Prescribed: 10
Plan Total Prescribed Dose: 30 Gy
Plan Total Prescribed Dose: 30 Gy
Plan Total Prescribed Dose: 30 Gy
Reference Point Dosage Given to Date: 30 Gy
Reference Point Dosage Given to Date: 30 Gy
Reference Point Dosage Given to Date: 30 Gy
Reference Point Session Dosage Given: 3 Gy
Reference Point Session Dosage Given: 3 Gy
Reference Point Session Dosage Given: 3 Gy
Session Number: 10

## 2023-11-30 NOTE — Radiation Completion Notes (Signed)
  Radiation Oncology         (336) [phone] ________________________________  Name: AJDIN MACKE MRN: [id number]  Date of Service: 11/27/2023  DOB: 1951-06-07  End of Treatment Note    Diagnosis: Metastasis to the right and left iliac bone as well as L1 from stage IV (cT2b, cN0, pM1) prostate cancer    ==========DELIVERED PLANS==========  First Treatment Date: 2023-11-16 Last Treatment Date: 2023-11-27   Plan Name: Spine_L1 Site: Lumbar Spine Technique: 3D Mode: Photon Dose Per Fraction: 3 Gy Prescribed Dose (Delivered / Prescribed): 30 Gy / 30 Gy Prescribed Fxs (Delivered / Prescribed): 10 / 10   Plan Name: Pelvis_R Site: [place], Right Technique: 3D Mode: Photon Dose Per Fraction: 3 Gy Prescribed Dose (Delivered / Prescribed): 30 Gy / 30 Gy Prescribed Fxs (Delivered / Prescribed): 10 / 10   Plan Name: Pelvis_L Site: [place], Left Technique: 3D Mode: Photon Dose Per Fraction: 3 Gy Prescribed Dose (Delivered / Prescribed): 30 Gy / 30 Gy Prescribed Fxs (Delivered / Prescribed): 10 / 10     ====================================   The patient tolerated radiation well. He developed anticipated fatigue as well as mild nausea and diarrhea during the treatment.   The patient will return in one month and will continue follow up with Dr. Myna Hidalgo as well.      Joyice Faster, PA-C

## 2023-12-14 ENCOUNTER — Other Ambulatory Visit: Payer: Self-pay

## 2023-12-14 ENCOUNTER — Inpatient Hospital Stay: Payer: Medicare Other | Attending: Hematology & Oncology

## 2023-12-14 ENCOUNTER — Inpatient Hospital Stay (HOSPITAL_BASED_OUTPATIENT_CLINIC_OR_DEPARTMENT_OTHER): Payer: Medicare Other | Admitting: Hematology & Oncology

## 2023-12-14 ENCOUNTER — Encounter: Payer: Self-pay | Admitting: Hematology & Oncology

## 2023-12-14 ENCOUNTER — Inpatient Hospital Stay

## 2023-12-14 VITALS — BP 127/74 | HR 61

## 2023-12-14 VITALS — BP 84/49 | HR 74 | Temp 98.1°F | Resp 17 | Ht 72.0 in | Wt 213.0 lb

## 2023-12-14 DIAGNOSIS — Z7901 Long term (current) use of anticoagulants: Secondary | ICD-10-CM | POA: Diagnosis not present

## 2023-12-14 DIAGNOSIS — Z191 Hormone sensitive malignancy status: Secondary | ICD-10-CM | POA: Insufficient documentation

## 2023-12-14 DIAGNOSIS — R232 Flushing: Secondary | ICD-10-CM | POA: Diagnosis not present

## 2023-12-14 DIAGNOSIS — C7951 Secondary malignant neoplasm of bone: Secondary | ICD-10-CM | POA: Diagnosis not present

## 2023-12-14 DIAGNOSIS — R351 Nocturia: Secondary | ICD-10-CM | POA: Insufficient documentation

## 2023-12-14 DIAGNOSIS — Z79899 Other long term (current) drug therapy: Secondary | ICD-10-CM | POA: Insufficient documentation

## 2023-12-14 DIAGNOSIS — C61 Malignant neoplasm of prostate: Secondary | ICD-10-CM | POA: Insufficient documentation

## 2023-12-14 LAB — CMP (CANCER CENTER ONLY)
ALT: 24 U/L (ref 0–44)
AST: 31 U/L (ref 15–41)
Albumin: 4.2 g/dL (ref 3.5–5.0)
Alkaline Phosphatase: 53 U/L (ref 38–126)
Anion gap: 7 (ref 5–15)
BUN: 13 mg/dL (ref 8–23)
CO2: 30 mmol/L (ref 22–32)
Calcium: 9.6 mg/dL (ref 8.9–10.3)
Chloride: 102 mmol/L (ref 98–111)
Creatinine: 0.9 mg/dL (ref 0.61–1.24)
GFR, Estimated: >60 mL/min (ref >60)
Glucose, Bld: 201 mg/dL — ABNORMAL HIGH (ref 70–99)
Potassium: 5.4 mmol/L — ABNORMAL HIGH (ref 3.5–5.1)
Sodium: 139 mmol/L (ref 135–145)
Total Bilirubin: 0.9 mg/dL (ref 0.0–1.2)
Total Protein: 7 g/dL (ref 6.5–8.1)

## 2023-12-14 LAB — CBC WITH DIFFERENTIAL (CANCER CENTER ONLY)
Abs Immature Granulocytes: 0.04 10*3/uL (ref 0.00–0.07)
Basophils Absolute: 0 10*3/uL (ref 0.0–0.1)
Basophils Relative: 0 %
Eosinophils Absolute: 0.2 10*3/uL (ref 0.0–0.5)
Eosinophils Relative: 3 %
HCT: 41.3 % (ref 39.0–52.0)
Hemoglobin: 14.1 g/dL (ref 13.0–17.0)
Immature Granulocytes: 1 %
Lymphocytes Relative: 17 %
Lymphs Abs: 0.9 10*3/uL (ref 0.7–4.0)
MCH: 34 pg (ref 26.0–34.0)
MCHC: 34.1 g/dL (ref 30.0–36.0)
MCV: 99.5 fL (ref 80.0–100.0)
Monocytes Absolute: 0.6 10*3/uL (ref 0.1–1.0)
Monocytes Relative: 11 %
Neutro Abs: 3.8 10*3/uL (ref 1.7–7.7)
Neutrophils Relative %: 68 %
Platelet Count: 148 10*3/uL — ABNORMAL LOW (ref 150–400)
RBC: 4.15 MIL/uL — ABNORMAL LOW (ref 4.22–5.81)
RDW: 13.3 % (ref 11.5–15.5)
WBC Count: 5.5 10*3/uL (ref 4.0–10.5)
nRBC: 0 % (ref 0.0–0.2)

## 2023-12-14 MED ORDER — ZOLEDRONIC ACID 4 MG/100ML IV SOLN
4.0000 mg | Freq: Once | INTRAVENOUS | Status: AC
Start: 2023-12-14 — End: 2023-12-14
  Administered 2023-12-14: 4 mg via INTRAVENOUS
  Filled 2023-12-14: qty 100

## 2023-12-14 MED ORDER — GLIMEPIRIDE 2 MG PO TABS: 2.0000 mg | ORAL_TABLET | Freq: Every day | ORAL | 5 refills | Status: DC

## 2023-12-14 MED ORDER — ONDANSETRON HCL 4 MG PO TABS: 4.0000 mg | ORAL_TABLET | Freq: Three times a day (TID) | ORAL | 3 refills | Status: AC | PRN

## 2023-12-14 MED ORDER — SODIUM CHLORIDE 0.9 % IV SOLN
INTRAVENOUS | Status: DC
Start: 2023-12-14 — End: 2023-12-14

## 2023-12-14 NOTE — Progress Notes (Signed)
 Ok to proceed with Zometa today. Infuse Zometa over 45 minutes.  Anola Gurney [place], [place], BCPS, BCOP 12/14/2023 10:51 AM

## 2023-12-14 NOTE — Progress Notes (Signed)
 Hematology and Oncology Follow Up Visit  William Phillips [id number] 1951-01-29 73 y.o. 12/14/2023   Principle Diagnosis:  Metastatic prostate cancer-castrate sensitive-oligometastatic  Current Therapy:   Eligard 45 mg IM every 6 months next dose 03/2024 Nubeqa 600 mg p.o. twice daily-started on 07/30/2023 Zometa 4 mg IV every 3 months -next on 03/2024  Status post radiotherapy to right iliac and L1    Interim History:  William Phillips is back for follow-up.  He is doing okay.  He did have radiotherapy for a couple areas.  Right hip seems to be doing a lot better.  The lower back still has little bit of discomfort.  His last PSA was still pretty much undetectable.  He is having some hot flashes and sweats.  I am not surprised by this.  His blood sugars are on the high side.  This probably is from his [place]  I will go ahead and put him on some Amaryl (2 mg p.o. daily) and see if his blood sugar improves.  His blood pressure is also on the low side.  He is on 3 different blood pressure medications.  I think we can probably stop the amlodipine.  He has had no issues with cough or shortness of breath.  He has had little bit of nausea.  Zofran seem to help with this.  He has had no rashes.  There is been no leg swelling.  He has had no obvious change in bowel or bladder habits.  He does have a little bit of incontinence.  He says he has some nocturia.  He will see his Urologist next week sure that they can help with this problem.  Overall, I would have said that his performance status is probably ECOG 1.    Medications:  Current Outpatient Medications:    amLODipine (NORVASC) 10 MG tablet, TAKE ONE-HALF TABLET BY MOUTH  TWICE DAILY, Disp: 100 tablet, Rfl: 2   apixaban (ELIQUIS) 5 MG TABS tablet, TAKE 1 TABLET BY MOUTH TWICE  DAILY, Disp: 200 tablet, Rfl: 1   Ascorbic Acid (VITAMIN C PO), Take 1 tablet by mouth daily., Disp: , Rfl:    Brimonidine Tartrate (LUMIFY) 0.025 % SOLN, Place 1  drop into both eyes every morning., Disp: , Rfl:    Calcium Carbonate-Vit D-Min (CALCIUM 1200 PO), Take 1,200 mg by mouth daily., Disp: , Rfl:    Carboxymethylcellulose Sodium (LUBRICANT EYE DROPS OP), Place 1 drop into both eyes 2 (two) times daily as needed (Dry eye)., Disp: , Rfl:    Clobetasol Propionate 0.05 % shampoo, Apply 1 application topically daily as needed (irritation)., Disp: , Rfl:    clotrimazole (CLOTRIMAZOLE ANTI-FUNGAL) 1 % cream, Apply 1 Application topically 2 (two) times daily., Disp: 30 g, Rfl: 0   darolutamide (NUBEQA) 300 MG tablet, Take 2 tablets (600 mg total) by mouth 2 (two) times daily with a meal., Disp: 120 tablet, Rfl: 0   fexofenadine (ALLEGRA) 180 MG tablet, Take 1 tablet (180 mg total) by mouth daily., Disp: , Rfl:    fluticasone (FLONASE) 50 MCG/ACT nasal spray, Place 2 sprays into both nostrils daily as needed for allergies or rhinitis., Disp: , Rfl: 2   ketoconazole (NIZORAL) 2 % shampoo, Apply 1 application topically daily as needed for irritation., Disp: , Rfl:    lisinopril-hydrochlorothiazide (ZESTORETIC) 20-12.5 MG tablet, TAKE 1 TABLET BY MOUTH DAILY, Disp: 100 tablet, Rfl: 2   metoprolol succinate (TOPROL-XL) 50 MG 24 hr tablet, TAKE 1 TABLET BY MOUTH TWICE  DAILY WITH OR IMMEDIATELY  FOLLOWING A MEAL, Disp: 200 tablet, Rfl: 2   Multiple Vitamin (MULTIVITAMIN) tablet, Take 1 tablet by mouth every evening., Disp: , Rfl:    nitrofurantoin, macrocrystal-monohydrate, (MACROBID) 100 MG capsule, Take 1 capsule (100 mg total) by mouth 2 (two) times daily., Disp: 10 capsule, Rfl: 0   omeprazole (PRILOSEC) 20 MG capsule, TAKE 1 CAPSULE BY MOUTH DAILY, Disp: 100 capsule, Rfl: 2   ondansetron (ZOFRAN) 4 MG tablet, Take 1 tablet (4 mg total) by mouth every 8 (eight) hours as needed for nausea or vomiting., Disp: 20 tablet, Rfl: 1   oxyCODONE-acetaminophen (PERCOCET) 5-325 MG tablet, Take 1 tablet by mouth every 6 (six) hours as needed., Disp: 10 tablet, Rfl: 0    rosuvastatin (CRESTOR) 5 MG tablet, Take 1 tablet (5 mg total) by mouth daily., Disp: 30 tablet, Rfl: 5   sildenafil (REVATIO) 20 MG tablet, TAKE 1 TO 4 TABLETS BY MOUTH DAILY AS NEEDED, Disp: 90 tablet, Rfl: 0   silodosin (RAPAFLO) 8 MG CAPS capsule, Take 1 capsule (8 mg total) by mouth daily with breakfast., Disp: 90 capsule, Rfl: 3   VITAMIN D PO, Take 1 capsule by mouth daily. 1000 units daily., Disp: , Rfl:   Allergies:  Allergies  Allergen Reactions   Penicillins Swelling    irriation    Past Medical History, Surgical history, Social history, and Family History were reviewed and updated.  Review of Systems: Review of Systems  Constitutional: Negative.   HENT:  Negative.    Eyes: Negative.   Respiratory: Negative.    Cardiovascular: Negative.   Gastrointestinal: Negative.   Endocrine: Negative.   Musculoskeletal:  Positive for back pain and flank pain.  Neurological: Negative.   Hematological: Negative.   Psychiatric/Behavioral: Negative.      Physical Exam: Temperature 98.1.  Pulse 74.  Blood pressure 84/39.  Weight is 213 pounds..   Wt Readings from Last 3 Encounters:  12/14/23 213 lb (96.6 kg)  11/04/23 212 lb 3.2 oz (96.3 kg)  11/02/23 212 lb (96.2 kg)    Physical Exam Vitals reviewed.  HENT:     Head: Normocephalic and atraumatic.  Eyes:     Pupils: Pupils are equal, round, and reactive to light.  Cardiovascular:     Rate and Rhythm: Normal rate and regular rhythm.     Heart sounds: Normal heart sounds.  Pulmonary:     Effort: Pulmonary effort is normal.     Breath sounds: Normal breath sounds.  Abdominal:     General: Bowel sounds are normal.     Palpations: Abdomen is soft.  Musculoskeletal:        General: No tenderness or deformity. Normal range of motion.     Cervical back: Normal range of motion.  Lymphadenopathy:     Cervical: No cervical adenopathy.  Skin:    General: Skin is warm and dry.     Findings: No erythema or rash.  Neurological:      Mental Status: He is alert and oriented to person, place, and time.  Psychiatric:        Behavior: Behavior normal.        Thought Content: Thought content normal.        Judgment: Judgment normal.      Lab Results  Component Value Date   WBC 5.5 12/14/2023   HGB 14.1 12/14/2023   HCT 41.3 12/14/2023   MCV 99.5 12/14/2023   PLT 148 (L) 12/14/2023     Chemistry  Component Value Date/Time   NA 139 12/14/2023 0853   NA 139 08/05/2022 1606   K 5.4 (H) 12/14/2023 0853   CL 102 12/14/2023 0853   CO2 30 12/14/2023 0853   BUN 13 12/14/2023 0853   BUN 10 08/05/2022 1606   CREATININE 0.90 12/14/2023 0853   CREATININE 0.77 11/29/2014 1512      Component Value Date/Time   CALCIUM 9.6 12/14/2023 0853   ALKPHOS 53 12/14/2023 0853   AST 31 12/14/2023 0853   ALT 24 12/14/2023 0853   BILITOT 0.9 12/14/2023 0853      Impression and Plan: Mr. Kunath is a very nice 73 year old white male.  He has metastatic prostate cancer.  He is on antiandrogen therapy right now.  I feel confident that everything is working well.    We probably will have to get another PET scan on him.  He finishes radiotherapy about a month ago.  Another PET scan probably in April would be a good idea.  He does get Zometa.  We will go ahead and start that today.  Hopefully, we get his blood sugars under better control.  I think that the Amaryl should work pretty well.  I would like to see him back here in about 6 weeks.  We will see about getting the PSMA PET scan on him before I see him back.      Josph Macho, MD 3/3/202510:04 AM

## 2023-12-14 NOTE — Patient Instructions (Signed)

## 2023-12-15 ENCOUNTER — Encounter: Payer: Self-pay | Admitting: *Deleted

## 2023-12-15 LAB — PSA, TOTAL AND FREE
PSA, Free Pct: UNDETERMINED %
PSA, Free: <0.02 ng/mL
Prostate Specific Ag, Serum: <0.1 ng/mL (ref 0.0–4.0)

## 2023-12-15 LAB — TESTOSTERONE: Testosterone: 21 ng/dL — ABNORMAL LOW (ref 264–916)

## 2023-12-15 NOTE — Progress Notes (Signed)
 Patient finished radiation last month. Patient will need a PSMA PET to assess treatment response. Dr Myna Hidalgo requested PET in April, however Dr Roselind Messier would like the PET three months after the end of radiation which would be May. Dr Myna Hidalgo is okay with the delay.  Oncology Nurse Navigator Documentation     12/15/2023    9:30 AM  Oncology Nurse Navigator Flowsheets  Phase of Treatment Radiation  Radiation Actual Start Date: 11/16/2023  Radiation Actual End Date: 11/27/2023  Navigator Follow Up Date: 02/01/2024  Navigator Follow Up Reason: Follow-up Appointment  Navigator Location [facility]  Navigator Encounter Type Appt/Treatment Plan Review  Patient Visit Type MedOnc  Treatment Phase Active Tx  Barriers/Navigation Needs Coordination of Care  Interventions Coordination of Care  Acuity Level 1-No Barriers  Coordination of Care Radiology  Support Groups/Services Friends and Family  Time Spent with Patient 15

## 2023-12-21 ENCOUNTER — Encounter: Payer: Self-pay | Admitting: Hematology & Oncology

## 2023-12-22 ENCOUNTER — Other Ambulatory Visit: Payer: Self-pay | Admitting: *Deleted

## 2023-12-22 DIAGNOSIS — C61 Malignant neoplasm of prostate: Secondary | ICD-10-CM

## 2023-12-22 MED ORDER — NUBEQA 300 MG PO TABS: 600.0000 mg | ORAL_TABLET | Freq: Two times a day (BID) | ORAL | 0 refills | Status: DC

## 2023-12-23 ENCOUNTER — Ambulatory Visit: Payer: Medicare Other | Admitting: Urology

## 2023-12-23 ENCOUNTER — Encounter: Payer: Self-pay | Admitting: Urology

## 2023-12-23 VITALS — BP 98/61 | HR 77 | Ht 72.0 in | Wt 210.0 lb

## 2023-12-23 DIAGNOSIS — N138 Other obstructive and reflux uropathy: Secondary | ICD-10-CM

## 2023-12-23 DIAGNOSIS — Z79818 Long term (current) use of other agents affecting estrogen receptors and estrogen levels: Secondary | ICD-10-CM

## 2023-12-23 DIAGNOSIS — N401 Enlarged prostate with lower urinary tract symptoms: Secondary | ICD-10-CM | POA: Diagnosis not present

## 2023-12-23 DIAGNOSIS — R8290 Unspecified abnormal findings in urine: Secondary | ICD-10-CM | POA: Diagnosis not present

## 2023-12-23 DIAGNOSIS — C61 Malignant neoplasm of prostate: Secondary | ICD-10-CM | POA: Diagnosis not present

## 2023-12-23 DIAGNOSIS — C7951 Secondary malignant neoplasm of bone: Secondary | ICD-10-CM

## 2023-12-23 LAB — MICROSCOPIC EXAMINATION: WBC, UA: >30 /HPF — AB (ref 0–5)

## 2023-12-23 LAB — URINALYSIS, ROUTINE W REFLEX MICROSCOPIC
Bilirubin, UA: NEGATIVE
Nitrite, UA: POSITIVE — AB
Specific Gravity, UA: 1.025 (ref 1.005–1.030)
Urobilinogen, Ur: 0.2 mg/dL (ref 0.2–1.0)
pH, UA: 7 (ref 5.0–7.5)

## 2023-12-23 LAB — BLADDER SCAN AMB NON-IMAGING

## 2023-12-23 MED ORDER — SULFAMETHOXAZOLE-TRIMETHOPRIM 800-160 MG PO TABS
1.0000 | ORAL_TABLET | Freq: Two times a day (BID) | ORAL | 0 refills | Status: AC
Start: 2023-12-23 — End: 2023-12-30

## 2023-12-23 NOTE — Progress Notes (Signed)
 Assessment: 1. Prostate cancer (HCC); PSA 5.3; GG 1,2; unfavorable intermediate risk   2. 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 Androgen deprivation therapy   4. BPH with obstruction/lower urinary tract symptoms   5. Abnormal urine findings     Plan: Continue silodosin Continue androgen deprivation therapy today with Eligard 6 month depot - next dose due after 03/21/24 Continue Nubeqa Daily vit D and calcium supplement Follow-up imaging per Dr. Myna Hidalgo Urine culture sent today Begin Bactrim DS BID x 7 days. Rx sent. Return to office in 3 months for next Eligard   Chief Complaint:  Chief Complaint  Patient presents with   Prostate Cancer    History of Present Illness:  William Phillips is a 73 y.o. male who is seen for continued evaluation of unfavorable intermediate risk prostate cancer with skeletal metastases, BPH with LUTS, and urinary retention.  He has a history of elevated PSA and BPH with LUTS. PSA results: 1/17 1.63 2/20 2.22 5/21 2.33 9/23 5.41 3/24 4.18 6/24 5.3  No prior prostate biopsy.  No history of UTIs or prostatitis.  No family history of prostate cancer. MRI of the prostate from 05/28/2023 showed a volume of 54.37 cm and PI-RADS 4 lesions in the right mid gland and apex and left apex.  He underwent fusion guided biopsy on 06/23/2023. Prostate volume measured approximately 86 g. Biopsy results: Gleason 3+4, Gleason 3+3 adenocarcinoma in 3/7 cores on left and 6/7 cores on right.  Both regions of interest were positive.  PSMA PET scan from 07/13/2023 showed avid uptake in the prostate with multifocal osseous metastases involving the posterior left iliac, right iliac, and L1 vertebral body.   He has a history of lower urinary tract symptoms for 4-5 years.  He had been on tamsulosin 0.4 mg for approximately 5 years.  He continued to have symptoms of frequency, voiding every 2 hours, occasional urgency and urge incontinence, and nocturia 2-5  times per night.  He reported voiding with a good stream.   No dysuria or gross hematuria. IPSS = 18. PVR = 181 ml.  He was given a trial of alfuzosin 10 mg daily at his visit in 3/24.  He reported difficulty voiding while on the medication and was changed to silodosin. He continued on silodosin with improvement in his urinary symptoms with the medication.  He noted some improvement in his urinary stream as well as decreased nocturia and frequency.  No dysuria or gross hematuria. IPSS = 16.   He developed urinary retention approximately 4-5 days after the biopsy.  He was seen in the emergency room and a Foley catheter was placed with return of approximately 2000 mL.   His foley was removed on 07/06/23.  He returned to the ER that evening unable to void and his foley was replaced.  He presented for a voiding trial on 07/13/23.  He was able to void only a small amount.  He was also found to have paraphimosis which was reduced.   Due to the significant penile edema, his foley was replaced. His Foley catheter was removed and he was started on intermittent catheterization on 07/16/2023. He was continued on CIC and silodosin. He was started on ADT with firmagon on 07/23/23. He has been seen by Dr. Myna Hidalgo with Oncology and started on Nubeqa.  At his visit in November 2024, he was tolerating the androgen deprivation therapy well.  No hot flashes.  He had started on Nubeqa approximately 2 weeks prior and  was tolerating this well.  He was voiding spontaneously.  He had not required intermittent catheterization for approximately 2 weeks.  He reported some frequency and intermittent stream.  No dysuria or gross hematuria. He continued on silodosin. IPSS = 17. Urine culture from 11/24:  >100K Enterococcus.  Treated with Levaquin x 7 days.  PSA 11/24: 0.48 PSA 12/24: 0.03 PSA 1/25: <0.02 PSA 2/25: <0.02  PSMA PET from 1/25 showed interval resolution of lesions in the prostate gland, no adenopathy or  visceral metastases; several skeletal metastases again demonstrated similar to prior study. He completed radiation therapy to the lumbar spine and bilateral iliac area in February 2025.  He returns today for scheduled follow-up.  He continues on androgen deprivation therapy for his metastatic prostate cancer.  He is having decreased hot flashes.  He has noted fatigue and lack of energy.  He continues on [place] and has started on Zometa.  He is on daily calcium and vitamin D supplementation. He has noted some increased urinary symptoms with frequency, urgency, and occasional urge incontinence.  He is not sure that he is emptying his bladder completely.  He continues on silodosin.  No dysuria or gross hematuria. IPSS = 22/3 today.  Portions of the above documentation were copied from a prior visit for review purposes only.   Past Medical History:  Past Medical History:  Diagnosis Date   Allergic rhinitis    Allergy 1953   Ankle fracture 1998   Asthma 1953   Atrial flutter (HCC)    a. s/p CTI ablation by Dr Johney Frame 11/2014   Back pain 08/05/2015   BPH (benign prostatic hyperplasia) 08/26/2015   Cancer (HCC) 0630/2024   Prostate Cancer   Ganglion cyst of dorsum of right wrist 04/08/2017   GERD (gastroesophageal reflux disease)    Hammer toe of right foot 05/06/2013   History of carpal tunnel syndrome    Hyperlipidemia    Hypertension    Insect bite 04/23/2015   OA (osteoarthritis) 02/06/2013   knees   Pedal edema 04/08/2017   Preventative health care 04/27/2011   Had normal colonoscopy in 2011     Past Surgical History:  Past Surgical History:  Procedure Laterality Date   A-FLUTTER ABLATION N/A 09/12/2021   Procedure: A-FLUTTER ABLATION;  Surgeon: Hillis Range, MD;  Location: [facility];  Service: Cardiovascular;  Laterality: N/A;   ABLATION  11/24/2014   CTI ablation by Dr Johney Frame   ANKLE SURGERY     ATRIAL FIBRILLATION ABLATION N/A 09/02/2022   Procedure: ATRIAL  FIBRILLATION ABLATION;  Surgeon: Maurice Small, MD;  Location: [facility] CV [facility];  Service: Cardiovascular;  Laterality: N/A;   ATRIAL FLUTTER ABLATION N/A 11/24/2014   Procedure: ATRIAL FLUTTER ABLATION;  Surgeon: Hillis Range, MD;  Location: [facility] CATH LAB;  Service: Cardiovascular;  Laterality: N/A;   CARDIOVERSION N/A 07/14/2022   Procedure: CARDIOVERSION;  Surgeon: Chrystie Nose, MD;  Location: [facility] ENDOSCOPY;  Service: Cardiovascular;  Laterality: N/A;   Carpel tunnel     COLONOSCOPY  08/04/2002   Normal Exam- Dr [name]   EYE SURGERY  1990   Radial Keratotomy   FRACTURE SURGERY  1998   Broken ankle   HERNIA REPAIR  2008   INGUINAL HERNIA REPAIR  10/13/2006   TONSILLECTOMY      Allergies:  Allergies  Allergen Reactions   Penicillins Swelling    irriation    Family History:  Family History  Problem Relation Age of Onset   Arthritis Mother  Stroke Mother    Coronary artery disease Father        aortic valve disease   Heart attack Father 59   COPD Father    Hypertension Father    Hyperlipidemia Father    Dementia Father    Cancer Sister 12       breast cancer   Diabetes Sister    Cancer Maternal Uncle        Prostate cancer   Diabetes type II Maternal Grandfather    Diabetes Maternal Grandfather    Hyperlipidemia Other    Hypertension Other     Social History:  Social History   Tobacco Use   Smoking status: Former    Current packs/day: 0.00    Average packs/day: 1 pack/day for 10.0 years (10.0 ttl pk-yrs)    Types: Cigarettes    Start date: 25    Quit date: 1989    Years since quitting: 36.2   Smokeless tobacco: Never   Tobacco comments:    5-10 pack year history  Vaping Use   Vaping status: Never Used  Substance Use Topics   Alcohol use: Yes    Alcohol/week: 10.0 standard drinks of alcohol    Types: 10 Glasses of wine per week    Comment: 1-2 drinks daily (wine)   Drug use: No    ROS: Constitutional:  Negative for fever, chills, weight  loss CV: Negative for chest pain, previous MI, hypertension Respiratory:  Negative for shortness of breath, wheezing, sleep apnea, frequent cough GI:  Negative for nausea, vomiting, bloody stool, GERD  Physical exam: BP 98/61   Pulse 77   Ht 6' (1.829 m)   Wt 210 lb (95.3 kg)   BMI 28.48 kg/m  GENERAL APPEARANCE:  Well appearing, well developed, well nourished, NAD HEENT:  Atraumatic, normocephalic, oropharynx clear NECK:  Supple without lymphadenopathy or thyromegaly ABDOMEN:  Soft, non-tender, no masses EXTREMITIES:  Moves all extremities well, without clubbing, cyanosis, or edema NEUROLOGIC:  Alert and oriented x 3, normal gait, CN II-XII grossly intact MENTAL STATUS:  appropriate BACK:  Non-tender to palpation, No CVAT SKIN:  Warm, dry, and intact  Results: U/A: >30 WBC, 3-11 RBC, many bacteria, nitrite +

## 2023-12-25 ENCOUNTER — Telehealth: Payer: Self-pay | Admitting: Radiation Oncology

## 2023-12-25 LAB — URINE CULTURE

## 2023-12-25 NOTE — Telephone Encounter (Signed)
 Returned call from pt to confirm upcoming appt date/time. Pt advised he will be able to make it and he had no additional questions.

## 2023-12-28 ENCOUNTER — Other Ambulatory Visit: Payer: Self-pay

## 2023-12-28 DIAGNOSIS — N138 Other obstructive and reflux uropathy: Secondary | ICD-10-CM

## 2023-12-29 ENCOUNTER — Ambulatory Visit (INDEPENDENT_AMBULATORY_CARE_PROVIDER_SITE_OTHER): Payer: Medicare Other | Admitting: Family Medicine

## 2023-12-29 ENCOUNTER — Encounter: Payer: Self-pay | Admitting: Family Medicine

## 2023-12-29 ENCOUNTER — Encounter: Payer: Medicare Other | Admitting: Family Medicine

## 2023-12-29 VITALS — BP 102/61 | HR 78 | Temp 97.6°F | Resp 18 | Ht 73.0 in | Wt 212.6 lb

## 2023-12-29 DIAGNOSIS — Z0000 Encounter for general adult medical examination without abnormal findings: Secondary | ICD-10-CM | POA: Diagnosis not present

## 2023-12-29 DIAGNOSIS — R7989 Other specified abnormal findings of blood chemistry: Secondary | ICD-10-CM | POA: Diagnosis not present

## 2023-12-29 DIAGNOSIS — E782 Mixed hyperlipidemia: Secondary | ICD-10-CM

## 2023-12-29 DIAGNOSIS — I10 Essential (primary) hypertension: Secondary | ICD-10-CM | POA: Diagnosis not present

## 2023-12-29 DIAGNOSIS — C61 Malignant neoplasm of prostate: Secondary | ICD-10-CM

## 2023-12-29 DIAGNOSIS — Z794 Long term (current) use of insulin: Secondary | ICD-10-CM

## 2023-12-29 DIAGNOSIS — E119 Type 2 diabetes mellitus without complications: Secondary | ICD-10-CM

## 2023-12-29 LAB — LIPID PANEL
Cholesterol: 148 mg/dL (ref 0–200)
HDL: 64.5 mg/dL (ref >39.00)
LDL Cholesterol: 42 mg/dL (ref 0–99)
NonHDL: 83.06
Total CHOL/HDL Ratio: 2
Triglycerides: 205 mg/dL — ABNORMAL HIGH (ref 0.0–149.0)
VLDL: 41 mg/dL — ABNORMAL HIGH (ref 0.0–40.0)

## 2023-12-29 LAB — MICROALBUMIN / CREATININE URINE RATIO
Creatinine,U: 74.1 mg/dL
Microalb Creat Ratio: 30.9 mg/g — ABNORMAL HIGH (ref 0.0–30.0)
Microalb, Ur: 2.3 mg/dL — ABNORMAL HIGH (ref 0.0–1.9)

## 2023-12-29 LAB — HEMOGLOBIN A1C: Hgb A1c MFr Bld: 6.7 % — ABNORMAL HIGH (ref 4.6–6.5)

## 2023-12-29 LAB — TSH: TSH: 1.18 u[IU]/mL (ref 0.35–5.50)

## 2023-12-29 MED ORDER — BLOOD GLUCOSE MONITORING SUPPL DEVI: 1.0000 | Freq: Three times a day (TID) | 0 refills | Status: AC

## 2023-12-29 MED ORDER — LANCETS MISC. MISC: 1.0000 | Freq: Three times a day (TID) | 0 refills | Status: AC

## 2023-12-29 MED ORDER — LANCET DEVICE MISC: 1.0000 | Freq: Three times a day (TID) | 0 refills | Status: AC

## 2023-12-29 MED ORDER — BLOOD GLUCOSE TEST VI STRP: 1.0000 | ORAL_STRIP | Freq: Three times a day (TID) | 0 refills | Status: AC

## 2023-12-29 NOTE — Progress Notes (Signed)
 Radiation Oncology         [phone] ________________________________  Name: William Phillips MRN: [id number]  Date: 12/31/2023  DOB: [birth year]  Follow-Up Visit Note  CC: Bradd Canary, MD  Bradd Canary, MD  No diagnosis found.  Diagnosis: The primary encounter diagnosis was Metastasis to bone [facility]). A diagnosis of Metastatic adenocarcinoma to skeletal bone [facility]) was also pertinent to this visit. 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   Interval Since Last Radiation:  1 month  6 days  First Treatment Date: 2023-11-16 Last Treatment Date: 2023-11-27   Plan Name: Spine_L1 Site: Lumbar Spine Technique: 3D Mode: Photon Dose Per Fraction: 3 Gy Prescribed Dose (Delivered / Prescribed): 30 Gy / 30 Gy Prescribed Fxs (Delivered / Prescribed): 10 / 10   Plan Name: Pelvis_R Site: [place], Right Technique: 3D Mode: Photon Dose Per Fraction: 3 Gy Prescribed Dose (Delivered / Prescribed): 30 Gy / 30 Gy Prescribed Fxs (Delivered / Prescribed): 10 / 10   Plan Name: Pelvis_L Site: [place], Left Technique: 3D Mode: Photon Dose Per Fraction: 3 Gy Prescribed Dose (Delivered / Prescribed): 30 Gy / 30 Gy Prescribed Fxs (Delivered / Prescribed): 10 / 10  Narrative:  The patient returns today for routine follow-up. He was last seen in office on 11-04-23 for his initial consult.   Since then, he completed his radiation treatment, which he tolerated quite well other than reporting developing fatigue and mild nausea and diarrhea. No other significant side effects were reported.    He presented for a follow up with Dr. Myna Hidalgo on 12-14-23. At that time, he reported feeling well and recovering from radiation with minimal side effects. He did however complain of occasional hot flashes and sweat. Zometa was stated at that time.      Patient then followed up  with Dr. Di Kindle on 12-23-23 with recommendation to continue silodosin, Nubeqa, and androgen deprivation therapy today with Eligard 6 month depot. He reported having decreased hot flashes but did note fatigue and lack of energy.   No other significant oncologic interval history since the patient was last seen.                                  Allergies:  is allergic to penicillins.  Meds: Current Outpatient Medications  Medication Sig Dispense Refill   apixaban (ELIQUIS) 5 MG TABS tablet TAKE 1 TABLET BY MOUTH TWICE  DAILY 200 tablet 1   Ascorbic Acid (VITAMIN C PO) Take 1 tablet by mouth daily.     Blood Glucose Monitoring Suppl DEVI 1 each by Does not apply route in the morning, at noon, and at bedtime. May substitute to any manufacturer covered by patient's insurance. 1 each 0   Brimonidine Tartrate (LUMIFY) 0.025 % SOLN Place 1 drop into both eyes every morning.     Calcium Carbonate-Vit D-Min (CALCIUM 1200 PO) Take 1,200 mg by mouth daily.     Carboxymethylcellulose Sodium (LUBRICANT EYE DROPS OP) Place 1 drop into both eyes 2 (two) times daily as needed (Dry eye).     Clobetasol Propionate 0.05 % shampoo Apply 1 application topically daily as needed (irritation).     clotrimazole (CLOTRIMAZOLE ANTI-FUNGAL) 1 % cream Apply 1 Application topically 2 (two) times daily. 30 g 0   darolutamide (NUBEQA) 300 MG tablet Take 2 tablets (600  mg total) by mouth 2 (two) times daily with a meal. 120 tablet 0   fexofenadine (ALLEGRA) 180 MG tablet Take 1 tablet (180 mg total) by mouth daily.     fluticasone (FLONASE) 50 MCG/ACT nasal spray Place 2 sprays into both nostrils daily as needed for allergies or rhinitis.  2   glimepiride (AMARYL) 2 MG tablet Take 1 tablet (2 mg total) by mouth daily with breakfast. 30 tablet 5   Glucose Blood (BLOOD GLUCOSE TEST STRIPS) STRP 1 each by In Vitro route in the morning, at noon, and at bedtime. May substitute to any manufacturer covered by patient's  insurance. 100 strip 0   ketoconazole (NIZORAL) 2 % shampoo Apply 1 application topically daily as needed for irritation.     Lancet Device MISC 1 each by Does not apply route in the morning, at noon, and at bedtime. May substitute to any manufacturer covered by patient's insurance. 1 each 0   Lancets Misc. MISC 1 each by Does not apply route in the morning, at noon, and at bedtime. May substitute to any manufacturer covered by patient's insurance. 100 each 0   lisinopril-hydrochlorothiazide (ZESTORETIC) 20-12.5 MG tablet TAKE 1 TABLET BY MOUTH DAILY 100 tablet 2   metoprolol succinate (TOPROL-XL) 50 MG 24 hr tablet TAKE 1 TABLET BY MOUTH TWICE  DAILY WITH OR IMMEDIATELY  FOLLOWING A MEAL 200 tablet 2   Multiple Vitamin (MULTIVITAMIN) tablet Take 1 tablet by mouth every evening.     omeprazole (PRILOSEC) 20 MG capsule TAKE 1 CAPSULE BY MOUTH DAILY 100 capsule 2   ondansetron (ZOFRAN) 4 MG tablet Take 1 tablet (4 mg total) by mouth every 8 (eight) hours as needed for nausea or vomiting. 40 tablet 3   oxyCODONE-acetaminophen (PERCOCET) 5-325 MG tablet Take 1 tablet by mouth every 6 (six) hours as needed. 10 tablet 0   rosuvastatin (CRESTOR) 5 MG tablet Take 1 tablet (5 mg total) by mouth daily. 30 tablet 5   sildenafil (REVATIO) 20 MG tablet TAKE 1 TO 4 TABLETS BY MOUTH DAILY AS NEEDED 90 tablet 0   silodosin (RAPAFLO) 8 MG CAPS capsule Take 1 capsule (8 mg total) by mouth daily with breakfast. 90 capsule 3   sulfamethoxazole-trimethoprim (BACTRIM DS) 800-160 MG tablet Take 1 tablet by mouth every 12 (twelve) hours for 7 days. 14 tablet 0   VITAMIN D PO Take 1 capsule by mouth daily. 1000 units daily.     No current facility-administered medications for this encounter.    Physical Findings: The patient is in no acute distress. Patient is alert and oriented.  vitals were not taken for this visit. .  No significant changes. Lungs are clear to auscultation bilaterally. Heart has regular rate and  rhythm. No palpable cervical, supraclavicular, or axillary adenopathy. Abdomen soft, non-tender, normal bowel sounds.   Lab Findings: Lab Results  Component Value Date   WBC 5.5 12/14/2023   HGB 14.1 12/14/2023   HCT 41.3 12/14/2023   MCV 99.5 12/14/2023   PLT 148 (L) 12/14/2023    Radiographic Findings: No results found.  Impression: Prostate cancer (HCC); Stage IVB (cT2b, cN0, pM1, PSA: 5.3, Grade Group: 3) unfavorable intermediate risk.  The patient is recovering from the effects of radiation.  ***  Plan:  ***   *** minutes of total time was spent for this patient encounter, including preparation, face-to-face counseling with the patient and coordination of care, physical exam, and documentation of the encounter. ____________________________________  Billie Lade, PhD, MD  This document serves as a record of services personally performed by Antony Blackbird, MD. It was created on his behalf by Herbie Saxon, a trained medical scribe. The creation of this record is based on the scribe's personal observations and the provider's statements to them. This document has been checked and approved by the attending provider.

## 2023-12-29 NOTE — Progress Notes (Signed)
 Complete physical exam  Patient: William Phillips   DOB: January 22, 1951   73 y.o. Male  MRN: [id number]  Subjective:    Chief Complaint  Patient presents with   Annual Exam    ([facility]) Concerns/ questions: bumps on the scalp A1C, Microalb due Foot exam is due Eye exam: scheduled with Dr Raquel Sarna is a 73 y.o. male who presents today for a complete physical exam. He reports consuming a general diet.  He is staying active, trying to walk every day.  He generally feels well. He reports sleeping well. He does not have additional problems to discuss today.   Currently lives with: wife Acute concerns or interim problems since last visit: no  Vision concerns: no, due for routine exam Dental concerns: no STD concerns: no  ETOH use: wine with dinner  Nicotine use: no Recreational drugs/illegal substances: no    Discussed the use of AI scribe software for clinical note transcription with the patient, who gave verbal consent to proceed.  History of Present Illness William Phillips "William Phillips" is a 73 year old male with prostate cancer who presents for CPE.  He is currently undergoing treatment for prostate cancer and is taking Nubeqa. He has a follow-up appointment scheduled with his urologist next week and a PET scan scheduled for May, following his last radiation treatment on November 28, 2023.  He has recently noticed a few bumps/scratches on his scalp. He manages them with hydrogen peroxide and Neosporin, which helps them resolve.   He has experienced an increase in blood glucose levels since starting Nubeqa, with recent readings around 195-200 mg/dL, up from his usual 664 mg/dL. He has been prescribed Glimepiride to manage his blood glucose but has not yet started taking it, he wanted to discuss here first.  He takes several medications for various conditions, including lisinopril, hydrochlorothiazide, and metoprolol for hypertension, rosuvastatin for hyperlipidemia,  and allergy medications like Claritin or Allegra and Flonase as needed. He also takes a combination pill containing calcium and vitamin D.  He maintains an active lifestyle, walking 30-45 minutes in his neighborhood, and consumes 1-2 glasses of wine with dinner. He quit smoking 35 years ago. He experiences occasional low energy days, which he attributes to his current treatment regimen.      Most recent fall risk assessment:    12/29/2023   10:13 AM  Fall Risk   Falls in the past year? 0  Number falls in past yr: 0  Injury with Fall? 0  Risk for fall due to : No Fall Risks  Follow up Falls evaluation completed     Most recent depression screenings:    12/29/2023   10:13 AM 11/04/2023    8:43 AM  PHQ 2/9 Scores  PHQ - 2 Score 0 0            Patient Care Team: Bradd Canary, MD as PCP - General (Family Medicine) Mealor, Roberts Gaudy, MD as PCP - Electrophysiology (Cardiology) Jens Som Madolyn Frieze, MD as PCP - Cardiology (Cardiology) Myna Hidalgo Rose Phi, MD as Medical Oncologist (Oncology) Gwendel Hanson, RN as Oncology Nurse Navigator Janet Berlin, MD as Consulting Physician (Ophthalmology)   Outpatient Medications Prior to Visit  Medication Sig   apixaban (ELIQUIS) 5 MG TABS tablet TAKE 1 TABLET BY MOUTH TWICE  DAILY   Ascorbic Acid (VITAMIN C PO) Take 1 tablet by mouth daily.   Brimonidine Tartrate (LUMIFY) 0.025 % SOLN Place 1 drop into  both eyes every morning.   Calcium Carbonate-Vit D-Min (CALCIUM 1200 PO) Take 1,200 mg by mouth daily.   Carboxymethylcellulose Sodium (LUBRICANT EYE DROPS OP) Place 1 drop into both eyes 2 (two) times daily as needed (Dry eye).   Clobetasol Propionate 0.05 % shampoo Apply 1 application topically daily as needed (irritation).   clotrimazole (CLOTRIMAZOLE ANTI-FUNGAL) 1 % cream Apply 1 Application topically 2 (two) times daily.   darolutamide (NUBEQA) 300 MG tablet Take 2 tablets (600 mg total) by mouth 2 (two) times daily with a meal.    fexofenadine (ALLEGRA) 180 MG tablet Take 1 tablet (180 mg total) by mouth daily.   fluticasone (FLONASE) 50 MCG/ACT nasal spray Place 2 sprays into both nostrils daily as needed for allergies or rhinitis.   glimepiride (AMARYL) 2 MG tablet Take 1 tablet (2 mg total) by mouth daily with breakfast.   ketoconazole (NIZORAL) 2 % shampoo Apply 1 application topically daily as needed for irritation.   lisinopril-hydrochlorothiazide (ZESTORETIC) 20-12.5 MG tablet TAKE 1 TABLET BY MOUTH DAILY   metoprolol succinate (TOPROL-XL) 50 MG 24 hr tablet TAKE 1 TABLET BY MOUTH TWICE  DAILY WITH OR IMMEDIATELY  FOLLOWING A MEAL   Multiple Vitamin (MULTIVITAMIN) tablet Take 1 tablet by mouth every evening.   omeprazole (PRILOSEC) 20 MG capsule TAKE 1 CAPSULE BY MOUTH DAILY   ondansetron (ZOFRAN) 4 MG tablet Take 1 tablet (4 mg total) by mouth every 8 (eight) hours as needed for nausea or vomiting.   oxyCODONE-acetaminophen (PERCOCET) 5-325 MG tablet Take 1 tablet by mouth every 6 (six) hours as needed.   rosuvastatin (CRESTOR) 5 MG tablet Take 1 tablet (5 mg total) by mouth daily.   sildenafil (REVATIO) 20 MG tablet TAKE 1 TO 4 TABLETS BY MOUTH DAILY AS NEEDED   silodosin (RAPAFLO) 8 MG CAPS capsule Take 1 capsule (8 mg total) by mouth daily with breakfast.   sulfamethoxazole-trimethoprim (BACTRIM DS) 800-160 MG tablet Take 1 tablet by mouth every 12 (twelve) hours for 7 days.   VITAMIN D PO Take 1 capsule by mouth daily. 1000 units daily.   No facility-administered medications prior to visit.    ROS All review of systems negative except what is listed in the HPI        Objective:     BP 102/61 (BP Location: Left Arm, Patient Position: Sitting, Cuff Size: Normal)   Pulse 78   Temp 97.6 F (36.4 C) (Oral)   Resp 18   Ht 6\' 1"  (1.854 m)   Wt 212 lb 9.6 oz (96.4 kg)   SpO2 99%   BMI 28.05 kg/m    Physical Exam Vitals reviewed.  Constitutional:      General: He is not in acute distress.     Appearance: Normal appearance. He is not ill-appearing.  HENT:     Head: Normocephalic and atraumatic.     Right Ear: Tympanic membrane normal.     Left Ear: Tympanic membrane normal.     Nose: Nose normal.     Mouth/Throat:     Mouth: Mucous membranes are moist.     Pharynx: Oropharynx is clear.  Eyes:     Extraocular Movements: Extraocular movements intact.     Conjunctiva/sclera: Conjunctivae normal.     Pupils: Pupils are equal, round, and reactive to light.  Cardiovascular:     Rate and Rhythm: Normal rate and regular rhythm.     Heart sounds: Normal heart sounds.  Pulmonary:     Effort: Pulmonary effort is  normal.     Breath sounds: Normal breath sounds.  Abdominal:     General: Abdomen is flat. Bowel sounds are normal. There is no distension.     Palpations: Abdomen is soft. There is no mass.     Tenderness: There is no abdominal tenderness. There is no right CVA tenderness, left CVA tenderness, guarding or rebound.     Comments: Ventral hernia  Genitourinary:    Comments: Deferred exam Musculoskeletal:        General: Normal range of motion.     Cervical back: Normal range of motion and neck supple. No tenderness.     Right lower leg: No edema.     Left lower leg: No edema.  Lymphadenopathy:     Cervical: No cervical adenopathy.  Skin:    General: Skin is warm and dry.     Capillary Refill: Capillary refill takes less than 2 seconds.     Comments: Scabbed abrasion/excoriation to scalp. No open wounds, drainage, or surrounding edema, erythema, heat.   Neurological:     General: No focal deficit present.     Mental Status: He is alert and oriented to person, place, and time. Mental status is at baseline.  Psychiatric:        Mood and Affect: Mood normal.        Behavior: Behavior normal.        Thought Content: Thought content normal.        Judgment: Judgment normal.        No results found for any visits on 12/29/23.     Assessment & Plan:    Routine  Health Maintenance and Physical Exam Discussed health promotion and safety including diet and exercise recommendations, dental health, and injury prevention. Tobacco cessation if applicable. Seat belts, sunscreen, smoke detectors, etc.    Immunization History  Administered Date(s) Administered   Fluad Quad(high Dose 65+) 06/29/2019   Influenza Whole 07/25/2009   Influenza, High Dose Seasonal PF 07/29/2017   Influenza,inj,Quad PF,6+ Mos 07/24/2015, 07/18/2016, 06/29/2019, 10/11/2020   Influenza-Unspecified 08/13/2018   Moderna SARS-COV2 Booster Vaccination 10/23/2020   Moderna Sars-Covid-2 Vaccination 01/01/2020, 01/29/2020   PNEUMOCOCCAL CONJUGATE-20 12/16/2022   Pneumococcal Conjugate-13 11/02/2013   Pneumococcal Polysaccharide-23 11/05/2017   Td 01/07/2005   Tdap 08/24/2015   Zoster Recombinant(Shingrix) 10/11/2020, 12/27/2020   Zoster, Live 02/04/2013    Health Maintenance  Topic Date Due   FOOT EXAM  Never done   Diabetic kidney evaluation - Urine ACR  10/18/2016   COVID-19 Vaccine (3 - Moderna risk series) 11/20/2020   OPHTHALMOLOGY EXAM  10/31/2023   HEMOGLOBIN A1C  12/16/2023   INFLUENZA VACCINE  01/11/2024 (Originally 05/14/2023)   Medicare Annual Wellness (AWV)  10/12/2024   Fecal DNA (Cologuard)  10/16/2024   Diabetic kidney evaluation - eGFR measurement  12/13/2024   DTaP/Tdap/Td (3 - Td or Tdap) 08/23/2025   Pneumonia Vaccine 38+ Years old  Completed   Hepatitis C Screening  Completed   Zoster Vaccines- Shingrix  Completed   HPV VACCINES  Aged Out   Colonoscopy  Discontinued        Problem List Items Addressed This Visit       Active Problems   Hyperlipidemia, mixed   Continue current meds and lifestyle measures.  Labs today - not fasting.       Relevant Orders   Lipid panel   Essential hypertension   Stable. Continue current meds and lifestyle measures.       Controlled type 2 diabetes  mellitus without complication, without long-term current use  of insulin (HCC)   Recent high glucose - advise he start the glimepiride previously prescribed. A1c today. Will send in glucometer so he can monitor glucose more closely.       Relevant Medications   Blood Glucose Monitoring Suppl DEVI   Glucose Blood (BLOOD GLUCOSE TEST STRIPS) STRP   Lancet Device MISC   Lancets Misc. MISC   Other Relevant Orders   Microalbumin / creatinine urine ratio   HgB A1c   Abnormal TSH   Labs today      Relevant Orders   TSH   Prostate cancer (HCC); PSA 5.3; GG 1,2; unfavorable intermediate risk   No acute concerns. Following with urology and oncology. Due for repeat imaging in a month or two.      Other Visit Diagnoses       Annual physical exam    -  Primary   Relevant Orders   Lipid panel   TSH      Routine labs recently checked with oncology; regular follow-up.    PATIENT COUNSELING:     Sexuality: Discussed sexually transmitted diseases, partner selection, use of condoms, avoidance of unintended pregnancy, and contraceptive alternatives.    I discussed with the patient that most people either abstain from alcohol or drink within safe limits (<=14/week and <=4 drinks/occasion for males, <=7/weeks and <= 3 drinks/occasion for females) and that the risk for alcohol disorders and other health effects rises proportionally with the number of drinks per week and how often a drinker exceeds daily limits.  Discussed cessation/primary prevention of drug use and availability of treatment for abuse.   Diet: Encouraged to adjust caloric intake to maintain or achieve ideal body weight, to reduce intake of dietary saturated fat and total fat, to limit sodium intake by avoiding high sodium foods and not adding table salt, and to maintain adequate dietary potassium and calcium preferably from fresh fruits, vegetables, and low-fat dairy products. Encouraged vitamin D 1000 units and Calcium 1300mg  or 4 servings of dairy a day.  Emphasized the importance of  regular exercise.  Injury prevention: Discussed safety belts, safety helmets, smoke detector, smoking near bedding or upholstery.   Dental health: Discussed importance of regular tooth brushing, flossing, and dental visits.       Return for routine follow-up with PCP 3-4 months.     Clayborne Dana, NP

## 2023-12-29 NOTE — Assessment & Plan Note (Signed)
 Recent high glucose - advise he start the glimepiride previously prescribed. A1c today. Will send in glucometer so he can monitor glucose more closely.

## 2023-12-29 NOTE — Assessment & Plan Note (Signed)
 Continue current meds and lifestyle measures.  Labs today - not fasting.

## 2023-12-29 NOTE — Assessment & Plan Note (Signed)
 Stable. Continue current meds and lifestyle measures.

## 2023-12-29 NOTE — Assessment & Plan Note (Signed)
 No acute concerns. Following with urology and oncology. Due for repeat imaging in a month or two.

## 2023-12-29 NOTE — Assessment & Plan Note (Signed)
 Labs today

## 2023-12-30 ENCOUNTER — Encounter: Payer: Self-pay | Admitting: Family Medicine

## 2023-12-30 NOTE — Progress Notes (Signed)
 Triglycerides are a little higher this time, but you were not fasting. Continue focusing on heart healthy lifestyle.  Microalbumin/creatinine ratio is slightly increased. Already on lisinopril which will help protect your kidneys. Make sure you are staying well hydrated. We will keep an eye on this.   A1c is stable.

## 2023-12-31 ENCOUNTER — Encounter: Payer: Self-pay | Admitting: Radiation Oncology

## 2023-12-31 ENCOUNTER — Ambulatory Visit
Admission: RE | Admit: 2023-12-31 | Discharge: 2023-12-31 | Disposition: A | Discharge status: Home / Self Care | Payer: Medicare Other | Source: Ambulatory Visit | Attending: Radiation Oncology | Admitting: Radiation Oncology

## 2023-12-31 VITALS — BP 125/57 | HR 84 | Temp 96.4°F | Resp 18 | Ht 73.0 in | Wt 212.8 lb

## 2023-12-31 DIAGNOSIS — Z7984 Long term (current) use of oral hypoglycemic drugs: Secondary | ICD-10-CM | POA: Insufficient documentation

## 2023-12-31 DIAGNOSIS — C61 Malignant neoplasm of prostate: Secondary | ICD-10-CM | POA: Insufficient documentation

## 2023-12-31 DIAGNOSIS — Z923 Personal history of irradiation: Secondary | ICD-10-CM | POA: Diagnosis not present

## 2023-12-31 DIAGNOSIS — Z7901 Long term (current) use of anticoagulants: Secondary | ICD-10-CM | POA: Diagnosis not present

## 2023-12-31 DIAGNOSIS — Z79899 Other long term (current) drug therapy: Secondary | ICD-10-CM | POA: Diagnosis not present

## 2023-12-31 DIAGNOSIS — C7951 Secondary malignant neoplasm of bone: Secondary | ICD-10-CM | POA: Insufficient documentation

## 2023-12-31 HISTORY — DX: Personal history of irradiation: Z92.3

## 2023-12-31 NOTE — Progress Notes (Signed)
     William Phillips is here today for follow up post radiation to the pelvis and lumbar spine. Completed radiation on: 11/27/23  Does the patient complain of any of the following:  Diarrhea/Constipation: He reports having constipation but taking Doculax. Nausea/Vomiting: Occasional nausea Blood in Urine or Stool: Denies Urinary Issues (dysuria/incomplete emptying/ incontinence/ increased frequency/urgency): Denies Numbness or weakness in extremities  Post radiation skin changes: Joint Pain/ Swelling: Denies Appetite good/fair/poor: Good       Post radiation skin changes: Denies Numbness or weakness in extremities: Denies    BP (!) 125/57 (BP Location: Left Arm, Patient Position: Sitting)   Pulse 84   Temp (!) 96.4 F (35.8 C) (Temporal)   Resp 18   Ht 6\' 1"  (1.854 m)   Wt 186 lb 6 oz (84.5 kg)   SpO2 100%   BMI 24.59 kg/m

## 2024-01-03 ENCOUNTER — Other Ambulatory Visit: Payer: Self-pay | Admitting: Cardiovascular Disease

## 2024-01-03 DIAGNOSIS — I4892 Unspecified atrial flutter: Secondary | ICD-10-CM

## 2024-01-04 NOTE — Telephone Encounter (Signed)
 Prescription refill request for Eliquis received. Indication: AF Last office visit: 08/12/23  B Crenshaw MD Scr: 0.90 on 12/14/23  Epic Age: 73 Weight: 96.2kg  Based on above findings Eliquis 5mg  twice daily is the appropriate dose.  Refill approved.

## 2024-01-08 ENCOUNTER — Encounter: Payer: Self-pay | Admitting: Hematology & Oncology

## 2024-01-11 ENCOUNTER — Other Ambulatory Visit

## 2024-01-11 DIAGNOSIS — N138 Other obstructive and reflux uropathy: Secondary | ICD-10-CM

## 2024-01-11 LAB — URINALYSIS, ROUTINE W REFLEX MICROSCOPIC
Bilirubin, UA: NEGATIVE
Nitrite, UA: NEGATIVE
Specific Gravity, UA: 1.02 (ref 1.005–1.030)
Urobilinogen, Ur: 0.2 mg/dL (ref 0.2–1.0)
pH, UA: 7 (ref 5.0–7.5)

## 2024-01-11 LAB — MICROSCOPIC EXAMINATION

## 2024-01-12 ENCOUNTER — Encounter: Payer: Self-pay | Admitting: Urology

## 2024-01-13 DIAGNOSIS — H04123 Dry eye syndrome of bilateral lacrimal glands: Secondary | ICD-10-CM | POA: Diagnosis not present

## 2024-01-13 DIAGNOSIS — H2513 Age-related nuclear cataract, bilateral: Secondary | ICD-10-CM | POA: Diagnosis not present

## 2024-01-13 DIAGNOSIS — H25013 Cortical age-related cataract, bilateral: Secondary | ICD-10-CM | POA: Diagnosis not present

## 2024-01-13 DIAGNOSIS — D3131 Benign neoplasm of right choroid: Secondary | ICD-10-CM | POA: Diagnosis not present

## 2024-01-13 DIAGNOSIS — H43813 Vitreous degeneration, bilateral: Secondary | ICD-10-CM | POA: Diagnosis not present

## 2024-01-18 ENCOUNTER — Other Ambulatory Visit: Payer: Self-pay | Admitting: Cardiology

## 2024-01-19 ENCOUNTER — Other Ambulatory Visit: Payer: Self-pay

## 2024-01-19 DIAGNOSIS — C61 Malignant neoplasm of prostate: Secondary | ICD-10-CM

## 2024-01-19 MED ORDER — NUBEQA 300 MG PO TABS: 600.0000 mg | ORAL_TABLET | Freq: Two times a day (BID) | ORAL | 0 refills | Status: DC

## 2024-01-22 ENCOUNTER — Other Ambulatory Visit: Payer: Self-pay | Admitting: Family Medicine

## 2024-02-01 ENCOUNTER — Ambulatory Visit: Admitting: Hematology & Oncology

## 2024-02-01 ENCOUNTER — Inpatient Hospital Stay

## 2024-02-12 ENCOUNTER — Other Ambulatory Visit: Payer: Self-pay | Admitting: *Deleted

## 2024-02-12 DIAGNOSIS — C61 Malignant neoplasm of prostate: Secondary | ICD-10-CM

## 2024-02-12 MED ORDER — NUBEQA 300 MG PO TABS: 600.0000 mg | ORAL_TABLET | Freq: Two times a day (BID) | ORAL | 0 refills | Status: DC

## 2024-02-15 ENCOUNTER — Ambulatory Visit: Payer: Self-pay

## 2024-02-15 ENCOUNTER — Ambulatory Visit (INDEPENDENT_AMBULATORY_CARE_PROVIDER_SITE_OTHER): Admitting: Internal Medicine

## 2024-02-15 ENCOUNTER — Ambulatory Visit (HOSPITAL_BASED_OUTPATIENT_CLINIC_OR_DEPARTMENT_OTHER)
Admission: RE | Admit: 2024-02-15 | Discharge: 2024-02-15 | Disposition: A | Discharge status: Home / Self Care | Source: Ambulatory Visit | Attending: Internal Medicine | Admitting: Internal Medicine

## 2024-02-15 ENCOUNTER — Encounter (HOSPITAL_COMMUNITY)

## 2024-02-15 VITALS — BP 96/58 | HR 50 | Temp 97.8°F | Resp 16 | Ht 73.0 in | Wt 213.4 lb

## 2024-02-15 DIAGNOSIS — I672 Cerebral atherosclerosis: Secondary | ICD-10-CM | POA: Diagnosis not present

## 2024-02-15 DIAGNOSIS — Z7901 Long term (current) use of anticoagulants: Secondary | ICD-10-CM | POA: Diagnosis not present

## 2024-02-15 DIAGNOSIS — G44319 Acute post-traumatic headache, not intractable: Secondary | ICD-10-CM | POA: Diagnosis not present

## 2024-02-15 DIAGNOSIS — G44309 Post-traumatic headache, unspecified, not intractable: Secondary | ICD-10-CM | POA: Diagnosis not present

## 2024-02-15 DIAGNOSIS — I10 Essential (primary) hypertension: Secondary | ICD-10-CM

## 2024-02-15 DIAGNOSIS — W19XXXA Unspecified fall, initial encounter: Secondary | ICD-10-CM

## 2024-02-15 DIAGNOSIS — S0990XD Unspecified injury of head, subsequent encounter: Secondary | ICD-10-CM | POA: Diagnosis not present

## 2024-02-15 DIAGNOSIS — I6782 Cerebral ischemia: Secondary | ICD-10-CM | POA: Diagnosis not present

## 2024-02-15 NOTE — Telephone Encounter (Signed)
 Copied from CRM [id number]. Topic: Clinical - Red Word Triage >> Feb 15, 2024  8:27 AM Rosamond Comes wrote: Red Word that prompted transfer to Nurse Triage: Patient wife [name] calling in. Patient fell around April 14. Middle of night, hit his head on the floor, now is having headaches more frequent.   Chief Complaint: Headaches  Symptoms: Headaches  Frequency: Since One Week after April 14th  Pertinent Negatives: Patient denies visual changes, nausea, and vomiting.   Disposition: [] ED /[] Urgent Care (no appt availability in office) / [x] Appointment(In office/virtual)/ []  [facility]/ [] Home Care/ [] Refused Recommended Disposition /[] [facility]/ []  Follow-up with PCP Additional Notes: Spoke with [name], wife, for triage. The patient fell previously in April. Has not been evalutated since the fall, and the patient is now having headaches more frequently than usual. In office appointment made for today.   Reason for Disposition  [1] New headache AND [2] age > 35  Answer Assessment - Initial Assessment Questions 1. LOCATION: "Where does it hurt?"      Right side, around the right eye  2. ONSET: "When did the headache start?" (Minutes, hours or days)      One Week after the fall  3. PATTERN: "Does the pain come and go, or has it been constant since it started?"     Intermittent  4. SEVERITY: "How bad is the pain?" and "What does it keep you from doing?"  (e.g., Scale 1-10; mild, moderate, or severe)   - MILD (1-3): doesn't interfere with normal activities    - MODERATE (4-7): interferes with normal activities or awakens from sleep    - SEVERE (8-10): excruciating pain, unable to do any normal activities        Mild to Moderate  5. RECURRENT SYMPTOM: "Have you ever had headaches before?" If Yes, ask: "When was the last time?" and "What happened that time?"      No  6. CAUSE: "What do you think is causing the headache?"     Unsure  7. MIGRAINE: "Have you been diagnosed  with migraine headaches?" If Yes, ask: "Is this headache similar?"      No  8. HEAD INJURY: "Has there been any recent injury to the head?"      No  9. OTHER SYMPTOMS: "Do you have any other symptoms?" (fever, stiff neck, eye pain, sore throat, cold symptoms)     No  Protocols used: Headache-A-AH

## 2024-02-15 NOTE — Telephone Encounter (Signed)
 Pt has an appointment today

## 2024-02-15 NOTE — Progress Notes (Unsigned)
 Subjective:    Patient ID: William Phillips, male    DOB: [birth year], 73 y.o.   MRN: [id number]  DOS:  02/15/2024 Type of visit - description: acute  3 weeks ago, woke up at night, went to the bathroom, had a mechanical fall. The impact was mostly at the left side of his face.  No LOC, no chest pain, difficulty breathing or palpitations.  2 weeks ago started to develop a headache, mostly located at the right side of the face and head. Admits to occasional nausea but that is going on even before the fall. No neck pain, perhaps it is slightly stiff. Denies any rash.  BP is noted to be low, no recent ambulatory BPs.  BP Readings from Last 3 Encounters:  02/15/24 (!) 96/58  12/31/23 (!) 125/57  12/29/23 102/61     Review of Systems See above   Past Medical History:  Diagnosis Date   Allergic rhinitis    Allergy 1953   Ankle fracture 1998   Asthma 1953   Atrial flutter (HCC)    a. s/p CTI ablation by Dr Nunzio Belch 11/2014   Back pain 08/05/2015   BPH (benign prostatic hyperplasia) 08/26/2015   Cancer (HCC) 0630/2024   Prostate Cancer   Ganglion cyst of dorsum of right wrist 04/08/2017   GERD (gastroesophageal reflux disease)    Hammer toe of right foot 05/06/2013   History of carpal tunnel syndrome    History of radiation therapy    Dr. Retta Caster 11/16/2023-11/27/2023   Hyperlipidemia    Hypertension    Insect bite 04/23/2015   OA (osteoarthritis) 02/06/2013   knees   Pedal edema 04/08/2017   Preventative health care 04/27/2011   Had normal colonoscopy in 2011     Past Surgical History:  Procedure Laterality Date   A-FLUTTER ABLATION N/A 09/12/2021   Procedure: A-FLUTTER ABLATION;  Surgeon: Jolly Needle, MD;  Location: [facility];  Service: Cardiovascular;  Laterality: N/A;   ABLATION  11/24/2014   CTI ablation by Dr Nunzio Belch   ANKLE SURGERY     ATRIAL FIBRILLATION ABLATION N/A 09/02/2022   Procedure: ATRIAL FIBRILLATION ABLATION;  Surgeon: Efraim Grange, MD;  Location: [facility];  Service: Cardiovascular;  Laterality: N/A;   ATRIAL FLUTTER ABLATION N/A 11/24/2014   Procedure: ATRIAL FLUTTER ABLATION;  Surgeon: Jolly Needle, MD;  Location: [facility] CATH LAB;  Service: Cardiovascular;  Laterality: N/A;   CARDIOVERSION N/A 07/14/2022   Procedure: CARDIOVERSION;  Surgeon: Hazle Lites, MD;  Location: [facility] ENDOSCOPY;  Service: Cardiovascular;  Laterality: N/A;   Carpel tunnel     COLONOSCOPY  08/04/2002   Normal Exam- Dr Elvin Hammer   EYE SURGERY  1990   Radial Keratotomy   FRACTURE SURGERY  1998   Broken ankle   HERNIA REPAIR  2008   INGUINAL HERNIA REPAIR  10/13/2006   TONSILLECTOMY      Current Outpatient Medications  Medication Instructions   Ascorbic Acid (VITAMIN C PO) 1 tablet, Daily   Blood Glucose Monitoring Suppl DEVI 1 each, Does not apply, 3 times daily, May substitute to any manufacturer covered by patient's insurance.   Brimonidine Tartrate (LUMIFY) 0.025 % SOLN 1 drop, BH-each morning   Calcium  Carbonate-Vit D-Min (CALCIUM  1200 PO) 1,200 mg, Daily   Carboxymethylcellulose Sodium (LUBRICANT EYE DROPS OP) 1 drop, 2 times daily PRN   Clobetasol Propionate 0.05 % shampoo 1 application , Daily PRN   clotrimazole  (CLOTRIMAZOLE  ANTI-FUNGAL) 1 % cream 1 Application, Topical,  2 times daily   Eliquis  5 mg, Oral, 2 times daily   fexofenadine  (ALLEGRA ) 180 mg, Oral, Daily   fluticasone  (FLONASE ) 50 MCG/ACT nasal spray 2 sprays, Each Nare, Daily PRN   glimepiride  (AMARYL ) 2 mg, Oral, Daily with breakfast   ketoconazole (NIZORAL) 2 % shampoo 1 application , Daily PRN   lisinopril -hydrochlorothiazide  (ZESTORETIC ) 20-12.5 MG tablet 1 tablet, Oral, Daily   metoprolol  succinate (TOPROL -XL) 50 MG 24 hr tablet TAKE 1 TABLET BY MOUTH TWICE  DAILY WITH OR IMMEDIATELY  FOLLOWING A MEAL   Multiple Vitamin (MULTIVITAMIN) tablet 1 tablet, Every evening   Nubeqa  600 mg, Oral, 2 times daily with meals   omeprazole  (PRILOSEC) 20 mg, Oral,  Daily   ondansetron  (ZOFRAN ) 4 mg, Oral, Every 8 hours PRN   oxyCODONE -acetaminophen  (PERCOCET) 5-325 MG tablet 1 tablet, Oral, Every 6 hours PRN   rosuvastatin  (CRESTOR ) 5 mg, Oral, Daily   sildenafil  (REVATIO ) 20 MG tablet TAKE 1 TO 4 TABLETS BY MOUTH DAILY AS NEEDED   silodosin  (RAPAFLO ) 8 mg, Oral, Daily with breakfast   VITAMIN D PO 1 capsule, Daily       Objective:   Physical Exam BP (!) 96/58   Pulse (!) 50   Temp 97.8 F (36.6 C) (Oral)   Resp 16   Ht 6\' 1"  (1.854 m)   Wt 213 lb 6 oz (96.8 kg)   SpO2 97%   BMI 28.15 kg/m  General:   Well developed, NAD, BMI noted. HEENT:  Normocephalic . Face symmetric, atraumatic Neck: Range of motion seems normal, no TTP at the cervical spine.  Lungs:  CTA B Normal respiratory effort, no intercostal retractions, no accessory muscle use. Heart: RRR,  no murmur.  Lower extremities: no pretibial edema bilaterally  Skin: Not pale. Not jaundice Neurologic:  alert & oriented X3.  Speech normal, gait appropriate for age and unassisted.  Motor symmetric.  Gait normal MRI Psych--  Cognition and judgment appear intact.  Cooperative with normal attention span and concentration.  Behavior appropriate. No anxious or depressed appearing.      Assessment     73 year old male, PMH includes DJD, BPH, HTN, DM, high cholesterol, A-flutter, asthma, prostate cancer, anticoagulated, presents with:  Fall, initial encounter: Reports a mechanical fall 3 weeks ago, he tripped and fell, apparently not related to slightly low BP. Headache: Developed a week after a fall with a facial impact.  He is anticoagulated, neurological exam is benign.   Plan: Head CT stat.  If negative observation, but he is encouraged to call if the headache persist or come back even if the CT is negative. HTN: It is low today, denies dizziness or orthostatic symptoms.  Continue metoprolol , Zestoretic , monitor BP, call if BP is consistently low, see AVS

## 2024-02-15 NOTE — Patient Instructions (Addendum)
 Proceed with a CT of your head.  If it is normal, will wait for few days, if the headache goes away,no further evaluation if needed.  If the headache does not go away, get worse, you do have nausea or feel worse: Call immediately.  (Even if your CT comes back normal).   Check the  blood pressure regularly Blood pressure goal:  between 110/65 and  135/85. If it is consistently higher or lower, let me know

## 2024-02-17 DIAGNOSIS — Z85828 Personal history of other malignant neoplasm of skin: Secondary | ICD-10-CM | POA: Diagnosis not present

## 2024-02-17 DIAGNOSIS — Z129 Encounter for screening for malignant neoplasm, site unspecified: Secondary | ICD-10-CM | POA: Diagnosis not present

## 2024-02-17 DIAGNOSIS — L2089 Other atopic dermatitis: Secondary | ICD-10-CM | POA: Diagnosis not present

## 2024-02-17 DIAGNOSIS — L738 Other specified follicular disorders: Secondary | ICD-10-CM | POA: Diagnosis not present

## 2024-02-17 DIAGNOSIS — L218 Other seborrheic dermatitis: Secondary | ICD-10-CM | POA: Diagnosis not present

## 2024-02-17 DIAGNOSIS — L720 Epidermal cyst: Secondary | ICD-10-CM | POA: Diagnosis not present

## 2024-02-17 DIAGNOSIS — D1801 Hemangioma of skin and subcutaneous tissue: Secondary | ICD-10-CM | POA: Diagnosis not present

## 2024-02-19 ENCOUNTER — Telehealth: Payer: Self-pay | Admitting: Cardiology

## 2024-02-19 NOTE — Telephone Encounter (Addendum)
 Spoke with patient and he states he saw two of his providers recently and his BP has been low both times SBP in the low 90's. His oncologist feels his BP medications need adjusting and advised he needs to speak with his cardiologist. He states he has not other symptoms but sometimes he has low energy.  Below are some readings he has. Will forward to provider  5/5: 100/71 5/6: 106/71 5/7: 84/59 5/8: 136/82 5/8: 142/87  5/8: 94/68 5/9: 138/86

## 2024-02-19 NOTE — Telephone Encounter (Signed)
 Pt c/o BP issue: STAT if pt c/o blurred vision, one-sided weakness or slurred speech.  STAT if BP is GREATER than 180/120 TODAY.  STAT if BP is LESS than 90/60 and SYMPTOMATIC TODAY  1. What is your BP concern? Pt called in concerned that his BP has been fluctuating up and down, please advise.   2. Have you taken any BP medication today? Yes   3. What are your last 5 BP readings?  5/5: 100/71 5/6: 106/71 5/7: 84/59 5/8: 136/82 5/8: 142/87  5/8: 94/68 5/9: 138/86   4. Are you having any other symptoms (ex. Dizziness, headache, blurred vision, passed out)? No

## 2024-02-22 MED ORDER — LISINOPRIL 10 MG PO TABS: 10.0000 mg | ORAL_TABLET | Freq: Every day | ORAL | 3 refills | Status: DC

## 2024-02-22 NOTE — Telephone Encounter (Signed)
Spoke with pt, Aware of dr crenshaw's recommendations. New script sent to the pharmacy  

## 2024-02-25 ENCOUNTER — Ambulatory Visit (HOSPITAL_COMMUNITY)
Admission: RE | Admit: 2024-02-25 | Discharge: 2024-02-25 | Disposition: A | Discharge status: Home / Self Care | Source: Ambulatory Visit | Attending: Hematology & Oncology | Admitting: Hematology & Oncology

## 2024-02-25 DIAGNOSIS — C7951 Secondary malignant neoplasm of bone: Secondary | ICD-10-CM

## 2024-02-25 MED ORDER — FLOTUFOLASTAT F 18 GALLIUM 296-5846 MBQ/ML IV SOLN
8.3500 | Freq: Once | INTRAVENOUS | Status: AC
  Administered 2024-02-25: 8.35 via INTRAVENOUS

## 2024-03-03 ENCOUNTER — Encounter: Payer: Self-pay | Admitting: *Deleted

## 2024-03-03 NOTE — Progress Notes (Signed)
 Reviewed PET which shows stable disease.   Oncology Nurse Navigator Documentation     03/03/2024    3:15 PM  Oncology Nurse Navigator Flowsheets  Navigator Follow Up Date: 03/08/2024  Navigator Follow Up Reason: Follow-up Appointment  Navigator Location [facility]  Navigator Encounter Type Scan Review  Patient Visit Type MedOnc  Treatment Phase Active Tx  Barriers/Navigation Needs Coordination of Care  Interventions None Required  Acuity Level 1-No Barriers  Support Groups/Services Friends and Family  Time Spent with Patient 15

## 2024-03-07 ENCOUNTER — Encounter: Payer: Self-pay | Admitting: Urology

## 2024-03-08 ENCOUNTER — Encounter: Payer: Self-pay | Admitting: Hematology & Oncology

## 2024-03-08 ENCOUNTER — Other Ambulatory Visit: Payer: Self-pay

## 2024-03-08 ENCOUNTER — Other Ambulatory Visit

## 2024-03-08 ENCOUNTER — Inpatient Hospital Stay (HOSPITAL_BASED_OUTPATIENT_CLINIC_OR_DEPARTMENT_OTHER): Admitting: Hematology & Oncology

## 2024-03-08 ENCOUNTER — Inpatient Hospital Stay

## 2024-03-08 ENCOUNTER — Telehealth: Payer: Self-pay

## 2024-03-08 ENCOUNTER — Encounter: Payer: Self-pay | Admitting: *Deleted

## 2024-03-08 ENCOUNTER — Inpatient Hospital Stay: Attending: Hematology & Oncology

## 2024-03-08 VITALS — BP 122/64 | HR 65 | Temp 98.1°F | Resp 18 | Ht 72.0 in | Wt 221.0 lb

## 2024-03-08 DIAGNOSIS — Z923 Personal history of irradiation: Secondary | ICD-10-CM | POA: Diagnosis not present

## 2024-03-08 DIAGNOSIS — C61 Malignant neoplasm of prostate: Secondary | ICD-10-CM | POA: Insufficient documentation

## 2024-03-08 DIAGNOSIS — C7951 Secondary malignant neoplasm of bone: Secondary | ICD-10-CM | POA: Diagnosis not present

## 2024-03-08 DIAGNOSIS — M25552 Pain in left hip: Secondary | ICD-10-CM | POA: Diagnosis not present

## 2024-03-08 DIAGNOSIS — R339 Retention of urine, unspecified: Secondary | ICD-10-CM

## 2024-03-08 DIAGNOSIS — R232 Flushing: Secondary | ICD-10-CM | POA: Diagnosis not present

## 2024-03-08 DIAGNOSIS — M25551 Pain in right hip: Secondary | ICD-10-CM | POA: Diagnosis not present

## 2024-03-08 LAB — CBC WITH DIFFERENTIAL (CANCER CENTER ONLY)
Abs Immature Granulocytes: 0.02 10*3/uL (ref 0.00–0.07)
Basophils Absolute: 0 10*3/uL (ref 0.0–0.1)
Basophils Relative: 0 %
Eosinophils Absolute: 0.3 10*3/uL (ref 0.0–0.5)
Eosinophils Relative: 6 %
HCT: 39.1 % (ref 39.0–52.0)
Hemoglobin: 12.9 g/dL — ABNORMAL LOW (ref 13.0–17.0)
Immature Granulocytes: 0 %
Lymphocytes Relative: 20 %
Lymphs Abs: 1.1 10*3/uL (ref 0.7–4.0)
MCH: 34.1 pg — ABNORMAL HIGH (ref 26.0–34.0)
MCHC: 33 g/dL (ref 30.0–36.0)
MCV: 103.4 fL — ABNORMAL HIGH (ref 80.0–100.0)
Monocytes Absolute: 0.5 10*3/uL (ref 0.1–1.0)
Monocytes Relative: 10 %
Neutro Abs: 3.4 10*3/uL (ref 1.7–7.7)
Neutrophils Relative %: 64 %
Platelet Count: 149 10*3/uL — ABNORMAL LOW (ref 150–400)
RBC: 3.78 MIL/uL — ABNORMAL LOW (ref 4.22–5.81)
RDW: 12.8 % (ref 11.5–15.5)
WBC Count: 5.3 10*3/uL (ref 4.0–10.5)
nRBC: 0 % (ref 0.0–0.2)

## 2024-03-08 LAB — URINALYSIS, COMPLETE (UACMP) WITH MICROSCOPIC
Bilirubin Urine: NEGATIVE
Glucose, UA: NEGATIVE mg/dL
Hgb urine dipstick: NEGATIVE
Ketones, ur: NEGATIVE mg/dL
Nitrite: NEGATIVE
Protein, ur: NEGATIVE mg/dL
Specific Gravity, Urine: 1.015 (ref 1.005–1.030)
pH: 5.5 (ref 5.0–8.0)

## 2024-03-08 LAB — CMP (CANCER CENTER ONLY)
ALT: 20 U/L (ref 0–44)
AST: 24 U/L (ref 15–41)
Albumin: 4.5 g/dL (ref 3.5–5.0)
Alkaline Phosphatase: 45 U/L (ref 38–126)
Anion gap: 7 (ref 5–15)
BUN: 11 mg/dL (ref 8–23)
CO2: 29 mmol/L (ref 22–32)
Calcium: 9.8 mg/dL (ref 8.9–10.3)
Chloride: 103 mmol/L (ref 98–111)
Creatinine: 0.78 mg/dL (ref 0.61–1.24)
GFR, Estimated: >60 mL/min (ref >60)
Glucose, Bld: 202 mg/dL — ABNORMAL HIGH (ref 70–99)
Potassium: 4.9 mmol/L (ref 3.5–5.1)
Sodium: 139 mmol/L (ref 135–145)
Total Bilirubin: 0.9 mg/dL (ref 0.0–1.2)
Total Protein: 6.7 g/dL (ref 6.5–8.1)

## 2024-03-08 NOTE — Progress Notes (Signed)
 Hematology and Oncology Follow Up Visit  William Phillips [id number] 01-Dec-1950 73 y.o. 03/08/2024   Principle Diagnosis:  Metastatic prostate cancer-castrate sensitive-oligometastatic  Current Therapy:   Eligard  45 mg IM every 6 months next dose 03/2024 (Urology) Nubeqa  600 mg p.o. twice daily-started on 07/30/2023 Zometa  4 mg IV every 3 months -next on 03/2024  Status post radiotherapy to right/left iliac and L1    Interim History:  William Phillips is back for follow-up.  He is doing okay.  We last saw him back on 12/14/2023.  At that time, his PSA was less than 0.1.  His testosterone  level was 21.  He did have radiotherapy to the spine.  This was at L1.  He also had radiotherapy to the right and left ilium.  We did go ahead and do a PET scan on him.  The PET scan was done on 02/25/2024.  It still shows that there is some activity in his bones.  He clearly has only go metastatic disease.  The activity level however is lessened.  He does have occasional pain in, mostly at nighttime in the hips.  He does have some hot flashes.  He is eating okay.  He has had no problems with nausea or vomiting.  He has gained a little bit of weight.  He and his wife are going down to [facility].  I think they are going this weekend.  We will do his Zometa  when he gets back.  He has had no nausea or vomiting.  There is been no cough or shortness of breath.  He has had no rashes.  Is been no leg swelling.  He has had no fever.  He has had no headache.  Overall, I would have to say that his performance status is probably ECOG 1.    Medications:  Current Outpatient Medications:    apixaban  (ELIQUIS ) 5 MG TABS tablet, TAKE 1 TABLET BY MOUTH TWICE  DAILY, Disp: 200 tablet, Rfl: 1   Ascorbic Acid (VITAMIN C PO), Take 1 tablet by mouth daily., Disp: , Rfl:    Blood Glucose Monitoring Suppl DEVI, 1 each by Does not apply route in the morning, at noon, and at bedtime. May substitute to any manufacturer  covered by patient's insurance., Disp: 1 each, Rfl: 0   Brimonidine Tartrate (LUMIFY) 0.025 % SOLN, Place 1 drop into both eyes every morning., Disp: , Rfl:    Calcium  Carbonate-Vit D-Min (CALCIUM  1200 PO), Take 1,200 mg by mouth daily., Disp: , Rfl:    Carboxymethylcellulose Sodium (LUBRICANT EYE DROPS OP), Place 1 drop into both eyes 2 (two) times daily as needed (Dry eye)., Disp: , Rfl:    Clobetasol Propionate 0.05 % shampoo, Apply 1 application topically daily as needed (irritation)., Disp: , Rfl:    clotrimazole  (CLOTRIMAZOLE  ANTI-FUNGAL) 1 % cream, Apply 1 Application topically 2 (two) times daily. (Patient not taking: Reported on 02/15/2024), Disp: 30 g, Rfl: 0   darolutamide  (NUBEQA ) 300 MG tablet, Take 2 tablets (600 mg total) by mouth 2 (two) times daily with a meal., Disp: 120 tablet, Rfl: 0   fexofenadine  (ALLEGRA ) 180 MG tablet, Take 1 tablet (180 mg total) by mouth daily., Disp: , Rfl:    fluticasone  (FLONASE ) 50 MCG/ACT nasal spray, Place 2 sprays into both nostrils daily as needed for allergies or rhinitis., Disp: , Rfl: 2   glimepiride  (AMARYL ) 2 MG tablet, Take 1 tablet (2 mg total) by mouth daily with breakfast., Disp: 30 tablet, Rfl: 5  ketoconazole (NIZORAL) 2 % shampoo, Apply 1 application topically daily as needed for irritation., Disp: , Rfl:    lisinopril  (ZESTRIL ) 10 MG tablet, Take 1 tablet (10 mg total) by mouth daily., Disp: 90 tablet, Rfl: 3   metoprolol  succinate (TOPROL -XL) 50 MG 24 hr tablet, TAKE 1 TABLET BY MOUTH TWICE  DAILY WITH OR IMMEDIATELY  FOLLOWING A MEAL, Disp: 200 tablet, Rfl: 2   Multiple Vitamin (MULTIVITAMIN) tablet, Take 1 tablet by mouth every evening., Disp: , Rfl:    omeprazole  (PRILOSEC) 20 MG capsule, TAKE 1 CAPSULE BY MOUTH DAILY, Disp: 100 capsule, Rfl: 2   ondansetron  (ZOFRAN ) 4 MG tablet, Take 1 tablet (4 mg total) by mouth every 8 (eight) hours as needed for nausea or vomiting., Disp: 40 tablet, Rfl: 3   oxyCODONE -acetaminophen  (PERCOCET)  5-325 MG tablet, Take 1 tablet by mouth every 6 (six) hours as needed. (Patient not taking: Reported on 02/15/2024), Disp: 10 tablet, Rfl: 0   rosuvastatin  (CRESTOR ) 5 MG tablet, TAKE 1 TABLET (5 MG TOTAL) BY MOUTH DAILY., Disp: 90 tablet, Rfl: 3   sildenafil  (REVATIO ) 20 MG tablet, TAKE 1 TO 4 TABLETS BY MOUTH DAILY AS NEEDED, Disp: 90 tablet, Rfl: 0   silodosin  (RAPAFLO ) 8 MG CAPS capsule, Take 1 capsule (8 mg total) by mouth daily with breakfast., Disp: 90 capsule, Rfl: 3   VITAMIN D PO, Take 1 capsule by mouth daily. 1000 units daily., Disp: , Rfl:   Allergies:  Allergies  Allergen Reactions   Penicillins Swelling    irriation    Past Medical History, Surgical history, Social history, and Family History were reviewed and updated.  Review of Systems: Review of Systems  Constitutional: Negative.   HENT:  Negative.    Eyes: Negative.   Respiratory: Negative.    Cardiovascular: Negative.   Gastrointestinal: Negative.   Endocrine: Negative.   Musculoskeletal:  Positive for back pain and flank pain.  Neurological: Negative.   Hematological: Negative.   Psychiatric/Behavioral: Negative.      Physical Exam: Temperature 98.1.  Pulse 65.  Blood pressure 122/64.  Weight is 221 pounds.    Wt Readings from Last 3 Encounters:  03/08/24 221 lb (100.2 kg)  02/15/24 213 lb 6 oz (96.8 kg)  12/31/23 212 lb 12.8 oz (96.5 kg)    Physical Exam Vitals reviewed.  HENT:     Head: Normocephalic and atraumatic.  Eyes:     Pupils: Pupils are equal, round, and reactive to light.  Cardiovascular:     Rate and Rhythm: Normal rate and regular rhythm.     Heart sounds: Normal heart sounds.  Pulmonary:     Effort: Pulmonary effort is normal.     Breath sounds: Normal breath sounds.  Abdominal:     General: Bowel sounds are normal.     Palpations: Abdomen is soft.  Musculoskeletal:        General: No tenderness or deformity. Normal range of motion.     Cervical back: Normal range of motion.   Lymphadenopathy:     Cervical: No cervical adenopathy.  Skin:    General: Skin is warm and dry.     Findings: No erythema or rash.  Neurological:     Mental Status: He is alert and oriented to person, place, and time.  Psychiatric:        Behavior: Behavior normal.        Thought Content: Thought content normal.        Judgment: Judgment normal.  Lab Results  Component Value Date   WBC 5.3 03/08/2024   HGB 12.9 (L) 03/08/2024   HCT 39.1 03/08/2024   MCV 103.4 (H) 03/08/2024   PLT 149 (L) 03/08/2024     Chemistry      Component Value Date/Time   NA 139 03/08/2024 1010   NA 139 08/05/2022 1606   K 4.9 03/08/2024 1010   CL 103 03/08/2024 1010   CO2 29 03/08/2024 1010   BUN 11 03/08/2024 1010   BUN 10 08/05/2022 1606   CREATININE 0.78 03/08/2024 1010   CREATININE 0.77 11/29/2014 1512      Component Value Date/Time   CALCIUM  9.8 03/08/2024 1010   ALKPHOS 45 03/08/2024 1010   AST 24 03/08/2024 1010   ALT 20 03/08/2024 1010   BILITOT 0.9 03/08/2024 1010      Impression and Plan: Mr. Bunn is a very nice 73 year old white male.  He has metastatic prostate cancer.  He has had radiotherapy.  This is helped a little bit.  I still think we had to be patient and let the antiandrogen therapy work.  He gets his degarelix  shot with Dr. Willye Harvey.  He will be interesting to see what his testosterone  level is.  I would like to have him come back in 1 month.  When he comes back, he will get his Zometa .  We did do a urinalysis on him.  I do not think he has a UTI.  We will have to await the culture.    Ivor Mars, MD 5/27/20252:47 PM

## 2024-03-08 NOTE — Progress Notes (Signed)
 Spoke with patient and his wife prior to MD visit. Patient qualifies for genetic testing. Reviewed this testing with them and provided them with genetic pamphlet. Him and his wife wished to proceed. Referral order placed.   Patient will continue on current treatment.   Oncology Nurse Navigator Documentation     03/08/2024   10:15 AM  Oncology Nurse Navigator Flowsheets  Navigator Follow Up Date: 04/07/2024  Navigator Follow Up Reason: Follow-up Appointment  Navigator Location [facility]  Navigator Encounter Type Follow-up Appt  Patient Visit Type MedOnc  Treatment Phase Active Tx  Barriers/Navigation Needs Coordination of Care  Education Other  Interventions Education;Referrals  Acuity Level 1-No Barriers  Referrals Genetics  Education Method Verbal;Written  Support Groups/Services Friends and Family  Time Spent with Patient 30

## 2024-03-08 NOTE — Telephone Encounter (Signed)
-----   Message from [place] sent at 03/07/2024  9:53 PM EDT ----- Patient would like to come in for U/A and possible culture for UTI symptoms on Tuesday 5/27.

## 2024-03-09 ENCOUNTER — Ambulatory Visit: Payer: Self-pay | Admitting: Hematology & Oncology

## 2024-03-09 LAB — PSA, TOTAL AND FREE
PSA, Free Pct: UNDETERMINED %
PSA, Free: <0.02 ng/mL
Prostate Specific Ag, Serum: <0.1 ng/mL (ref 0.0–4.0)

## 2024-03-09 LAB — URINE CULTURE: Culture: NO GROWTH

## 2024-03-09 LAB — TESTOSTERONE: Testosterone: 11 ng/dL — ABNORMAL LOW (ref 264–916)

## 2024-03-10 ENCOUNTER — Other Ambulatory Visit: Payer: Self-pay | Admitting: *Deleted

## 2024-03-10 DIAGNOSIS — C61 Malignant neoplasm of prostate: Secondary | ICD-10-CM

## 2024-03-10 MED ORDER — NUBEQA 300 MG PO TABS: 600.0000 mg | ORAL_TABLET | Freq: Two times a day (BID) | ORAL | 0 refills | Status: DC

## 2024-03-25 ENCOUNTER — Encounter: Payer: Self-pay | Admitting: Cardiology

## 2024-03-28 ENCOUNTER — Encounter: Payer: Self-pay | Admitting: Urology

## 2024-03-28 ENCOUNTER — Ambulatory Visit: Admitting: Urology

## 2024-03-28 VITALS — BP 155/74 | HR 87 | Ht 72.0 in | Wt 215.0 lb

## 2024-03-28 DIAGNOSIS — N138 Other obstructive and reflux uropathy: Secondary | ICD-10-CM

## 2024-03-28 DIAGNOSIS — N401 Enlarged prostate with lower urinary tract symptoms: Secondary | ICD-10-CM

## 2024-03-28 DIAGNOSIS — Z79818 Long term (current) use of other agents affecting estrogen receptors and estrogen levels: Secondary | ICD-10-CM

## 2024-03-28 DIAGNOSIS — C7951 Secondary malignant neoplasm of bone: Secondary | ICD-10-CM | POA: Diagnosis not present

## 2024-03-28 DIAGNOSIS — C61 Malignant neoplasm of prostate: Secondary | ICD-10-CM

## 2024-03-28 LAB — MICROSCOPIC EXAMINATION

## 2024-03-28 LAB — URINALYSIS, ROUTINE W REFLEX MICROSCOPIC
Bilirubin, UA: NEGATIVE
Nitrite, UA: NEGATIVE
RBC, UA: NEGATIVE
Specific Gravity, UA: 1.02 (ref 1.005–1.030)
Urobilinogen, Ur: 0.2 mg/dL (ref 0.2–1.0)
pH, UA: 6 (ref 5.0–7.5)

## 2024-03-28 LAB — BLADDER SCAN AMB NON-IMAGING: Scan Result: 170

## 2024-03-28 MED ORDER — LEUPROLIDE ACETATE (6 MONTH) 45 MG ~~LOC~~ KIT: 45.0000 mg | PACK | Freq: Once | SUBCUTANEOUS | Status: AC

## 2024-03-28 MED ORDER — LEUPROLIDE ACETATE (6 MONTH) 45 MG IM KIT: 45.0000 mg | PACK | Freq: Once | INTRAMUSCULAR | Status: AC

## 2024-03-28 NOTE — Progress Notes (Signed)
 Eligard  SubQ Injection   Due to Prostate Cancer patient is present today for a Eligard  Injection.  Medication: Eligard  6 month Dose: 45 mg  Location: right outter buttock Lot: [id number] Exp: 06/2025  Patient tolerated well, no complications were noted  Performed by: C. Candee Cha, LPN   Per Dr. Willye Harvey patient is to continue therapy for 6 months . This appointment was scheduled using wheel and given to patient today along with reminder continue on Vitamin D 800-1000iu and Calcium  1000-1200mg  daily while on Androgen Deprivation Therapy.  PA approval dates:

## 2024-03-28 NOTE — Progress Notes (Signed)
 Assessment: 1. Prostate cancer (HCC); PSA 5.3; GG 1,2; unfavorable intermediate risk   2. 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 Encounter for monitoring androgen deprivation therapy   4. BPH with obstruction/lower urinary tract symptoms     Plan: I personally reviewed the patient's chart including provider notes, lab and imaging results. Continue silodosin  Trial of Gemtesa 75 mg daily.  Samples given.  Use and side effects discussed. Continue androgen deprivation therapy with Eligard  6 month depot given today - next dose due after 09/26/24 Continue Nubeqa  Daily vit D and calcium  supplement Return to office in 6 months for next Eligard    Chief Complaint:  Chief Complaint  Patient presents with   Prostate Cancer    History of Present Illness:  William Phillips is a 73 y.o. male who is seen for continued evaluation of unfavorable intermediate risk prostate cancer with skeletal metastases, BPH with LUTS, and urinary retention.  He has a history of elevated PSA and BPH with LUTS. PSA results: 1/17 1.63 2/20 2.22 5/21 2.33 9/23 5.41 3/24 4.18 6/24 5.3  No prior prostate biopsy.  No history of UTIs or prostatitis.  No family history of prostate cancer. MRI of the prostate from 05/28/2023 showed a volume of 54.37 cm and PI-RADS 4 lesions in the right mid gland and apex and left apex.  He underwent fusion guided biopsy on 06/23/2023. Prostate volume measured approximately 86 g. Biopsy results: Gleason 3+4, Gleason 3+3 adenocarcinoma in 3/7 cores on left and 6/7 cores on right.  Both regions of interest were positive.  PSMA PET scan from 07/13/2023 showed avid uptake in the prostate with multifocal osseous metastases involving the posterior left iliac, right iliac, and L1 vertebral body. PSA 11/24: 0.48 PSA 12/24: 0.03 PSA 1/25: <0.02 PSA 2/25: <0.02  PSMA PET from 1/25 showed interval resolution of lesions in the prostate gland, no adenopathy or visceral  metastases; several skeletal metastases again demonstrated similar to prior study. He completed radiation therapy to the lumbar spine and bilateral iliac area in February 2025.  At his visit in March 2025, he continued on androgen deprivation therapy for his metastatic prostate cancer.  He reported decreased hot flashes.  He noted fatigue and lack of energy.  He continued on Nubeqa  and started on Zometa .  He continued on daily calcium  and vitamin D supplementation. He noted some increased urinary symptoms with frequency, urgency, and occasional urge incontinence.  He continued on silodosin .  No dysuria or gross hematuria. IPSS = 22/3.  PSA 5/25:  <0.1 Testosterone  5/25:  11 PSMA PET from 02/25/2024 showed skeletal metastatic disease in similar distribution including the pelvis, spine, ribs, and scapula.  He has a history of lower urinary tract symptoms for 4-5 years.  He had been on tamsulosin  0.4 mg for approximately 5 years.  He continued to have symptoms of frequency, voiding every 2 hours, occasional urgency and urge incontinence, and nocturia 2-5 times per night.  He reported voiding with a good stream.   No dysuria or gross hematuria. IPSS = 18. PVR = 181 ml.  He was given a trial of alfuzosin  10 mg daily at his visit in 3/24.  He reported difficulty voiding while on the medication and was changed to silodosin . He continued on silodosin  with improvement in his urinary symptoms with the medication.  He noted some improvement in his urinary stream as well as decreased nocturia and frequency.  No dysuria or gross hematuria. IPSS = 16.   He developed  urinary retention approximately 4-5 days after the biopsy.  He was seen in the emergency room and a Foley catheter was placed with return of approximately 2000 mL.   His foley was removed on 07/06/23.  He returned to the ER that evening unable to void and his foley was replaced.  He presented for a voiding trial on 07/13/23.  He was able to void  only a small amount.  He was also found to have paraphimosis which was reduced.   Due to the significant penile edema, his foley was replaced. His Foley catheter was removed and he was started on intermittent catheterization on 07/16/2023. He was continued on CIC and silodosin . He was started on ADT with firmagon  on 07/23/23. He has been seen by Dr. Maria Shiner with Oncology and started on Nubeqa .  At his visit in November 2024, he was tolerating the androgen deprivation therapy well.  No hot flashes.  He had started on Nubeqa  approximately 2 weeks prior and was tolerating this well.  He was voiding spontaneously.  He had not required intermittent catheterization for approximately 2 weeks.  He reported some frequency and intermittent stream.  No dysuria or gross hematuria. He continued on silodosin . IPSS = 17. Urine culture from 11/24:  >100K Enterococcus.  Treated with Levaquin  x 7 days. Urine culture from 3/25 grew >100 K staph.  Treated with Bactrim . Urine culture 5/25:  no growth  He returns today for follow-up.  He continues on ADT and is tolerating this fairly well.  He continues with lower urinary tract symptoms including frequency, urgency and nocturia.  His nocturia is currently 2-3 times per night.  He does have occasional urinary incontinence.  Portions of the above documentation were copied from a prior visit for review purposes only.   Past Medical History:  Past Medical History:  Diagnosis Date   Allergic rhinitis    Allergy 1953   Ankle fracture 1998   Asthma 1953   Atrial flutter (HCC)    a. s/p CTI ablation by Dr Nunzio Belch 11/2014   Back pain 08/05/2015   BPH (benign prostatic hyperplasia) 08/26/2015   Cancer (HCC) 0630/2024   Prostate Cancer   Ganglion cyst of dorsum of right wrist 04/08/2017   GERD (gastroesophageal reflux disease)    Hammer toe of right foot 05/06/2013   History of carpal tunnel syndrome    History of radiation therapy    Dr. Retta Caster  11/16/2023-11/27/2023   Hyperlipidemia    Hypertension    Insect bite 04/23/2015   OA (osteoarthritis) 02/06/2013   knees   Pedal edema 04/08/2017   Preventative health care 04/27/2011   Had normal colonoscopy in 2011     Past Surgical History:  Past Surgical History:  Procedure Laterality Date   A-FLUTTER ABLATION N/A 09/12/2021   Procedure: A-FLUTTER ABLATION;  Surgeon: Jolly Needle, MD;  Location: [facility];  Service: Cardiovascular;  Laterality: N/A;   ABLATION  11/24/2014   CTI ablation by Dr Nunzio Belch   ANKLE SURGERY     ATRIAL FIBRILLATION ABLATION N/A 09/02/2022   Procedure: ATRIAL FIBRILLATION ABLATION;  Surgeon: Efraim Grange, MD;  Location: [facility];  Service: Cardiovascular;  Laterality: N/A;   ATRIAL FLUTTER ABLATION N/A 11/24/2014   Procedure: ATRIAL FLUTTER ABLATION;  Surgeon: Jolly Needle, MD;  Location: [facility] CATH LAB;  Service: Cardiovascular;  Laterality: N/A;   CARDIOVERSION N/A 07/14/2022   Procedure: CARDIOVERSION;  Surgeon: Hazle Lites, MD;  Location: [facility] ENDOSCOPY;  Service: Cardiovascular;  Laterality: N/A;   Carpel tunnel     COLONOSCOPY  08/04/2002   Normal Exam- Dr Elvin Hammer   EYE SURGERY  1990   Radial Keratotomy   FRACTURE SURGERY  1998   Broken ankle   HERNIA REPAIR  2008   INGUINAL HERNIA REPAIR  10/13/2006   TONSILLECTOMY      Allergies:  Allergies  Allergen Reactions   Penicillins Swelling    irriation    Family History:  Family History  Problem Relation Age of Onset   Arthritis Mother    Stroke Mother    Coronary artery disease Father        aortic valve disease   Heart attack Father 70   COPD Father    Hypertension Father    Hyperlipidemia Father    Dementia Father    Cancer Sister 63       breast cancer   Diabetes Sister    Cancer Maternal Uncle        Prostate cancer   Diabetes type II Maternal Grandfather    Diabetes Maternal Grandfather    Hyperlipidemia Other    Hypertension Other     Social  History:  Social History   Tobacco Use   Smoking status: Former    Current packs/day: 0.00    Average packs/day: 1 pack/day for 10.0 years (10.0 ttl pk-yrs)    Types: Cigarettes    Start date: 34    Quit date: 1989    Years since quitting: 36.4   Smokeless tobacco: Never   Tobacco comments:    5-10 pack year history  Vaping Use   Vaping status: Never Used  Substance Use Topics   Alcohol use: Yes    Alcohol/week: 10.0 standard drinks of alcohol    Types: 10 Glasses of wine per week    Comment: 1-2 drinks daily (wine)   Drug use: No    ROS: Constitutional:  Negative for fever, chills, weight loss CV: Negative for chest pain, previous MI, hypertension Respiratory:  Negative for shortness of breath, wheezing, sleep apnea, frequent cough GI:  Negative for nausea, vomiting, bloody stool, GERD  Physical exam: BP (!) 155/74   Pulse 87   Ht 6' (1.829 m)   Wt 215 lb (97.5 kg)   BMI 29.16 kg/m  GENERAL APPEARANCE:  Well appearing, well developed, well nourished, NAD HEENT:  Atraumatic, normocephalic, oropharynx clear NECK:  Supple without lymphadenopathy or thyromegaly ABDOMEN:  Soft, non-tender, no masses EXTREMITIES:  Moves all extremities well, without clubbing, cyanosis, or edema NEUROLOGIC:  Alert and oriented x 3, normal gait, CN II-XII grossly intact MENTAL STATUS:  appropriate BACK:  Non-tender to palpation, No CVAT SKIN:  Warm, dry, and intact  Results: U/A: 11-30 WBC, 0-2 RBC, few bacteria  PVR = 170 ml

## 2024-03-31 ENCOUNTER — Other Ambulatory Visit: Payer: Self-pay | Admitting: *Deleted

## 2024-03-31 DIAGNOSIS — C61 Malignant neoplasm of prostate: Secondary | ICD-10-CM

## 2024-03-31 MED ORDER — NUBEQA 300 MG PO TABS: 600.0000 mg | ORAL_TABLET | Freq: Two times a day (BID) | ORAL | 0 refills | Status: DC

## 2024-04-06 ENCOUNTER — Ambulatory Visit (INDEPENDENT_AMBULATORY_CARE_PROVIDER_SITE_OTHER): Admitting: Family Medicine

## 2024-04-06 ENCOUNTER — Encounter: Payer: Self-pay | Admitting: Family Medicine

## 2024-04-06 VITALS — BP 120/73 | HR 74 | Temp 97.7°F | Ht 72.0 in | Wt 218.4 lb

## 2024-04-06 DIAGNOSIS — R600 Localized edema: Secondary | ICD-10-CM

## 2024-04-06 DIAGNOSIS — Z7984 Long term (current) use of oral hypoglycemic drugs: Secondary | ICD-10-CM | POA: Diagnosis not present

## 2024-04-06 DIAGNOSIS — I10 Essential (primary) hypertension: Secondary | ICD-10-CM | POA: Diagnosis not present

## 2024-04-06 DIAGNOSIS — E119 Type 2 diabetes mellitus without complications: Secondary | ICD-10-CM

## 2024-04-06 DIAGNOSIS — E782 Mixed hyperlipidemia: Secondary | ICD-10-CM | POA: Diagnosis not present

## 2024-04-06 LAB — HEMOGLOBIN A1C: Hgb A1c MFr Bld: 5.7 % (ref 4.6–6.5)

## 2024-04-06 LAB — BRAIN NATRIURETIC PEPTIDE: Pro B Natriuretic peptide (BNP): 132 pg/mL — ABNORMAL HIGH (ref 0.0–100.0)

## 2024-04-06 MED ORDER — ACCU-CHEK SOFTCLIX LANCETS MISC: 12 refills | Status: AC

## 2024-04-06 MED ORDER — GLUCOSE BLOOD VI STRP
ORAL_STRIP | 12 refills | Status: AC
Start: 2024-04-06 — End: ?

## 2024-04-06 NOTE — Progress Notes (Signed)
 Established Patient Office Visit  Subjective   Patient ID: William Phillips, male    DOB: 20-Jul-1951  Age: 73 y.o. MRN: [id number]  Chief Complaint  Patient presents with   Follow-up   Hip Pain       Discussed the use of AI scribe software for clinical note transcription with the patient, who gave verbal consent to proceed.  History of Present Illness William Phillips is a 73 year old male with metastatic osseous disease who presents for a regular follow-up visit.  He experiences persistent pain in his lower back and hip, ongoing for several months, now radiating into his femur over the past four weeks. The pain affects his ability to walk, stating 'it hurts to walk.' He previously underwent radiation treatment in the winter, consisting of ten sessions, which provided significant relief at the time. However, the pain has started to return. A recent PET scan from May 15th showed osseous metastatic disease in the right iliac area (less activity than before per patient).  He experiences ankle swelling, which worsens throughout the day and improves by morning. He follows a low salt diet and elevates his legs frequently. He does not wear compression socks. The swelling is not accompanied by shortness of breath or chest pain. He walks several times a week for about two miles without experiencing any chest pain or shortness of breath.  He is currently on Eliquis  5 mg twice a day for atrial fibrillation and reports no palpitations or rapid heart rates. He takes glimepiride  2 mg with breakfast for diabetes, metoprolol  50 mg once a day for heart rate control, Prilosec for reflux, Crestor  for cholesterol, sildenafil  and silodosin  for prostate issues, and various vitamins including calcium , vitamin D, and vitamin C. He uses allergy medication as needed, particularly in the fall and spring.  He has a history of prostate cancer and is under the care of Dr. Roseann. He is currently taking  Nubeqa  twice a day for prostate cancer management. He saw Dr. Roseann last Monday and has an upcoming appointment with Dr. Timmy for labs and an infusion tomorrow  He monitors his blood sugar levels regularly, with recent fasting readings around 104. He keeps a log of his readings, which are generally controlled and rarely above 110. He has not experienced any low readings. He wants to lose weight and has previously discussed GLP-1 medications with Dr. Domenica. Despite efforts with diet and exercise, he has not seen significant weight loss.      Lab Results  Component Value Date   HGBA1C 5.7 04/06/2024        ROS All review of systems negative except what is listed in the HPI    Objective:     BP 120/73   Pulse 74   Temp 97.7 F (36.5 C)   Ht 6' (1.829 m)   Wt 218 lb 6.4 oz (99.1 kg)   SpO2 97%   BMI 29.62 kg/m    Physical Exam Vitals reviewed.  Constitutional:      Appearance: Normal appearance.   Cardiovascular:     Rate and Rhythm: Normal rate and regular rhythm.     Heart sounds: Normal heart sounds. No murmur heard. Pulmonary:     Effort: Pulmonary effort is normal.     Breath sounds: Normal breath sounds.   Musculoskeletal:     Right lower leg: 2+ Edema present.     Left lower leg: 2+ Edema present.   Skin:    General:  Skin is warm and dry.     Findings: No erythema or rash.   Neurological:     Mental Status: He is alert and oriented to person, place, and time.   Psychiatric:        Mood and Affect: Mood normal.        Behavior: Behavior normal.        Thought Content: Thought content normal.        Judgment: Judgment normal.         Results for orders placed or performed in visit on 04/06/24  Hemoglobin A1c  Result Value Ref Range   Hgb A1c MFr Bld 5.7 4.6 - 6.5 %  Brain natriuretic peptide  Result Value Ref Range   Pro B Natriuretic peptide (BNP) 132.0 (H) 0.0 - 100.0 pg/mL      The 10-year ASCVD risk score (Arnett DK, et al.,  2019) is: 28.3%    Assessment & Plan:   Problem List Items Addressed This Visit       Active Problems   Hyperlipidemia, mixed   Essential hypertension   Controlled type 2 diabetes mellitus without complication, without long-term current use of insulin (HCC)   Relevant Medications   glucose blood test strip   Accu-Chek Softclix Lancets lancets   Other Relevant Orders   Hemoglobin A1c (Completed)   Other Visit Diagnoses       Bilateral leg edema    -  Primary   Relevant Orders   Brain natriuretic peptide (Completed)       Assessment & Plan Metastatic Bone Disease Persistent pain in lower back and hip due to osseous metastasis in right iliac. PET scan shows decreased activity, but pain persists. Previous radiation provided relief. Scheduled for oncologist evaluation, labs and infusion tomorrow.  - Discuss pain and potential need for further imaging or therapy with oncologist Dr. Timmy. - Consider x-ray or physical therapy if oncologist deems necessary. - Evaluate for pain management options with oncologist.  Prostate Cancer Under Dr. Annis care, on Nubeqa  twice daily, compliant with regimen.  Atrial Fibrillation On Eliquis  5 mg twice daily, no palpitations or chest pain, condition well-controlled.  Dependent Edema Ankle swelling consistent with dependent edema, not using compression socks. Last echocardiogram stable, no new symptoms. - Recommend wearing compression socks up to the knees during the day. - Advise elevating legs to heart level when sitting. - Monitor for any new symptoms such as chest pain or shortness of breath. - Review labs from oncologist to check kidney function and electrolytes. BNP today. - Consider short course of diuretics if edema worsens or new symptoms develop. ED for severe symptoms.  - Weigh daily at home and let us /cardiology know of any changes.  Type 2 Diabetes Mellitus On glimepiride  2 mg, blood glucose 100-110 mg/dL. Interested  in GLP-1 agonists for weight loss. No history of medullary thyroid  cancer, multiple endocrine neoplasia or pancreatitis. Discussed GLP-1 side effects and dietary modifications. - Check A1c level today. - Consider prescribing GLP-1 agonist based on A1c results and insurance coverage. - Educate on potential side effects of GLP-1 agonists, including nausea and constipation. - Advise on dietary modifications to prevent nausea and constipation. - Renew prescription for glucose test strips and lancets.        Return in about 3 months (around 07/07/2024) for chronic disease management, PCP.    Waddell KATHEE Mon, NP

## 2024-04-07 ENCOUNTER — Ambulatory Visit (HOSPITAL_BASED_OUTPATIENT_CLINIC_OR_DEPARTMENT_OTHER)
Admission: RE | Admit: 2024-04-07 | Discharge: 2024-04-07 | Disposition: A | Discharge status: Home / Self Care | Source: Ambulatory Visit | Attending: Hematology & Oncology | Admitting: Hematology & Oncology

## 2024-04-07 ENCOUNTER — Encounter: Payer: Self-pay | Admitting: Hematology & Oncology

## 2024-04-07 ENCOUNTER — Encounter: Payer: Self-pay | Admitting: *Deleted

## 2024-04-07 ENCOUNTER — Ambulatory Visit: Payer: Self-pay | Admitting: Family Medicine

## 2024-04-07 ENCOUNTER — Inpatient Hospital Stay: Admitting: Hematology & Oncology

## 2024-04-07 ENCOUNTER — Ambulatory Visit: Payer: Self-pay | Admitting: Hematology & Oncology

## 2024-04-07 ENCOUNTER — Inpatient Hospital Stay

## 2024-04-07 ENCOUNTER — Other Ambulatory Visit: Payer: Self-pay

## 2024-04-07 ENCOUNTER — Inpatient Hospital Stay: Attending: Hematology & Oncology

## 2024-04-07 VITALS — BP 122/54 | HR 68 | Temp 99.0°F | Resp 17 | Ht 72.0 in | Wt 218.0 lb

## 2024-04-07 VITALS — BP 124/68 | HR 59

## 2024-04-07 DIAGNOSIS — R600 Localized edema: Secondary | ICD-10-CM

## 2024-04-07 DIAGNOSIS — C7951 Secondary malignant neoplasm of bone: Secondary | ICD-10-CM

## 2024-04-07 DIAGNOSIS — C61 Malignant neoplasm of prostate: Secondary | ICD-10-CM | POA: Insufficient documentation

## 2024-04-07 DIAGNOSIS — M1611 Unilateral primary osteoarthritis, right hip: Secondary | ICD-10-CM | POA: Diagnosis not present

## 2024-04-07 DIAGNOSIS — M25551 Pain in right hip: Secondary | ICD-10-CM | POA: Diagnosis not present

## 2024-04-07 LAB — CMP (CANCER CENTER ONLY)
ALT: 14 U/L (ref 0–44)
AST: 17 U/L (ref 15–41)
Albumin: 4.1 g/dL (ref 3.5–5.0)
Alkaline Phosphatase: 42 U/L (ref 38–126)
Anion gap: 10 (ref 5–15)
BUN: 14 mg/dL (ref 8–23)
CO2: 26 mmol/L (ref 22–32)
Calcium: 9.2 mg/dL (ref 8.9–10.3)
Chloride: 104 mmol/L (ref 98–111)
Creatinine: 0.73 mg/dL (ref 0.61–1.24)
GFR, Estimated: >60 mL/min (ref >60)
Glucose, Bld: 125 mg/dL — ABNORMAL HIGH (ref 70–99)
Potassium: 3.7 mmol/L (ref 3.5–5.1)
Sodium: 140 mmol/L (ref 135–145)
Total Bilirubin: 0.7 mg/dL (ref 0.0–1.2)
Total Protein: 6.6 g/dL (ref 6.5–8.1)

## 2024-04-07 LAB — CBC WITH DIFFERENTIAL (CANCER CENTER ONLY)
Abs Immature Granulocytes: 0.03 10*3/uL (ref 0.00–0.07)
Basophils Absolute: 0 10*3/uL (ref 0.0–0.1)
Basophils Relative: 0 %
Eosinophils Absolute: 0.3 10*3/uL (ref 0.0–0.5)
Eosinophils Relative: 5 %
HCT: 37.5 % — ABNORMAL LOW (ref 39.0–52.0)
Hemoglobin: 12.7 g/dL — ABNORMAL LOW (ref 13.0–17.0)
Immature Granulocytes: 1 %
Lymphocytes Relative: 17 %
Lymphs Abs: 1 10*3/uL (ref 0.7–4.0)
MCH: 34 pg (ref 26.0–34.0)
MCHC: 33.9 g/dL (ref 30.0–36.0)
MCV: 100.5 fL — ABNORMAL HIGH (ref 80.0–100.0)
Monocytes Absolute: 0.7 10*3/uL (ref 0.1–1.0)
Monocytes Relative: 12 %
Neutro Abs: 3.7 10*3/uL (ref 1.7–7.7)
Neutrophils Relative %: 65 %
Platelet Count: 165 10*3/uL (ref 150–400)
RBC: 3.73 MIL/uL — ABNORMAL LOW (ref 4.22–5.81)
RDW: 12.4 % (ref 11.5–15.5)
WBC Count: 5.7 10*3/uL (ref 4.0–10.5)
nRBC: 0 % (ref 0.0–0.2)

## 2024-04-07 MED ORDER — POTASSIUM CHLORIDE CRYS ER 20 MEQ PO TBCR: 20.0000 meq | EXTENDED_RELEASE_TABLET | Freq: Every day | ORAL | 0 refills | Status: DC

## 2024-04-07 MED ORDER — SULFAMETHOXAZOLE-TRIMETHOPRIM 800-160 MG PO TABS
1.0000 | ORAL_TABLET | Freq: Two times a day (BID) | ORAL | 4 refills | Status: DC
Start: 2024-04-07 — End: 2024-07-11

## 2024-04-07 MED ORDER — SODIUM CHLORIDE 0.9 % IV SOLN: INTRAVENOUS | Status: DC

## 2024-04-07 MED ORDER — FUROSEMIDE 20 MG PO TABS
20.0000 mg | ORAL_TABLET | Freq: Every day | ORAL | 0 refills | Status: DC
Start: 2024-04-07 — End: 2024-07-07

## 2024-04-07 MED ORDER — IOHEXOL 300 MG/ML  SOLN
100.0000 mL | Freq: Once | INTRAMUSCULAR | Status: AC | PRN
  Administered 2024-04-07: 100 mL via INTRAVENOUS

## 2024-04-07 MED ORDER — ZOLEDRONIC ACID 4 MG/100ML IV SOLN
4.0000 mg | Freq: Once | INTRAVENOUS | Status: AC
  Administered 2024-04-07: 4 mg via INTRAVENOUS
  Filled 2024-04-07: qty 100

## 2024-04-07 NOTE — Progress Notes (Signed)
 Hematology and Oncology Follow Up Visit  William Phillips [id number] 04-21-1951 73 y.o. 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 twice daily-started on 07/30/2023 Zometa  4 mg IV every 3 months -next on 06/2024  Status post radiotherapy to right/left iliac and L1    Interim History:  William Phillips is back for follow-up.  He is having a lot more pain in the right hip.  I think he has had radiation to that area.  We will go ahead and get a CT scan to see what might be going on.  And just might be osteoarthritis.  It could also be bursitis.  It could be avascular necrosis.  His last PSA was still not detectable.  The testosterone  level was down to 11.  He and his wife had a nice time at [facility].  They enjoyed themselves.  He has had no fever.  He has had no change in bowel or bladder habits.  He still has a little bit of urinary frequency.  He does have some nocturia.  He has had no problems with fever.  He has had no bleeding.  He has had a little bit of leg swelling down to the ankles.  There is still some fatigue.  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  Currently, I would have to say that his performance status is probably ECOG 1.     Medications:  Current Outpatient Medications:    amLODipine  (NORVASC ) 10 MG tablet, Take 10 mg by mouth daily., Disp: , Rfl:    triamcinolone  cream (KENALOG ) 0.1 %, Apply 1 Application topically 3 (three) times daily., Disp: , Rfl:    Accu-Chek Softclix Lancets lancets, Use as instructed, Disp: 100 each, Rfl: 12   apixaban  (ELIQUIS ) 5 MG TABS tablet, TAKE 1 TABLET BY MOUTH TWICE  DAILY, Disp: 200 tablet, Rfl: 1   Ascorbic Acid (VITAMIN C PO), Take 1 tablet by mouth daily., Disp: , Rfl:    Blood Glucose Monitoring Suppl DEVI, 1 each by Does not apply route in  the morning, at noon, and at bedtime. May substitute to any manufacturer covered by patient's insurance., Disp: 1 each, Rfl: 0   Brimonidine Tartrate (LUMIFY) 0.025 % SOLN, Place 1 drop into both eyes every morning., Disp: , Rfl:    Calcium  Carbonate-Vit D-Min (CALCIUM  1200 PO), Take 1,200 mg by mouth daily., Disp: , Rfl:    Carboxymethylcellulose Sodium (LUBRICANT EYE DROPS OP), Place 1 drop into both eyes 2 (two) times daily as needed (Dry eye)., Disp: , Rfl:    Clobetasol Propionate 0.05 % shampoo, Apply 1 application topically daily as needed (irritation)., Disp: , Rfl:    clotrimazole  (CLOTRIMAZOLE  ANTI-FUNGAL) 1 % cream, Apply 1 Application topically 2 (two) times daily., Disp: 30 g, Rfl: 0   darolutamide  (NUBEQA ) 300 MG tablet, Take 2 tablets (600 mg total) by mouth 2 (two) times daily with a meal., Disp: 120 tablet, Rfl: 0   fexofenadine  (ALLEGRA ) 180 MG tablet, Take 1 tablet (180 mg total) by mouth daily., Disp: , Rfl:    fluticasone  (FLONASE ) 50 MCG/ACT nasal spray, Place 2 sprays into both nostrils daily as needed for allergies or rhinitis., Disp: , Rfl: 2   glimepiride  (AMARYL ) 2 MG tablet, Take 1 tablet (2 mg total) by mouth daily with breakfast., Disp: 30 tablet, Rfl: 5   glucose  blood test strip, Use as instructed, Disp: 100 each, Rfl: 12   ketoconazole (NIZORAL) 2 % shampoo, Apply 1 application topically daily as needed for irritation., Disp: , Rfl:    lisinopril  (ZESTRIL ) 10 MG tablet, Take 1 tablet (10 mg total) by mouth daily., Disp: 90 tablet, Rfl: 3   metoprolol  succinate (TOPROL -XL) 50 MG 24 hr tablet, TAKE 1 TABLET BY MOUTH TWICE  DAILY WITH OR IMMEDIATELY  FOLLOWING A MEAL, Disp: 200 tablet, Rfl: 2   Multiple Vitamin (MULTIVITAMIN) tablet, Take 1 tablet by mouth every evening., Disp: , Rfl:    omeprazole  (PRILOSEC) 20 MG capsule, TAKE 1 CAPSULE BY MOUTH DAILY, Disp: 100 capsule, Rfl: 2   ondansetron  (ZOFRAN ) 4 MG tablet, Take 1 tablet (4 mg total) by mouth every 8 (eight)  hours as needed for nausea or vomiting., Disp: 40 tablet, Rfl: 3   oxyCODONE -acetaminophen  (PERCOCET) 5-325 MG tablet, Take 1 tablet by mouth every 6 (six) hours as needed., Disp: 10 tablet, Rfl: 0   rosuvastatin  (CRESTOR ) 5 MG tablet, TAKE 1 TABLET (5 MG TOTAL) BY MOUTH DAILY., Disp: 90 tablet, Rfl: 3   sildenafil  (REVATIO ) 20 MG tablet, TAKE 1 TO 4 TABLETS BY MOUTH DAILY AS NEEDED, Disp: 90 tablet, Rfl: 0   silodosin  (RAPAFLO ) 8 MG CAPS capsule, Take 1 capsule (8 mg total) by mouth daily with breakfast., Disp: 90 capsule, Rfl: 3   VITAMIN D PO, Take 1 capsule by mouth daily. 1000 units daily., Disp: , Rfl:   Current Facility-Administered Medications:    leuprolide  (6 Month) (LUPRON ) injection 45 mg, 45 mg, Intramuscular, Once, [place], Adine PARAS., MD  Allergies:  Allergies  Allergen Reactions   Penicillins Swelling    irriation    Past Medical History, Surgical history, Social history, and Family History were reviewed and updated.  Review of Systems: Review of Systems  Constitutional: Negative.   HENT:  Negative.    Eyes: Negative.   Respiratory: Negative.    Cardiovascular: Negative.   Gastrointestinal: Negative.   Endocrine: Negative.   Musculoskeletal:  Positive for back pain and flank pain.  Neurological: Negative.   Hematological: Negative.   Psychiatric/Behavioral: Negative.      Physical Exam: Vital signs are temperature of 99.  Pulse 68.  Blood pressure 122/54.  Weight is 218 pounds.   Wt Readings from Last 3 Encounters:  04/07/24 218 lb (98.9 kg)  04/06/24 218 lb 6.4 oz (99.1 kg)  03/28/24 215 lb (97.5 kg)    Physical Exam Vitals reviewed.  HENT:     Head: Normocephalic and atraumatic.   Eyes:     Pupils: Pupils are equal, round, and reactive to light.    Cardiovascular:     Rate and Rhythm: Normal rate and regular rhythm.     Heart sounds: Normal heart sounds.  Pulmonary:     Effort: Pulmonary effort is normal.     Breath sounds: Normal breath  sounds.  Abdominal:     General: Bowel sounds are normal.     Palpations: Abdomen is soft.   Musculoskeletal:        General: No tenderness or deformity. Normal range of motion.     Cervical back: Normal range of motion.  Lymphadenopathy:     Cervical: No cervical adenopathy.   Skin:    General: Skin is warm and dry.     Findings: No erythema or rash.   Neurological:     Mental Status: He is alert and oriented to person, place, and time.  Psychiatric:        Behavior: Behavior normal.        Thought Content: Thought content normal.        Judgment: Judgment normal.      Lab Results  Component Value Date   WBC 5.7 04/07/2024   HGB 12.7 (L) 04/07/2024   HCT 37.5 (L) 04/07/2024   MCV 100.5 (H) 04/07/2024   PLT 165 04/07/2024     Chemistry      Component Value Date/Time   NA 140 04/07/2024 1018   NA 139 08/05/2022 1606   K 3.7 04/07/2024 1018   CL 104 04/07/2024 1018   CO2 26 04/07/2024 1018   BUN 14 04/07/2024 1018   BUN 10 08/05/2022 1606   CREATININE 0.73 04/07/2024 1018   CREATININE 0.77 11/29/2014 1512      Component Value Date/Time   CALCIUM  9.2 04/07/2024 1018   ALKPHOS 42 04/07/2024 1018   AST 17 04/07/2024 1018   ALT 14 04/07/2024 1018   BILITOT 0.7 04/07/2024 1018      Impression and Plan: Mr. Toral is a very nice 73 year old white male.  He has metastatic prostate cancer.  He has had radiotherapy.  This has helped a little bit.  We will have to see what a CT scan shows.  Again, there may be something going on with that hip.  Hopefully, it is nothing that we would have to consider surgery for.  He does get his Zometa  today.  He recently got the Eligard  at Dr. Annis office.  I will plan to have him come back to see me in another 6 weeks or so.  Again, if the CT scan shows a problem, then we will get him back sooner.  Maude JONELLE Crease, MD 6/26/202510:56 AM

## 2024-04-07 NOTE — Patient Instructions (Signed)

## 2024-04-07 NOTE — Progress Notes (Signed)
 Patient has increasing pain to R hip. CT will be done this afternoon to assess for cause. He will continue on current treatment regimen.   Oncology Nurse Navigator Documentation     04/07/2024   10:15 AM  Oncology Nurse Navigator Flowsheets  Navigator Follow Up Date: 04/08/2024  Navigator Follow Up Reason: Scan Review  Navigator Location [facility]  Navigator Encounter Type Follow-up Appt  Patient Visit Type MedOnc  Treatment Phase Active Tx  Barriers/Navigation Needs Coordination of Care  Interventions None Required  Acuity Level 1-No Barriers  Support Groups/Services Friends and Family  Time Spent with Patient 15

## 2024-04-08 ENCOUNTER — Encounter: Payer: Self-pay | Admitting: *Deleted

## 2024-04-08 LAB — TESTOSTERONE: Testosterone: 14 ng/dL — ABNORMAL LOW (ref 264–916)

## 2024-04-08 LAB — PSA, TOTAL AND FREE
PSA, Free Pct: UNDETERMINED %
PSA, Free: <0.02 ng/mL
Prostate Specific Ag, Serum: <0.1 ng/mL (ref 0.0–4.0)

## 2024-04-08 NOTE — Progress Notes (Signed)
 Reviewed CT which is negative for malignancy.   Oncology Nurse Navigator Documentation     04/08/2024    7:45 AM  Oncology Nurse Navigator Flowsheets  Navigator Follow Up Date: 05/05/2024  Navigator Follow Up Reason: Follow-up Appointment  Navigator Location [facility]  Navigator Encounter Type Scan Review  Patient Visit Type MedOnc  Treatment Phase Active Tx  Barriers/Navigation Needs Coordination of Care  Interventions None Required  Acuity Level 1-No Barriers  Support Groups/Services Friends and Family  Time Spent with Patient 15

## 2024-04-12 ENCOUNTER — Encounter: Payer: Self-pay | Admitting: Family Medicine

## 2024-04-12 ENCOUNTER — Ambulatory Visit (INDEPENDENT_AMBULATORY_CARE_PROVIDER_SITE_OTHER): Admitting: Family Medicine

## 2024-04-12 VITALS — BP 130/62 | HR 76 | Ht 72.0 in | Wt 220.0 lb

## 2024-04-12 DIAGNOSIS — R600 Localized edema: Secondary | ICD-10-CM | POA: Diagnosis not present

## 2024-04-12 NOTE — Progress Notes (Signed)
 Acute Office Visit  Subjective:     Patient ID: William Phillips, male    DOB: [birth year], 73 y.o.   MRN: [id number]  Chief Complaint  Patient presents with   Medical Management of Chronic Issues    HPI Patient is in today for edema follow-up.  Discussed the use of AI scribe software for clinical note transcription with the patient, who gave verbal consent to proceed.  History of Present Illness Kienan Doublin is a 73 year old male with hypertension who presents for management of leg swelling and medication review.  At last visit he had 2-3+ pitting edema bilaterally with mildly elevated BNP and stable renal function. Supportive measures were encouraged, with 2 days of lasix /potassium sent and close follow-up. States he did not end up taking the lasix .  He notes a decrease in leg swelling, which he attributes to wearing compression socks. Although he did not wear the socks yesterday or today, he has been wearing them regularly since his last visit, noting significant improvement in swelling.  He has been elevating his legs while indoors, which he believes has contributed to the reduction in swelling. He acknowledges that being out in the heat exacerbates the swelling.  His blood pressure has been stable, with a reading of 131/75 at home. He uses an Omron device to monitor his blood pressure at home.  No new or worsening symptoms related to his current condition.      ROS All review of systems negative except what is listed in the HPI      Objective:    BP 130/62   Pulse 76   Ht 6' (1.829 m)   Wt 220 lb (99.8 kg)   SpO2 98%   BMI 29.84 kg/m    Physical Exam Vitals reviewed.  Constitutional:      Appearance: Normal appearance.   Cardiovascular:     Rate and Rhythm: Normal rate and regular rhythm.     Heart sounds: Normal heart sounds. No murmur heard. Pulmonary:     Effort: Pulmonary effort is normal.     Breath sounds: Normal breath sounds.    Musculoskeletal:     Right lower leg: 1+ Edema present.     Left lower leg: 1+ Edema present.   Skin:    General: Skin is warm and dry.     Findings: No erythema or rash.   Neurological:     Mental Status: He is alert and oriented to person, place, and time.   Psychiatric:        Mood and Affect: Mood normal.        Behavior: Behavior normal.        Thought Content: Thought content normal.        Judgment: Judgment normal.     No results found for any visits on 04/12/24.      Assessment & Plan:   Problem List Items Addressed This Visit   None Visit Diagnoses       Bilateral leg edema    -  Primary       Assessment & Plan Dependent Edema Improvement with compression socks and leg elevation. Advised Lasix  for significant weight gain or increased swelling, with monitoring of potassium and kidney function. Emphasized hydration and sodium restriction. - Continue compression socks and leg elevation. - Avoid heat exposure and maintain hydration. - Monitor for significant weight gain or increased swelling; if occurs, take Lasix  (2 days) and inform provider for potassium and kidney function  monitoring.      No orders of the defined types were placed in this encounter.   Return if symptoms worsen or fail to improve.  Waddell KATHEE Mon, NP

## 2024-04-20 DIAGNOSIS — G8929 Other chronic pain: Secondary | ICD-10-CM | POA: Diagnosis not present

## 2024-04-20 DIAGNOSIS — M533 Sacrococcygeal disorders, not elsewhere classified: Secondary | ICD-10-CM | POA: Diagnosis not present

## 2024-04-27 ENCOUNTER — Other Ambulatory Visit: Payer: Self-pay

## 2024-04-27 DIAGNOSIS — M533 Sacrococcygeal disorders, not elsewhere classified: Secondary | ICD-10-CM | POA: Diagnosis not present

## 2024-04-27 DIAGNOSIS — C61 Malignant neoplasm of prostate: Secondary | ICD-10-CM

## 2024-04-27 MED ORDER — NUBEQA 300 MG PO TABS
600.0000 mg | ORAL_TABLET | Freq: Two times a day (BID) | ORAL | 3 refills | Status: DC
Start: 2024-04-27 — End: 2024-09-07

## 2024-05-02 ENCOUNTER — Encounter: Payer: Self-pay | Admitting: Hematology & Oncology

## 2024-05-02 ENCOUNTER — Encounter: Payer: Self-pay | Admitting: Genetic Counselor

## 2024-05-03 ENCOUNTER — Other Ambulatory Visit: Payer: Self-pay | Admitting: *Deleted

## 2024-05-03 ENCOUNTER — Inpatient Hospital Stay: Attending: Hematology & Oncology | Admitting: Genetic Counselor

## 2024-05-03 ENCOUNTER — Other Ambulatory Visit

## 2024-05-03 ENCOUNTER — Encounter: Payer: Self-pay | Admitting: Genetic Counselor

## 2024-05-03 DIAGNOSIS — Z8042 Family history of malignant neoplasm of prostate: Secondary | ICD-10-CM

## 2024-05-03 DIAGNOSIS — C61 Malignant neoplasm of prostate: Secondary | ICD-10-CM | POA: Diagnosis not present

## 2024-05-03 DIAGNOSIS — Z79899 Other long term (current) drug therapy: Secondary | ICD-10-CM | POA: Insufficient documentation

## 2024-05-03 DIAGNOSIS — Z803 Family history of malignant neoplasm of breast: Secondary | ICD-10-CM | POA: Insufficient documentation

## 2024-05-03 DIAGNOSIS — C7951 Secondary malignant neoplasm of bone: Secondary | ICD-10-CM | POA: Insufficient documentation

## 2024-05-03 NOTE — Progress Notes (Signed)
 REFERRING PROVIDER: Timmy Maude SAUNDERS, MD [address] [place],  [place] [address]  PRIMARY PROVIDER:  Domenica Harlene LABOR, MD  PRIMARY REASON FOR VISIT:  1. Family history of breast cancer   2. Family history of prostate cancer   3. Prostate cancer (HCC); PSA 5.3; GG 1,2; unfavorable intermediate risk      HISTORY OF PRESENT ILLNESS:  I connected with  William Phillips on 05/03/2024 at 10 AM EDT by telephone and verified that I am speaking with the correct person using two identifiers.   Patient location: Home Provider location: Office   William Phillips, a 73 y.o. male, was seen for a [facility] cancer genetics consultation at the request of Dr. Timmy due to a personal and family history of cancer.  William Phillips presents to clinic today to discuss the possibility of a hereditary predisposition to cancer, genetic testing, and to further clarify his future cancer risks, as well as potential cancer risks for family members.   In 2024, at the age of 40, William Phillips was diagnosed with cancer of the prostate.     CANCER HISTORY:  Oncology History  Prostate cancer [facility]); PSA 5.3; GG 1,2; unfavorable intermediate risk  07/02/2023 Initial Diagnosis   Prostate cancer (HCC); PSA 5.3; GG 1,2; unfavorable intermediate risk   07/29/2023 Cancer Staging   Staging form: Prostate, AJCC 8th Edition - Clinical: Stage IVB (cT2b, cN0, pM1, PSA: 5.3, Grade Group: 3) - Signed by Timmy Maude SAUNDERS, MD on 07/29/2023 Stage prefix: Initial diagnosis Prostate specific antigen (PSA) range: Less than 10 Gleason score: 7 Histologic grading system: 5 grade system      Past Medical History:  Diagnosis Date   Allergic rhinitis    Allergy 1953   Ankle fracture 1998   Asthma 1953   Atrial flutter (HCC)    a. s/p CTI ablation by Dr Kelsie 11/2014   Back pain 08/05/2015   BPH (benign prostatic hyperplasia) 08/26/2015   Cancer (HCC) 0630/2024   Prostate Cancer   Family history of breast cancer     Family history of prostate cancer    Ganglion cyst of dorsum of right wrist 04/08/2017   GERD (gastroesophageal reflux disease)    Hammer toe of right foot 05/06/2013   History of carpal tunnel syndrome    History of radiation therapy    Dr. Lynwood Nasuti 11/16/2023-11/27/2023   Hyperlipidemia    Hypertension    Insect bite 04/23/2015   OA (osteoarthritis) 02/06/2013   knees   Pedal edema 04/08/2017   Preventative health care 04/27/2011   Had normal colonoscopy in 2011     Past Surgical History:  Procedure Laterality Date   A-FLUTTER ABLATION N/A 09/12/2021   Procedure: A-FLUTTER ABLATION;  Surgeon: Kelsie Lynwood, MD;  Location: [facility] INVASIVE CV LAB;  Service: Cardiovascular;  Laterality: N/A;   ABLATION  11/24/2014   CTI ablation by Dr Kelsie   ANKLE SURGERY     ATRIAL FIBRILLATION ABLATION N/A 09/02/2022   Procedure: ATRIAL FIBRILLATION ABLATION;  Surgeon: Nancey Eulas BRAVO, MD;  Location: [facility] INVASIVE CV LAB;  Service: Cardiovascular;  Laterality: N/A;   ATRIAL FLUTTER ABLATION N/A 11/24/2014   Procedure: ATRIAL FLUTTER ABLATION;  Surgeon: Lynwood Kelsie, MD;  Location[facility] CATH LAB;  Service: Cardiovascular;  Laterality: N/A;   CARDIOVERSION N/A 07/14/2022   Procedure: CARDIOVERSION;  Surgeon: Mona Vinie BROCKS, MD;  Location: [facility] ENDOSCOPY;  Service: Cardiovascular;  Laterality: N/A;   Carpel tunnel     COLONOSCOPY  08/04/2002   Normal Exam- Dr Abran   EYE SURGERY  1990   Radial Keratotomy   FRACTURE SURGERY  1998   Broken ankle   HERNIA REPAIR  2008   INGUINAL HERNIA REPAIR  10/13/2006   TONSILLECTOMY      Social History   Socioeconomic History   Marital status: Married    Spouse name: Not on file   Number of children: 1   Years of education: Not on file   Highest education level: Master's degree (e.g., MA, MS, MEng, MEd, MSW, MBA)  Occupational History    Comment: Sales  Tobacco Use   Smoking status: Former    Current packs/day: 0.00    Average packs/day: 1  pack/day for 10.0 years (10.0 ttl pk-yrs)    Types: Cigarettes    Start date: 30    Quit date: 1989    Years since quitting: 36.5   Smokeless tobacco: Never   Tobacco comments:    5-10 pack year history  Vaping Use   Vaping status: Never Used  Substance and Sexual Activity   Alcohol use: Yes    Alcohol/week: 10.0 standard drinks of alcohol    Types: 10 Glasses of wine per week    Comment: 1-2 drinks daily (wine)   Drug use: No   Sexual activity: Yes  Other Topics Concern   Not on file  Social History Narrative   Occupation: Airline pilot  Architect- chemicals, seed, erosion control)   Married- 35 years   21 daughter -Holiday representative in   ([place]   Previous smoker - 5-10 pack yrs     Alcohol use-yes (2-3 glasses of wine)     Social Drivers of Corporate investment banker Strain: Low Risk  (04/05/2024)   Overall Financial Resource Strain (CARDIA)    Difficulty of Paying Living Expenses: Not hard at all  Food Insecurity: No Food Insecurity (04/05/2024)   Hunger Vital Sign    Worried About Running Out of Food in the Last Year: Never true    Ran Out of Food in the Last Year: Never true  Transportation Needs: No Transportation Needs (04/05/2024)   PRAPARE - Administrator, Civil Service (Medical): No    Lack of Transportation (Non-Medical): No  Physical Activity: Sufficiently Active (04/05/2024)   Exercise Vital Sign    Days of Exercise per Week: 4 days    Minutes of Exercise per Session: 40 min  Stress: No Stress Concern Present (04/05/2024)   [employer] of Occupational Health - Occupational Stress Questionnaire    Feeling of Stress: Not at all  Social Connections: Socially Integrated (04/05/2024)   Social Connection and Isolation Panel    Frequency of Communication with Friends and Family: More than three times a week    Frequency of Social Gatherings with Friends and Family: Once a week    Attends Religious Services: More than 4 times per year    Active  Member of [employer] or Organizations: Yes    Attends Engineer, structural: More than 4 times per year    Marital Status: Married     FAMILY HISTORY:  We obtained a detailed, 4-generation family history.  Significant diagnoses are listed below: Family History  Problem Relation Age of Onset   Arthritis Mother    Stroke Mother    Coronary artery disease Father        aortic valve disease   Heart attack Father 71   COPD Father    Hypertension  Father    Hyperlipidemia Father    Dementia Father    Breast cancer Sister 51   Diabetes Sister    Prostate cancer Maternal Uncle    Diabetes type II Maternal Grandfather    Diabetes Maternal Grandfather    Hyperlipidemia Other    Hypertension Other       The patient has an adopted daughter who is cancer free.  He has a sister and brother.  The sister was diagnosed with breast cancer at 1.  Both parents are deceased.  The patient's mother died of a stroke.  She had two brothers and a sister.  One brother had prostate cancer.  There is no other reported cancer history.  The patient's father had two sisters and a brother.  There is no other reported cancer history.  William Phillips is unaware of previous family history of genetic testing for hereditary cancer risks. There is no reported Ashkenazi Jewish ancestry. There is no known consanguinity.  GENETIC COUNSELING ASSESSMENT: Mr. Plantz is a 72 y.o. male with a personal and family history of cancer which is somewhat suggestive of a hereditary cancer syndrome and predisposition to cancer given the combination of cancer and the patient's metastatic disease. We, therefore, discussed and recommended the following at today's visit.   DISCUSSION: We discussed that, in general, most cancer is not inherited in families, but instead is sporadic or familial. Sporadic cancers occur by chance and typically happen at older ages (>50 years) as this type of cancer is caused by genetic changes acquired  during an individual's lifetime. Some families have more cancers than would be expected by chance; however, the ages or types of cancer are not consistent with a known genetic mutation or known genetic mutations have been ruled out. This type of familial cancer is thought to be due to a combination of multiple genetic, environmental, hormonal, and lifestyle factors. While this combination of factors likely increases the risk of cancer, the exact source of this risk is not currently identifiable or testable.  We discussed that 5 - 10% of prostate cancer is hereditary, with most cases associated with BRCA mutations.  There are other genes that can be associated with hereditary prostate cancer syndromes.  These include ATM, PALB2 and HOXB13.  We discussed that testing is beneficial for several reasons including knowing how to follow individuals after completing their treatment, identifying whether potential treatment options such as PARP inhibitors would be beneficial, and understand if other family members could be at risk for cancer and allow them to undergo genetic testing.   We reviewed the characteristics, features and inheritance patterns of hereditary cancer syndromes. We also discussed genetic testing, including the appropriate family members to test, the process of testing, insurance coverage and turn-around-time for results. We discussed the implications of a negative, positive, carrier and/or variant of uncertain significant result. William Phillips  was offered a common hereditary cancer panel (36+ genes) and an expanded pan-cancer panel (70+ genes). William Phillips was informed of the benefits and limitations of each panel, including that expanded pan-cancer panels contain genes that do not have clear management guidelines at this point in time.  We also discussed that as the number of genes included on a panel increases, the chances of variants of uncertain significance increases. William Phillips decided to pursue  genetic testing for the CancerNext-Expanded+RNAinsight gene panel.   The CancerNext-Expanded gene panel offered by [facility] and includes sequencing, rearrangement, and RNA analysis for the following 77 genes: AIP, ALK, APC,  ATM, BAP1, BARD1, BMPR1A, BRCA1, BRCA2, BRIP1, CDC73, CDH1, CDK4, CDKN1B, CDKN2A, CEBPA, CHEK2, CTNNA1, DDX41, DICER1, ETV6, FH, FLCN, GATA2, LZTR1, MAX, MBD4, MEN1, MET, MLH1, MSH2, MSH3, MSH6, MUTYH, NF1, NF2, NTHL1, PALB2, PHOX2B, PMS2, POT1, PRKAR1A, PTCH1, PTEN, RAD51C, RAD51D, RB1, RET, RPS20, RUNX1, SDHA, SDHAF2, SDHB, SDHC, SDHD, SMAD4, SMARCA4, SMARCB1, SMARCE1, STK11, SUFU, TMEM127, TP53, TSC1, TSC2, VHL, and WT1 (sequencing and deletion/duplication); AXIN2, CTNNA1, DDX41, EGFR, HOXB13, KIT, MBD4, MITF, MSH3, PDGFRA, POLD1 and POLE (sequencing only); EPCAM and GREM1 (deletion/duplication only). RNA data is routinely analyzed for use in variant interpretation for all genes.   Based on William Phillips personal and family history of cancer, he meets medical criteria for genetic testing. Despite that he meets criteria, he may still have an out of pocket cost. We discussed that if his out of pocket cost for testing is over $100, the laboratory will call and confirm whether he wants to proceed with testing.  If the out of pocket cost of testing is less than $100 he will be billed by the genetic testing laboratory.   PLAN: After considering the risks, benefits, and limitations, William Phillips provided informed consent to pursue genetic testing.  A blood draw is scheduled for May 05, 2024 and will be sent to [facility] for analysis of the CancerNext-Expanded+RNAinsight. Results should be available within approximately 2-3 weeks' time, at which point they will be disclosed by telephone to William Phillips, as will any additional recommendations warranted by these results. William Phillips will receive a summary of his genetic counseling visit and a copy of his results once  available. This information will also be available in Epic.   Lastly, we encouraged William Phillips to remain in contact with cancer genetics annually so that we can continuously update the family history and inform him of any changes in cancer genetics and testing that may be of benefit for this family.   William Phillips questions were answered to his satisfaction today. Our contact information was provided should additional questions or concerns arise. Thank you for the referral and allowing us  to share in the care of your patient.   Jden Want P. Perri, MS, CGC Licensed, Patent attorney [email or web address] phone: [phone]  In total, 60 minutes were spent on the date of the encounter in service to the patient including preparation, face-to-face consultation, documentation and care coordination.  The patient was seen alone.  Drs. Lanny Stalls, and/or Gudena were available for questions, if needed..    _______________________________________________________________________ For Office Staff:  Number of people involved in session: 1 Was an Intern/ student involved with case: no

## 2024-05-05 ENCOUNTER — Inpatient Hospital Stay

## 2024-05-05 ENCOUNTER — Inpatient Hospital Stay: Admitting: Hematology & Oncology

## 2024-05-05 ENCOUNTER — Encounter: Payer: Self-pay | Admitting: *Deleted

## 2024-05-05 VITALS — BP 102/72 | HR 62 | Temp 98.1°F | Resp 16 | Ht 72.0 in | Wt 221.4 lb

## 2024-05-05 DIAGNOSIS — Z803 Family history of malignant neoplasm of breast: Secondary | ICD-10-CM | POA: Diagnosis not present

## 2024-05-05 DIAGNOSIS — C7951 Secondary malignant neoplasm of bone: Secondary | ICD-10-CM

## 2024-05-05 DIAGNOSIS — Z79899 Other long term (current) drug therapy: Secondary | ICD-10-CM | POA: Diagnosis not present

## 2024-05-05 DIAGNOSIS — C61 Malignant neoplasm of prostate: Secondary | ICD-10-CM

## 2024-05-05 DIAGNOSIS — Z8042 Family history of malignant neoplasm of prostate: Secondary | ICD-10-CM

## 2024-05-05 LAB — CBC WITH DIFFERENTIAL (CANCER CENTER ONLY)
Abs Immature Granulocytes: 0.02 K/uL (ref 0.00–0.07)
Basophils Absolute: 0 K/uL (ref 0.0–0.1)
Basophils Relative: 1 %
Eosinophils Absolute: 0.5 K/uL (ref 0.0–0.5)
Eosinophils Relative: 11 %
HCT: 37.8 % — ABNORMAL LOW (ref 39.0–52.0)
Hemoglobin: 12.6 g/dL — ABNORMAL LOW (ref 13.0–17.0)
Immature Granulocytes: 1 %
Lymphocytes Relative: 27 %
Lymphs Abs: 1.2 K/uL (ref 0.7–4.0)
MCH: 33.8 pg (ref 26.0–34.0)
MCHC: 33.3 g/dL (ref 30.0–36.0)
MCV: 101.3 fL — ABNORMAL HIGH (ref 80.0–100.0)
Monocytes Absolute: 0.6 K/uL (ref 0.1–1.0)
Monocytes Relative: 14 %
Neutro Abs: 2 K/uL (ref 1.7–7.7)
Neutrophils Relative %: 46 %
Platelet Count: 165 K/uL (ref 150–400)
RBC: 3.73 MIL/uL — ABNORMAL LOW (ref 4.22–5.81)
RDW: 12.9 % (ref 11.5–15.5)
WBC Count: 4.2 K/uL (ref 4.0–10.5)
nRBC: 0 % (ref 0.0–0.2)

## 2024-05-05 LAB — CMP (CANCER CENTER ONLY)
ALT: 22 U/L (ref 0–44)
AST: 22 U/L (ref 15–41)
Albumin: 4.3 g/dL (ref 3.5–5.0)
Alkaline Phosphatase: 51 U/L (ref 38–126)
Anion gap: 12 (ref 5–15)
BUN: 17 mg/dL (ref 8–23)
CO2: 26 mmol/L (ref 22–32)
Calcium: 9.3 mg/dL (ref 8.9–10.3)
Chloride: 107 mmol/L (ref 98–111)
Creatinine: 0.88 mg/dL (ref 0.61–1.24)
GFR, Estimated: >60 mL/min (ref >60)
Glucose, Bld: 132 mg/dL — ABNORMAL HIGH (ref 70–99)
Potassium: 4.9 mmol/L (ref 3.5–5.1)
Sodium: 145 mmol/L (ref 135–145)
Total Bilirubin: 0.5 mg/dL (ref 0.0–1.2)
Total Protein: 6.8 g/dL (ref 6.5–8.1)

## 2024-05-05 NOTE — Progress Notes (Signed)
 Hematology and Oncology Follow Up Visit  William Phillips [id number] Dec 01, 1950 73 y.o. 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 twice daily-started on 07/30/2023 Zometa  4 mg IV every 3 months -next on 06/2024  Status post radiotherapy to right/left iliac and L1    Interim History:  William Phillips is back for follow-up.  We did do a CT scan of his hip.  This thankfully is only showed arthritis.  This was of the right hip.  He went to Orthopedic Surgery.  He had an injection which worked very well for him.  Currently, his PSA is less than 0.1.  He has had no problems with bleeding.  I see that his hemoglobin is slowly trending downward.  I have to suspect that this probably is because of lack of testosterone .  He has had no fever.  He has had no rashes.  He has had no change in bowel or bladder habits.  He had a little bit of tenderness around the  right breast after the Zometa .  This may have been from the underlying rib.  Overall, I would say his performance status is probably ECOG 1.     Medications:  Current Outpatient Medications:    Accu-Chek Softclix Lancets lancets, Use as instructed, Disp: 100 each, Rfl: 12   amLODipine  (NORVASC ) 10 MG tablet, Take 10 mg by mouth daily., Disp: , Rfl:    apixaban  (ELIQUIS ) 5 MG TABS tablet, TAKE 1 TABLET BY MOUTH TWICE  DAILY, Disp: 200 tablet, Rfl: 1   Ascorbic Acid (VITAMIN C PO), Take 1 tablet by mouth daily., Disp: , Rfl:    Blood Glucose Monitoring Suppl DEVI, 1 each by Does not apply route in the morning, at noon, and at bedtime. May substitute to any manufacturer covered by patient's insurance., Disp: 1 each, Rfl: 0   Brimonidine Tartrate (LUMIFY) 0.025 % SOLN, Place 1 drop into both eyes every morning., Disp: , Rfl:    Calcium  Carbonate-Vit D-Min (CALCIUM  1200 PO), Take 1,200 mg  by mouth daily., Disp: , Rfl:    Carboxymethylcellulose Sodium (LUBRICANT EYE DROPS OP), Place 1 drop into both eyes 2 (two) times daily as needed (Dry eye)., Disp: , Rfl:    Clobetasol Propionate 0.05 % shampoo, Apply 1 application topically daily as needed (irritation)., Disp: , Rfl:    clotrimazole  (CLOTRIMAZOLE  ANTI-FUNGAL) 1 % cream, Apply 1 Application topically 2 (two) times daily., Disp: 30 g, Rfl: 0   darolutamide  (NUBEQA ) 300 MG tablet, Take 2 tablets (600 mg total) by mouth 2 (two) times daily with a meal., Disp: 120 tablet, Rfl: 3   fexofenadine  (ALLEGRA ) 180 MG tablet, Take 1 tablet (180 mg total) by mouth daily., Disp: , Rfl:    fluticasone  (FLONASE ) 50 MCG/ACT nasal spray, Place 2 sprays into both nostrils daily as needed for allergies or rhinitis., Disp: , Rfl: 2   furosemide  (LASIX ) 20 MG tablet, Take 1 tablet (20 mg total) by mouth daily., Disp: 2 tablet, Rfl: 0   glimepiride  (AMARYL ) 2 MG tablet, Take 1 tablet (2 mg total) by mouth daily with breakfast., Disp: 30 tablet, Rfl: 5   glucose blood test strip, Use as instructed, Disp: 100 each, Rfl: 12   ketoconazole (NIZORAL) 2 % shampoo, Apply 1 application topically daily as needed for irritation., Disp: , Rfl:    lisinopril  (ZESTRIL ) 10 MG tablet, Take 1 tablet (  10 mg total) by mouth daily., Disp: 90 tablet, Rfl: 3   metoprolol  succinate (TOPROL -XL) 50 MG 24 hr tablet, TAKE 1 TABLET BY MOUTH TWICE  DAILY WITH OR IMMEDIATELY  FOLLOWING A MEAL, Disp: 200 tablet, Rfl: 2   Multiple Vitamin (MULTIVITAMIN) tablet, Take 1 tablet by mouth every evening., Disp: , Rfl:    omeprazole  (PRILOSEC) 20 MG capsule, TAKE 1 CAPSULE BY MOUTH DAILY, Disp: 100 capsule, Rfl: 2   ondansetron  (ZOFRAN ) 4 MG tablet, Take 1 tablet (4 mg total) by mouth every 8 (eight) hours as needed for nausea or vomiting., Disp: 40 tablet, Rfl: 3   oxyCODONE -acetaminophen  (PERCOCET) 5-325 MG tablet, Take 1 tablet by mouth every 6 (six) hours as needed., Disp: 10 tablet, Rfl:  0   rosuvastatin  (CRESTOR ) 5 MG tablet, TAKE 1 TABLET (5 MG TOTAL) BY MOUTH DAILY., Disp: 90 tablet, Rfl: 3   sildenafil  (REVATIO ) 20 MG tablet, TAKE 1 TO 4 TABLETS BY MOUTH DAILY AS NEEDED, Disp: 90 tablet, Rfl: 0   silodosin  (RAPAFLO ) 8 MG CAPS capsule, Take 1 capsule (8 mg total) by mouth daily with breakfast., Disp: 90 capsule, Rfl: 3   sulfamethoxazole -trimethoprim  (BACTRIM  DS) 800-160 MG tablet, Take 1 tablet by mouth 2 (two) times daily., Disp: 14 tablet, Rfl: 4   triamcinolone  cream (KENALOG ) 0.1 %, Apply 1 Application topically 3 (three) times daily., Disp: , Rfl:    VITAMIN D PO, Take 1 capsule by mouth daily. 1000 units daily., Disp: , Rfl:    potassium chloride  SA (KLOR-CON  M) 20 MEQ tablet, Take 1 tablet (20 mEq total) by mouth daily for 2 days. (Patient not taking: Reported on 05/05/2024), Disp: 2 tablet, Rfl: 0  Current Facility-Administered Medications:    leuprolide  (6 Month) (LUPRON ) injection 45 mg, 45 mg, Intramuscular, Once, William Phillips, William Phillips., MD  Allergies:  Allergies  Allergen Reactions   Penicillins Swelling    irriation    Past Medical History, Surgical history, Social history, and Family History were reviewed and updated.  Review of Systems: Review of Systems  Constitutional: Negative.   HENT:  Negative.    Eyes: Negative.   Respiratory: Negative.    Cardiovascular: Negative.   Gastrointestinal: Negative.   Endocrine: Negative.   Musculoskeletal:  Positive for back pain and flank pain.  Neurological: Negative.   Hematological: Negative.   Psychiatric/Behavioral: Negative.      Physical Exam: Vital signs are temperature of 99.  Pulse 68.  Blood pressure 122/54.  Weight is 218 pounds.   Wt Readings from Last 3 Encounters:  05/05/24 221 lb 6.4 oz (100.4 kg)  04/12/24 220 lb (99.8 kg)  04/07/24 218 lb (98.9 kg)    Physical Exam Vitals reviewed.  HENT:     Head: Normocephalic and atraumatic.  Eyes:     Pupils: Pupils are equal, round, and  reactive to light.  Cardiovascular:     Rate and Rhythm: Normal rate and regular rhythm.     Heart sounds: Normal heart sounds.  Pulmonary:     Effort: Pulmonary effort is normal.     Breath sounds: Normal breath sounds.  Abdominal:     General: Bowel sounds are normal.     Palpations: Abdomen is soft.  Musculoskeletal:        General: No tenderness or deformity. Normal range of motion.     Cervical back: Normal range of motion.  Lymphadenopathy:     Cervical: No cervical adenopathy.  Skin:    General: Skin is warm and dry.  Findings: No erythema or rash.  Neurological:     Mental Status: He is alert and oriented to person, place, and time.  Psychiatric:        Behavior: Behavior normal.        Thought Content: Thought content normal.        Judgment: Judgment normal.      Lab Results  Component Value Date   WBC 4.2 05/05/2024   HGB 12.6 (L) 05/05/2024   HCT 37.8 (L) 05/05/2024   MCV 101.3 (H) 05/05/2024   PLT 165 05/05/2024     Chemistry      Component Value Date/Time   NA 145 05/05/2024 0950   NA 139 08/05/2022 1606   K 4.9 05/05/2024 0950   CL 107 05/05/2024 0950   CO2 26 05/05/2024 0950   BUN 17 05/05/2024 0950   BUN 10 08/05/2022 1606   CREATININE 0.88 05/05/2024 0950   CREATININE 0.77 11/29/2014 1512      Component Value Date/Time   CALCIUM  9.3 05/05/2024 0950   ALKPHOS 51 05/05/2024 0950   AST 22 05/05/2024 0950   ALT 22 05/05/2024 0950   BILITOT 0.5 05/05/2024 0950      Impression and Plan: Mr. Kuehl is a very nice 73 year old white male.  He has metastatic prostate cancer.  He has had radiotherapy.  This has helped a little bit.  His last PET scan was done back in May.  I think would be a good idea to get another 1 before I see him back.  I will see back in 1 in September.  After that, we will then see how everything looks.   Maude JONELLE Crease, MD 7/24/202511:00 AM

## 2024-05-05 NOTE — Progress Notes (Signed)
 Patient is doing well on current treatment. He will ned a repeat specialty PET prior to his next appointment. These will be scheduled by that department. Will follow up closer to that date.  Oncology Nurse Navigator Documentation     05/05/2024   10:00 AM  Oncology Nurse Navigator Flowsheets  Navigator Follow Up Date: 06/30/2024  Navigator Follow Up Reason: Follow-up Appointment  Navigator Location [facility]  Navigator Encounter Type Follow-up Appt  Patient Visit Type MedOnc  Treatment Phase Active Tx  Barriers/Navigation Needs Coordination of Care  Interventions None Required  Acuity Level 1-No Barriers  Support Groups/Services Friends and Family  Time Spent with Patient 15

## 2024-05-06 LAB — PSA, TOTAL AND FREE
PSA, Free Pct: UNDETERMINED %
PSA, Free: <0.02 ng/mL
Prostate Specific Ag, Serum: <0.1 ng/mL (ref 0.0–4.0)

## 2024-05-06 LAB — TESTOSTERONE: Testosterone: 12 ng/dL — ABNORMAL LOW (ref 264–916)

## 2024-05-12 ENCOUNTER — Telehealth: Payer: Self-pay | Admitting: *Deleted

## 2024-05-12 NOTE — Telephone Encounter (Signed)
 Received a call from [facility] stating that they received a sample but no genetic testing orders.  Contacted Darice Monte since she saw patient in her office and genetic testing was sent.

## 2024-05-18 DIAGNOSIS — M533 Sacrococcygeal disorders, not elsewhere classified: Secondary | ICD-10-CM | POA: Diagnosis not present

## 2024-05-18 DIAGNOSIS — M5459 Other low back pain: Secondary | ICD-10-CM | POA: Diagnosis not present

## 2024-05-24 ENCOUNTER — Encounter: Payer: Self-pay | Admitting: Genetic Counselor

## 2024-05-24 ENCOUNTER — Telehealth: Payer: Self-pay | Admitting: Genetic Counselor

## 2024-05-24 DIAGNOSIS — Z1379 Encounter for other screening for genetic and chromosomal anomalies: Secondary | ICD-10-CM | POA: Insufficient documentation

## 2024-05-24 NOTE — Telephone Encounter (Signed)
 Revealed negative genetic testing.  Discussed that we do not know why he has prostate cancer or why there is cancer in the family. It could be due to a different gene that we are not testing, or maybe our current technology may not be able to pick something up.  It will be important for him to keep in contact with genetics to keep up with whether additional testing may be needed.

## 2024-05-27 ENCOUNTER — Ambulatory Visit: Payer: Self-pay | Admitting: Genetic Counselor

## 2024-05-27 DIAGNOSIS — Z1379 Encounter for other screening for genetic and chromosomal anomalies: Secondary | ICD-10-CM

## 2024-05-27 NOTE — Progress Notes (Signed)
 HPI:  William Phillips was previously seen in the [facility] due to a personal and family history of cancer and concerns regarding a hereditary predisposition to cancer. Please refer to our prior cancer genetics clinic note for more information regarding our discussion, assessment and recommendations, at the time. William Phillips recent genetic test results were disclosed to him, as were recommendations warranted by these results. These results and recommendations are discussed in more detail below.  CANCER HISTORY:  Oncology History  Prostate cancer [facility]); PSA 5.3; GG 1,2; unfavorable intermediate risk  07/02/2023 Initial Diagnosis   Prostate cancer (HCC); PSA 5.3; GG 1,2; unfavorable intermediate risk   07/29/2023 Cancer Staging   Staging form: Prostate, AJCC 8th Edition - Clinical: Stage IVB (cT2b, cN0, pM1, PSA: 5.3, Grade Group: 3) - Signed by Timmy Maude SAUNDERS, MD on 07/29/2023 Stage prefix: Initial diagnosis Prostate specific antigen (PSA) range: Less than 10 Gleason score: 7 Histologic grading system: 5 grade system   05/23/2024 Genetic Testing   Negative genetic testing on the CancerNext-Expanded+RNAinsight panel.  The report date is May 23, 2024.  The CancerNext-Expanded gene panel offered by [facility] and includes sequencing, rearrangement, and RNA analysis for the following 77 genes: AIP, ALK, APC, ATM, BAP1, BARD1, BMPR1A, BRCA1, BRCA2, BRIP1, CDC73, CDH1, CDK4, CDKN1B, CDKN2A, CEBPA, CHEK2, CTNNA1, DDX41, DICER1, ETV6, FH, FLCN, GATA2, LZTR1, MAX, MBD4, MEN1, MET, MLH1, MSH2, MSH3, MSH6, MUTYH, NF1, NF2, NTHL1, PALB2, PHOX2B, PMS2, POT1, PRKAR1A, PTCH1, PTEN, RAD51C, RAD51D, RB1, RET, RPS20, RUNX1, SDHA, SDHAF2, SDHB, SDHC, SDHD, SMAD4, SMARCA4, SMARCB1, SMARCE1, STK11, SUFU, TMEM127, TP53, TSC1, TSC2, VHL, and WT1 (sequencing and deletion/duplication); AXIN2, CTNNA1, DDX41, EGFR, HOXB13, KIT, MBD4, MITF, MSH3, PDGFRA, POLD1 and POLE (sequencing only); EPCAM and  GREM1 (deletion/duplication only). RNA data is routinely analyzed for use in variant interpretation for all genes.      FAMILY HISTORY:  We obtained a detailed, 4-generation family history.  Significant diagnoses are listed below: Family History  Problem Relation Age of Onset   Arthritis Mother    Stroke Mother    Coronary artery disease Father        aortic valve disease   Heart attack Father 62   COPD Father    Hypertension Father    Hyperlipidemia Father    Dementia Father    Breast cancer Sister 31   Diabetes Sister    Prostate cancer Maternal Uncle    Diabetes type II Maternal Grandfather    Diabetes Maternal Grandfather    Hyperlipidemia Other    Hypertension Other          The patient has an adopted daughter who is cancer free.  He has a sister and brother.  The sister was diagnosed with breast cancer at 92.  Both parents are deceased.   The patient's mother died of a stroke.  She had two brothers and a sister.  One brother had prostate cancer.  There is no other reported cancer history.   The patient's father had two sisters and a brother.  There is no other reported cancer history.   William Phillips is unaware of previous family history of genetic testing for hereditary cancer risks. There is no reported Ashkenazi Jewish ancestry. There is no known consanguinity  GENETIC TEST RESULTS: Genetic testing reported out on May 23, 2024 through the CancerNext-Expanded+RNAinsight cancer panel found no pathogenic mutations. The CancerNext-Expanded gene panel offered by [facility] and includes sequencing, rearrangement, and RNA analysis for the following 77 genes: AIP, ALK,  APC, ATM, BAP1, BARD1, BMPR1A, BRCA1, BRCA2, BRIP1, CDC73, CDH1, CDK4, CDKN1B, CDKN2A, CEBPA, CHEK2, CTNNA1, DDX41, DICER1, ETV6, FH, FLCN, GATA2, LZTR1, MAX, MBD4, MEN1, MET, MLH1, MSH2, MSH3, MSH6, MUTYH, NF1, NF2, NTHL1, PALB2, PHOX2B, PMS2, POT1, PRKAR1A, PTCH1, PTEN, RAD51C, RAD51D, RB1, RET, RPS20,  RUNX1, SDHA, SDHAF2, SDHB, SDHC, SDHD, SMAD4, SMARCA4, SMARCB1, SMARCE1, STK11, SUFU, TMEM127, TP53, TSC1, TSC2, VHL, and WT1 (sequencing and deletion/duplication); AXIN2, CTNNA1, DDX41, EGFR, HOXB13, KIT, MBD4, MITF, MSH3, PDGFRA, POLD1 and POLE (sequencing only); EPCAM and GREM1 (deletion/duplication only). RNA data is routinely analyzed for use in variant interpretation for all genes. The test report has been scanned into EPIC and is located under the Molecular Pathology section of the Results Review tab.  A portion of the result report is included below for reference.     We discussed with William Phillips that because current genetic testing is not perfect, it is possible there may be a gene mutation in one of these genes that current testing cannot detect, but that chance is small.  We also discussed, that there could be another gene that has not yet been discovered, or that we have not yet tested, that is responsible for the cancer diagnoses in the family. It is also possible there is a hereditary cause for the cancer in the family that William Phillips did not inherit and therefore was not identified in his testing.  Therefore, it is important to remain in touch with cancer genetics in the future so that we can continue to offer William Phillips the most up to date genetic testing.   ADDITIONAL GENETIC TESTING: We discussed with William Phillips that his genetic testing was fairly extensive.  If there are genes identified to increase cancer risk that can be analyzed in the future, we would be happy to discuss and coordinate this testing at that time.    CANCER SCREENING RECOMMENDATIONS: William Phillips test result is considered negative (normal).  This means that we have not identified a hereditary cause for his personal and family history of cancer at this time. Most cancers happen by chance and this negative test suggests that his personal and family history of cancer may fall into this category.    Possible reasons  for William Phillips negative genetic test include:  1. There may be a gene mutation in one of these genes that current testing methods cannot detect but that chance is small.  2. There could be another gene that has not yet been discovered, or that we have not yet tested, that is responsible for the cancer diagnoses in the family.  3.  There may be no hereditary risk for cancer in the family. The cancers in William Phillips and/or his family may be sporadic/familial or due to other genetic and environmental factors. 4. It is also possible there is a hereditary cause for the cancer in the family that William Phillips did not inherit.  Therefore, it is recommended he continue to follow the cancer management and screening guidelines provided by his oncology and primary healthcare provider. An individual's cancer risk and medical management are not determined by genetic test results alone. Overall cancer risk assessment incorporates additional factors, including personal medical history, family history, and any available genetic information that may result in a personalized plan for cancer prevention and surveillance  RECOMMENDATIONS FOR FAMILY MEMBERS:   Since he did not inherit a identifiable mutation in a cancer predisposition gene included on this panel, his children could not have inherited a known mutation from  his in one of these genes. Individuals in this family might be at some increased risk of developing cancer, over the general population risk, simply due to the family history of cancer.  We recommended women in this family have a yearly mammogram beginning at age 43, or 64 years younger than the earliest onset of cancer, an annual clinical breast exam, and perform monthly breast self-exams. Women in this family should also have a gynecological exam as recommended by their primary provider. All family members should be referred for colonoscopy starting at age 42, or 48 years younger than the earliest onset of  cancer.  FOLLOW-UP: Lastly, we discussed with William Phillips that cancer genetics is a rapidly advancing field and it is possible that new genetic tests will be appropriate for him and/or his family members in the future. We encouraged him to remain in contact with cancer genetics on an annual basis so we can update his personal and family histories and let him know of advances in cancer genetics that may benefit this family.   Our contact number was provided. William Phillips questions were answered to his satisfaction, and he knows he is welcome to call us  at anytime with additional questions or concerns.   Darice Monte, MS, [facility] Licensed, Certified Genetic Counselor [email or web address]

## 2024-05-30 DIAGNOSIS — L738 Other specified follicular disorders: Secondary | ICD-10-CM | POA: Diagnosis not present

## 2024-05-31 DIAGNOSIS — M545 Low back pain, unspecified: Secondary | ICD-10-CM | POA: Diagnosis not present

## 2024-06-01 ENCOUNTER — Ambulatory Visit: Attending: Cardiology | Admitting: Cardiovascular Disease

## 2024-06-01 ENCOUNTER — Encounter: Payer: Self-pay | Admitting: Cardiovascular Disease

## 2024-06-01 VITALS — BP 120/64 | HR 67 | Ht 72.0 in | Wt 221.0 lb

## 2024-06-01 DIAGNOSIS — I4892 Unspecified atrial flutter: Secondary | ICD-10-CM

## 2024-06-01 DIAGNOSIS — I4819 Other persistent atrial fibrillation: Secondary | ICD-10-CM

## 2024-06-01 NOTE — Progress Notes (Signed)
 Cardiology Office Note:    Date:  06/01/2024   ID:  William Phillips, DOB 1951/01/30, MRN [id number]  PCP:  Domenica Harlene LABOR, MD   [facility] Providers Cardiologist:  Redell Shallow, MD Electrophysiologist:  Eulas FORBES Furbish, MD     Referring MD: Domenica Harlene LABOR, MD   Chief complaint: fatigue  History of Present Illness:    William Phillips is a 73 y.o. male with a hx of aflutter ablation x 2 with Dr. Kelsie, in 2016 and again in 09/2021.  Atrial fibrillation  was recently detected by his PCP.  The patient had noticed some increased exertional dyspnea.  Eliquis  -- which has been discontinued -- and metoprolol  were restarted.  He underwent DC cardioversion on July 14, 2022, but was again noted to be in atrial fibrillation in follow-up October 17.  He reports that early in the summer, he was frequently going on walks with his wife.  At some point he noticed a decrease in his energy levels.  He lost interest in going to walks and began to take naps in the late afternoon and early evenings.  This appeared to correlate with onset of his atrial fibrillation.  He underwent repeat ablation for AF in November 2023. Since, he reports that he has not had any symptoms to suggest recurrence of atrial fibrillation.  He has no complaints today--no palpitations, shortness of breath, presyncope, chest pain.     EKGs/Labs/Other Studies Reviewed:     TTE 06/21/2021  1. Left ventricular ejection fraction, by estimation, is 50 to 55%. The  left ventricle has low normal function. The left ventricle has no regional  wall motion abnormalities. Left ventricular diastolic parameters are  consistent with Grade I diastolic  dysfunction (impaired relaxation).   2. Right ventricular systolic function is normal. The right ventricular  size is normal.   3. The mitral valve is normal in structure. No evidence of mitral valve  regurgitation. No evidence of mitral stenosis.   4. The aortic valve  is normal in structure. Aortic valve regurgitation is  trivial. Mild aortic valve sclerosis is present, with no evidence of  aortic valve stenosis.   5. The inferior vena cava is normal in size with greater than 50%  respiratory variability, suggesting right atrial pressure of 3 mmHg.    EKG:  Last EKG results: today - sinus rhythm with PACs    Recent Labs: 12/29/2023: TSH 1.18 04/06/2024: Pro B Natriuretic peptide (BNP) 132.0 05/05/2024: ALT 22; BUN 17; Creatinine 0.88; Hemoglobin 12.6; Platelet Count 165; Potassium 4.9; Sodium 145         Physical Exam:    VS:  BP 120/64 (BP Location: Right Arm, Patient Position: Sitting, Cuff Size: Large)   Pulse 67   Ht 6' (1.829 m)   Wt 221 lb (100.2 kg)   SpO2 95%   BMI 29.97 kg/m     Wt Readings from Last 3 Encounters:  06/01/24 221 lb (100.2 kg)  05/05/24 221 lb 6.4 oz (100.4 kg)  04/12/24 220 lb (99.8 kg)     GEN: Well nourished, well developed in no acute distress CARDIAC: irregular, no murmurs, rubs, gallops RESPIRATORY:  Normal work of breathing MUSCULOSKELETAL: no edema    ASSESSMENT & PLAN:    Atrial fibrillation:  was symptomatic despite rate control.  Now maintaining sinus rhythm after ablation.  Continue metoprolol  XL 50mg  daily Monitoring with Apple Watch  Typical atrial flutter: appears to be controlled after ablation #2  Secondary hypercoagulable  state: Continue Eliquis  5mg  PO BID       Follow-up 1 year   Medication Adjustments/Labs and Tests Ordered: Current medicines are reviewed at length with the patient today.  Concerns regarding medicines are outlined above.  Orders Placed This Encounter  Procedures   EKG 12-Lead   No orders of the defined types were placed in this encounter.    Signed, Eulas FORBES Furbish, MD  06/01/2024 2:02 PM    [facility]

## 2024-06-01 NOTE — Patient Instructions (Signed)
 Medication Instructions:  Your physician recommends that you continue on your current medications as directed. Please refer to the Current Medication list given to you today.  *If you need a refill on your cardiac medications before your next appointment, please call your pharmacy*  Lab Work: None ordered.  If you have labs (blood work) drawn today and your tests are completely normal, you will receive your results only by: MyChart Message (if you have MyChart) OR A paper copy in the mail If you have any lab test that is abnormal or we need to change your treatment, we will call you to review the results.  Testing/Procedures: None ordered.   Follow-Up: At [facility], you and your health needs are our priority.  As part of our continuing mission to provide you with exceptional heart care, our providers are all part of one team.  This team includes your primary Cardiologist (physician) and Advanced Practice Providers or APPs (Physician Assistants and Nurse Practitioners) who all work together to provide you with the care you need, when you need it.  Your next appointment:   12 months with Dr Katheryne Pane PA

## 2024-06-08 DIAGNOSIS — M533 Sacrococcygeal disorders, not elsewhere classified: Secondary | ICD-10-CM | POA: Diagnosis not present

## 2024-06-08 DIAGNOSIS — M5416 Radiculopathy, lumbar region: Secondary | ICD-10-CM | POA: Diagnosis not present

## 2024-06-08 DIAGNOSIS — G8929 Other chronic pain: Secondary | ICD-10-CM | POA: Diagnosis not present

## 2024-06-08 DIAGNOSIS — M5459 Other low back pain: Secondary | ICD-10-CM | POA: Diagnosis not present

## 2024-06-13 ENCOUNTER — Other Ambulatory Visit: Payer: Self-pay | Admitting: Hematology & Oncology

## 2024-06-14 ENCOUNTER — Other Ambulatory Visit: Payer: Self-pay | Admitting: Cardiology

## 2024-06-14 ENCOUNTER — Other Ambulatory Visit: Payer: Self-pay | Admitting: Urology

## 2024-06-14 DIAGNOSIS — I4892 Unspecified atrial flutter: Secondary | ICD-10-CM

## 2024-06-14 DIAGNOSIS — N138 Other obstructive and reflux uropathy: Secondary | ICD-10-CM

## 2024-06-14 NOTE — Telephone Encounter (Signed)
 Prescription refill request for Eliquis  received. Indication:afib Last office visit:8/25 Scr:0.88  7/25 Age: 73 Weight:100.2  kg  Prescription refilled

## 2024-06-16 ENCOUNTER — Encounter (HOSPITAL_COMMUNITY)
Admission: RE | Admit: 2024-06-16 | Discharge: 2024-06-16 | Disposition: A | Discharge status: Home / Self Care | Source: Ambulatory Visit | Attending: Hematology & Oncology | Admitting: Hematology & Oncology

## 2024-06-16 DIAGNOSIS — C61 Malignant neoplasm of prostate: Secondary | ICD-10-CM | POA: Insufficient documentation

## 2024-06-16 DIAGNOSIS — C7951 Secondary malignant neoplasm of bone: Secondary | ICD-10-CM | POA: Diagnosis not present

## 2024-06-16 MED ORDER — FLOTUFOLASTAT F 18 GALLIUM 296-5846 MBQ/ML IV SOLN
8.4200 | Freq: Once | INTRAVENOUS | Status: AC
  Administered 2024-06-16: 8.42 via INTRAVENOUS

## 2024-06-20 ENCOUNTER — Encounter: Payer: Self-pay | Admitting: *Deleted

## 2024-06-20 NOTE — Progress Notes (Signed)
 Reviewed PET which shows mixed response.   Oncology Nurse Navigator Documentation     06/20/2024    8:00 AM  Oncology Nurse Navigator Flowsheets  Navigator Follow Up Date: 06/30/2024  Navigator Follow Up Reason: Follow-up Appointment  Navigator Location [facility]  Navigator Encounter Type Scan Review  Patient Visit Type MedOnc  Treatment Phase Active Tx  Barriers/Navigation Needs Coordination of Care  Interventions None Required  Acuity Level 1-No Barriers  Support Groups/Services Friends and Family  Time Spent with Patient 15

## 2024-06-22 DIAGNOSIS — M5416 Radiculopathy, lumbar region: Secondary | ICD-10-CM | POA: Diagnosis not present

## 2024-06-29 ENCOUNTER — Other Ambulatory Visit: Payer: Self-pay

## 2024-06-29 DIAGNOSIS — C61 Malignant neoplasm of prostate: Secondary | ICD-10-CM

## 2024-06-29 DIAGNOSIS — C7951 Secondary malignant neoplasm of bone: Secondary | ICD-10-CM

## 2024-06-30 ENCOUNTER — Encounter: Payer: Self-pay | Admitting: *Deleted

## 2024-06-30 ENCOUNTER — Inpatient Hospital Stay: Admitting: Hematology & Oncology

## 2024-06-30 ENCOUNTER — Inpatient Hospital Stay

## 2024-06-30 ENCOUNTER — Inpatient Hospital Stay: Attending: Hematology & Oncology

## 2024-06-30 ENCOUNTER — Encounter: Payer: Self-pay | Admitting: Hematology & Oncology

## 2024-06-30 VITALS — BP 122/70 | HR 55 | Resp 18

## 2024-06-30 VITALS — BP 107/59 | HR 65 | Temp 98.4°F | Resp 19 | Wt 220.0 lb

## 2024-06-30 DIAGNOSIS — C61 Malignant neoplasm of prostate: Secondary | ICD-10-CM | POA: Insufficient documentation

## 2024-06-30 DIAGNOSIS — Z79899 Other long term (current) drug therapy: Secondary | ICD-10-CM | POA: Insufficient documentation

## 2024-06-30 DIAGNOSIS — E291 Testicular hypofunction: Secondary | ICD-10-CM | POA: Diagnosis not present

## 2024-06-30 DIAGNOSIS — C7951 Secondary malignant neoplasm of bone: Secondary | ICD-10-CM | POA: Diagnosis not present

## 2024-06-30 LAB — CBC WITH DIFFERENTIAL (CANCER CENTER ONLY)
Abs Immature Granulocytes: 0.04 K/uL (ref 0.00–0.07)
Basophils Absolute: 0 K/uL (ref 0.0–0.1)
Basophils Relative: 1 %
Eosinophils Absolute: 0.3 K/uL (ref 0.0–0.5)
Eosinophils Relative: 7 %
HCT: 37.6 % — ABNORMAL LOW (ref 39.0–52.0)
Hemoglobin: 12.9 g/dL — ABNORMAL LOW (ref 13.0–17.0)
Immature Granulocytes: 1 %
Lymphocytes Relative: 25 %
Lymphs Abs: 1.1 K/uL (ref 0.7–4.0)
MCH: 34.2 pg — ABNORMAL HIGH (ref 26.0–34.0)
MCHC: 34.3 g/dL (ref 30.0–36.0)
MCV: 99.7 fL (ref 80.0–100.0)
Monocytes Absolute: 0.6 K/uL (ref 0.1–1.0)
Monocytes Relative: 15 %
Neutro Abs: 2.2 K/uL (ref 1.7–7.7)
Neutrophils Relative %: 51 %
Platelet Count: 125 K/uL — ABNORMAL LOW (ref 150–400)
RBC: 3.77 MIL/uL — ABNORMAL LOW (ref 4.22–5.81)
RDW: 13.3 % (ref 11.5–15.5)
WBC Count: 4.3 K/uL (ref 4.0–10.5)
nRBC: 0 % (ref 0.0–0.2)

## 2024-06-30 LAB — CMP (CANCER CENTER ONLY)
ALT: 34 U/L (ref 0–44)
AST: 42 U/L — ABNORMAL HIGH (ref 15–41)
Albumin: 4.1 g/dL (ref 3.5–5.0)
Alkaline Phosphatase: 51 U/L (ref 38–126)
Anion gap: 13 (ref 5–15)
BUN: 14 mg/dL (ref 8–23)
CO2: 23 mmol/L (ref 22–32)
Calcium: 8.8 mg/dL — ABNORMAL LOW (ref 8.9–10.3)
Chloride: 105 mmol/L (ref 98–111)
Creatinine: 0.74 mg/dL (ref 0.61–1.24)
GFR, Estimated: >60 mL/min (ref >60)
Glucose, Bld: 135 mg/dL — ABNORMAL HIGH (ref 70–99)
Potassium: 4 mmol/L (ref 3.5–5.1)
Sodium: 141 mmol/L (ref 135–145)
Total Bilirubin: 0.5 mg/dL (ref 0.0–1.2)
Total Protein: 6.6 g/dL (ref 6.5–8.1)

## 2024-06-30 MED ORDER — SODIUM CHLORIDE 0.9 % IV SOLN: INTRAVENOUS | Status: DC

## 2024-06-30 MED ORDER — ZOLEDRONIC ACID 4 MG/100ML IV SOLN
4.0000 mg | Freq: Once | INTRAVENOUS | Status: AC
  Administered 2024-06-30: 4 mg via INTRAVENOUS
  Filled 2024-06-30: qty 100

## 2024-06-30 NOTE — Patient Instructions (Signed)

## 2024-06-30 NOTE — Progress Notes (Signed)
 Hematology and Oncology Follow Up Visit  William Phillips [id number] [birth year] 73 y.o. 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 twice daily-started on 07/30/2023 Zometa  4 mg IV every 3 months -next on 06/2024  Status post radiotherapy to right/left iliac and L1    Interim History:  William Phillips is back for follow-up.  We did do a PSMA PET scan on him.  This was done on 06/16/2024.  The PET scan did show that he had bony disease.  Again, looks at most areas were maybe stable or little bit less.  However there are couple areas that were more active.  He is having some pain in the back and in the hips.  I really think that since his disease is confined to the bones, that we should try to go for a targeted therapy to his bones.  Saying that, I think that Xofigo would not be a bad idea for him.  I think that this would be targeted for his bones.  I talked to he him and his wife about this.  I explained how Xofigo works.  I explained why I thought this would be a good option for him.  They agree.  I talked with Dr. Shannon of Radiation Oncology.  He has seen Mr. Coles before.  He will review the x-rays and see about getting him in.  Surprisingly, the PSA is still quite low.  When we last saw him, the PSA was less than 0.1.  His last testosterone  level was less than 12.  He does feel tired.  He does not have a lot of energy.  I have to believe this is from the low testosterone , in addition to possibly the metoprolol  that he is on.  There has been no fever.  He has had a decent appetite.  He has had no change in bowel or bladder habits.  He has had no leg swelling.  He has had no rashes.  Overall, I would say that his performance status is probably ECOG 1.   Medications:  Current Outpatient Medications:    Accu-Chek Softclix Lancets  lancets, Use as instructed, Disp: 100 each, Rfl: 12   amLODipine  (NORVASC ) 10 MG tablet, Take 10 mg by mouth daily., Disp: , Rfl:    Ascorbic Acid (VITAMIN C PO), Take 1 tablet by mouth daily., Disp: , Rfl:    Blood Glucose Monitoring Suppl DEVI, 1 each by Does not apply route in the morning, at noon, and at bedtime. May substitute to any manufacturer covered by patient's insurance., Disp: 1 each, Rfl: 0   Brimonidine Tartrate (LUMIFY) 0.025 % SOLN, Place 1 drop into both eyes every morning., Disp: , Rfl:    Calcium  Carbonate-Vit D-Min (CALCIUM  1200 PO), Take 1,200 mg by mouth daily., Disp: , Rfl:    Carboxymethylcellulose Sodium (LUBRICANT EYE DROPS OP), Place 1 drop into both eyes 2 (two) times daily as needed (Dry eye)., Disp: , Rfl:    Clobetasol Propionate 0.05 % shampoo, Apply 1 application topically daily as needed (irritation)., Disp: , Rfl:    clotrimazole  (CLOTRIMAZOLE  ANTI-FUNGAL) 1 % cream, Apply 1 Application topically 2 (two) times daily., Disp: 30 g, Rfl: 0   darolutamide  (NUBEQA ) 300 MG tablet, Take 2 tablets (600 mg total) by mouth 2 (two) times daily with a meal., Disp: 120 tablet, Rfl: 3   ELIQUIS  5 MG TABS  tablet, TAKE 1 TABLET BY MOUTH TWICE  DAILY, Disp: 200 tablet, Rfl: 2   fexofenadine  (ALLEGRA ) 180 MG tablet, Take 1 tablet (180 mg total) by mouth daily., Disp: , Rfl:    fluticasone  (FLONASE ) 50 MCG/ACT nasal spray, Place 2 sprays into both nostrils daily as needed for allergies or rhinitis., Disp: , Rfl: 2   furosemide  (LASIX ) 20 MG tablet, Take 1 tablet (20 mg total) by mouth daily., Disp: 2 tablet, Rfl: 0   glimepiride  (AMARYL ) 2 MG tablet, TAKE 1 TABLET BY MOUTH DAILY WITH BREAKFAST, Disp: 30 tablet, Rfl: 5   glucose blood test strip, Use as instructed, Disp: 100 each, Rfl: 12   ketoconazole (NIZORAL) 2 % shampoo, Apply 1 application topically daily as needed for irritation., Disp: , Rfl:    lisinopril  (ZESTRIL ) 10 MG tablet, Take 1 tablet (10 mg total) by mouth daily.,  Disp: 90 tablet, Rfl: 3   metoprolol  succinate (TOPROL -XL) 50 MG 24 hr tablet, TAKE 1 TABLET BY MOUTH TWICE  DAILY WITH OR IMMEDIATELY  FOLLOWING A MEAL, Disp: 200 tablet, Rfl: 2   Multiple Vitamin (MULTIVITAMIN) tablet, Take 1 tablet by mouth every evening., Disp: , Rfl:    omeprazole  (PRILOSEC) 20 MG capsule, TAKE 1 CAPSULE BY MOUTH DAILY, Disp: 100 capsule, Rfl: 2   ondansetron  (ZOFRAN ) 4 MG tablet, Take 1 tablet (4 mg total) by mouth every 8 (eight) hours as needed for nausea or vomiting., Disp: 40 tablet, Rfl: 3   oxyCODONE -acetaminophen  (PERCOCET) 5-325 MG tablet, Take 1 tablet by mouth every 6 (six) hours as needed., Disp: 10 tablet, Rfl: 0   rosuvastatin  (CRESTOR ) 5 MG tablet, TAKE 1 TABLET (5 MG TOTAL) BY MOUTH DAILY., Disp: 90 tablet, Rfl: 3   sildenafil  (REVATIO ) 20 MG tablet, TAKE 1 TO 4 TABLETS BY MOUTH DAILY AS NEEDED, Disp: 90 tablet, Rfl: 0   silodosin  (RAPAFLO ) 8 MG CAPS capsule, TAKE 1 CAPSULE BY MOUTH DAILY  WITH BREAKFAST, Disp: 100 capsule, Rfl: 2   triamcinolone  cream (KENALOG ) 0.1 %, Apply 1 Application topically 3 (three) times daily., Disp: , Rfl:    VITAMIN D PO, Take 1 capsule by mouth daily. 1000 units daily., Disp: , Rfl:    potassium chloride  SA (KLOR-CON  M) 20 MEQ tablet, Take 1 tablet (20 mEq total) by mouth daily for 2 days. (Patient not taking: Reported on 06/30/2024), Disp: 2 tablet, Rfl: 0   sulfamethoxazole -trimethoprim  (BACTRIM  DS) 800-160 MG tablet, Take 1 tablet by mouth 2 (two) times daily. (Patient not taking: Reported on 06/30/2024), Disp: 14 tablet, Rfl: 4  Current Facility-Administered Medications:    leuprolide  (6 Month) (LUPRON ) injection 45 mg, 45 mg, Intramuscular, Once, [place], Adine PARAS., MD  Allergies:  Allergies  Allergen Reactions   Penicillins Swelling    irriation    Past Medical History, Surgical history, Social history, and Family History were reviewed and updated.  Review of Systems: Review of Systems  Constitutional: Negative.    HENT:  Negative.    Eyes: Negative.   Respiratory: Negative.    Cardiovascular: Negative.   Gastrointestinal: Negative.   Endocrine: Negative.   Musculoskeletal:  Positive for back pain and flank pain.  Neurological: Negative.   Hematological: Negative.   Psychiatric/Behavioral: Negative.      Physical Exam: Vital signs are temperature of 98.4.  Pulse 65.  Blood pressure 107/59.  Weight is 220 pounds.    Wt Readings from Last 3 Encounters:  06/30/24 220 lb (99.8 kg)  06/01/24 221 lb (100.2 kg)  05/05/24 221  lb 6.4 oz (100.4 kg)    Physical Exam Vitals reviewed.  HENT:     Head: Normocephalic and atraumatic.  Eyes:     Pupils: Pupils are equal, round, and reactive to light.  Cardiovascular:     Rate and Rhythm: Normal rate and regular rhythm.     Heart sounds: Normal heart sounds.  Pulmonary:     Effort: Pulmonary effort is normal.     Breath sounds: Normal breath sounds.  Abdominal:     General: Bowel sounds are normal.     Palpations: Abdomen is soft.  Musculoskeletal:        General: No tenderness or deformity. Normal range of motion.     Cervical back: Normal range of motion.  Lymphadenopathy:     Cervical: No cervical adenopathy.  Skin:    General: Skin is warm and dry.     Findings: No erythema or rash.  Neurological:     Mental Status: He is alert and oriented to person, place, and time.  Psychiatric:        Behavior: Behavior normal.        Thought Content: Thought content normal.        Judgment: Judgment normal.      Lab Results  Component Value Date   WBC 4.3 06/30/2024   HGB 12.9 (L) 06/30/2024   HCT 37.6 (L) 06/30/2024   MCV 99.7 06/30/2024   PLT PENDING 06/30/2024     Chemistry      Component Value Date/Time   NA 145 05/05/2024 0950   NA 139 08/05/2022 1606   K 4.9 05/05/2024 0950   CL 107 05/05/2024 0950   CO2 26 05/05/2024 0950   BUN 17 05/05/2024 0950   BUN 10 08/05/2022 1606   CREATININE 0.88 05/05/2024 0950   CREATININE 0.77  11/29/2014 1512      Component Value Date/Time   CALCIUM  9.3 05/05/2024 0950   ALKPHOS 51 05/05/2024 0950   AST 22 05/05/2024 0950   ALT 22 05/05/2024 0950   BILITOT 0.5 05/05/2024 0950      Impression and Plan: Mr. Grealish is a very nice 73 year old white male.  He has metastatic prostate cancer.  He has had radiotherapy.  This has helped a little bit.  We will see about get him over to Radiation Oncology again.  I do think that Xofigo would not be a bad idea for him.  I was there is some else I can do to try to help get him to feel little bit better.  We will see what his PSA is at this time.  Would like to get him back in about a month or so.  By then, he should have had his treatment.    Maude JONELLE Crease, MD 9/18/20259:44 AM

## 2024-07-01 ENCOUNTER — Ambulatory Visit: Payer: Self-pay | Admitting: Hematology & Oncology

## 2024-07-01 LAB — PSA, TOTAL AND FREE
PSA, Free Pct: UNDETERMINED %
PSA, Free: <0.02 ng/mL
Prostate Specific Ag, Serum: <0.1 ng/mL (ref 0.0–4.0)

## 2024-07-01 LAB — TESTOSTERONE: Testosterone: 4 ng/dL — ABNORMAL LOW (ref 264–916)

## 2024-07-05 NOTE — Progress Notes (Signed)
 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 Walker? Wheelchair?: {VQI Ambulatory Status:20974}  SAFETY ISSUES: Prior radiation? {:18581} Pacemaker/ICD/loop recorder/dexcom? {:18581} Possible current pregnancy? {:18581} Is the patient on methotrexate? {:18581}  Current Complaints / other details:  ***

## 2024-07-06 ENCOUNTER — Encounter: Payer: Self-pay | Admitting: Hematology & Oncology

## 2024-07-06 NOTE — Progress Notes (Signed)
 Patient's bony mets continue to be active on PET. Dr Timmy would like him considered for Xofigo. Dr Shannon will schedule the patient for consultation.   Oncology Nurse Navigator Documentation     06/30/2024    9:15 AM  Oncology Nurse Navigator Flowsheets  Navigator Follow Up Date: 07/29/2024  Navigator Follow Up Reason: Follow-up Appointment  Navigator Location [facility]  Navigator Encounter Type Follow-up Appt  Patient Visit Type MedOnc  Treatment Phase Active Tx  Barriers/Navigation Needs Coordination of Care  Interventions None Required  Acuity Level 1-No Barriers  Support Groups/Services Friends and Family  Time Spent with Patient 15

## 2024-07-06 NOTE — Progress Notes (Signed)
 Radiation Oncology         [phone] ________________________________  Re- Consultation Note  Name: William Phillips MRN: [id number]  Date: 07/07/2024  DOB: 02-25-1951  RR:Aobuy, Harlene LABOR, MD  Timmy Maude SAUNDERS, MD   REFERRING PHYSICIAN: Timmy Maude SAUNDERS, MD  DIAGNOSIS: The primary encounter diagnosis was Prostate cancer [facility]); PSA 5.3; GG 1,2; unfavorable intermediate risk. 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   Bony metastases from stage IVB (cT2b, cN0, pM1) prostate cancer; s/p palliative radiation therapy completed on 11/27/2023.    HISTORY OF PRESENT ILLNESS: William Phillips is a 73 y.o. male who is seen as a courtesy of Dr. Timmy for an opinion concerning radiation therapy as part of management for his metastatic prostate cancer. He is known to me for his history of radiation for prostate cancer and was last seen in office in march of 2025 for a follow up by PA Wyatt. At that time, he reported that the palliative radiation improved his lower back and hip pain significantly.   Patient followed with Dr. Ozell Finney for consideration of other etiologies of his pain. MRI of the lumbar spine from 05/31/2024 demonstrated prominent fatty marrow changes with history of radiation prostate cancer, no active metastatic disease identified. L3-4 Modic 1 degenerative endplate changes. L3-S1 facet effusions. L3-4 moderate to severe central canal stenosis with ablation of CSF space and crowding of nerve roots. L4-5 mild subarticular abutment/displacement by posterolateral protrusion moderate to severe biforaminal stenosis with biforaminal L4 nerve root abutment. Dr. Finney gave the patient a steroid injection, but patient states his pain returned shortly after this.   Patient recently underwent a surveillance PSMA PET scan on 06/16/24 showing multifocal osseous metastasis, felt to be minimally progressive. Although some lesions are  similar and slightly decreased in tracer affinity, others are definitely progressive along with isolated preaortic node which has minimally enlarged and demonstrates increased, nonspecific tracer uptake. Cannot exclude isolated nodal metastasis.   He presented for a follow up with Dr. Timmy on 06/30/24 to discuss scan results and his increased back and hip pain. Per their discussion, they opted to proceed with radiation and initiation of Xofigo.   In the interval since he was last seen, he underwent genetic testing on 05/27/24 showing no pathogenic mutation contributing to his carcinoma diagnosis.   Most recent PSA was less than 0.1.   PREVIOUS RADIATION THERAPY: Yes  First Treatment Date: 2023-11-16 Last Treatment Date: 2023-11-27   Plan Name: Spine_L1 Site: Lumbar Spine Technique: 3D Mode: Photon Dose Per Fraction: 3 Gy Prescribed Dose (Delivered / Prescribed): 30 Gy / 30 Gy Prescribed Fxs (Delivered / Prescribed): 10 / 10   Plan Name: Pelvis_R Site: Ilium, Right Technique: 3D Mode: Photon Dose Per Fraction: 3 Gy Prescribed Dose (Delivered / Prescribed): 30 Gy / 30 Gy Prescribed Fxs (Delivered / Prescribed): 10 / 10   Plan Name: Pelvis_L Site: Ilium, Left Technique: 3D Mode: Photon Dose Per Fraction: 3 Gy Prescribed Dose (Delivered / Prescribed): 30 Gy / 30 Gy Prescribed Fxs (Delivered / Prescribed): 10 / 10  PAST MEDICAL HISTORY:  Past Medical History:  Diagnosis Date   Allergic rhinitis    Allergy 1953   Ankle fracture 1998   Asthma 1953   Atrial flutter (HCC)    a. s/p CTI ablation by Dr Kelsie 11/2014   Back pain 08/05/2015   BPH (benign prostatic hyperplasia) 08/26/2015   Cancer (HCC) 0630/2024   Prostate Cancer  Family history of breast cancer    Family history of prostate cancer    Ganglion cyst of dorsum of right wrist 04/08/2017   GERD (gastroesophageal reflux disease)    Hammer toe of right foot 05/06/2013   History of carpal tunnel syndrome     History of radiation therapy    Dr. Lynwood Nasuti 11/16/2023-11/27/2023   Hyperlipidemia    Hypertension    Insect bite 04/23/2015   OA (osteoarthritis) 02/06/2013   knees   Pedal edema 04/08/2017   Preventative health care 04/27/2011   Had normal colonoscopy in 2011     PAST SURGICAL HISTORY: Past Surgical History:  Procedure Laterality Date   A-FLUTTER ABLATION N/A 09/12/2021   Procedure: A-FLUTTER ABLATION;  Surgeon: Kelsie Lynwood, MD;  Location: [facility] INVASIVE CV LAB;  Service: Cardiovascular;  Laterality: N/A;   ABLATION  11/24/2014   CTI ablation by Dr Kelsie   ANKLE SURGERY     ATRIAL FIBRILLATION ABLATION N/A 09/02/2022   Procedure: ATRIAL FIBRILLATION ABLATION;  Surgeon: Nancey Eulas BRAVO, MD;  Location: [facility] INVASIVE CV LAB;  Service: Cardiovascular;  Laterality: N/A;   ATRIAL FLUTTER ABLATION N/A 11/24/2014   Procedure: ATRIAL FLUTTER ABLATION;  Surgeon: Lynwood Kelsie, MD;  Location[facility] CATH LAB;  Service: Cardiovascular;  Laterality: N/A;   CARDIOVERSION N/A 07/14/2022   Procedure: CARDIOVERSION;  Surgeon: Mona Vinie BROCKS, MD;  Location: [facility] ENDOSCOPY;  Service: Cardiovascular;  Laterality: N/A;   Carpel tunnel     COLONOSCOPY  08/04/2002   Normal Exam- Dr Abran   EYE SURGERY  1990   Radial Keratotomy   FRACTURE SURGERY  1998   Broken ankle   HERNIA REPAIR  2008   INGUINAL HERNIA REPAIR  10/13/2006   TONSILLECTOMY      FAMILY HISTORY:  Family History  Problem Relation Age of Onset   Arthritis Mother    Stroke Mother    Coronary artery disease Father        aortic valve disease   Heart attack Father 79   COPD Father    Hypertension Father    Hyperlipidemia Father    Dementia Father    Breast cancer Sister 5   Diabetes Sister    Prostate cancer Maternal Uncle    Diabetes type II Maternal Grandfather    Diabetes Maternal Grandfather    Hyperlipidemia Other    Hypertension Other     SOCIAL HISTORY:  Social History   Tobacco Use   Smoking status:  Former    Current packs/day: 0.00    Average packs/day: 1 pack/day for 10.0 years (10.0 ttl pk-yrs)    Types: Cigarettes    Start date: 72    Quit date: 1989    Years since quitting: 36.7   Smokeless tobacco: Never   Tobacco comments:    5-10 pack year history  Vaping Use   Vaping status: Never Used  Substance Use Topics   Alcohol use: Yes    Alcohol/week: 10.0 standard drinks of alcohol    Types: 10 Glasses of wine per week    Comment: 1-2 drinks daily (wine)   Drug use: No    ALLERGIES:  Allergies  Allergen Reactions   Penicillins Swelling    irriation    MEDICATIONS:  Current Outpatient Medications  Medication Sig Dispense Refill   oxyCODONE -acetaminophen  (PERCOCET/ROXICET) 5-325 MG tablet Take 1 tablet by mouth every 8 (eight) hours as needed for severe pain (pain score 7-10). 30 tablet 0   Accu-Chek Softclix Lancets lancets Use as  instructed 100 each 12   amLODipine  (NORVASC ) 10 MG tablet Take 10 mg by mouth daily.     Ascorbic Acid (VITAMIN C PO) Take 1 tablet by mouth daily.     Blood Glucose Monitoring Suppl DEVI 1 each by Does not apply route in the morning, at noon, and at bedtime. May substitute to any manufacturer covered by patient's insurance. 1 each 0   Brimonidine Tartrate (LUMIFY) 0.025 % SOLN Place 1 drop into both eyes every morning.     Calcium  Carbonate-Vit D-Min (CALCIUM  1200 PO) Take 1,200 mg by mouth daily.     Carboxymethylcellulose Sodium (LUBRICANT EYE DROPS OP) Place 1 drop into both eyes 2 (two) times daily as needed (Dry eye).     Clobetasol Propionate 0.05 % shampoo Apply 1 application topically daily as needed (irritation).     clotrimazole  (CLOTRIMAZOLE  ANTI-FUNGAL) 1 % cream Apply 1 Application topically 2 (two) times daily. 30 g 0   darolutamide  (NUBEQA ) 300 MG tablet Take 2 tablets (600 mg total) by mouth 2 (two) times daily with a meal. 120 tablet 3   ELIQUIS  5 MG TABS tablet TAKE 1 TABLET BY MOUTH TWICE  DAILY 200 tablet 2    fexofenadine  (ALLEGRA ) 180 MG tablet Take 1 tablet (180 mg total) by mouth daily.     fluticasone  (FLONASE ) 50 MCG/ACT nasal spray Place 2 sprays into both nostrils daily as needed for allergies or rhinitis.  2   glimepiride  (AMARYL ) 2 MG tablet TAKE 1 TABLET BY MOUTH DAILY WITH BREAKFAST 30 tablet 5   glucose blood test strip Use as instructed 100 each 12   ketoconazole (NIZORAL) 2 % shampoo Apply 1 application topically daily as needed for irritation.     lisinopril  (ZESTRIL ) 10 MG tablet Take 1 tablet (10 mg total) by mouth daily. 90 tablet 3   metoprolol  succinate (TOPROL -XL) 50 MG 24 hr tablet TAKE 1 TABLET BY MOUTH TWICE  DAILY WITH OR IMMEDIATELY  FOLLOWING A MEAL 200 tablet 2   Multiple Vitamin (MULTIVITAMIN) tablet Take 1 tablet by mouth every evening.     omeprazole  (PRILOSEC) 20 MG capsule TAKE 1 CAPSULE BY MOUTH DAILY 100 capsule 2   ondansetron  (ZOFRAN ) 4 MG tablet Take 1 tablet (4 mg total) by mouth every 8 (eight) hours as needed for nausea or vomiting. 40 tablet 3   rosuvastatin  (CRESTOR ) 5 MG tablet TAKE 1 TABLET (5 MG TOTAL) BY MOUTH DAILY. 90 tablet 3   sildenafil  (REVATIO ) 20 MG tablet TAKE 1 TO 4 TABLETS BY MOUTH DAILY AS NEEDED 90 tablet 0   silodosin  (RAPAFLO ) 8 MG CAPS capsule TAKE 1 CAPSULE BY MOUTH DAILY  WITH BREAKFAST 100 capsule 2   sulfamethoxazole -trimethoprim  (BACTRIM  DS) 800-160 MG tablet Take 1 tablet by mouth 2 (two) times daily. (Patient not taking: Reported on 06/30/2024) 14 tablet 4   triamcinolone  cream (KENALOG ) 0.1 % Apply 1 Application topically 3 (three) times daily.     VITAMIN D PO Take 1 capsule by mouth daily. 1000 units daily.     Current Facility-Administered Medications  Medication Dose Route Frequency Provider Last Rate Last Admin   leuprolide  (6 Month) (LUPRON ) injection 45 mg  45 mg Intramuscular Once Stoneking, Adine PARAS., MD        REVIEW OF SYSTEMS:  Patient notes right sided lower back pain that radiates down the back/side of his leg to  his knee. This pain was initially improved after he finished radiation, but has returned approximately 3 months later. He states  the pain is 9/10 in the morning and 5/10 after he has started moving for the day. He takes Tylenol  1-2x/day. He endorses some leg weakness, but denies any numbness or tingling. He notes increase in urinary frequency and urgency. He sometimes wears a pad for this. He denies pain elsewhere.    PHYSICAL EXAM:  height is 6' (1.829 m) and weight is 218 lb 9.6 oz (99.2 kg). His temperature is 97.1 F (36.2 C) (abnormal). His blood pressure is 132/71 and his pulse is 89. His respiration is 18 and oxygen saturation is 98%.   General: Alert and oriented, in no acute distress MSK: 5/5 lower extremity strength. Sensation grossly intact. Normal gait. No tenderness to palpation along the spine.    ECOG = 2  0 - Asymptomatic (Fully active, able to carry on all predisease activities without restriction)  1 - Symptomatic but completely ambulatory (Restricted in physically strenuous activity but ambulatory and able to carry out work of a light or sedentary nature. For example, light housework, office work)  2 - Symptomatic, <50% in bed during the day (Ambulatory and capable of all self care but unable to carry out any work activities. Up and about more than 50% of waking hours)  3 - Symptomatic, >50% in bed, but not bedbound (Capable of only limited self-care, confined to bed or chair 50% or more of waking hours)  4 - Bedbound (Completely disabled. Cannot carry on any self-care. Totally confined to bed or chair)  5 - Death   Raylene MM, Creech RH, Tormey DC, et al. [phone]). Toxicity and response criteria of the [facility]. Am. DOROTHA Bridges. Oncol. 5 (6): 649-55  LABORATORY DATA:  Lab Results  Component Value Date   WBC 4.3 06/30/2024   HGB 12.9 (L) 06/30/2024   HCT 37.6 (L) 06/30/2024   MCV 99.7 06/30/2024   PLT 125 (L) 06/30/2024   NEUTROABS 2.2 06/30/2024    Lab Results  Component Value Date   NA 141 06/30/2024   K 4.0 06/30/2024   CL 105 06/30/2024   CO2 23 06/30/2024   GLUCOSE 135 (H) 06/30/2024   BUN 14 06/30/2024   CREATININE 0.74 06/30/2024   CALCIUM  8.8 (L) 06/30/2024      RADIOGRAPHY: NM PET (PSMA) SKULL TO MID THIGH Result Date: 06/19/2024 EXAM: PET AND CT SKULL BASE TO MID THIGH 06/16/2024 12:08:46 PM TECHNIQUE: None RADIOPHARMACEUTICAL: 8.47 mCi F18 POSLUMA  IV RAC @ 1055 / HJ Uptake time 60 minutes. Glucose level <0.02 mg/dl. PET imaging was acquired from the base of the skull to the mid thighs. Non-contrast enhanced computed tomography was obtained for attenuation correction and anatomic localization. COMPARISON: 02/25/2024 CLINICAL HISTORY: Prostate cancer, metastatic, assess treatment response; Assess for response to treatment. FINDINGS: HEAD AND NECK: No trace or avid nodes within the neck. Left carotid atherosclerosis. CHEST: No tracer avid thoracic nodes or pulmonary nodules. Mild cardiomegaly. Aortic valve calcification. Aortic and coronary artery calcification. New mild right gynecomastia with tracer affinity at SUV 2.3. Lingular nodule of 3 mm is likely a calcified granuloma. ABDOMEN AND PELVIS: A preaortic node measures 5 mm and SUV 2.5 on image 139/4 compared to 4 mm and SUV 2.1 on the prior exam. No focal or dominant prosthetic tracer affinity. Mild hepatic steatosis. Abdominal aortic atherosclerosis. Normal adrenal glands. Small fat-containing right inguinal hernia. BONES AND SOFT TISSUE: Multifocal tracer avid osseous metastasis. Example within the right iliac wing, relatively CT occult at SUV 5.7 today versus SUV 7.0 on the prior.  Within the right side of the L1 vertebral body at SUV 7.2 today versus SUV 7.5 previously. Within the left side of the T9 vertebral body at SUV 8.2 today versus SUV 4.2 on the prior. Within the posterior left acetabulum at SUV 5.2 today versus SUV 3.4 on the prior. IMPRESSION: 1. Multifocal osseous  metastasis, felt to be minimally progressive. Although some lesions are similar and slightly decreased in tracer affinity, others are definitely progressive. 2. Isolated preaortic node which has minimally enlarged and demonstrates increased, nonspecific tracer uptake. Cannot exclude isolated nodal metastasis. 3. aortic valvular calcifications. Consider echocardiography to evaluate for valvular dysfunction. 4. Incidental findings, including: Aortic atherosclerosis ([id number]). Coronary artery atherosclerosis. New right-sided gynecomastia. Mild hepatic steatosis. Electronically signed by: Rockey Kilts MD 06/19/2024 02:28 PM EDT RP Workstation: [id number]      IMPRESSION: Bony metastases from stage IVB (cT2b, cN0, pM1) prostate cancer; s/p palliative radiation therapy completed on 11/27/2023, now presenting with recurrent right sided back pain.   Patient presents to us  today with recurrent right sided back pain. This pain was initially improved with his previous radiation, but unfortunately returned and has worsened, interfering with his quality of life. Dr. Timmy does not think he is a good candidate for chemotherapy. Dr. Shannon recommends Xofigo therapy in light of the multifocal osseous metastatic picture to bone with mixed response to androgen ablation  Today, I talked to the patient and family about the findings and work-up thus far.  We discussed the natural history of metastatic prostate cancer and general treatment, highlighting the role of radiotherapy (Xofigo) in the management.  We discussed the available radiation techniques, and focused on the details of logistics and delivery.  We reviewed the anticipated acute and late sequelae associated with radiation in this setting.  The patient was encouraged to ask questions that I answered to the best of my ability. A patient consent form was discussed and signed.  We retained a copy for our records.    PLAN: Patient will be scheduled for Xofigo  ([id number]) injections in the near future. Dr. Shannon anticipates 6 injections total.   Prescription of Percocet sent to his pharmacy for pain issues    40 minutes of total time was spent for this patient encounter, including preparation, face-to-face counseling with the patient and coordination of care, physical exam, and documentation of the encounter.   ------------------------------------------------   Leeroy Due, PA-C   Lynwood CHARM Shannon, PhD, MD   [facility]  Radiation Oncology Direct Dial: [phone]  Fax: [phone] [email or web address]    This document serves as a record of services personally performed by Lynwood Shannon, MD. It was created on his behalf by Reymundo Cartwright, a trained medical scribe. The creation of this record is based on the scribe's personal observations and the provider's statements to them. This document has been checked and approved by the attending provider.

## 2024-07-07 ENCOUNTER — Ambulatory Visit
Admission: RE | Admit: 2024-07-07 | Discharge: 2024-07-07 | Discharge status: Home / Self Care | Source: Ambulatory Visit | Attending: Radiation Oncology | Admitting: Radiation Oncology

## 2024-07-07 ENCOUNTER — Encounter: Payer: Self-pay | Admitting: Radiation Oncology

## 2024-07-07 ENCOUNTER — Ambulatory Visit
Admission: RE | Admit: 2024-07-07 | Discharge: 2024-07-07 | Disposition: A | Discharge status: Home / Self Care | Source: Ambulatory Visit | Attending: Radiation Oncology | Admitting: Radiation Oncology

## 2024-07-07 VITALS — BP 132/71 | HR 89 | Temp 97.1°F | Resp 18 | Ht 72.0 in | Wt 218.6 lb

## 2024-07-07 DIAGNOSIS — C61 Malignant neoplasm of prostate: Secondary | ICD-10-CM

## 2024-07-07 DIAGNOSIS — C7951 Secondary malignant neoplasm of bone: Secondary | ICD-10-CM

## 2024-07-07 MED ORDER — OXYCODONE-ACETAMINOPHEN 5-325 MG PO TABS: 1.0000 | ORAL_TABLET | Freq: Three times a day (TID) | ORAL | 0 refills | Status: DC | PRN

## 2024-07-10 NOTE — Assessment & Plan Note (Addendum)
Avoid offending foods, start probiotics. Do not eat large meals in late evening and consider raising head of bed.  

## 2024-07-10 NOTE — Assessment & Plan Note (Signed)
Rate controlled tolerating meds 

## 2024-07-10 NOTE — Assessment & Plan Note (Signed)
 Well controlled, no changes to meds. Encouraged heart healthy diet such as the DASH diet and exercise as tolerated.

## 2024-07-11 ENCOUNTER — Encounter: Payer: Self-pay | Admitting: Family Medicine

## 2024-07-11 ENCOUNTER — Ambulatory Visit (INDEPENDENT_AMBULATORY_CARE_PROVIDER_SITE_OTHER): Admitting: Family Medicine

## 2024-07-11 VITALS — BP 128/78 | HR 70 | Temp 97.6°F | Resp 16 | Ht 72.0 in | Wt 223.0 lb

## 2024-07-11 DIAGNOSIS — I10 Essential (primary) hypertension: Secondary | ICD-10-CM | POA: Diagnosis not present

## 2024-07-11 DIAGNOSIS — E119 Type 2 diabetes mellitus without complications: Secondary | ICD-10-CM

## 2024-07-11 DIAGNOSIS — C61 Malignant neoplasm of prostate: Secondary | ICD-10-CM

## 2024-07-11 DIAGNOSIS — E782 Mixed hyperlipidemia: Secondary | ICD-10-CM

## 2024-07-11 DIAGNOSIS — K219 Gastro-esophageal reflux disease without esophagitis: Secondary | ICD-10-CM

## 2024-07-11 DIAGNOSIS — I4819 Other persistent atrial fibrillation: Secondary | ICD-10-CM

## 2024-07-11 DIAGNOSIS — Z23 Encounter for immunization: Secondary | ICD-10-CM | POA: Diagnosis not present

## 2024-07-11 LAB — LIPID PANEL
Cholesterol: 156 mg/dL (ref 0–200)
HDL: 64.8 mg/dL (ref >39.00)
LDL Cholesterol: 60 mg/dL (ref 0–99)
NonHDL: 91.18
Total CHOL/HDL Ratio: 2
Triglycerides: 156 mg/dL — ABNORMAL HIGH (ref 0.0–149.0)
VLDL: 31.2 mg/dL (ref 0.0–40.0)

## 2024-07-11 LAB — COMPREHENSIVE METABOLIC PANEL WITH GFR
ALT: 20 U/L (ref 0–53)
AST: 23 U/L (ref 0–37)
Albumin: 4.2 g/dL (ref 3.5–5.2)
Alkaline Phosphatase: 52 U/L (ref 39–117)
BUN: 14 mg/dL (ref 6–23)
CO2: 29 meq/L (ref 19–32)
Calcium: 9.2 mg/dL (ref 8.4–10.5)
Chloride: 102 meq/L (ref 96–112)
Creatinine, Ser: 0.7 mg/dL (ref 0.40–1.50)
GFR: 92.22 mL/min (ref >60.00)
Glucose, Bld: 157 mg/dL — ABNORMAL HIGH (ref 70–99)
Potassium: 4.1 meq/L (ref 3.5–5.1)
Sodium: 139 meq/L (ref 135–145)
Total Bilirubin: 0.6 mg/dL (ref 0.2–1.2)
Total Protein: 6.9 g/dL (ref 6.0–8.3)

## 2024-07-11 LAB — HEMOGLOBIN A1C: Hgb A1c MFr Bld: 6.1 % (ref 4.6–6.5)

## 2024-07-11 LAB — TSH: TSH: 0.76 u[IU]/mL (ref 0.35–5.50)

## 2024-07-11 NOTE — Progress Notes (Signed)
 Subjective:    Patient ID: William Phillips, male    DOB: 27-Feb-1951, 73 y.o.   MRN: [id number]  No chief complaint on file.   HPI Discussed the use of AI scribe software for clinical note transcription with the patient, who gave verbal consent to proceed.  History of Present Illness Usman Millett is a 73 year old male with prostate cancer and bone metastases who presents for follow-up on his cancer treatment and management.  He has developed a new soft, mobile mass on his leg that appeared approximately two weeks ago. There is no history of trauma to the area, and it is not tender.  His prostate cancer was diagnosed approximately six to eight months after his retirement in November 2023. He is currently on Nubeqa , taking two pills twice a day, and is under the care of both urology and oncology specialists. He underwent ten treatments of radiation in February, which did not alleviate his pain. Recent imaging, including a PET scan, showed a lesion on the right side extending into the femur. The patient reports that his oncologist told him the lesion had not gone away and that some activity had gotten worse. He is scheduled to start monthly injections of Xofigo.  He experiences hip pain, for which he received steroid injections following an MRI a few months ago. The pain has extended to his lower back and right femur.  He maintains an active lifestyle, walking three to five times a week, and follows a Mediterranean diet. He consumes one to two glasses of red wine at dinner and is mindful of hydration and protein intake.  His PSA levels are barely detectable, and his testosterone  is low, contributing to reduced energy levels. He is emotionally affected by his cancer diagnosis, describing it as an 'evil word'.  He has a family history of prostate cancer, with an uncle who had the condition in the late 1970s or early 13s.    Past Medical History:  Diagnosis Date   Allergic  rhinitis    Allergy 1953   Ankle fracture 1998   Asthma 1953   Atrial flutter (HCC)    a. s/p CTI ablation by Dr Kelsie 11/2014   Back pain 08/05/2015   BPH (benign prostatic hyperplasia) 08/26/2015   Cancer (HCC) 0630/2024   Prostate Cancer   Family history of breast cancer    Family history of prostate cancer    Ganglion cyst of dorsum of right wrist 04/08/2017   GERD (gastroesophageal reflux disease)    Hammer toe of right foot 05/06/2013   History of carpal tunnel syndrome    History of radiation therapy    Dr. Lynwood Nasuti 11/16/2023-11/27/2023   Hyperlipidemia    Hypertension    Insect bite 04/23/2015   OA (osteoarthritis) 02/06/2013   knees   Pedal edema 04/08/2017   Preventative health care 04/27/2011   Had normal colonoscopy in 2011    Skin cancer 2020    Past Surgical History:  Procedure Laterality Date   A-FLUTTER ABLATION N/A 09/12/2021   Procedure: A-FLUTTER ABLATION;  Surgeon: Kelsie Lynwood, MD;  Location: [facility];  Service: Cardiovascular;  Laterality: N/A;   ABLATION  11/24/2014   CTI ablation by Dr Kelsie   ANKLE SURGERY     ATRIAL FIBRILLATION ABLATION N/A 09/02/2022   Procedure: ATRIAL FIBRILLATION ABLATION;  Surgeon: Nancey Eulas BRAVO, MD;  Location: [facility];  Service: Cardiovascular;  Laterality: N/A;   ATRIAL FLUTTER ABLATION N/A 11/24/2014  Procedure: ATRIAL FLUTTER ABLATION;  Surgeon: Lynwood Rakers, MD;  Location: [facility] CATH LAB;  Service: Cardiovascular;  Laterality: N/A;   CARDIOVERSION N/A 07/14/2022   Procedure: CARDIOVERSION;  Surgeon: Mona Vinie BROCKS, MD;  Location: [facility] ENDOSCOPY;  Service: Cardiovascular;  Laterality: N/A;   Carpel tunnel     COLONOSCOPY  08/04/2002   Normal Exam- Dr Abran   EYE SURGERY  1990   Radial Keratotomy   FRACTURE SURGERY  1998   Broken ankle   HERNIA REPAIR  2008   INGUINAL HERNIA REPAIR  10/13/2006   TONSILLECTOMY      Family History  Problem Relation Age of Onset   Arthritis Mother     Stroke Mother    Coronary artery disease Father        aortic valve disease   Heart attack Father 51   COPD Father    Hypertension Father    Hyperlipidemia Father    Dementia Father    Breast cancer Sister 67   Diabetes Sister    Cancer Sister 32   Diabetes Brother    Prostate cancer Maternal Uncle    Cancer Maternal Uncle    Diabetes type II Maternal Grandfather    Diabetes Maternal Grandfather    Hyperlipidemia Other    Hypertension Other     Social History   Socioeconomic History   Marital status: Married    Spouse name: Not on file   Number of children: 1   Years of education: Not on file   Highest education level: Master's degree (e.g., MA, MS, MEng, MEd, MSW, MBA)  Occupational History    Comment: Sales  Tobacco Use   Smoking status: Former    Current packs/day: 0.00    Average packs/day: 1 pack/day for 10.0 years (10.0 ttl pk-yrs)    Types: Cigarettes    Start date: 53    Quit date: 1989    Years since quitting: 36.7   Smokeless tobacco: Never   Tobacco comments:    5-10 pack year history  Vaping Use   Vaping status: Never Used  Substance and Sexual Activity   Alcohol use: Yes    Alcohol/week: 10.0 standard drinks of alcohol    Types: 10 Glasses of wine per week    Comment: 1-2 drinks daily (wine)   Drug use: No   Sexual activity: Yes  Other Topics Concern   Not on file  Social History Narrative   Occupation: Airline pilot  Architect- chemicals, seed, erosion control)   Married- 35 years   21 daughter -Holiday representative in   ([place]   Previous smoker - 5-10 pack yrs     Alcohol use-yes (2-3 glasses of wine)     Social Drivers of Corporate investment banker Strain: Low Risk  (04/05/2024)   Overall Financial Resource Strain (CARDIA)    Difficulty of Paying Living Expenses: Not hard at all  Food Insecurity: No Food Insecurity (07/07/2024)   Hunger Vital Sign    Worried About Running Out of Food in the Last Year: Never true    Ran Out of Food  in the Last Year: Never true  Transportation Needs: No Transportation Needs (07/07/2024)   PRAPARE - Administrator, Civil Service (Medical): No    Lack of Transportation (Non-Medical): No  Physical Activity: Sufficiently Active (04/05/2024)   Exercise Vital Sign    Days of Exercise per Week: 4 days    Minutes of Exercise per Session: 40 min  Stress: No Stress  Concern Present (04/05/2024)   [employer] of Occupational Health - Occupational Stress Questionnaire    Feeling of Stress: Not at all  Social Connections: Socially Integrated (04/05/2024)   Social Connection and Isolation Panel    Frequency of Communication with Friends and Family: More than three times a week    Frequency of Social Gatherings with Friends and Family: Once a week    Attends Religious Services: More than 4 times per year    Active Member of [employer] or Organizations: Yes    Attends Engineer, structural: More than 4 times per year    Marital Status: Married  Catering manager Violence: Not At Risk (07/07/2024)   Humiliation, Afraid, Rape, and Kick questionnaire    Fear of Current or Ex-Partner: No    Emotionally Abused: No    Physically Abused: No    Sexually Abused: No    Outpatient Medications Prior to Visit  Medication Sig Dispense Refill   Accu-Chek Softclix Lancets lancets Use as instructed 100 each 12   amLODipine  (NORVASC ) 10 MG tablet Take 10 mg by mouth daily.     Ascorbic Acid (VITAMIN C PO) Take 1 tablet by mouth daily.     Blood Glucose Monitoring Suppl DEVI 1 each by Does not apply route in the morning, at noon, and at bedtime. May substitute to any manufacturer covered by patient's insurance. 1 each 0   Brimonidine Tartrate (LUMIFY) 0.025 % SOLN Place 1 drop into both eyes every morning.     Calcium  Carbonate-Vit D-Min (CALCIUM  1200 PO) Take 1,200 mg by mouth daily.     Carboxymethylcellulose Sodium (LUBRICANT EYE DROPS OP) Place 1 drop into both eyes 2 (two) times daily as  needed (Dry eye).     Clobetasol Propionate 0.05 % shampoo Apply 1 application topically daily as needed (irritation).     clotrimazole  (CLOTRIMAZOLE  ANTI-FUNGAL) 1 % cream Apply 1 Application topically 2 (two) times daily. 30 g 0   darolutamide  (NUBEQA ) 300 MG tablet Take 2 tablets (600 mg total) by mouth 2 (two) times daily with a meal. 120 tablet 3   ELIQUIS  5 MG TABS tablet TAKE 1 TABLET BY MOUTH TWICE  DAILY 200 tablet 2   fexofenadine  (ALLEGRA ) 180 MG tablet Take 1 tablet (180 mg total) by mouth daily.     fluticasone  (FLONASE ) 50 MCG/ACT nasal spray Place 2 sprays into both nostrils daily as needed for allergies or rhinitis.  2   glimepiride  (AMARYL ) 2 MG tablet TAKE 1 TABLET BY MOUTH DAILY WITH BREAKFAST 30 tablet 5   glucose blood test strip Use as instructed 100 each 12   ketoconazole (NIZORAL) 2 % shampoo Apply 1 application topically daily as needed for irritation.     lisinopril  (ZESTRIL ) 10 MG tablet Take 1 tablet (10 mg total) by mouth daily. 90 tablet 3   metoprolol  succinate (TOPROL -XL) 50 MG 24 hr tablet TAKE 1 TABLET BY MOUTH TWICE  DAILY WITH OR IMMEDIATELY  FOLLOWING A MEAL 200 tablet 2   Multiple Vitamin (MULTIVITAMIN) tablet Take 1 tablet by mouth every evening.     omeprazole  (PRILOSEC) 20 MG capsule TAKE 1 CAPSULE BY MOUTH DAILY 100 capsule 2   ondansetron  (ZOFRAN ) 4 MG tablet Take 1 tablet (4 mg total) by mouth every 8 (eight) hours as needed for nausea or vomiting. 40 tablet 3   oxyCODONE -acetaminophen  (PERCOCET/ROXICET) 5-325 MG tablet Take 1 tablet by mouth every 8 (eight) hours as needed for severe pain (pain score 7-10). 30 tablet 0  rosuvastatin  (CRESTOR ) 5 MG tablet TAKE 1 TABLET (5 MG TOTAL) BY MOUTH DAILY. 90 tablet 3   sildenafil  (REVATIO ) 20 MG tablet TAKE 1 TO 4 TABLETS BY MOUTH DAILY AS NEEDED 90 tablet 0   silodosin  (RAPAFLO ) 8 MG CAPS capsule TAKE 1 CAPSULE BY MOUTH DAILY  WITH BREAKFAST 100 capsule 2   triamcinolone  cream (KENALOG ) 0.1 % Apply 1  Application topically 3 (three) times daily.     VITAMIN D PO Take 1 capsule by mouth daily. 1000 units daily.     sulfamethoxazole -trimethoprim  (BACTRIM  DS) 800-160 MG tablet Take 1 tablet by mouth 2 (two) times daily. (Patient not taking: Reported on 06/30/2024) 14 tablet 4   Facility-Administered Medications Prior to Visit  Medication Dose Route Frequency Provider Last Rate Last Admin   leuprolide  (6 Month) (LUPRON ) injection 45 mg  45 mg Intramuscular Once Stoneking, Adine PARAS., MD        Allergies  Allergen Reactions   Penicillins Swelling    irriation    Review of Systems  Constitutional:  Positive for malaise/fatigue. Negative for fever.  HENT:  Negative for congestion.   Eyes:  Negative for blurred vision.  Respiratory:  Negative for shortness of breath.   Cardiovascular:  Negative for chest pain, palpitations and leg swelling.  Gastrointestinal:  Negative for abdominal pain, blood in stool and nausea.  Genitourinary:  Negative for dysuria and frequency.  Musculoskeletal:  Positive for back pain and joint pain. Negative for falls.  Skin:  Negative for rash.  Neurological:  Negative for dizziness, loss of consciousness and headaches.  Endo/Heme/Allergies:  Negative for environmental allergies.  Psychiatric/Behavioral:  Negative for depression. The patient is not nervous/anxious.        Objective:    Physical Exam Vitals reviewed.  Constitutional:      Appearance: Normal appearance. He is not ill-appearing.  HENT:     Head: Normocephalic and atraumatic.     Nose: Nose normal.  Eyes:     Conjunctiva/sclera: Conjunctivae normal.  Cardiovascular:     Rate and Rhythm: Normal rate.     Pulses: Normal pulses.     Heart sounds: Normal heart sounds. No murmur heard. Pulmonary:     Effort: Pulmonary effort is normal.     Breath sounds: Normal breath sounds. No wheezing.  Abdominal:     Palpations: Abdomen is soft. There is no mass.     Tenderness: There is no abdominal  tenderness.  Musculoskeletal:     Cervical back: Normal range of motion.     Right lower leg: No edema.     Left lower leg: No edema.  Skin:    General: Skin is warm and dry.     Comments: Soft, nontender oblong cystic structure over left knee. Mobile   Neurological:     General: No focal deficit present.     Mental Status: He is alert and oriented to person, place, and time.  Psychiatric:        Mood and Affect: Mood normal.     BP 128/78   Pulse 70   Temp 97.6 F (36.4 C)   Resp 16   Ht 6' (1.829 m)   Wt 223 lb (101.2 kg)   SpO2 98%   BMI 30.24 kg/m  Wt Readings from Last 3 Encounters:  07/11/24 223 lb (101.2 kg)  07/07/24 218 lb 9.6 oz (99.2 kg)  06/30/24 220 lb (99.8 kg)    Diabetic Foot Exam - Simple   No data filed  Lab Results  Component Value Date   WBC 4.3 06/30/2024   HGB 12.9 (L) 06/30/2024   HCT 37.6 (L) 06/30/2024   PLT 125 (L) 06/30/2024   GLUCOSE 135 (H) 06/30/2024   CHOL 148 12/29/2023   TRIG 205.0 (H) 12/29/2023   HDL 64.50 12/29/2023   LDLCALC 42 12/29/2023   ALT 34 06/30/2024   AST 42 (H) 06/30/2024   NA 141 06/30/2024   K 4.0 06/30/2024   CL 105 06/30/2024   CREATININE 0.74 06/30/2024   BUN 14 06/30/2024   CO2 23 06/30/2024   TSH 1.18 12/29/2023   PSA 4.18 (H) 12/16/2022   HGBA1C 5.7 04/06/2024   MICROALBUR 2.3 (H) 12/29/2023    Lab Results  Component Value Date   TSH 1.18 12/29/2023   Lab Results  Component Value Date   WBC 4.3 06/30/2024   HGB 12.9 (L) 06/30/2024   HCT 37.6 (L) 06/30/2024   MCV 99.7 06/30/2024   PLT 125 (L) 06/30/2024   Lab Results  Component Value Date   NA 141 06/30/2024   K 4.0 06/30/2024   CO2 23 06/30/2024   GLUCOSE 135 (H) 06/30/2024   BUN 14 06/30/2024   CREATININE 0.74 06/30/2024   BILITOT 0.5 06/30/2024   ALKPHOS 51 06/30/2024   AST 42 (H) 06/30/2024   ALT 34 06/30/2024   PROT 6.6 06/30/2024   ALBUMIN 4.1 06/30/2024   CALCIUM  8.8 (L) 06/30/2024   ANIONGAP 13 06/30/2024   EGFR 94  08/05/2022   GFR 87.62 06/18/2023   Lab Results  Component Value Date   CHOL 148 12/29/2023   Lab Results  Component Value Date   HDL 64.50 12/29/2023   Lab Results  Component Value Date   LDLCALC 42 12/29/2023   Lab Results  Component Value Date   TRIG 205.0 (H) 12/29/2023   Lab Results  Component Value Date   CHOLHDL 2 12/29/2023   Lab Results  Component Value Date   HGBA1C 5.7 04/06/2024       Assessment & Plan:  Persistent atrial fibrillation (HCC) Assessment & Plan: Rate controlled tolerating meds   Essential hypertension Assessment & Plan: Well controlled, no changes to meds. Encouraged heart healthy diet such as the DASH diet and exercise as tolerated.    Orders: -     Comprehensive metabolic panel with GFR -     TSH  Gastroesophageal reflux disease without esophagitis Assessment & Plan: Avoid offending foods, start probiotics. Do not eat large meals in late evening and consider raising head of bed.     Need for influenza vaccination -     Flu vaccine HIGH DOSE PF(Fluzone Trivalent) -     Hemoglobin A1c  Prostate cancer (HCC); PSA 5.3; GG 1,2; unfavorable intermediate risk Assessment & Plan: He has struggled with recurrent cancer is now taking Nubeqa  and they are going to add Xofigo shots monthly because he did not respond to radiation treatments earlier in the year.    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 Previous radiation therapy was ineffective. Current treatment includes Nubeqa  and planned initiation of Xofigo injections. PSA levels are barely detectable, and  testosterone  levels are low.  Emotional impact of diagnosis acknowledged. - Continue Nubeqa  as prescribed. - Initiate Xofigo injections monthly for six months. - Monitor emotional well-being and offer support as needed.  Hypocalcemia Hypocalcemia with previous low calcium  levels. Vitamin D deficiency suspected as a contributing factor. Low calcium  can cause heart arrhythmias if it drops further. - Check calcium  and vitamin D levels. - Advise taking Tums daily as per oncologist's recommendation. left knee cystic mass Left knee cystic mass, likely a simple cystic structure. Mobile and non-tender. Orthopedic evaluation scheduled. - Advise icing the mass daily for a couple of weeks. - Consult with orthopedic specialist for potential drainage.  General Health Maintenance General health maintenance discussed, including lifestyle modifications and vaccinations. Emphasis on Mediterranean diet, regular physical activity, and hydration. Alcohol intake should be limited to one glass per day. Vaccinations recommended to prevent respiratory illnesses, especially given cancer diagnosis. - Administer flu shot today. - Recommend RSV vaccine. - Discuss COVID vaccine and its benefits. - Encourage Mediterranean diet and regular physical activity. - Advise limiting alcohol intake to one glass per day.  Recording duration: 21 minutes     Harlene Horton, MD

## 2024-07-11 NOTE — Assessment & Plan Note (Signed)
 He has struggled with recurrent cancer is now taking Nubeqa  and they are going to add Xofigo shots monthly because he did not respond to radiation treatments earlier in the year.

## 2024-07-11 NOTE — Patient Instructions (Addendum)
 RSV, Respiratory Syncitial Virus Vaccine, Arexvy  Annual COVID booster  [facility] are now walk in vaccine clinics M-F 9-4   Hypocalcemia, Adult Hypocalcemia is when the level of calcium  in a person's blood is below normal. Calcium  is a mineral that is used by the body in many ways. Not having enough blood calcium  can affect the nervous system. This can lead to problems with the muscles, the heart, and the brain. What are the causes? This condition may be caused by: A deficiency of vitamin D or magnesium or both. Decreased levels of parathyroid hormone (hypoparathyroidism). Kidney function problems. Low levels of a body protein called albumin. Inflammation of the pancreas (pancreatitis). Not taking in enough vitamins and minerals in the diet or having intestinal problems that interfere with absorption of nutrients. Certain medicines. What are the signs or symptoms? Symptoms of this condition may include: Numbness and tingling in the fingers, toes, or around the mouth. Muscle twitching, aches, or cramps, especially in the legs, feet, and back. Spasm of the voice box. This may make it difficult to breathe or speak. Fast heartbeats (palpitations) and abnormal heart rhythms (dysrhythmias). Shaking uncontrollably (seizures). Memory problems, confusion, or difficulty thinking. Depression, anxiety, irritability, or changes in personality. Long-term (chronic) symptoms of this condition may include: Coarse, brittle hair and nails. Dry skin or lasting skin diseases. These include psoriasis, eczema, or dermatitis. Dental cavities. Clouding of the eye lens (cataracts). Some people may not have any symptoms, especially if they have chronic hypocalcemia. How is this diagnosed?  This condition is usually diagnosed with a blood test. You may also have other tests to help determine the underlying cause of the condition. This may include more blood tests and imaging tests. How is this  treated? This condition may be treated with: Calcium  given by mouth or through an IV. The method used for giving calcium  will depend on the severity of the condition. If your condition is severe, you may need to be closely monitored in the hospital. Giving other minerals (electrolytes), such as magnesium. Other treatment will depend on the cause of the condition. Follow these instructions at home: Follow diet instructions from your health care provider or dietitian. Take supplements only as told by your health care provider. Do not take an iron supplement within 2 hours of taking a calcium  supplement. Keep all follow-up visits. This is important. Contact a health care provider if: You have increased muscle twitching or cramps. You have new swelling in the feet, ankles, or legs. You develop changes in mood, memory, or personality. Get help right away if: You have chest pain. You have persistent rapid or irregular heartbeats. You have trouble breathing. You faint. You start to have seizures. You have confusion. These symptoms may represent a serious problem that is an emergency. Do not wait to see if the symptoms will go away. Get medical help right away. Call your local emergency services (911 in the [place] Do not drive yourself to the hospital. Summary Hypocalcemia is when the level of calcium  in a person's blood is below normal. Not having enough blood calcium  can affect the nervous system. This condition may be treated with calcium  given by mouth or through an IV, or by receiving other minerals. Other treatment depends on the cause of the condition. Take supplements only as told by your health care provider. Contact a health care provider if you have new or worsening symptoms. Keep all follow-up visits. This is important. This information is not intended to replace advice  given to you by your health care provider. Make sure you discuss any questions you have with your health care  provider. Document Revised: 03/06/2021 Document Reviewed: 03/06/2021 Elsevier Patient Education  2024 [employer].

## 2024-07-12 ENCOUNTER — Encounter: Payer: Self-pay | Admitting: Family Medicine

## 2024-07-12 ENCOUNTER — Ambulatory Visit: Payer: Self-pay | Admitting: Family Medicine

## 2024-07-13 NOTE — Progress Notes (Signed)
 Patient reviewed via MyChart.

## 2024-07-14 ENCOUNTER — Telehealth: Payer: Self-pay | Admitting: *Deleted

## 2024-07-14 NOTE — Telephone Encounter (Signed)
CALLED PATIENT TO UPDATE, SPOKE WITH PATIENT 

## 2024-07-15 ENCOUNTER — Other Ambulatory Visit: Payer: Self-pay | Admitting: Radiology

## 2024-07-15 DIAGNOSIS — C7951 Secondary malignant neoplasm of bone: Secondary | ICD-10-CM

## 2024-07-15 DIAGNOSIS — C61 Malignant neoplasm of prostate: Secondary | ICD-10-CM

## 2024-07-15 LAB — VITAMIN D 1,25 DIHYDROXY
Vitamin D 1, 25 (OH)2 Total: 34 pg/mL (ref 18–72)
Vitamin D2 1, 25 (OH)2: <8 pg/mL
Vitamin D3 1, 25 (OH)2: 34 pg/mL

## 2024-07-18 ENCOUNTER — Encounter: Payer: Self-pay | Admitting: Hematology & Oncology

## 2024-07-21 ENCOUNTER — Telehealth: Payer: Self-pay | Admitting: *Deleted

## 2024-07-21 NOTE — Telephone Encounter (Signed)
 CALLED PATIENT TO INFORM OF LAB AND WEIGHT APPT. FOR 08-04-24 - @ 12 PM @ [facility] AND HIS INJECTION ON 08-11-24- ARRIVAL TIME- 11:45 AM @ [facility], SPOKE WITH PATIENT AND HE IS AWARE OF THESE APPTS. AND THE INSTRUCTIONS

## 2024-07-27 ENCOUNTER — Telehealth: Payer: Self-pay | Admitting: Cardiology

## 2024-07-27 ENCOUNTER — Encounter: Payer: Self-pay | Admitting: Physician Assistant

## 2024-07-27 ENCOUNTER — Ambulatory Visit: Attending: Physician Assistant | Admitting: Physician Assistant

## 2024-07-27 VITALS — BP 96/67 | HR 146 | Ht 72.0 in | Wt 223.0 lb

## 2024-07-27 DIAGNOSIS — I2510 Atherosclerotic heart disease of native coronary artery without angina pectoris: Secondary | ICD-10-CM

## 2024-07-27 DIAGNOSIS — D6869 Other thrombophilia: Secondary | ICD-10-CM | POA: Diagnosis not present

## 2024-07-27 DIAGNOSIS — I10 Essential (primary) hypertension: Secondary | ICD-10-CM | POA: Diagnosis not present

## 2024-07-27 DIAGNOSIS — I4819 Other persistent atrial fibrillation: Secondary | ICD-10-CM | POA: Diagnosis not present

## 2024-07-27 DIAGNOSIS — I484 Atypical atrial flutter: Secondary | ICD-10-CM | POA: Diagnosis not present

## 2024-07-27 MED ORDER — METOPROLOL SUCCINATE ER 100 MG PO TB24: 100.0000 mg | ORAL_TABLET | Freq: Two times a day (BID) | ORAL | 3 refills | Status: AC

## 2024-07-27 NOTE — Progress Notes (Signed)
 Cardiology Office Note:  .   Date:  07/27/2024  ID:  William Phillips, DOB 06-Jun-1951, MRN [id number] PCP: Domenica Harlene LABOR, MD  [facility] Providers Cardiologist:  Redell Shallow, MD Electrophysiologist:  Eulas FORBES Furbish, MD {  History of Present Illness: William Phillips is a 73 y.o. male w/PMHx of  HTN, HLD, GERD, DM AFlutter > subsequent AFib  He saw Dr. Shallow 08/12/23, doing well, maintaining SR, no changes were made  He saw Dr. Furbish 06/01/24, his AFib symptomatic with fatigue, poor energy, since his ablation 2023, no symptoms to suggest recurrent arrhythmia, none via his watch, maintained on [facility]  Seeing Dr. 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 twice daily-started on 07/30/2023 Zometa  4 mg IV every 3 months        -next on 06/2024  Status post radiotherapy to right/left iliac and L1  The patient called this morning with reports of fast HRs130's-140's > advised to take his metoprolol  and to make a kardia tracing On f/u call reportedly did not show AFib Given an appt to come in   Today's visit is scheduled as an add on for elevated HRs ROS:   He comes today accompanied by his wife.  He has back pain associated with his prostate ca (mets), generally since on treatment has less energy, gets hot flashes. But cardiac-wise, feels very well!  He walks 3-5 days a week with good exertional capacity No CP, palpitations or cardiac awareness, not yesterday/today or otherwise. No dizziness, near syncope or syncope No bleeding or signs of bleeding  He was sitting down yesterday, doing nothing in particular when his watch gave him an alert that his HR was elevated while at rest ~ 140's He made a few Kardia tracings undetermined rhythm at about 3-4:00PM yesterday afternoon his HR settled back down to his usual 60s or 70's  He felt no  difference, felt well either way. He woke up today with his HR fast again  He reports excellent medication compliance with no missed Eliquis  dosing (or any of his meds)  Arrhythmia/AAD hx AFlutter found 2015 AFlutter ablation 2016, re-ocurred in environment of COVID illness >> and 09/12/21 (Dr. Kelsie) Subsequently AFib AFib ablation 09/02/22 (Dr. Furbish)   Studies Reviewed: SABRA    EKG done today and reviewed by myself:  SVT 146bpm, it is likely AFlutter 2:1   09/02/2022: EPS/ablation CONCLUSIONS: 1. Successful PVI 2.Intracardiac echo reveals no effusion 3. No early apparent complications.  TTE 06/21/2021  1. Left ventricular ejection fraction, by estimation, is 50 to 55%. The  left ventricle has low normal function. The left ventricle has no regional  wall motion abnormalities. Left ventricular diastolic parameters are  consistent with Grade I diastolic  dysfunction (impaired relaxation).   2. Right ventricular systolic function is normal. The right ventricular  size is normal.   3. The mitral valve is normal in structure. No evidence of mitral valve  regurgitation. No evidence of mitral stenosis.   4. The aortic valve is normal in structure. Aortic valve regurgitation is  trivial. Mild aortic valve sclerosis is present, with no evidence of  aortic valve stenosis.   5. The inferior vena cava is normal in size with greater than 50%  respiratory variability, suggesting right atrial pressure of 3 mmHg.     09/12/21: EPS/ablation CONCLUSIONS:   1. Sinus rhythm upon presentation  2. No inducible arrhythmias today  3. No accessory pathways or dual AV nodal physiology  4. Return of conduction throught the cavotricuspid isthmus  5. Successful radiofrequency ablation of atrial flutter along the cavotricuspid isthmus with complete bidirectional isthmus block achieved.   6. No inducible arrhythmias following ablation.   7. No early apparent complications.   11/24/2014:  EPS/ablation CONCLUSIONS:  1. Sinus rhythm upon presentation.  2. Sustained Atrial flutter, not induced today  3. Empiric cavotricuspid isthmus ablation was performed with complete bidirectional isthmus block achieved.  4. No inducible arrhythmias following ablation both on and off of dobutamine .  5. No early apparent complications.    Risk Assessment/Calculations:    Physical Exam:   VS:  There were no vitals taken for this visit.   Wt Readings from Last 3 Encounters:  07/11/24 223 lb (101.2 kg)  07/07/24 218 lb 9.6 oz (99.2 kg)  06/30/24 220 lb (99.8 kg)    GEN: Well nourished, well developed in no acute distress NECK: No JVD; No carotid bruits CARDIAC: RRR, tachycardic though slows transiently a few times, no murmurs, rubs, gallops RESPIRATORY:  *** CTA b/l without rales, wheezing or rhonchi  ABDOMEN: Soft, non-tender, non-distended EXTREMITIES: *** No edema; No deformity   ASSESSMENT AND PLAN: .    Paroxysmal  AFib CHA2DS2Vasc is 3, on Eliquis , appropriatetly dosed EKG appears an AFlutter w/RVR Suspect atypical give 2 prior CTI ablations  During his exam he is more irregular In my review of his kardia tracings, even with older tracings in the 60's that were determined SR, P waves are difficult, but rhythm is regular YESTERDAY Was eith fast 140's or slower 60's-70's Some of the slower were in a bigeminal rhythm > perhaps suggesting SR w/PACs Some more irregular, ? AFib Quite a bit of baseline motion  Stop amlodipine  Increase his Toprol  to 100mg  BID See the AFib clinic Friday morning He will keep track of his HR Should he have persistent HRs 140's and start to feel poorly to seek attention  HTN Med adjustment as above  Secondary hypercoagulable state 2/2 AFib   Dispo: as above  Signed, Charlies Macario Arthur, PA-C

## 2024-07-27 NOTE — Telephone Encounter (Signed)
 STAT if HR is under 50 or over 120  (normal HR is 60-100 beats per minute)  What is your heart rate? 144   Do you have a log of your heart rate readings (document readings)?  138 - yesterday  144 - today  Do you have any other symptoms? He states sometimes he has hot flashes, but denies SOB, CP, dizziness

## 2024-07-27 NOTE — Telephone Encounter (Signed)
 Spoke with patient regarding elevated HR  12:30 pm HR was 138 and around 2:30-2:45 down to 68 His am at 8 HR 144, had not taken his Metoprolol    Advised patient to take Metoprolol  and do [facility] he has, would call back   Called patient back after 15 min, Kardia didn't show Afib/flutter however rhythm not detected.   Scheduled patient to see Jenna PENNER PA today   Advised patient would call back prior to visit to see how his HR was doing

## 2024-07-27 NOTE — Patient Instructions (Signed)
 Medication Instructions:   START TAKING:  TOPROL   100 MG TWICE A  DAY   STOP TAKING AND REMOVE THIS MEDICATION FROM YOUR MEDICATION LIST:  AMLODIPINE     *If you need a refill on your cardiac medications before your next appointment, please call your pharmacy*  Lab Work: NONE ORDERED  TODAY   If you have labs (blood work) drawn today and your tests are completely normal, you will receive your results only by: MyChart Message (if you have MyChart) OR A paper copy in the mail If you have any lab test that is abnormal or we need to change your treatment, we will call you to review the results.  Testing/Procedures: NONE ORDERED  TODAY    Follow-Up: At [facility], you and your health needs are our priority.  As part of our continuing mission to provide you with exceptional heart care, our providers are all part of one team.  This team includes your primary Cardiologist (physician) and Advanced Practice Providers or APPs (Physician Assistants and Nurse Practitioners) who all work together to provide you with the care you need, when you need it.  Your next appointment:   AS SCHEDULED WITH AFIB CLINIC      We recommend signing up for the patient portal called MyChart.  Sign up information is provided on this After Visit Summary.  MyChart is used to connect with patients for Virtual Visits (Telemedicine).  Patients are able to view lab/test results, encounter notes, upcoming appointments, etc.  Non-urgent messages can be sent to your provider as well.   To learn more about what you can do with MyChart, go to [email or web address].   Other Instructions

## 2024-07-27 NOTE — Telephone Encounter (Signed)
 Called patient back, HR still elevated   Will forward to [employer] so she will be aware

## 2024-07-29 ENCOUNTER — Inpatient Hospital Stay (HOSPITAL_BASED_OUTPATIENT_CLINIC_OR_DEPARTMENT_OTHER): Admitting: Hematology & Oncology

## 2024-07-29 ENCOUNTER — Encounter (HOSPITAL_COMMUNITY): Payer: Self-pay | Admitting: Internal Medicine

## 2024-07-29 ENCOUNTER — Inpatient Hospital Stay: Attending: Hematology & Oncology

## 2024-07-29 ENCOUNTER — Encounter: Payer: Self-pay | Admitting: *Deleted

## 2024-07-29 ENCOUNTER — Encounter: Payer: Self-pay | Admitting: Hematology & Oncology

## 2024-07-29 ENCOUNTER — Ambulatory Visit (HOSPITAL_COMMUNITY)
Admission: RE | Admit: 2024-07-29 | Discharge: 2024-07-29 | Disposition: A | Discharge status: Home / Self Care | Source: Ambulatory Visit | Attending: Internal Medicine | Admitting: Internal Medicine

## 2024-07-29 VITALS — BP 90/72 | HR 72 | Ht 72.0 in | Wt 222.4 lb

## 2024-07-29 VITALS — BP 92/60 | HR 74 | Temp 98.1°F | Resp 20 | Ht 72.0 in | Wt 222.1 lb

## 2024-07-29 DIAGNOSIS — Z191 Hormone sensitive malignancy status: Secondary | ICD-10-CM | POA: Insufficient documentation

## 2024-07-29 DIAGNOSIS — I480 Paroxysmal atrial fibrillation: Secondary | ICD-10-CM

## 2024-07-29 DIAGNOSIS — D6869 Other thrombophilia: Secondary | ICD-10-CM

## 2024-07-29 DIAGNOSIS — I4819 Other persistent atrial fibrillation: Secondary | ICD-10-CM

## 2024-07-29 DIAGNOSIS — C7951 Secondary malignant neoplasm of bone: Secondary | ICD-10-CM | POA: Diagnosis not present

## 2024-07-29 DIAGNOSIS — Z79899 Other long term (current) drug therapy: Secondary | ICD-10-CM | POA: Diagnosis not present

## 2024-07-29 DIAGNOSIS — Z923 Personal history of irradiation: Secondary | ICD-10-CM | POA: Insufficient documentation

## 2024-07-29 DIAGNOSIS — Z7984 Long term (current) use of oral hypoglycemic drugs: Secondary | ICD-10-CM | POA: Diagnosis not present

## 2024-07-29 DIAGNOSIS — C61 Malignant neoplasm of prostate: Secondary | ICD-10-CM

## 2024-07-29 DIAGNOSIS — Z7901 Long term (current) use of anticoagulants: Secondary | ICD-10-CM | POA: Diagnosis not present

## 2024-07-29 DIAGNOSIS — E291 Testicular hypofunction: Secondary | ICD-10-CM | POA: Diagnosis not present

## 2024-07-29 LAB — CBC WITH DIFFERENTIAL (CANCER CENTER ONLY)
Abs Immature Granulocytes: 0.01 K/uL (ref 0.00–0.07)
Basophils Absolute: 0 K/uL (ref 0.0–0.1)
Basophils Relative: 0 %
Eosinophils Absolute: 0.2 K/uL (ref 0.0–0.5)
Eosinophils Relative: 4 %
HCT: 38 % — ABNORMAL LOW (ref 39.0–52.0)
Hemoglobin: 12.5 g/dL — ABNORMAL LOW (ref 13.0–17.0)
Immature Granulocytes: 0 %
Lymphocytes Relative: 23 %
Lymphs Abs: 1.2 K/uL (ref 0.7–4.0)
MCH: 33.7 pg (ref 26.0–34.0)
MCHC: 32.9 g/dL (ref 30.0–36.0)
MCV: 102.4 fL — ABNORMAL HIGH (ref 80.0–100.0)
Monocytes Absolute: 0.6 K/uL (ref 0.1–1.0)
Monocytes Relative: 12 %
Neutro Abs: 3.2 K/uL (ref 1.7–7.7)
Neutrophils Relative %: 61 %
Platelet Count: 144 K/uL — ABNORMAL LOW (ref 150–400)
RBC: 3.71 MIL/uL — ABNORMAL LOW (ref 4.22–5.81)
RDW: 13.2 % (ref 11.5–15.5)
WBC Count: 5.2 K/uL (ref 4.0–10.5)
nRBC: 0 % (ref 0.0–0.2)

## 2024-07-29 LAB — IRON AND IRON BINDING CAPACITY (CC-WL,HP ONLY)
Iron: 172 ug/dL (ref 45–182)
Saturation Ratios: 51 % — ABNORMAL HIGH (ref 17.9–39.5)
TIBC: 337 ug/dL (ref 250–450)
UIBC: 165 ug/dL

## 2024-07-29 LAB — CMP (CANCER CENTER ONLY)
ALT: 32 U/L (ref 0–44)
AST: 37 U/L (ref 15–41)
Albumin: 4.4 g/dL (ref 3.5–5.0)
Alkaline Phosphatase: 49 U/L (ref 38–126)
Anion gap: 12 (ref 5–15)
BUN: 13 mg/dL (ref 8–23)
CO2: 25 mmol/L (ref 22–32)
Calcium: 9 mg/dL (ref 8.9–10.3)
Chloride: 106 mmol/L (ref 98–111)
Creatinine: 0.81 mg/dL (ref 0.61–1.24)
GFR, Estimated: >60 mL/min (ref >60)
Glucose, Bld: 101 mg/dL — ABNORMAL HIGH (ref 70–99)
Potassium: 4.3 mmol/L (ref 3.5–5.1)
Sodium: 143 mmol/L (ref 135–145)
Total Bilirubin: 0.4 mg/dL (ref 0.0–1.2)
Total Protein: 7 g/dL (ref 6.5–8.1)

## 2024-07-29 LAB — FERRITIN: Ferritin: 874 ng/mL — ABNORMAL HIGH (ref 24–336)

## 2024-07-29 LAB — VITAMIN B12: Vitamin B-12: 403 pg/mL (ref 180–914)

## 2024-07-29 NOTE — Progress Notes (Signed)
 Hematology and Oncology Follow Up Visit  William Phillips [id number] 07-26-1951 73 y.o. 07/29/2024   Principle Diagnosis:  Metastatic prostate cancer-castrate sensitive-oligometastatic  Current Therapy:   Eligard  45 mg IM every 6 months next dose 09/2024 (Urology) Nubeqa  600 mg p.o. twice daily-started on 07/30/2023 Zometa  4 mg IV every 3 months-next on 09/2024  Status post radiotherapy to right/left iliac and L1    Interim History:  William Phillips is back for follow-up.  Everything seems to be doing pretty well with respect to the prostate cancer.  He did see Dr. Shannon of Radiation Oncology.  Dr. Shannon is going to go ahead and start him on the series of shots for Xofigo.  This will be in 2 weeks.  The big news for William Phillips is that he had some paroxysmal atrial fibrillation events.  He is going to have a cardioversion I think next week or so.  I hate that he is having the problems with the atrial fibrillation.  His last PSA was still not detectable.  His testosterone  was quite low..  He has had no change in bowel or bladder habits.  He has had no nausea or vomiting.  He has had no chest wall pain.  He has had no shortness of breath.  He has had no cough.  He has had no leg swelling.  Overall, his performance status is probably ECOG 1.    Medications:  Current Outpatient Medications:    Accu-Chek Softclix Lancets lancets, Use as instructed, Disp: 100 each, Rfl: 12   Blood Glucose Monitoring Suppl DEVI, 1 each by Does not apply route in the morning, at noon, and at bedtime. May substitute to any manufacturer covered by patient's insurance., Disp: 1 each, Rfl: 0   Brimonidine Tartrate (LUMIFY) 0.025 % SOLN, Place 1 drop into both eyes every morning., Disp: , Rfl:    Calcium  Carbonate-Vit D-Min (CALCIUM  1200 PO), Take 1,200 mg by mouth daily., Disp: , Rfl:    Carboxymethylcellulose Sodium (LUBRICANT EYE DROPS OP), Place 1 drop into both eyes 2 (two) times daily as needed (Dry  eye)., Disp: , Rfl:    Clobetasol Propionate 0.05 % shampoo, Apply 1 application topically daily as needed (irritation)., Disp: , Rfl:    clotrimazole  (CLOTRIMAZOLE  ANTI-FUNGAL) 1 % cream, Apply 1 Application topically 2 (two) times daily., Disp: 30 g, Rfl: 0   darolutamide  (NUBEQA ) 300 MG tablet, Take 2 tablets (600 mg total) by mouth 2 (two) times daily with a meal., Disp: 120 tablet, Rfl: 3   ELIQUIS  5 MG TABS tablet, TAKE 1 TABLET BY MOUTH TWICE  DAILY, Disp: 200 tablet, Rfl: 2   fexofenadine  (ALLEGRA ) 180 MG tablet, Take 1 tablet (180 mg total) by mouth daily., Disp: , Rfl:    fluticasone  (FLONASE ) 50 MCG/ACT nasal spray, Place 2 sprays into both nostrils daily as needed for allergies or rhinitis., Disp: , Rfl: 2   glimepiride  (AMARYL ) 2 MG tablet, TAKE 1 TABLET BY MOUTH DAILY WITH BREAKFAST, Disp: 30 tablet, Rfl: 5   glucose blood test strip, Use as instructed, Disp: 100 each, Rfl: 12   ketoconazole (NIZORAL) 2 % shampoo, Apply 1 application topically daily as needed for irritation., Disp: , Rfl:    lisinopril  (ZESTRIL ) 10 MG tablet, Take 1 tablet (10 mg total) by mouth daily., Disp: 90 tablet, Rfl: 3   metoprolol  succinate (TOPROL -XL) 100 MG 24 hr tablet, Take 1 tablet (100 mg total) by mouth 2 (two) times daily. Take with or immediately following a meal.,  Disp: 180 tablet, Rfl: 3   Multiple Vitamin (MULTIVITAMIN) tablet, Take 1 tablet by mouth every evening., Disp: , Rfl:    omeprazole  (PRILOSEC) 20 MG capsule, TAKE 1 CAPSULE BY MOUTH DAILY, Disp: 100 capsule, Rfl: 2   ondansetron  (ZOFRAN ) 4 MG tablet, Take 1 tablet (4 mg total) by mouth every 8 (eight) hours as needed for nausea or vomiting., Disp: 40 tablet, Rfl: 3   oxyCODONE -acetaminophen  (PERCOCET/ROXICET) 5-325 MG tablet, Take 1 tablet by mouth every 8 (eight) hours as needed for severe pain (pain score 7-10)., Disp: 30 tablet, Rfl: 0   rosuvastatin  (CRESTOR ) 5 MG tablet, TAKE 1 TABLET (5 MG TOTAL) BY MOUTH DAILY., Disp: 90 tablet, Rfl:  3   sildenafil  (REVATIO ) 20 MG tablet, TAKE 1 TO 4 TABLETS BY MOUTH DAILY AS NEEDED, Disp: 90 tablet, Rfl: 0   silodosin  (RAPAFLO ) 8 MG CAPS capsule, TAKE 1 CAPSULE BY MOUTH DAILY  WITH BREAKFAST, Disp: 100 capsule, Rfl: 2   triamcinolone  cream (KENALOG ) 0.1 %, Apply 1 Application topically 3 (three) times daily., Disp: , Rfl:    VITAMIN D PO, Take 1 capsule by mouth daily. 1000 units daily., Disp: , Rfl:   Current Facility-Administered Medications:    leuprolide  (6 Month) (LUPRON ) injection 45 mg, 45 mg, Intramuscular, Once, [place], Adine PARAS., MD  Allergies:  Allergies  Allergen Reactions   Penicillins Swelling    irriation    Past Medical History, Surgical history, Social history, and Family History were reviewed and updated.  Review of Systems: Review of Systems  Constitutional: Negative.   HENT:  Negative.    Eyes: Negative.   Respiratory: Negative.    Cardiovascular: Negative.   Gastrointestinal: Negative.   Endocrine: Negative.   Musculoskeletal:  Positive for back pain and flank pain.  Neurological: Negative.   Hematological: Negative.   Psychiatric/Behavioral: Negative.      Physical Exam: Vital signs are temperature of 98.1.  Pulse 74.  Blood pressure 92/60.  Weight is 222 pounds.     Wt Readings from Last 3 Encounters:  07/29/24 222 lb 1.9 oz (100.8 kg)  07/29/24 222 lb 6.4 oz (100.9 kg)  07/27/24 223 lb (101.2 kg)    Physical Exam Vitals reviewed.  HENT:     Head: Normocephalic and atraumatic.  Eyes:     Pupils: Pupils are equal, round, and reactive to light.  Cardiovascular:     Rate and Rhythm: Normal rate and regular rhythm.     Heart sounds: Normal heart sounds.  Pulmonary:     Effort: Pulmonary effort is normal.     Breath sounds: Normal breath sounds.  Abdominal:     General: Bowel sounds are normal.     Palpations: Abdomen is soft.  Musculoskeletal:        General: No tenderness or deformity. Normal range of motion.     Cervical back:  Normal range of motion.  Lymphadenopathy:     Cervical: No cervical adenopathy.  Skin:    General: Skin is warm and dry.     Findings: No erythema or rash.  Neurological:     Mental Status: He is alert and oriented to person, place, and time.  Psychiatric:        Behavior: Behavior normal.        Thought Content: Thought content normal.        Judgment: Judgment normal.      Lab Results  Component Value Date   WBC 5.2 07/29/2024   HGB 12.5 (L) 07/29/2024  HCT 38.0 (L) 07/29/2024   MCV 102.4 (H) 07/29/2024   PLT 144 (L) 07/29/2024     Chemistry      Component Value Date/Time   NA 139 07/11/2024 1157   NA 139 08/05/2022 1606   K 4.1 07/11/2024 1157   CL 102 07/11/2024 1157   CO2 29 07/11/2024 1157   BUN 14 07/11/2024 1157   BUN 10 08/05/2022 1606   CREATININE 0.70 07/11/2024 1157   CREATININE 0.74 06/30/2024 0923   CREATININE 0.77 11/29/2014 1512      Component Value Date/Time   CALCIUM  9.2 07/11/2024 1157   ALKPHOS 52 07/11/2024 1157   AST 23 07/11/2024 1157   AST 42 (H) 06/30/2024 0923   ALT 20 07/11/2024 1157   ALT 34 06/30/2024 0923   BILITOT 0.6 07/11/2024 1157   BILITOT 0.5 06/30/2024 9076      Impression and Plan: Mr. Heber is a very nice 73 year old white male.  He has metastatic prostate cancer.  He has had radiotherapy.  This has helped a little bit.  We now will see about Xofigo for him.  I would like to hope that this will be effective.  He will get a shot monthly I think for 6 months.  I will like to see him back probably after Thanksgiving.  By then, I think he would have had hopefully 2 injections.  I hope everything goes well with the cardioversion.   Maude JONELLE Crease, MD 10/17/202512:27 PM

## 2024-07-29 NOTE — Patient Instructions (Addendum)
 Cardioversion scheduled for: 08/02/24 Tuesady 10:00am   - Arrive at the [facility] ([address])  and check in with ADMITTING at 10:00am   - Do not eat or drink anything after midnight the night prior to your procedure.   - Take all your morning medication (except diabetic medications) with a sip of water prior to arrival.  - Do NOT miss any doses of your blood thinner - if you should miss a dose or take a dose more than 4 hours late -- please notify our office immediately.  - You will not be able to drive home after your procedure. Please ensure you have a responsible adult to drive you home. You will need someone with you for 24 hours post procedure.     - Expect to be in the procedural area approximately 2 hours.   - If you feel as if you go back into normal rhythm prior to scheduled cardioversion, please notify our office immediately.   If your procedure is canceled in the cardioversion suite you will be charged a cancellation fee.

## 2024-07-29 NOTE — Progress Notes (Addendum)
 Primary Care Physician: Domenica Harlene LABOR, MD Referring Physician: Dr. Nancey Vinie JONETTA William Phillips is a 73 y.o. male with a h/o aflutter ablation x 2 with Dr. Kelsie, in 2016 and again in 09/2021. He was at his PCP's office recently and ekg showed afib. He was unaware but had noted some exertional dyspnea.  He was started on metoprolol  and Eliquis  5 mg bid for a CHA2DS2VASc score of 2. He is being treated for metastatic prostate cancer.  On follow up 07/29/24, patient is currently in Afib. Seen by Charlies Arthur, PA-C, on 10/15 noted to be in likely atrial flutter with RVR; patient possibly having paroxysmal episodes. Patient notes he has been out of rhythm since Tuesday and has been checking daily with Kardiamobile device. Toprol  increased to 100 mg BID. No missed doses of Eliquis .   Today, he denies symptoms of palpitations, chest pain, shortness of breath, orthopnea, PND, lower extremity edema, dizziness, presyncope, syncope, or neurologic sequela. The patient is tolerating medications without difficulties and is otherwise without complaint today.   Past Medical History:  Diagnosis Date   Allergic rhinitis    Allergy 1953   Ankle fracture 1998   Asthma 1953   Atrial flutter (HCC)    a. s/p CTI ablation by Dr Kelsie 11/2014   Back pain 08/05/2015   BPH (benign prostatic hyperplasia) 08/26/2015   Cancer (HCC) 0630/2024   Prostate Cancer   Family history of breast cancer    Family history of prostate cancer    Ganglion cyst of dorsum of right wrist 04/08/2017   GERD (gastroesophageal reflux disease)    Hammer toe of right foot 05/06/2013   History of carpal tunnel syndrome    History of radiation therapy    Dr. Lynwood Nasuti 11/16/2023-11/27/2023   Hyperlipidemia    Hypertension    Insect bite 04/23/2015   OA (osteoarthritis) 02/06/2013   knees   Pedal edema 04/08/2017   Preventative health care 04/27/2011   Had normal colonoscopy in 2011    Skin cancer 2020   Past Surgical  History:  Procedure Laterality Date   A-FLUTTER ABLATION N/A 09/12/2021   Procedure: A-FLUTTER ABLATION;  Surgeon: Kelsie Lynwood, MD;  Location: [facility]IVE [facility];  Service: Cardiovascular;  Laterality: N/A;   ABLATION  11/24/2014   CTI ablation by Dr Kelsie   ANKLE SURGERY     ATRIAL FIBRILLATION ABLATION N/A 09/02/2022   Procedure: ATRIAL FIBRILLATION ABLATION;  Surgeon: Nancey Eulas BRAVO, MD;  Location: [facility];  Service: Cardiovascular;  Laterality: N/A;   ATRIAL FLUTTER ABLATION N/A 11/24/2014   Procedure: ATRIAL FLUTTER ABLATION;  Surgeon: Lynwood Kelsie, MD;  Location: [facility] CATH LAB;  Service: Cardiovascular;  Laterality: N/A;   CARDIOVERSION N/A 07/14/2022   Procedure: CARDIOVERSION;  Surgeon: Mona Vinie BROCKS, MD;  Location: [facility] ENDOSCOPY;  Service: Cardiovascular;  Laterality: N/A;   Carpel tunnel     COLONOSCOPY  08/04/2002   Normal Exam- Dr Abran   EYE SURGERY  1990   Radial Keratotomy   FRACTURE SURGERY  1998   Broken ankle   HERNIA REPAIR  2008   INGUINAL HERNIA REPAIR  10/13/2006   TONSILLECTOMY      Current Outpatient Medications  Medication Sig Dispense Refill   Accu-Chek Softclix Lancets lancets Use as instructed 100 each 12   Ascorbic Acid (VITAMIN C PO) Take 1 tablet by mouth daily.     Blood Glucose Monitoring Suppl DEVI 1 each by Does not apply route in  the morning, at noon, and at bedtime. May substitute to any manufacturer covered by patient's insurance. 1 each 0   Brimonidine Tartrate (LUMIFY) 0.025 % SOLN Place 1 drop into both eyes every morning.     Calcium  Carbonate-Vit D-Min (CALCIUM  1200 PO) Take 1,200 mg by mouth daily.     Carboxymethylcellulose Sodium (LUBRICANT EYE DROPS OP) Place 1 drop into both eyes 2 (two) times daily as needed (Dry eye).     Clobetasol Propionate 0.05 % shampoo Apply 1 application topically daily as needed (irritation).     clotrimazole  (CLOTRIMAZOLE  ANTI-FUNGAL) 1 % cream Apply 1 Application topically 2 (two) times  daily. 30 g 0   darolutamide  (NUBEQA ) 300 MG tablet Take 2 tablets (600 mg total) by mouth 2 (two) times daily with a meal. 120 tablet 3   ELIQUIS  5 MG TABS tablet TAKE 1 TABLET BY MOUTH TWICE  DAILY 200 tablet 2   fexofenadine  (ALLEGRA ) 180 MG tablet Take 1 tablet (180 mg total) by mouth daily.     fluticasone  (FLONASE ) 50 MCG/ACT nasal spray Place 2 sprays into both nostrils daily as needed for allergies or rhinitis.  2   glimepiride  (AMARYL ) 2 MG tablet TAKE 1 TABLET BY MOUTH DAILY WITH BREAKFAST 30 tablet 5   glucose blood test strip Use as instructed 100 each 12   ketoconazole (NIZORAL) 2 % shampoo Apply 1 application topically daily as needed for irritation.     lisinopril  (ZESTRIL ) 10 MG tablet Take 1 tablet (10 mg total) by mouth daily. 90 tablet 3   metoprolol  succinate (TOPROL -XL) 100 MG 24 hr tablet Take 1 tablet (100 mg total) by mouth 2 (two) times daily. Take with or immediately following a meal. 180 tablet 3   Multiple Vitamin (MULTIVITAMIN) tablet Take 1 tablet by mouth every evening.     omeprazole  (PRILOSEC) 20 MG capsule TAKE 1 CAPSULE BY MOUTH DAILY 100 capsule 2   ondansetron  (ZOFRAN ) 4 MG tablet Take 1 tablet (4 mg total) by mouth every 8 (eight) hours as needed for nausea or vomiting. 40 tablet 3   oxyCODONE -acetaminophen  (PERCOCET/ROXICET) 5-325 MG tablet Take 1 tablet by mouth every 8 (eight) hours as needed for severe pain (pain score 7-10). 30 tablet 0   rosuvastatin  (CRESTOR ) 5 MG tablet TAKE 1 TABLET (5 MG TOTAL) BY MOUTH DAILY. 90 tablet 3   sildenafil  (REVATIO ) 20 MG tablet TAKE 1 TO 4 TABLETS BY MOUTH DAILY AS NEEDED 90 tablet 0   silodosin  (RAPAFLO ) 8 MG CAPS capsule TAKE 1 CAPSULE BY MOUTH DAILY  WITH BREAKFAST 100 capsule 2   triamcinolone  cream (KENALOG ) 0.1 % Apply 1 Application topically 3 (three) times daily.     VITAMIN D PO Take 1 capsule by mouth daily. 1000 units daily.     Current Facility-Administered Medications  Medication Dose Route Frequency  Provider Last Rate Last Admin   leuprolide  (6 Month) (LUPRON ) injection 45 mg  45 mg Intramuscular Once Stoneking, Adine PARAS., MD        Allergies  Allergen Reactions   Penicillins Swelling    irriation   ROS- All systems are reviewed and negative except as per the HPI above  Physical Exam: Vitals:   07/29/24 1025  BP: 90/72  Pulse: 72  Weight: 100.9 kg  Height: 6' (1.829 m)    Wt Readings from Last 3 Encounters:  07/29/24 100.9 kg  07/27/24 101.2 kg  07/11/24 101.2 kg    Labs: Lab Results  Component Value Date   NA  139 07/11/2024   K 4.1 07/11/2024   CL 102 07/11/2024   CO2 29 07/11/2024   GLUCOSE 157 (H) 07/11/2024   BUN 14 07/11/2024   CREATININE 0.70 07/11/2024   CALCIUM  9.2 07/11/2024   PHOS 3.2 06/20/2014   MG 2.1 06/02/2019   No results found for: INR Lab Results  Component Value Date   CHOL 156 07/11/2024   HDL 64.80 07/11/2024   LDLCALC 60 07/11/2024   TRIG 156.0 (H) 07/11/2024   GEN- The patient is well appearing, alert and oriented x 3 today.   Neck - no JVD or carotid bruit noted Lungs- Clear to ausculation bilaterally, normal work of breathing Heart- Irregular rate and rhythm, no murmurs, rubs or gallops, PMI not laterally displaced Extremities- no clubbing, cyanosis, or edema Skin - no rash or ecchymosis noted   EKG-  Vent. rate 72 BPM PR interval * ms QRS duration 86 ms QT/QTcB 402/440 ms P-R-T axes * 60 36 Atrial fibrillation Abnormal ECG When compared with ECG of 27-Jul-2024 15:27, PREVIOUS ECG IS PRESENT Reconfirmed by Terra Pac ([id number]) on 07/29/2024 10:54:52 AM  Echo 06/21/21: 1. Left ventricular ejection fraction, by estimation, is 50 to 55%. The  left ventricle has low normal function. The left ventricle has no regional  wall motion abnormalities. Left ventricular diastolic parameters are  consistent with Grade I diastolic  dysfunction (impaired relaxation).   2. Right ventricular systolic function is normal. The right  ventricular  size is normal.   3. The mitral valve is normal in structure. No evidence of mitral valve  regurgitation. No evidence of mitral stenosis.   4. The aortic valve is normal in structure. Aortic valve regurgitation is  trivial. Mild aortic valve sclerosis is present, with no evidence of  aortic valve stenosis.   5. The inferior vena cava is normal in size with greater than 50%  respiratory variability, suggesting right atrial pressure of 3 mmHg.   CHA2DS2-VASc Score = 2  The patient's score is based upon: CHF History: 0 HTN History: 1 Diabetes History: 0 Stroke History: 0 Vascular Disease History: 0 Age Score: 1 Gender Score: 0   {Confirm score is correct.  If not, click here to update score.  REFRESH note.  :1  ASSESSMENT AND PLAN: Paroxysmal Atrial Fibrillation / flutter(ICD10:  I48.0) The patient's CHA2DS2-VASc score is 2, indicating a 2.2% annual risk of stroke.    Patient is currently in Afib. We discussed the procedure cardioversion to try to convert to NSR. We discussed the risks vs benefits of this procedure and how ultimately we cannot predict whether a patient will have early return of arrhythmia post procedure. After discussion, the patient wishes to proceed with cardioversion. Labs drawn today.   Informed Consent   Shared Decision Making/Informed Consent The risks (stroke, cardiac arrhythmias rarely resulting in the need for a temporary or permanent pacemaker, skin irritation or burns and complications associated with conscious sedation including aspiration, arrhythmia, respiratory failure and death), benefits (restoration of normal sinus rhythm) and alternatives of a direct current cardioversion were explained in detail to Mr. Vespa and he agrees to proceed.       Secondary Hypercoagulable State (ICD10:  D68.69) The patient is at significant risk for stroke/thromboembolism based upon his CHA2DS2-VASc Score of 2.  Continue Apixaban  (Eliquis ).  No missed  doses.    Follow up 2 weeks after DCCV.   Dorn Terra, [facility] [address] [place], [place] [address] [phone]

## 2024-07-29 NOTE — Progress Notes (Signed)
 Patient is beginning treatment with Xofigo. His first treatment is scheduled for 08/11/2024  Oncology Nurse Navigator Documentation     07/29/2024    1:15 PM  Oncology Nurse Navigator Flowsheets  Navigator Follow Up Date: 09/16/2024  Navigator Follow Up Reason: Follow-up Appointment  Navigator Location [facility]  Navigator Encounter Type Appt/Treatment Plan Review  Patient Visit Type MedOnc  Treatment Phase Active Tx  Barriers/Navigation Needs No Barriers At This Time  Interventions None Required  Acuity Level 1-No Barriers  Support Groups/Services Friends and Family  Time Spent with Patient 15

## 2024-07-30 ENCOUNTER — Ambulatory Visit: Payer: Self-pay | Admitting: Hematology & Oncology

## 2024-07-30 LAB — PSA, TOTAL AND FREE
PSA, Free Pct: UNDETERMINED %
PSA, Free: <0.02 ng/mL
Prostate Specific Ag, Serum: <0.1 ng/mL (ref 0.0–4.0)

## 2024-07-30 LAB — TESTOSTERONE: Testosterone: 6 ng/dL — ABNORMAL LOW (ref 264–916)

## 2024-08-01 ENCOUNTER — Encounter: Payer: Self-pay | Admitting: Hematology & Oncology

## 2024-08-01 DIAGNOSIS — M5459 Other low back pain: Secondary | ICD-10-CM | POA: Diagnosis not present

## 2024-08-01 DIAGNOSIS — M533 Sacrococcygeal disorders, not elsewhere classified: Secondary | ICD-10-CM | POA: Diagnosis not present

## 2024-08-01 DIAGNOSIS — M5416 Radiculopathy, lumbar region: Secondary | ICD-10-CM | POA: Diagnosis not present

## 2024-08-01 NOTE — Telephone Encounter (Signed)
 Advised via MyChart.

## 2024-08-01 NOTE — Telephone Encounter (Signed)
-----   Message from Maude JONELLE Crease sent at 07/30/2024  6:40 AM EDT ----- Please call and let him know that the PSA is still not detectable.  Jeralyn ----- Message ----- From: Rebecka, [facility] [place] Sent: 07/29/2024  11:57 AM EDT To: Maude JONELLE Crease, MD

## 2024-08-01 NOTE — Progress Notes (Signed)
 Called patient with pre-procedure instructions for tomorrow.   Patient informed of:   Time to arrive for procedure. 0900 Remain NPO past midnight.  Must have a ride home and a responsible adult to remain with them for 24 hours post procedure.  Confirmed blood thinner. Eliquis  Confirmed no breaks in taking blood thinner for 3+ weeks prior to procedure. Confirmed patient stopped all GLP-1s and GLP-2s for at least one week before procedure.

## 2024-08-02 ENCOUNTER — Telehealth (HOSPITAL_BASED_OUTPATIENT_CLINIC_OR_DEPARTMENT_OTHER): Payer: Self-pay | Admitting: *Deleted

## 2024-08-02 ENCOUNTER — Ambulatory Visit (HOSPITAL_COMMUNITY): Admission: RE | Admit: 2024-08-02 | Source: Home / Self Care | Admitting: Cardiology

## 2024-08-02 ENCOUNTER — Encounter: Payer: Self-pay | Admitting: Cardiology

## 2024-08-02 ENCOUNTER — Encounter (HOSPITAL_COMMUNITY): Admission: RE | Payer: Self-pay | Source: Home / Self Care

## 2024-08-02 ENCOUNTER — Telehealth (HOSPITAL_COMMUNITY): Payer: Self-pay | Admitting: *Deleted

## 2024-08-02 SURGERY — CARDIOVERSION (CATH LAB)
Anesthesia: General

## 2024-08-02 NOTE — Telephone Encounter (Signed)
   Pre-operative Risk Assessment    Patient Name: William Phillips  DOB: 05/31/1951 MRN: [id number]   Date of last office visit: 07/29/24, Fairy Heinrich, PA  Date of next office visit: needs 2 week follow up s/p DCCV 08/02/24   Request for Surgical Clearance    Procedure:  Right L3-L4 and L4-L5 transforaminal epidural steroid injection  Date of Surgery:  Clearance TBD                                Surgeon: Dr. Ozell Finney Surgeon's Group or Practice Name:  [facility]  Phone number:  [phone] Fax number:  [phone]   Type of Clearance Requested:   - Pharmacy:  Hold Apixaban  (Eliquis ) 3 days prior to injection   Type of Anesthesia:  Epidural   Additional requests/questions:    Bonney Burnard Cha   08/02/2024, 3:24 PM

## 2024-08-02 NOTE — Telephone Encounter (Signed)
 Pt called and reported converting to NSR with HR in the 80's and stable BP. DCCV scheduled for today was cancelled. Updated [facility]. and no med changes recommended at this time. Pt will need to f/u with Charlies Arthur in 3 months. Pt will continue to monitor HR and BP and call for any concerns.

## 2024-08-03 ENCOUNTER — Encounter: Payer: Self-pay | Admitting: Family Medicine

## 2024-08-03 ENCOUNTER — Telehealth: Payer: Self-pay | Admitting: *Deleted

## 2024-08-03 ENCOUNTER — Encounter: Payer: Self-pay | Admitting: Hematology & Oncology

## 2024-08-03 NOTE — Telephone Encounter (Signed)
 CALLED PATIENT TO REMIND OF LAB AND WEIGHT FOR 08-04-24- ARRIVAL TIME- 12 PM @ [facility], LVM FOR A RETURN CALL

## 2024-08-04 ENCOUNTER — Ambulatory Visit
Admission: RE | Admit: 2024-08-04 | Discharge: 2024-08-04 | Disposition: A | Discharge status: Home / Self Care | Source: Ambulatory Visit | Attending: Radiation Oncology | Admitting: Radiation Oncology

## 2024-08-04 DIAGNOSIS — C7951 Secondary malignant neoplasm of bone: Secondary | ICD-10-CM | POA: Diagnosis not present

## 2024-08-04 DIAGNOSIS — Z923 Personal history of irradiation: Secondary | ICD-10-CM | POA: Diagnosis not present

## 2024-08-04 DIAGNOSIS — Z79899 Other long term (current) drug therapy: Secondary | ICD-10-CM | POA: Diagnosis not present

## 2024-08-04 DIAGNOSIS — E895 Postprocedural testicular hypofunction: Secondary | ICD-10-CM | POA: Diagnosis not present

## 2024-08-04 DIAGNOSIS — C61 Malignant neoplasm of prostate: Secondary | ICD-10-CM | POA: Insufficient documentation

## 2024-08-04 LAB — CBC WITH DIFFERENTIAL (CANCER CENTER ONLY)
Abs Immature Granulocytes: 0.03 K/uL (ref 0.00–0.07)
Basophils Absolute: 0 K/uL (ref 0.0–0.1)
Basophils Relative: 0 %
Eosinophils Absolute: 0.2 K/uL (ref 0.0–0.5)
Eosinophils Relative: 3 %
HCT: 37 % — ABNORMAL LOW (ref 39.0–52.0)
Hemoglobin: 12.6 g/dL — ABNORMAL LOW (ref 13.0–17.0)
Immature Granulocytes: 1 %
Lymphocytes Relative: 15 %
Lymphs Abs: 0.9 K/uL (ref 0.7–4.0)
MCH: 34.3 pg — ABNORMAL HIGH (ref 26.0–34.0)
MCHC: 34.1 g/dL (ref 30.0–36.0)
MCV: 100.8 fL — ABNORMAL HIGH (ref 80.0–100.0)
Monocytes Absolute: 0.4 K/uL (ref 0.1–1.0)
Monocytes Relative: 7 %
Neutro Abs: 4.2 K/uL (ref 1.7–7.7)
Neutrophils Relative %: 74 %
Platelet Count: 148 K/uL — ABNORMAL LOW (ref 150–400)
RBC: 3.67 MIL/uL — ABNORMAL LOW (ref 4.22–5.81)
RDW: 13.2 % (ref 11.5–15.5)
WBC Count: 5.6 K/uL (ref 4.0–10.5)
nRBC: 0 % (ref 0.0–0.2)

## 2024-08-04 LAB — CMP (CANCER CENTER ONLY)
ALT: 25 U/L (ref 0–44)
AST: 37 U/L (ref 15–41)
Albumin: 3.9 g/dL (ref 3.5–5.0)
Alkaline Phosphatase: 53 U/L (ref 38–126)
Anion gap: 8 (ref 5–15)
BUN: 17 mg/dL (ref 8–23)
CO2: 27 mmol/L (ref 22–32)
Calcium: 8.5 mg/dL — ABNORMAL LOW (ref 8.9–10.3)
Chloride: 107 mmol/L (ref 98–111)
Creatinine: 0.84 mg/dL (ref 0.61–1.24)
GFR, Estimated: >60 mL/min (ref >60)
Glucose, Bld: 153 mg/dL — ABNORMAL HIGH (ref 70–99)
Potassium: 4.2 mmol/L (ref 3.5–5.1)
Sodium: 142 mmol/L (ref 135–145)
Total Bilirubin: 0.5 mg/dL (ref 0.0–1.2)
Total Protein: 6.7 g/dL (ref 6.5–8.1)

## 2024-08-05 ENCOUNTER — Ambulatory Visit: Payer: Self-pay | Admitting: Hematology & Oncology

## 2024-08-05 LAB — PSA, TOTAL AND FREE
PSA, Free Pct: UNDETERMINED %
PSA, Free: <0.02 ng/mL
Prostate Specific Ag, Serum: <0.1 ng/mL (ref 0.0–4.0)

## 2024-08-05 LAB — TESTOSTERONE: Testosterone: 11 ng/dL — ABNORMAL LOW (ref 264–916)

## 2024-08-06 ENCOUNTER — Other Ambulatory Visit: Payer: Self-pay | Admitting: Family

## 2024-08-06 DIAGNOSIS — C61 Malignant neoplasm of prostate: Secondary | ICD-10-CM

## 2024-08-06 MED ORDER — OXYCODONE-ACETAMINOPHEN 5-325 MG PO TABS: 1.0000 | ORAL_TABLET | Freq: Three times a day (TID) | ORAL | 0 refills | Status: DC | PRN

## 2024-08-07 MED ORDER — OXYCODONE-ACETAMINOPHEN 5-325 MG PO TABS: 1.0000 | ORAL_TABLET | Freq: Three times a day (TID) | ORAL | 0 refills | Status: DC | PRN

## 2024-08-09 ENCOUNTER — Telehealth: Payer: Self-pay | Admitting: *Deleted

## 2024-08-09 NOTE — Progress Notes (Signed)
 SABRA

## 2024-08-09 NOTE — Progress Notes (Addendum)
  Radiation Oncology         [phone] ________________________________  Name: William Phillips MRN: [id number]  Date:08/11/2024  DOB: 1950/12/13  Radium-223 Infusion Note  Diagnosis:  Castration resistant prostate cancer with painful bone involvement  Current Infusion:    1  Planned Infusions:  6  Narrative: William Phillips presented to nuclear medicine for treatment. His most recent blood counts were reviewed.  He remains a good candidate to proceed with Ra-223.  The patient was situated in an infusion suite with a contact barrier placed under his arm. Intravenous access was established, using sterile technique, and a normal saline infusion from a syringe was started.  Micro-dosimetry:  The prescribed radiation activity was assayed and confirmed to be within specified tolerance.  Special Treatment Procedure - Infusion:  The nuclear medicine technologist and I personally verified the dose activity to be delivered as specified in the written directive, and verified the patient identification via 2 separate methods.  The syringe containing the dose was attached to a 3 way stopcock, and then the valve was opened to the patient, and the dose delivered over a minute. No complications were noted.  The total administered dose was 157.5 microcuries in a volume of 5.74cc.  A saline flush of the line and the syringe that contained the isotope was then performed.  The residual radioactivity in the syringe was 4.14 microcuries, so the actual infused isotope activity was 153.4 microcuries.   Pressure was applied to the venipuncture site, and a compression bandage placed.   Radiation Safety personnel were present to perform the discharge survey, as detailed on their documentation.   After a short period of observation, the patient had his IV removed.  Impression:  The patient tolerated his infusion relatively well.  Plan:  The patient will return in one month for ongoing care.     -----------------------------------  Lynwood CHARM Nasuti, PhD, MD

## 2024-08-09 NOTE — Telephone Encounter (Signed)
 Returned patient's phone call, spoke with patient

## 2024-08-09 NOTE — Addendum Note (Signed)
 Encounter addended by: Shannon Agent, MD on: 08/09/2024 2:32 PM  Actions taken: Pend clinical note

## 2024-08-09 NOTE — Addendum Note (Signed)
 Encounter addended by: Shannon Agent, MD on: 08/09/2024 10:58 AM  Actions taken: Clinical Note Signed

## 2024-08-10 ENCOUNTER — Telehealth: Payer: Self-pay | Admitting: *Deleted

## 2024-08-10 NOTE — Telephone Encounter (Signed)
 CALLED PATIENT TO REMIND OF XOFIGO INJECTION FOR 08-11-24- ARRIVAL TIME- 11:45 AM @ [facility], SPOKE WITH PATIENT AND HE IS AWARE OF THIS APPT.

## 2024-08-10 NOTE — Telephone Encounter (Signed)
 Patient with diagnosis of afib on Eliquis  for anticoagulation.    Procedure: Right L3-L4 and L4-L5 transforaminal epidural steroid injection  Date of procedure: TBD   CHA2DS2-VASc Score = 2   This indicates a 2.2% annual risk of stroke. The patient's score is based upon: CHF History: 0 HTN History: 1 Diabetes History: 0 Stroke History: 0 Vascular Disease History: 0 Age Score: 1 Gender Score: 0      CrCl 97 ml/min Platelet count 148  Since patient had an afib episode recently that was > 2 days and self converted, would recommend delaying the procedure until at least 11/21 to allow for 4 weeks on anticoagulation and a 3 day hold prior to procedure.   Per office protocol, patient can hold Eliquis  for 3 days prior to procedure.  Procedure should be scheduled no earlier than 11/21  **This guidance is not considered finalized until pre-operative APP has relayed final recommendations.**

## 2024-08-10 NOTE — Telephone Encounter (Signed)
   Patient Name: William Phillips  DOB: 1951/07/05 MRN: [id number]  Primary Cardiologist: Redell Shallow, MD  Clinical pharmacists have reviewed the patient's past medical history, labs, and current medications as part of preoperative protocol coverage. The following recommendations have been made:  Patient with diagnosis of afib on Eliquis  for anticoagulation.     Procedure: Right L3-L4 and L4-L5 transforaminal epidural steroid injection  Date of procedure: TBD     CHA2DS2-VASc Score = 2   This indicates a 2.2% annual risk of stroke. The patient's score is based upon: CHF History: 0 HTN History: 1 Diabetes History: 0 Stroke History: 0 Vascular Disease History: 0 Age Score: 1 Gender Score: 0     CrCl 97 ml/min Platelet count 148   Since patient had an afib episode recently that was > 2 days and self converted, would recommend delaying the procedure until at least 11/21 to allow for 4 weeks on anticoagulation and a 3 day hold prior to procedure.    Per office protocol, patient can hold Eliquis  for 3 days prior to procedure.  Procedure should be scheduled no earlier than 09/02/2024         I will route this recommendation to the requesting party via Epic fax function and remove from pre-op  pool.  Please call with questions.  Lamarr Satterfield, NP 08/10/2024, 3:09 PM

## 2024-08-11 ENCOUNTER — Encounter (HOSPITAL_COMMUNITY)
Admission: RE | Admit: 2024-08-11 | Discharge: 2024-08-11 | Disposition: A | Discharge status: Home / Self Care | Source: Ambulatory Visit | Attending: Radiology | Admitting: Radiology

## 2024-08-11 DIAGNOSIS — C61 Malignant neoplasm of prostate: Secondary | ICD-10-CM | POA: Diagnosis present

## 2024-08-11 DIAGNOSIS — C7951 Secondary malignant neoplasm of bone: Secondary | ICD-10-CM | POA: Diagnosis present

## 2024-08-11 MED ORDER — RADIUM RA 223 DICHLORIDE 30 MCCI/ML IV SOLN
153.1600 | Freq: Once | INTRAVENOUS | Status: AC | PRN
Start: 2024-08-11 — End: 2024-08-11
  Administered 2024-08-11: 153.16 via INTRAVENOUS

## 2024-08-11 NOTE — Addendum Note (Signed)
 Encounter addended by: Shannon Agent, MD on: 08/11/2024 5:52 PM  Actions taken: Clinical Note Signed

## 2024-08-11 NOTE — Treatment Plan (Signed)
  Radiation Oncology         [phone] ________________________________  Name: JAEVIAN SHEAN MRN: [id number]  Date: 07/07/2024  DOB: 25-Jan-1951   To whom it may concern:   MARTAVIUS LUSTY is a patient with metastatic, castrate resistant prostate cancer with predominantly bone disease.  For details of his clinical history, please see the previously detailed consultation report that can be supplied with this letter.  I have recommended Radium 223 Dichloride treatment for monthly IV infusion over six months.  Based upon the randomized phase III Alsympca trial, we would expect an improvement in the patient's overall survival, improved pain control, decreased narcotic requirements, and decreased probability of skeletal related adverse events moving forward.  Given the data and this patient's situation, I feel that the delivery of Xofigo is medical necessity.  Please call if any further information is required to aid in this patient's treatment.    Sincerely yours,  Lynwood CHARM Nasuti, PhD, MD  -----------------------------------------------------------------------------------------------------------  Ref:  Kennyth BROCKS, Gaile GORMAN Cleora JONETTA, et al., Alpha emitter radium-223 and survival in metastatic prostate cancer. LOISE Diedra PARAS Med. 2013 Jul 18;369(3):213-23

## 2024-08-11 NOTE — Treatment Plan (Signed)
  Radiation Oncology         [phone] ________________________________  Name: William Phillips MRN: [id number]  Date: 07/07/2024  DOB: 11-15-1950  Xofigo Treatment Planning Note:  Diagnosis:  Castration resistant prostate cancer with painful bone involvement  Narrative: William Phillips is a patient who has been diagnosed with castration resistant prostate cancer with painful bone involvement.  His most recent blood counts show that he remains a good candidate to proceed with Ra-223.  The patient is going to receive Xofigo for his treatment.   Radiation Treatment Planning:  The prescribed radiation activity will be 50 kBq per kg per infusions. The plan is to offer a total of 6 IV administrations of this agent, assuming the blood counts are adequate prior to each administration, with each infusion done at 4 week intervals.  This will be done as an IV administration in the nuclear medicine department, with care to undertake all radiation protection precautions as recommended.    -----------------------------------  Lynwood CHARM Nasuti, PhD, MD

## 2024-08-12 ENCOUNTER — Telehealth: Payer: Self-pay | Admitting: *Deleted

## 2024-08-12 ENCOUNTER — Ambulatory Visit (HOSPITAL_COMMUNITY): Admitting: Internal Medicine

## 2024-08-12 NOTE — Telephone Encounter (Signed)
 Returned patient's phone call, spoke with patient

## 2024-08-17 ENCOUNTER — Telehealth: Payer: Self-pay | Admitting: *Deleted

## 2024-08-17 ENCOUNTER — Other Ambulatory Visit: Payer: Self-pay

## 2024-08-17 ENCOUNTER — Other Ambulatory Visit (HOSPITAL_COMMUNITY): Payer: Self-pay | Admitting: Radiation Oncology

## 2024-08-17 DIAGNOSIS — C61 Malignant neoplasm of prostate: Secondary | ICD-10-CM

## 2024-08-17 NOTE — Telephone Encounter (Signed)
 CALLED PATIENT TO INFORM OF LAB AND WEIGHT APPT. FOR 08-31-24 @ 12 PM @ [facility], AND HIS XOFIGO INJ. ON 09-07-24- ARRIVAL TIME- 11:45 AM @ [facility], SPOKE WITH PATIENT AND HE IS AWARE OF THESE APPTS. AND THE INSTRUCTIONS

## 2024-08-23 ENCOUNTER — Other Ambulatory Visit (HOSPITAL_COMMUNITY): Payer: Self-pay

## 2024-08-24 ENCOUNTER — Encounter: Payer: Self-pay | Admitting: Hematology & Oncology

## 2024-08-30 ENCOUNTER — Telehealth: Payer: Self-pay | Admitting: *Deleted

## 2024-08-30 NOTE — Telephone Encounter (Signed)
 CALLED PATIENT TO REMIND OF LAB AND WEIGHT FOR 08-31-24 @ 12 PM @ [facility], SPOKE WITH PATIENT AND HE IS AWARE OF THIS APPT.

## 2024-08-31 ENCOUNTER — Ambulatory Visit
Admission: RE | Admit: 2024-08-31 | Discharge: 2024-08-31 | Disposition: A | Discharge status: Home / Self Care | Source: Ambulatory Visit | Attending: Radiation Oncology | Admitting: Radiation Oncology

## 2024-08-31 DIAGNOSIS — C61 Malignant neoplasm of prostate: Secondary | ICD-10-CM | POA: Diagnosis present

## 2024-08-31 LAB — CBC WITH DIFFERENTIAL (CANCER CENTER ONLY)
Abs Immature Granulocytes: 0.01 K/uL (ref 0.00–0.07)
Basophils Absolute: 0 K/uL (ref 0.0–0.1)
Basophils Relative: 1 %
Eosinophils Absolute: 0.2 K/uL (ref 0.0–0.5)
Eosinophils Relative: 6 %
HCT: 35.7 % — ABNORMAL LOW (ref 39.0–52.0)
Hemoglobin: 12.1 g/dL — ABNORMAL LOW (ref 13.0–17.0)
Immature Granulocytes: 0 %
Lymphocytes Relative: 31 %
Lymphs Abs: 1 K/uL (ref 0.7–4.0)
MCH: 35.1 pg — ABNORMAL HIGH (ref 26.0–34.0)
MCHC: 33.9 g/dL (ref 30.0–36.0)
MCV: 103.5 fL — ABNORMAL HIGH (ref 80.0–100.0)
Monocytes Absolute: 0.4 K/uL (ref 0.1–1.0)
Monocytes Relative: 15 %
Neutro Abs: 1.4 K/uL — ABNORMAL LOW (ref 1.7–7.7)
Neutrophils Relative %: 47 %
Platelet Count: 101 K/uL — ABNORMAL LOW (ref 150–400)
RBC: 3.45 MIL/uL — ABNORMAL LOW (ref 4.22–5.81)
RDW: 13.6 % (ref 11.5–15.5)
WBC Count: 3 K/uL — ABNORMAL LOW (ref 4.0–10.5)
nRBC: 0 % (ref 0.0–0.2)

## 2024-09-01 ENCOUNTER — Other Ambulatory Visit: Payer: Self-pay | Admitting: Family

## 2024-09-01 MED ORDER — OXYCODONE-ACETAMINOPHEN 5-325 MG PO TABS: 1.0000 | ORAL_TABLET | Freq: Three times a day (TID) | ORAL | 0 refills | Status: DC | PRN

## 2024-09-01 NOTE — Telephone Encounter (Signed)
 Requesting: oxycodone  5-325mg   Contract: None UDS: None Last Visit: 07/11/24 Next Visit:11/17/24 Last Refill: 08/07/24 #60 and 0RF   Please Advise

## 2024-09-02 ENCOUNTER — Other Ambulatory Visit: Payer: Self-pay | Admitting: Family Medicine

## 2024-09-02 ENCOUNTER — Emergency Department (HOSPITAL_BASED_OUTPATIENT_CLINIC_OR_DEPARTMENT_OTHER)
Admission: EM | Admit: 2024-09-02 | Discharge: 2024-09-02 | Disposition: A | Discharge status: Home / Self Care | Attending: Emergency Medicine | Admitting: Emergency Medicine

## 2024-09-02 ENCOUNTER — Ambulatory Visit: Payer: Self-pay

## 2024-09-02 ENCOUNTER — Other Ambulatory Visit: Payer: Self-pay

## 2024-09-02 ENCOUNTER — Encounter (HOSPITAL_BASED_OUTPATIENT_CLINIC_OR_DEPARTMENT_OTHER): Payer: Self-pay

## 2024-09-02 ENCOUNTER — Emergency Department (HOSPITAL_BASED_OUTPATIENT_CLINIC_OR_DEPARTMENT_OTHER)

## 2024-09-02 DIAGNOSIS — I10 Essential (primary) hypertension: Secondary | ICD-10-CM | POA: Diagnosis not present

## 2024-09-02 DIAGNOSIS — M4802 Spinal stenosis, cervical region: Secondary | ICD-10-CM | POA: Insufficient documentation

## 2024-09-02 DIAGNOSIS — I4891 Unspecified atrial fibrillation: Secondary | ICD-10-CM | POA: Diagnosis not present

## 2024-09-02 DIAGNOSIS — J45909 Unspecified asthma, uncomplicated: Secondary | ICD-10-CM | POA: Insufficient documentation

## 2024-09-02 DIAGNOSIS — Z8546 Personal history of malignant neoplasm of prostate: Secondary | ICD-10-CM | POA: Diagnosis not present

## 2024-09-02 DIAGNOSIS — Z87891 Personal history of nicotine dependence: Secondary | ICD-10-CM | POA: Diagnosis not present

## 2024-09-02 DIAGNOSIS — R519 Headache, unspecified: Secondary | ICD-10-CM | POA: Diagnosis present

## 2024-09-02 DIAGNOSIS — J329 Chronic sinusitis, unspecified: Secondary | ICD-10-CM | POA: Diagnosis not present

## 2024-09-02 DIAGNOSIS — Z7901 Long term (current) use of anticoagulants: Secondary | ICD-10-CM | POA: Diagnosis not present

## 2024-09-02 LAB — CBC WITH DIFFERENTIAL/PLATELET
Abs Immature Granulocytes: 0.02 K/uL (ref 0.00–0.07)
Basophils Absolute: 0 K/uL (ref 0.0–0.1)
Basophils Relative: 1 %
Eosinophils Absolute: 0.1 K/uL (ref 0.0–0.5)
Eosinophils Relative: 3 %
HCT: 38.1 % — ABNORMAL LOW (ref 39.0–52.0)
Hemoglobin: 12.8 g/dL — ABNORMAL LOW (ref 13.0–17.0)
Immature Granulocytes: 1 %
Lymphocytes Relative: 24 %
Lymphs Abs: 0.9 K/uL (ref 0.7–4.0)
MCH: 35.1 pg — ABNORMAL HIGH (ref 26.0–34.0)
MCHC: 33.6 g/dL (ref 30.0–36.0)
MCV: 104.4 fL — ABNORMAL HIGH (ref 80.0–100.0)
Monocytes Absolute: 0.6 K/uL (ref 0.1–1.0)
Monocytes Relative: 15 %
Neutro Abs: 2.3 K/uL (ref 1.7–7.7)
Neutrophils Relative %: 56 %
Platelets: 90 K/uL — ABNORMAL LOW (ref 150–400)
RBC: 3.65 MIL/uL — ABNORMAL LOW (ref 4.22–5.81)
RDW: 13.8 % (ref 11.5–15.5)
WBC: 3.9 K/uL — ABNORMAL LOW (ref 4.0–10.5)
nRBC: 0 % (ref 0.0–0.2)

## 2024-09-02 LAB — BASIC METABOLIC PANEL WITH GFR
Anion gap: 14 (ref 5–15)
BUN: 14 mg/dL (ref 8–23)
CO2: 24 mmol/L (ref 22–32)
Calcium: 9.1 mg/dL (ref 8.9–10.3)
Chloride: 105 mmol/L (ref 98–111)
Creatinine, Ser: 0.76 mg/dL (ref 0.61–1.24)
GFR, Estimated: >60 mL/min (ref >60)
Glucose, Bld: 118 mg/dL — ABNORMAL HIGH (ref 70–99)
Potassium: 4.2 mmol/L (ref 3.5–5.1)
Sodium: 143 mmol/L (ref 135–145)

## 2024-09-02 LAB — MAGNESIUM: Magnesium: 2.2 mg/dL (ref 1.7–2.4)

## 2024-09-02 MED ORDER — DEXAMETHASONE 4 MG PO TABS
8.0000 mg | ORAL_TABLET | Freq: Once | ORAL | Status: AC
  Administered 2024-09-02: 8 mg via ORAL
  Filled 2024-09-02: qty 2

## 2024-09-02 MED ORDER — OXYMETAZOLINE HCL 0.05 % NA SOLN
1.0000 | Freq: Two times a day (BID) | NASAL | 0 refills | Status: AC
Start: 2024-09-02 — End: 2024-09-05

## 2024-09-02 MED ORDER — LIDOCAINE 5 % EX PTCH
1.0000 | MEDICATED_PATCH | Freq: Once | CUTANEOUS | Status: DC
  Administered 2024-09-02: 1 via TRANSDERMAL
  Filled 2024-09-02: qty 1

## 2024-09-02 MED ORDER — SODIUM CHLORIDE 0.9 % IV BOLUS
1000.0000 mL | Freq: Once | INTRAVENOUS | Status: AC
  Administered 2024-09-02: 1000 mL via INTRAVENOUS

## 2024-09-02 MED ORDER — PSEUDOEPHEDRINE HCL 60 MG PO TABS: 60.0000 mg | ORAL_TABLET | Freq: Four times a day (QID) | ORAL | 0 refills | Status: DC | PRN

## 2024-09-02 MED ORDER — PROCHLORPERAZINE EDISYLATE 10 MG/2ML IJ SOLN
5.0000 mg | Freq: Once | INTRAMUSCULAR | Status: AC
  Administered 2024-09-02: 5 mg via INTRAVENOUS
  Filled 2024-09-02: qty 2

## 2024-09-02 MED ORDER — DIPHENHYDRAMINE HCL 50 MG/ML IJ SOLN
25.0000 mg | Freq: Once | INTRAMUSCULAR | Status: AC
  Administered 2024-09-02: 25 mg via INTRAVENOUS
  Filled 2024-09-02: qty 1

## 2024-09-02 MED ORDER — PROCHLORPERAZINE MALEATE 5 MG PO TABS: 5.0000 mg | ORAL_TABLET | Freq: Four times a day (QID) | ORAL | 0 refills | Status: DC | PRN

## 2024-09-02 MED ORDER — MAGNESIUM SULFATE 2 GM/50ML IV SOLN
2.0000 g | Freq: Once | INTRAVENOUS | Status: AC
  Administered 2024-09-02: 2 g via INTRAVENOUS
  Filled 2024-09-02: qty 50

## 2024-09-02 MED ORDER — OXYCODONE-ACETAMINOPHEN 5-325 MG PO TABS: 1.0000 | ORAL_TABLET | Freq: Three times a day (TID) | ORAL | 0 refills | Status: DC | PRN

## 2024-09-02 NOTE — Telephone Encounter (Signed)
 FYI Only or Action Required?: FYI only for provider: ED advised.  Patient was last seen in primary care on 07/11/2024 by Domenica Harlene LABOR, MD.  Called Nurse Triage reporting Headache.  Symptoms began several weeks ago.  Interventions attempted: Rest, hydration, or home remedies.  Symptoms are: gradually worsening.  Triage Disposition: Go to ED Now (Notify PCP)  Patient/caregiver understands and will follow disposition?: Yes, wife to transport, declines 911    Copied from CRM #[id number]. Topic: Clinical - Red Word Triage >> Sep 02, 2024  8:21 AM Emylou G wrote: [facility] that prompted transfer to Nurse Triage: headaches.. primarily on the right side.. really bad ( worsening ) pain in hip and leg cancer has spread from his prostate cancer Reason for Disposition  [1] SEVERE headache (e.g., excruciating) AND [2] worst headache of life  Answer Assessment - Initial Assessment Questions 1. LOCATION: Where does it hurt?      Right side 2. ONSET: When did the headache start? (e.g., minutes, hours, days)      10/19, gradually worsening  3. PATTERN: Does the pain come and go, or has it been constant since it started?     Intermittent, lasting longer 4. SEVERITY: How bad is the pain? and What does it keep you from doing?  (e.g., Scale 1-10; mild, moderate, or severe)     Worsening in severity 5. RECURRENT SYMPTOM: Have you ever had headaches before? If Yes, ask: When was the last time? and What happened that time?      No, rarely gets HA 6. CAUSE: What do you think is causing the headache?     Unknown 7. MIGRAINE: Have you been diagnosed with migraine headaches? If Yes, ask: Is this headache similar?      No 8. HEAD INJURY: Has there been any recent injury to your head?      None 9. OTHER SYMPTOMS: Do you have any other symptoms? (e.g., fever, stiff neck, eye pain, sore throat, cold symptoms)     Denies changes in speech, vision but pt's wife notes he is more  unsteady on his feet with HA but notes it could be related to hip/leg pain, worsening right sided HA, reports to wife worst HA of my life and no significant history of HA/migraine.Wife notes recent prostate cancer dx, agreeable to ED, declines 911 will transport to [facility]  Protocols used: [facility]

## 2024-09-02 NOTE — ED Triage Notes (Signed)
 Pt states that he has had some headaches for over a month. States that his PCP sent him to ED. Denies no loss of memory or vomiting. Pain is localized to right side of his head.

## 2024-09-02 NOTE — Telephone Encounter (Signed)
 Pt went to ER

## 2024-09-02 NOTE — ED Notes (Signed)
 Report received from [place], [place]. Assuming pt care at this time.

## 2024-09-02 NOTE — ED Provider Notes (Signed)
 [facility] Provider Note  CSN: [id number] Arrival date & time: 09/02/24 1123  Chief Complaint(s) Headache  HPI William Phillips is a 73 y.o. male with past medical history as below, significant for prostate cancer, atrial flutter, GERD, BPH who presents to the ED with complaint of headache, sinus congestion  Patient has been having headache over the last 2 months, over the past 2 weeks has become more persistent.  Headache is unilateral, right-sided, aching, throbbing sensation.  Improved with Tylenol  at home.  No vision or hearing changes, no weakness to extremities, numbness to extremities.  No speech disturbance, no behavior disturbance, no head trauma.  Denies similar headache in the past  Past Medical History Past Medical History:  Diagnosis Date   Allergic rhinitis    Allergy 1953   Ankle fracture 1998   Asthma 1953   Atrial flutter (HCC)    a. s/p CTI ablation by Dr Kelsie 11/2014   Back pain 08/05/2015   BPH (benign prostatic hyperplasia) 08/26/2015   Cancer (HCC) 0630/2024   Prostate Cancer   Family history of breast cancer    Family history of prostate cancer    Ganglion cyst of dorsum of right wrist 04/08/2017   GERD (gastroesophageal reflux disease)    Hammer toe of right foot 05/06/2013   History of carpal tunnel syndrome    History of radiation therapy    Dr. 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) 08/20/2023   Prostate cancer (HCC); PSA 5.3; GG 1,2;  unfavorable intermediate risk 07/02/2023   Incomplete bladder emptying 01/26/2023   Elevated PSA 12/19/2022   History of COVID-19 04/29/2021   BCC (basal cell carcinoma of skin) 02/16/2020   Educated about COVID-19 virus infection 06/05/2019   Preventative health care 11/25/2018   Nocturia 05/21/2018   Skin lesion of left arm 11/05/2017   Ganglion cyst of dorsum of right wrist 04/08/2017   Pedal edema 04/08/2017   BPH with obstruction/lower urinary tract symptoms 08/26/2015   Back pain 08/05/2015   Allergic dermatitis 05/21/2015   Hernia of abdominal cavity 04/23/2015   SVT (supraventricular tachycardia) 11/24/2014   Atrial fibrillation (HCC) 11/01/2014   Abnormal TSH 06/26/2014   Hammer toe of right foot 05/06/2013   Arthritis 02/06/2013   Herpes zoster 11/30/2012   Controlled type 2 diabetes mellitus without complication, without long-term current use of insulin (HCC) 10/24/2011   GERD 03/26/2010   Hyperlipidemia, mixed 01/05/2009   ERECTILE DYSFUNCTION 01/05/2009   Essential hypertension 01/05/2009   Allergic rhinitis 01/05/2009   Home Medication(s) Prior to Admission medications   Medication Sig Start Date End Date Taking? Authorizing Provider  oxymetazoline  (AFRIN NASAL SPRAY) 0.05 % nasal spray Place 1 spray into both nostrils 2 (two) times daily for 3 days. 09/02/24 09/05/24 Yes Elnor Savant A, DO  prochlorperazine  (COMPAZINE ) 5 MG tablet Take 1 tablet (5 mg total) by mouth every 6 (six) hours as needed (headache). 09/02/24  Yes Elnor Savant LABOR, DO  pseudoephedrine  (SUDAFED) 60 MG tablet Take 1 tablet (60 mg total) by mouth every 6 (six) hours as needed for congestion. 09/02/24  Yes Elnor Savant A, DO  Accu-Chek Softclix Lancets lancets Use as instructed 04/06/24   Almarie Waddell NOVAK, NP  Blood Glucose Monitoring Suppl DEVI 1 each by Does not apply route in the morning, at noon, and at bedtime. May substitute to any manufacturer covered by patient's insurance. 12/29/23   Almarie Waddell NOVAK, NP  Brimonidine Tartrate (LUMIFY) 0.025 % SOLN Place 1 drop into both eyes every morning.    [provider]  Calcium  Carbonate-Vit D-Min (CALCIUM  1200 PO) Take 1,200 mg by mouth daily.    [provider]  Carboxymethylcellulose Sodium (LUBRICANT EYE DROPS OP) Place 1 drop into both eyes 2 (two) times daily as needed (Dry eye).    [provider]  darolutamide  (NUBEQA ) 300 MG tablet Take 2 tablets (600 mg total) by mouth 2 (two) times daily with a meal. 04/27/24   Ennever, Maude SAUNDERS, MD  ELIQUIS  5 MG TABS tablet TAKE 1 TABLET BY MOUTH TWICE  DAILY 06/14/24   Pietro Redell RAMAN, MD  fexofenadine  (ALLEGRA ) 180 MG tablet Take 1 tablet (180 mg total) by mouth daily. Patient taking differently: Take 180 mg by mouth daily as needed for allergies. 10/23/20   Domenica Harlene LABOR, MD  fluticasone  (FLONASE ) 50 MCG/ACT nasal spray Place 2 sprays into both nostrils daily as needed for allergies or rhinitis. 10/23/20   Domenica Harlene LABOR, MD  glimepiride  (AMARYL ) 2 MG tablet TAKE 1 TABLET BY MOUTH DAILY WITH BREAKFAST 06/13/24   Timmy Maude SAUNDERS, MD  glucose blood test strip Use as instructed 04/06/24   Almarie Waddell NOVAK, NP  lisinopril  (ZESTRIL ) 10 MG tablet Take 1 tablet (10 mg total) by mouth daily. 02/22/24 07/29/24  Pietro Redell RAMAN, MD  metoprolol  succinate (TOPROL -XL) 100 MG 24 hr tablet Take 1 tablet (100 mg total) by mouth 2 (two) times daily. Take with or immediately following a meal. 07/27/24   Leverne Charlies Helling, PA-C  Multiple Vitamin (MULTIVITAMIN) tablet Take 1 tablet by mouth every evening.    [provider]  omeprazole  (PRILOSEC) 20 MG capsule TAKE 1 CAPSULE BY MOUTH DAILY 01/22/24   Domenica Harlene LABOR, MD  ondansetron  (ZOFRAN ) 4 MG tablet Take 1 tablet (4 mg total) by mouth every 8 (eight) hours as needed for nausea or vomiting. 12/14/23   Timmy Maude SAUNDERS, MD  oxyCODONE -acetaminophen  (PERCOCET/ROXICET) 5-325 MG tablet Take 1 tablet by mouth every 8 (eight) hours as needed for  severe pain (pain score 7-10) or moderate pain (pain score 4-6). 09/02/24   Domenica Harlene LABOR, MD  rosuvastatin  (CRESTOR ) 5 MG tablet TAKE 1 TABLET (5 MG TOTAL) BY MOUTH DAILY. 01/18/24   Pietro Redell RAMAN, MD  sildenafil  (REVATIO ) 20 MG tablet TAKE 1 TO 4 TABLETS BY MOUTH DAILY AS NEEDED 05/06/23   Domenica Harlene LABOR, MD  silodosin  (RAPAFLO ) 8 MG CAPS capsule TAKE 1 CAPSULE BY MOUTH DAILY  WITH BREAKFAST 06/20/24   Stoneking, Adine PARAS., MD  Past Surgical History Past Surgical History:  Procedure Laterality Date   A-FLUTTER ABLATION N/A 09/12/2021   Procedure: A-FLUTTER ABLATION;  Surgeon: Kelsie Agent, MD;  Location: [facility] IN[facility]ASIVE [facility];  Service: Cardiovascular;  Laterality: N/A;   ABLATION  11/24/2014   CTI ablation by Dr Kelsie   ANKLE SURGERY     ATRIAL FIBRILLATION ABLATION N/A 09/02/2022   Procedure: ATRIAL FIBRILLATION ABLATION;  Surgeon: Nancey Eulas BRAVO, MD;  Location: [facility] CV [facility];  Service: Cardiovascular;  Laterality: N/A;   ATRIAL FLUTTER ABLATION N/A 11/24/2014   Procedure: ATRIAL FLUTTER ABLATION;  Surgeon: Agent Kelsie, MD;  Location: [facility] CATH LAB;  Service: Cardiovascular;  Laterality: N/A;   CARDIOVERSION N/A 07/14/2022   Procedure: CARDIOVERSION;  Surgeon: Mona Vinie BROCKS, MD;  Location: [facility] ENDOSCOPY;  Service: Cardiovascular;  Laterality: N/A;   Carpel tunnel     COLONOSCOPY  08/04/2002   Normal Exam- Dr Abran   EYE SURGERY  1990   Radial Keratotomy   FRACTURE SURGERY  1998   Broken ankle   HERNIA REPAIR  2008   INGUINAL HERNIA REPAIR  10/13/2006   TONSILLECTOMY     Family History Family History  Problem Relation Age of Onset   Arthritis Mother    Stroke Mother    Coronary artery disease Father        aortic valve disease   Heart attack Father 15   COPD Father    Hypertension Father    Hyperlipidemia Father    Dementia Father     Breast cancer Sister 20   Diabetes Sister    Cancer Sister 54   Diabetes Brother    Prostate cancer Maternal Uncle    Cancer Maternal Uncle    Diabetes type II Maternal Grandfather    Diabetes Maternal Grandfather    Hyperlipidemia Other    Hypertension Other     Social History Social History   Tobacco Use   Smoking status: Former    Current packs/day: 0.00    Average packs/day: 1 pack/day for 10.0 years (10.0 ttl pk-yrs)    Types: Cigarettes    Start date: 55    Quit date: 1989    Years since quitting: 36.9   Smokeless tobacco: Never   Tobacco comments:    Former smoker 07/29/24  Vaping Use   Vaping status: Never Used  Substance Use Topics   Alcohol use: Yes    Alcohol/week: 10.0 standard drinks of alcohol    Types: 10 Glasses of wine per week    Comment: 1-2 drinks daily (wine) 07/29/24   Drug use: No   Allergies Penicillins  Review of Systems A thorough review of systems was obtained and all systems are negative except as noted in the HPI and PMH.   Physical Exam Vital Signs  I have reviewed the triage vital signs BP (!) 140/72   Pulse (!) 49   Temp 98.2 F (36.8 C) (Oral)   Resp 18   Ht 6' (1.829 m)   Wt 99.8 kg   SpO2 94%   BMI 29.84 kg/m  Physical Exam Vitals and nursing note reviewed.  Constitutional:      General: He is not in acute distress.    Appearance: He is well-developed.  HENT:     Head: Normocephalic and atraumatic.     Right Ear: External ear normal.     Left Ear: External ear normal.     Mouth/Throat:     Mouth: Mucous membranes are moist.  Eyes:  General: No visual field deficit or scleral icterus.    Extraocular Movements: Extraocular movements intact.     Pupils: Pupils are equal, round, and reactive to light.  Cardiovascular:     Rate and Rhythm: Normal rate and regular rhythm.     Pulses: Normal pulses.     Heart sounds: Normal heart sounds.  Pulmonary:     Effort: Pulmonary effort is normal. No respiratory  distress.     Breath sounds: Normal breath sounds.  Abdominal:     General: Abdomen is flat.     Palpations: Abdomen is soft.     Tenderness: There is no abdominal tenderness.  Musculoskeletal:     Cervical back: No rigidity.     Right lower leg: No edema.     Left lower leg: No edema.  Skin:    General: Skin is warm and dry.     Capillary Refill: Capillary refill takes less than 2 seconds.  Neurological:     Mental Status: He is alert and oriented to person, place, and time.     GCS: GCS eye subscore is 4. GCS verbal subscore is 5. GCS motor subscore is 6.     Cranial Nerves: Cranial nerves 2-12 are intact. No dysarthria or facial asymmetry.     Sensory: Sensation is intact. No sensory deficit.     Motor: Motor function is intact. No tremor or pronator drift.     Coordination: Coordination is intact. Finger-Nose-Finger Test and Heel to [place] Test normal.     Gait: Gait is intact.     Comments: Strength 5/5 to BLUE/BLLE, equal and symmetric    Psychiatric:        Mood and Affect: Mood normal.        Behavior: Behavior normal.     ED Results and Treatments Labs (all labs ordered are listed, but only abnormal results are displayed) Labs Reviewed  CBC WITH DIFFERENTIAL/PLATELET - Abnormal; Notable for the following components:      Result Value   WBC 3.9 (*)    RBC 3.65 (*)    Hemoglobin 12.8 (*)    HCT 38.1 (*)    MCV 104.4 (*)    MCH 35.1 (*)    Platelets 90 (*)    All other components within normal limits  BASIC METABOLIC PANEL WITH GFR - Abnormal; Notable for the following components:   Glucose, Bld 118 (*)    All other components within normal limits  MAGNESIUM                                                                                                                           Radiology CT Cervical Spine Wo Contrast Result Date: 09/02/2024 EXAM: CT CERVICAL SPINE WITHOUT CONTRAST 09/02/2024 02:10:00 PM TECHNIQUE: CT of the cervical spine was performed without  the administration of intravenous contrast. Multiplanar reformatted images are provided for review. Automated exposure control, iterative reconstruction, and/or weight based adjustment of the mA/kV was utilized to  reduce the radiation dose to as low as reasonably achievable. COMPARISON: None available. CLINICAL HISTORY: pain along occiput, right side of neck, right sided ha FINDINGS: CERVICAL SPINE: BONES AND ALIGNMENT: Straightening of the normal cervical lordosis is present. No acute fracture or traumatic malalignment. DEGENERATIVE CHANGES: Multilevel degenerative changes are present in the cervical spine. Asymmetric left-sided facet hypertrophy contributes to severe left foraminal stenosis at C3-C4. Unremarkable vertebral and facet hypertrophy contribute to moderate left foraminal narrowing at C5-C6 and moderate right foraminal narrowing at C6-C7. SOFT TISSUES: No prevertebral soft tissue swelling. IMPRESSION: 1. Severe left foraminal stenosis at C3-4 due to asymmetric left-sided facet hypertrophy. 2. Moderate left foraminal narrowing at C5-6 and moderate right foraminal narrowing at C6-7 due to vertebral and facet hypertrophy. 3. Straightening of the normal cervical lordosis. Electronically signed by: Lonni Necessary MD 09/02/2024 02:19 PM EST RP Workstation: [id number]   CT Head Wo Contrast Result Date: 09/02/2024 EXAM: CT HEAD WITHOUT CONTRAST 09/02/2024 02:10:00 PM TECHNIQUE: CT of the head was performed without the administration of intravenous contrast. Automated exposure control, iterative reconstruction, and/or weight based adjustment of the mA/kV was utilized to reduce the radiation dose to as low as reasonably achievable. COMPARISON: 02/15/2024 CLINICAL HISTORY: Headache, increasing frequency or severity; right sided headache, prostate cancer w/ concern mets. FINDINGS: BRAIN AND VENTRICLES: No acute hemorrhage. No evidence of acute infarct. No hydrocephalus. No extra-axial collection. No mass  effect or midline shift. Mild atrophy and white matter changes are stable and within normal limits for age. ORBITS: No acute abnormality. SINUSES: Bilateral maxillary and sphenoid mucosal thickening. Near-complete opacification of bilateral ethmoid air cells. SOFT TISSUES AND SKULL: No acute soft tissue abnormality. No skull fracture. VASCULATURE: Atherosclerotic calcifications within cavernous internal carotid arteries. IMPRESSION: 1. No acute intracranial abnormality. 2. Bilateral maxillary and sphenoid sinus mucosal thickening with near-complete opacification of the bilateral ethmoid air cells, compatible with acute on chronic sinusitis. Electronically signed by: Lonni Necessary MD 09/02/2024 02:16 PM EST RP Workstation: [id number]    Pertinent labs & imaging results that were available during my care of the patient were reviewed by me and considered in my medical decision making (see MDM for details).  Medications Ordered in ED Medications  lidocaine  (LIDODERM ) 5 % 1 patch (1 patch Transdermal Patch Applied 09/02/24 1333)  prochlorperazine  (COMPAZINE ) injection 5 mg (5 mg Intravenous Given 09/02/24 1347)  diphenhydrAMINE  (BENADRYL ) injection 25 mg (25 mg Intravenous Given 09/02/24 1328)  dexamethasone  (DECADRON ) tablet 8 mg (8 mg Oral Given 09/02/24 1328)  sodium chloride  0.9 % bolus 1,000 mL (0 mLs Intravenous Stopped 09/02/24 1520)  magnesium  sulfate IVPB 2 g 50 mL (0 g Intravenous Stopped 09/02/24 1520)                                                                                                                                     Procedures Procedures  (including critical care time)  Medical Decision Making / ED  Course    Medical Decision Making:    EVAAN TIDWELL is a 73 y.o. male with past medical history as below, significant for prostate cancer, atrial flutter, GERD, BPH who presents to the ED with complaint of headache, sinus congestion. The complaint involves an  extensive differential diagnosis and also carries with it a high risk of complications and morbidity.  Serious etiology was considered. Ddx includes but is not limited to: Differential diagnosis includes but is not exclusive to subarachnoid hemorrhage, meningitis, encephalitis, previous head trauma, cavernous venous thrombosis, muscle tension headache, glaucoma, temporal arteritis, migraine or migraine equivalent, etc.   Complete initial physical exam performed, notably the patient was in no distress, neuro intact.    Reviewed and confirmed nursing documentation for past medical history, family history, social history.  Vital signs reviewed.    Headache Sinusitis> - Neurologically is intact, HDS - Labs reviewed, these are stable - Will obtain CT imaging given persistence of headache and new onset.  CT head and C-spine is stable; sinusitis, cervical spinal stenosis - Symptoms greatly improved after migraine cocktail -Remains neurologically intact - Provide medication for home, recommend he follow-up with his PCP for recheck in the next week.  Encouraged supportive care   Clinical Course as of 09/02/24 1551  Fri Sep 02, 2024  1439 Symptoms improved  [SG]    Clinical Course User Index [SG] Elnor Jayson LABOR, DO     3:51 PM:  I have discussed the diagnosis/risks/treatment options with the patient and family.  Evaluation and diagnostic testing in the emergency department does not suggest an emergent condition requiring admission or immediate intervention beyond what has been performed at this time.  They will follow up with PCP. We also discussed returning to the ED immediately if new or worsening sx occur. We discussed the sx which are most concerning (e.g., sudden worsening pain, fever, inability to tolerate by mouth, severe headache, neurologic deficits) that necessitate immediate return.    The patient appears reasonably screened and/or stabilized for discharge and I doubt any other medical  condition or other [facility] requiring further screening, evaluation, or treatment in the ED at this time prior to discharge.                 Additional history obtained: -Additional history obtained from family -External records from outside source obtained and reviewed including: Chart review including previous notes, labs, imaging, consultation notes including  Primary care documentation, home medications   Lab Tests: -I ordered, reviewed, and interpreted labs.   The pertinent results include:   Labs Reviewed  CBC WITH DIFFERENTIAL/PLATELET - Abnormal; Notable for the following components:      Result Value   WBC 3.9 (*)    RBC 3.65 (*)    Hemoglobin 12.8 (*)    HCT 38.1 (*)    MCV 104.4 (*)    MCH 35.1 (*)    Platelets 90 (*)    All other components within normal limits  BASIC METABOLIC PANEL WITH GFR - Abnormal; Notable for the following components:   Glucose, Bld 118 (*)    All other components within normal limits  MAGNESIUM     Notable for labs are stable  EKG   EKG Interpretation Date/Time:    Ventricular Rate:    PR Interval:    QRS Duration:    QT Interval:    QTC Calculation:   R Axis:      Text Interpretation:  Imaging Studies ordered: I ordered imaging studies including CT head CT cervical I independently visualized the following imaging with scope of interpretation limited to determining acute life threatening conditions related to emergency care; findings noted above I agree with the radiologist interpretation If any imaging was obtained with contrast I closely monitored patient for any possible adverse reaction a/w contrast administration in the emergency department   Medicines ordered and prescription drug management: Meds ordered this encounter  Medications   prochlorperazine  (COMPAZINE ) injection 5 mg   diphenhydrAMINE  (BENADRYL ) injection 25 mg   dexamethasone  (DECADRON ) tablet 8 mg   sodium chloride  0.9 % bolus 1,000 mL    lidocaine  (LIDODERM ) 5 % 1 patch   magnesium  sulfate IVPB 2 g 50 mL   prochlorperazine  (COMPAZINE ) 5 MG tablet    Sig: Take 1 tablet (5 mg total) by mouth every 6 (six) hours as needed (headache).    Dispense:  15 tablet    Refill:  0   pseudoephedrine  (SUDAFED) 60 MG tablet    Sig: Take 1 tablet (60 mg total) by mouth every 6 (six) hours as needed for congestion.    Dispense:  20 tablet    Refill:  0   oxymetazoline  (AFRIN NASAL SPRAY) 0.05 % nasal spray    Sig: Place 1 spray into both nostrils 2 (two) times daily for 3 days.    Dispense:  15 mL    Refill:  0    -I have reviewed the patients home medicines and have made adjustments as needed   Consultations Obtained: Not applicable  Cardiac Monitoring: Continuous pulse oximetry interpreted by myself, 96% on room air.    Social Determinants of Health:  Diagnosis or treatment significantly limited by social determinants of health: former smoker   Reevaluation: After the interventions noted above, I reevaluated the patient and found that they have improved  Co morbidities that complicate the patient evaluation  Past Medical History:  Diagnosis Date   Allergic rhinitis    Allergy 1953   Ankle fracture 1998   Asthma 1953   Atrial flutter (HCC)    a. s/p CTI ablation by Dr Kelsie 11/2014   Back pain 08/05/2015   BPH (benign prostatic hyperplasia) 08/26/2015   Cancer (HCC) 0630/2024   Prostate Cancer   Family history of breast cancer    Family history of prostate cancer    Ganglion cyst of dorsum of right wrist 04/08/2017   GERD (gastroesophageal reflux disease)    Hammer toe of right foot 05/06/2013   History of carpal tunnel syndrome    History of radiation therapy    Dr. Lynwood Nasuti 11/16/2023-11/27/2023   Hyperlipidemia    Hypertension    Insect bite 04/23/2015   OA (osteoarthritis) 02/06/2013   knees   Pedal edema 04/08/2017   Preventative health care 04/27/2011   Had normal colonoscopy in 2011    Skin  cancer 2020      Dispostion: Disposition decision including need for hospitalization was considered, and patient discharged from emergency department.    Final Clinical Impression(s) / ED Diagnoses Final diagnoses:  Nonintractable headache, unspecified chronicity pattern, unspecified headache type  Sinusitis, unspecified chronicity, unspecified location  Spinal stenosis of cervical region        Elnor Jayson LABOR, DO 09/02/24 1551

## 2024-09-02 NOTE — Discharge Instructions (Addendum)
 It was a pleasure caring for you today in the emergency department.  Be sure to get plenty of rest over the next few days,   Please return to the emergency department for any worsening or worrisome symptoms.

## 2024-09-05 ENCOUNTER — Other Ambulatory Visit: Payer: Self-pay | Admitting: Family Medicine

## 2024-09-06 ENCOUNTER — Encounter: Payer: Self-pay | Admitting: Family Medicine

## 2024-09-06 ENCOUNTER — Telehealth: Payer: Self-pay | Admitting: *Deleted

## 2024-09-06 NOTE — Telephone Encounter (Signed)
 CALLED PATIENT TO REMIND OF XOFIGO  INJECTION FOR 09-07-24- ARRIVAL TIME- 11:45 AM @ [facility], SPOKE WITH PATIENT AND HE IS AWARE OF THIS INJECTION

## 2024-09-07 ENCOUNTER — Other Ambulatory Visit: Payer: Self-pay | Admitting: *Deleted

## 2024-09-07 ENCOUNTER — Ambulatory Visit: Admitting: Urology

## 2024-09-07 ENCOUNTER — Encounter (HOSPITAL_COMMUNITY)
Admission: RE | Admit: 2024-09-07 | Discharge: 2024-09-07 | Disposition: A | Discharge status: Home / Self Care | Source: Ambulatory Visit | Attending: Radiation Oncology | Admitting: Radiation Oncology

## 2024-09-07 DIAGNOSIS — C61 Malignant neoplasm of prostate: Secondary | ICD-10-CM | POA: Insufficient documentation

## 2024-09-07 MED ORDER — NUBEQA 300 MG PO TABS: 600.0000 mg | ORAL_TABLET | Freq: Two times a day (BID) | ORAL | 3 refills | Status: AC

## 2024-09-07 MED ORDER — RADIUM RA 223 DICHLORIDE 30 MCCI/ML IV SOLN
151.7100 | Freq: Once | INTRAVENOUS | Status: AC
  Administered 2024-09-07: 151.71 via INTRAVENOUS

## 2024-09-07 NOTE — Progress Notes (Signed)
  Radiation Oncology         [phone] ________________________________  Name: William Phillips MRN: [id number]  Date: 09/07/2024  DOB: 09-30-1951  Xofigo  Treatment Planning Note:  Diagnosis:  Castration resistant prostate cancer with painful bone involvement  Narrative: William Phillips is a patient who has been diagnosed with castration resistant prostate cancer with painful bone involvement.  His most recent blood counts show that he remains a good candidate to proceed with [id number].  The patient is going to receive Xofigo  for his treatment.   Radiation Treatment Planning:  The prescribed radiation activity will be 50 kBq per kg per infusions. The plan is to offer a total of 6 IV administrations of this agent, assuming the blood counts are adequate prior to each administration, with each infusion done at 4 week intervals.  This will be done as an IV administration in the nuclear medicine department, with care to undertake all radiation protection precautions as recommended.    -----------------------------------  William CHARM Nasuti, PhD, MD

## 2024-09-07 NOTE — Progress Notes (Signed)
  Radiation Oncology         [phone] ________________________________  Name: William Phillips MRN: [id number]  Date: 09/07/2024  DOB: [birth year]  Radium-223 Infusion Note  Diagnosis:  Castration resistant prostate cancer with painful bone involvement  Current Infusion:    2  Planned Infusions:  6  Narrative: [name] presented to nuclear medicine for treatment. His most recent blood counts were reviewed.  He remains a good candidate to proceed with Ra-223.  The patient was situated in an infusion suite with a contact barrier placed under his arm. Intravenous access was established, using sterile technique, and a normal saline infusion from a syringe was started.  Micro-dosimetry:  The prescribed radiation activity was assayed and confirmed to be within specified tolerance.  Special Treatment Procedure - Infusion:  The nuclear medicine technologist and I personally verified the dose activity to be delivered as specified in the written directive, and verified the patient identification via 2 separate methods.  The syringe containing the dose was attached to a 3 way stopcock, and then the valve was opened to the patient, and the dose delivered over a minute. No complications were noted.  The total administered dose was 156.3 microcuries.  A saline flush of the line and the syringe that contained the isotope was then performed.  The residual radioactivity in the syringe was 4.59 microcuries, so the actual infused isotope activity was 151.7 microcuries.   Pressure was applied to the venipuncture site, and a compression bandage placed.   Radiation Safety personnel were present to perform the discharge survey, as detailed on their documentation.   After a short period of observation, the patient had his IV removed.  Impression:  The patient tolerated his infusion relatively well.  Plan:  The patient will return in one month for ongoing care.     -----------------------------------  Lynwood CHARM Nasuti, PhD, MD

## 2024-09-15 ENCOUNTER — Telehealth: Payer: Self-pay | Admitting: *Deleted

## 2024-09-15 ENCOUNTER — Other Ambulatory Visit: Payer: Self-pay

## 2024-09-15 ENCOUNTER — Other Ambulatory Visit: Payer: Self-pay | Admitting: *Deleted

## 2024-09-15 DIAGNOSIS — Z803 Family history of malignant neoplasm of breast: Secondary | ICD-10-CM

## 2024-09-15 DIAGNOSIS — C61 Malignant neoplasm of prostate: Secondary | ICD-10-CM

## 2024-09-15 DIAGNOSIS — Z1379 Encounter for other screening for genetic and chromosomal anomalies: Secondary | ICD-10-CM

## 2024-09-15 DIAGNOSIS — C7951 Secondary malignant neoplasm of bone: Secondary | ICD-10-CM

## 2024-09-15 DIAGNOSIS — Z8042 Family history of malignant neoplasm of prostate: Secondary | ICD-10-CM

## 2024-09-15 NOTE — Telephone Encounter (Signed)
 CALLED PATIENT TO INFORM OF LAB AND WEIGHT APPT. ON 10-05-24 @ 12 PM @ [facility], AND HIS XOFIGO  INJECTION ON 10-12-24- ARRIVAL TIME- 11:45 AM @ [facility], SPOKE WITH PATIENT AND HE IS AWARE OF THESE APPTS. AND THE INSTRUCTIONS

## 2024-09-16 ENCOUNTER — Encounter: Payer: Self-pay | Admitting: Hematology & Oncology

## 2024-09-16 ENCOUNTER — Inpatient Hospital Stay: Attending: Hematology & Oncology

## 2024-09-16 ENCOUNTER — Inpatient Hospital Stay

## 2024-09-16 ENCOUNTER — Encounter: Payer: Self-pay | Admitting: *Deleted

## 2024-09-16 ENCOUNTER — Other Ambulatory Visit: Payer: Self-pay

## 2024-09-16 ENCOUNTER — Inpatient Hospital Stay: Admitting: Hematology & Oncology

## 2024-09-16 VITALS — BP 134/44 | HR 61

## 2024-09-16 VITALS — BP 153/68 | HR 55 | Temp 98.4°F | Resp 18 | Ht 72.0 in | Wt 222.0 lb

## 2024-09-16 DIAGNOSIS — E895 Postprocedural testicular hypofunction: Secondary | ICD-10-CM | POA: Insufficient documentation

## 2024-09-16 DIAGNOSIS — Z803 Family history of malignant neoplasm of breast: Secondary | ICD-10-CM

## 2024-09-16 DIAGNOSIS — C7951 Secondary malignant neoplasm of bone: Secondary | ICD-10-CM | POA: Insufficient documentation

## 2024-09-16 DIAGNOSIS — C61 Malignant neoplasm of prostate: Secondary | ICD-10-CM | POA: Diagnosis not present

## 2024-09-16 DIAGNOSIS — Z79818 Long term (current) use of other agents affecting estrogen receptors and estrogen levels: Secondary | ICD-10-CM | POA: Diagnosis not present

## 2024-09-16 DIAGNOSIS — Z1379 Encounter for other screening for genetic and chromosomal anomalies: Secondary | ICD-10-CM

## 2024-09-16 DIAGNOSIS — Z79899 Other long term (current) drug therapy: Secondary | ICD-10-CM | POA: Diagnosis not present

## 2024-09-16 DIAGNOSIS — Z8042 Family history of malignant neoplasm of prostate: Secondary | ICD-10-CM

## 2024-09-16 LAB — CMP (CANCER CENTER ONLY)
ALT: 32 U/L (ref 0–44)
AST: 42 U/L — ABNORMAL HIGH (ref 15–41)
Albumin: 4.2 g/dL (ref 3.5–5.0)
Alkaline Phosphatase: 55 U/L (ref 38–126)
Anion gap: 12 (ref 5–15)
BUN: 15 mg/dL (ref 8–23)
CO2: 24 mmol/L (ref 22–32)
Calcium: 9.3 mg/dL (ref 8.9–10.3)
Chloride: 107 mmol/L (ref 98–111)
Creatinine: 0.82 mg/dL (ref 0.61–1.24)
GFR, Estimated: >60 mL/min (ref >60)
Glucose, Bld: 132 mg/dL — ABNORMAL HIGH (ref 70–99)
Potassium: 4.5 mmol/L (ref 3.5–5.1)
Sodium: 143 mmol/L (ref 135–145)
Total Bilirubin: 0.7 mg/dL (ref 0.0–1.2)
Total Protein: 6.9 g/dL (ref 6.5–8.1)

## 2024-09-16 LAB — CBC WITH DIFFERENTIAL (CANCER CENTER ONLY)
Abs Immature Granulocytes: 0.01 K/uL (ref 0.00–0.07)
Basophils Absolute: 0 K/uL (ref 0.0–0.1)
Basophils Relative: 0 %
Eosinophils Absolute: 0.2 K/uL (ref 0.0–0.5)
Eosinophils Relative: 3 %
HCT: 40.5 % (ref 39.0–52.0)
Hemoglobin: 13.8 g/dL (ref 13.0–17.0)
Immature Granulocytes: 0 %
Lymphocytes Relative: 13 %
Lymphs Abs: 0.7 K/uL (ref 0.7–4.0)
MCH: 35.6 pg — ABNORMAL HIGH (ref 26.0–34.0)
MCHC: 34.1 g/dL (ref 30.0–36.0)
MCV: 104.4 fL — ABNORMAL HIGH (ref 80.0–100.0)
Monocytes Absolute: 0.5 K/uL (ref 0.1–1.0)
Monocytes Relative: 11 %
Neutro Abs: 3.6 K/uL (ref 1.7–7.7)
Neutrophils Relative %: 73 %
Platelet Count: 153 K/uL (ref 150–400)
RBC: 3.88 MIL/uL — ABNORMAL LOW (ref 4.22–5.81)
RDW: 13.1 % (ref 11.5–15.5)
WBC Count: 5 K/uL (ref 4.0–10.5)
nRBC: 0 % (ref 0.0–0.2)

## 2024-09-16 MED ORDER — SODIUM CHLORIDE 0.9 % IV SOLN: INTRAVENOUS | Status: DC

## 2024-09-16 MED ORDER — ZOLEDRONIC ACID 4 MG/100ML IV SOLN
4.0000 mg | Freq: Once | INTRAVENOUS | Status: AC
  Administered 2024-09-16: 4 mg via INTRAVENOUS
  Filled 2024-09-16: qty 100

## 2024-09-16 NOTE — Progress Notes (Unsigned)
 Next Xofigo  scheduled for 10/12/2024. (four of six)  Oncology Nurse Navigator Documentation     09/16/2024   10:45 AM  Oncology Nurse Navigator Flowsheets  Navigator Follow Up Date: 11/11/2024  Navigator Follow Up Reason: Follow-up Appointment  Navigator Location [facility]  Navigator Encounter Type Follow-up Appt  Patient Visit Type MedOnc  Treatment Phase Active Tx  Barriers/Navigation Needs No Barriers At This Time  Interventions None Required  Acuity Level 1-No Barriers  Support Groups/Services Friends and Family  Time Spent with Patient 15

## 2024-09-16 NOTE — Patient Instructions (Signed)

## 2024-09-16 NOTE — Progress Notes (Signed)
 Hematology and Oncology Follow Up Visit  William Phillips [id number] 1951/02/29 73 y.o. 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 twice daily-started on 07/30/2023 Zometa  4 mg IV every 3 months-next on 12/2024   Status post radiotherapy to right/left iliac and L1 Xofigo  -status post 2 cycles    Interim History:  William Phillips is back for follow-up.  He is doing pretty well.  He is now getting Xofigo  from Radiation Oncology.  He feels this is helping.  He has had no problems with the Xofigo .  He is on the Nubeqa .  He is doing well with the Nubeqa .  He gets his Eligard  from Urology.  His last PSA, back in November, was less than 0.1.  His last testosterone  level was 11.  He has had no cough or shortness of breath.  He has had sinus issues.  He is going to see his family doctor about this.  He had to go to the emergency room for this.  He had scans done which showed acute sinusitis.  There is no evidence of metastatic disease to the bones.  It sounds like he may need to ultimately go to ENT.  He has had no problems with nausea or vomiting.  He has had no rashes.  He may have a little bit of leg swelling.  There is been no problems with dysphagia or odynophagia.  He has had some headaches.  I suspect this probably is from the sinus issue.   He never needed to have cardioversion for the atrial fibrillation.  He apparently converted on his own.  He continues on Eliquis .  Overall, I think his performance status is probably ECOG 1.    Medications:  Current Outpatient Medications:    Accu-Chek Softclix Lancets lancets, Use as instructed, Disp: 100 each, Rfl: 12   Blood Glucose Monitoring Suppl DEVI, 1 each by Does not apply route in the morning, at noon, and at bedtime. May substitute to any manufacturer covered by patient's  insurance., Disp: 1 each, Rfl: 0   Brimonidine Tartrate (LUMIFY) 0.025 % SOLN, Place 1 drop into both eyes every morning., Disp: , Rfl:    Calcium  Carbonate-Vit D-Min (CALCIUM  1200 PO), Take 1,200 mg by mouth daily., Disp: , Rfl:    Carboxymethylcellulose Sodium (LUBRICANT EYE DROPS OP), Place 1 drop into both eyes 2 (two) times daily as needed (Dry eye)., Disp: , Rfl:    darolutamide  (NUBEQA ) 300 MG tablet, Take 2 tablets (600 mg total) by mouth 2 (two) times daily with a meal., Disp: 120 tablet, Rfl: 3   ELIQUIS  5 MG TABS tablet, TAKE 1 TABLET BY MOUTH TWICE  DAILY, Disp: 200 tablet, Rfl: 2   fexofenadine  (ALLEGRA ) 180 MG tablet, Take 1 tablet (180 mg total) by mouth daily. (Patient taking differently: Take 180 mg by mouth daily as needed for allergies.), Disp: , Rfl:    fluticasone  (FLONASE ) 50 MCG/ACT nasal spray, Place 2 sprays into both nostrils daily as needed for allergies or rhinitis., Disp: , Rfl: 2   glimepiride  (AMARYL ) 2 MG tablet, TAKE 1 TABLET BY MOUTH DAILY WITH BREAKFAST, Disp: 30 tablet, Rfl: 5   glucose blood test strip, Use as instructed, Disp: 100 each, Rfl: 12   lisinopril  (ZESTRIL ) 10 MG tablet, Take 1 tablet (10 mg total) by mouth daily., Disp: 90 tablet, Rfl: 3   metoprolol  succinate (TOPROL -XL) 100  MG 24 hr tablet, Take 1 tablet (100 mg total) by mouth 2 (two) times daily. Take with or immediately following a meal., Disp: 180 tablet, Rfl: 3   Multiple Vitamin (MULTIVITAMIN) tablet, Take 1 tablet by mouth every evening., Disp: , Rfl:    omeprazole  (PRILOSEC) 20 MG capsule, Take 1 capsule (20 mg total) by mouth daily., Disp: 90 capsule, Rfl: 1   ondansetron  (ZOFRAN ) 4 MG tablet, Take 1 tablet (4 mg total) by mouth every 8 (eight) hours as needed for nausea or vomiting., Disp: 40 tablet, Rfl: 3   oxyCODONE -acetaminophen  (PERCOCET/ROXICET) 5-325 MG tablet, Take 1 tablet by mouth every 8 (eight) hours as needed for severe pain (pain score 7-10) or moderate pain (pain score 4-6).,  Disp: 60 tablet, Rfl: 0   prochlorperazine  (COMPAZINE ) 5 MG tablet, Take 1 tablet (5 mg total) by mouth every 6 (six) hours as needed (headache)., Disp: 15 tablet, Rfl: 0   pseudoephedrine  (SUDAFED) 60 MG tablet, Take 1 tablet (60 mg total) by mouth every 6 (six) hours as needed for congestion., Disp: 20 tablet, Rfl: 0   rosuvastatin  (CRESTOR ) 5 MG tablet, TAKE 1 TABLET (5 MG TOTAL) BY MOUTH DAILY., Disp: 90 tablet, Rfl: 3   sildenafil  (REVATIO ) 20 MG tablet, TAKE 1 TO 4 TABLETS BY MOUTH DAILY AS NEEDED, Disp: 90 tablet, Rfl: 0   silodosin  (RAPAFLO ) 8 MG CAPS capsule, TAKE 1 CAPSULE BY MOUTH DAILY  WITH BREAKFAST, Disp: 100 capsule, Rfl: 2  Current Facility-Administered Medications:    leuprolide  (6 Month) (LUPRON ) injection 45 mg, 45 mg, Intramuscular, Once, Stoneking, Adine PARAS., MD  Allergies:  Allergies  Allergen Reactions   Penicillins Swelling    irriation    Past Medical History, Surgical history, Social history, and Family History were reviewed and updated.  Review of Systems: Review of Systems  Constitutional: Negative.   HENT:  Negative.    Eyes: Negative.   Respiratory: Negative.    Cardiovascular: Negative.   Gastrointestinal: Negative.   Endocrine: Negative.   Musculoskeletal:  Positive for back pain and flank pain.  Neurological: Negative.   Hematological: Negative.   Psychiatric/Behavioral: Negative.      Physical Exam: Vital signs are temperature of 98.4.  Pulse 55.  Blood pressure 153/68.  Weight is 222 pounds.    Wt Readings from Last 3 Encounters:  09/16/24 222 lb (100.7 kg)  09/02/24 220 lb (99.8 kg)  07/29/24 222 lb 6.4 oz (100.9 kg)    Physical Exam Vitals reviewed.  HENT:     Head: Normocephalic and atraumatic.  Eyes:     Pupils: Pupils are equal, round, and reactive to light.  Cardiovascular:     Rate and Rhythm: Normal rate and regular rhythm.     Heart sounds: Normal heart sounds.  Pulmonary:     Effort: Pulmonary effort is normal.      Breath sounds: Normal breath sounds.  Abdominal:     General: Bowel sounds are normal.     Palpations: Abdomen is soft.  Musculoskeletal:        General: No tenderness or deformity. Normal range of motion.     Cervical back: Normal range of motion.  Lymphadenopathy:     Cervical: No cervical adenopathy.  Skin:    General: Skin is warm and dry.     Findings: No erythema or rash.  Neurological:     Mental Status: He is alert and oriented to person, place, and time.  Psychiatric:        Behavior:  Behavior normal.        Thought Content: Thought content normal.        Judgment: Judgment normal.      Lab Results  Component Value Date   WBC 5.0 09/16/2024   HGB 13.8 09/16/2024   HCT 40.5 09/16/2024   MCV 104.4 (H) 09/16/2024   PLT 153 09/16/2024     Chemistry      Component Value Date/Time   NA 143 09/16/2024 1020   NA 139 08/05/2022 1606   K 4.5 09/16/2024 1020   CL 107 09/16/2024 1020   CO2 24 09/16/2024 1020   BUN 15 09/16/2024 1020   BUN 10 08/05/2022 1606   CREATININE 0.82 09/16/2024 1020   CREATININE 0.77 11/29/2014 1512      Component Value Date/Time   CALCIUM  9.3 09/16/2024 1020   ALKPHOS 55 09/16/2024 1020   AST 42 (H) 09/16/2024 1020   ALT 32 09/16/2024 1020   BILITOT 0.7 09/16/2024 1020      Impression and Plan: Mr. Lea is a very nice 73 year old white male.  He has metastatic castrate sensitive prostate cancer.  He is castrate level testosterone .  His PSA has been undetectable.  I would like to hope that the Xofigo  will help him out.  Again, this is all quality of life.  His next Xofigo  is I think on New Year's Eve.  He has a total of 6 treatments.  I probably would not do any scans until after his last treatment is done.  I will plan to see him back sometime in January.    He will get his Zometa  today.   Maude JONELLE Crease, MD 12/5/202511:38 AM

## 2024-09-17 ENCOUNTER — Ambulatory Visit: Payer: Self-pay | Admitting: Hematology & Oncology

## 2024-09-17 LAB — PSA, TOTAL AND FREE
PSA, Free Pct: UNDETERMINED %
PSA, Free: <0.02 ng/mL
Prostate Specific Ag, Serum: <0.1 ng/mL (ref 0.0–4.0)

## 2024-09-17 LAB — TESTOSTERONE: Testosterone: 6 ng/dL — ABNORMAL LOW (ref 264–916)

## 2024-09-19 ENCOUNTER — Encounter: Payer: Self-pay | Admitting: *Deleted

## 2024-09-19 NOTE — Assessment & Plan Note (Addendum)
 Follows with Ortho. Receiving IM steroid injections.  Encouraged moist heat and gentle stretching as tolerated. May try NSAIDs and prescription meds as directed and report if symptoms worsen or seek immediate car

## 2024-09-19 NOTE — Assessment & Plan Note (Signed)
Managed by Urology and Oncology

## 2024-09-19 NOTE — Assessment & Plan Note (Signed)
 Rate controlled. On eliquis . Managed by cardiology.

## 2024-09-19 NOTE — Assessment & Plan Note (Addendum)
 Stable on current medications. Encourage heart healthy diet such as MIND or DASH diet, increase exercise, avoid trans fats, simple carbohydrates and processed foods, consider a krill or fish or flaxseed oil cap daily.

## 2024-09-19 NOTE — Progress Notes (Unsigned)
 Subjective:     Patient ID: William Phillips, male    DOB: [birth year], 73 y.o.   MRN: [id number]  No chief complaint on file.   HPI  Discussed the use of AI scribe software for clinical note transcription with the patient, who gave verbal consent to proceed.  History of Present Illness              Health Maintenance Due  Topic Date Due   FOOT EXAM  Never done   OPHTHALMOLOGY EXAM  10/22/2022   Medicare Annual Wellness (AWV)  10/12/2024    Past Medical History:  Diagnosis Date   Allergic rhinitis    Allergy 1953   Ankle fracture 1998   Asthma 1953   Atrial flutter (HCC)    a. s/p CTI ablation by Dr Kelsie 11/2014   Back pain 08/05/2015   BPH (benign prostatic hyperplasia) 08/26/2015   Cancer (HCC) 0630/2024   Prostate Cancer   Family history of breast cancer    Family history of prostate cancer    Ganglion cyst of dorsum of right wrist 04/08/2017   GERD (gastroesophageal reflux disease)    Hammer toe of right foot 05/06/2013   History of carpal tunnel syndrome    History of radiation therapy    Dr. Lynwood Nasuti 11/16/2023-11/27/2023   Hyperlipidemia    Hypertension    Insect bite 04/23/2015   OA (osteoarthritis) 02/06/2013   knees   Pedal edema 04/08/2017   Preventative health care 04/27/2011   Had normal colonoscopy in 2011    Skin cancer 2020    Past Surgical History:  Procedure Laterality Date   A-FLUTTER ABLATION N/A 09/12/2021   Procedure: A-FLUTTER ABLATION;  Surgeon: Kelsie Lynwood, MD;  Location: [facility];  Service: Cardiovascular;  Laterality: N/A;   ABLATION  11/24/2014   CTI ablation by Dr Kelsie   ANKLE SURGERY     ATRIAL FIBRILLATION ABLATION N/A 09/02/2022   Procedure: ATRIAL FIBRILLATION ABLATION;  Surgeon: Nancey Eulas BRAVO, MD;  Location: [facility];  Service: Cardiovascular;  Laterality: N/A;   ATRIAL FLUTTER ABLATION N/A 11/24/2014   Procedure: ATRIAL FLUTTER ABLATION;  Surgeon: Lynwood Kelsie, MD;  Location: [facility]  CATH LAB;  Service: Cardiovascular;  Laterality: N/A;   CARDIOVERSION N/A 07/14/2022   Procedure: CARDIOVERSION;  Surgeon: Mona Vinie BROCKS, MD;  Location: [facility] ENDOSCOPY;  Service: Cardiovascular;  Laterality: N/A;   Carpel tunnel     COLONOSCOPY  08/04/2002   Normal Exam- Dr Abran   EYE SURGERY  1990   Radial Keratotomy   FRACTURE SURGERY  1998   Broken ankle   HERNIA REPAIR  2008   INGUINAL HERNIA REPAIR  10/13/2006   TONSILLECTOMY      Family History  Problem Relation Age of Onset   Arthritis Mother    Stroke Mother    Coronary artery disease Father        aortic valve disease   Heart attack Father 58   COPD Father    Hypertension Father    Hyperlipidemia Father    Dementia Father    Breast cancer Sister 42   Diabetes Sister    Cancer Sister 55   Diabetes Brother    Prostate cancer Maternal Uncle    Cancer Maternal Uncle    Diabetes type II Maternal Grandfather    Diabetes Maternal Grandfather    Hyperlipidemia Other    Hypertension Other     Social History   Socioeconomic History   Marital  status: Married    Spouse name: Not on file   Number of children: 1   Years of education: Not on file   Highest education level: Master's degree (e.g., MA, MS, MEng, MEd, MSW, MBA)  Occupational History    Comment: Sales  Tobacco Use   Smoking status: Former    Current packs/day: 0.00    Average packs/day: 1 pack/day for 10.0 years (10.0 ttl pk-yrs)    Types: Cigarettes    Start date: 73    Quit date: 1989    Years since quitting: 36.9   Smokeless tobacco: Never   Tobacco comments:    Former smoker 07/29/24  Vaping Use   Vaping status: Never Used  Substance and Sexual Activity   Alcohol use: Yes    Alcohol/week: 10.0 standard drinks of alcohol    Types: 10 Glasses of wine per week    Comment: 1-2 drinks daily (wine) 07/29/24   Drug use: No   Sexual activity: Yes  Other Topics Concern   Not on file  Social History Narrative   Occupation: Airline Pilot   Architect- chemicals, seed, erosion control)   Married- 35 years   21 daughter -holiday representative in   ([place]   Previous smoker - 5-10 pack yrs     Alcohol use-yes (2-3 glasses of wine)     Social Drivers of Corporate Investment Banker Strain: Low Risk  (09/15/2024)   Overall Financial Resource Strain (CARDIA)    Difficulty of Paying Living Expenses: Not hard at all  Food Insecurity: No Food Insecurity (09/15/2024)   Hunger Vital Sign    Worried About Running Out of Food in the Last Year: Never true    Ran Out of Food in the Last Year: Never true  Transportation Needs: No Transportation Needs (09/15/2024)   PRAPARE - Administrator, Civil Service (Medical): No    Lack of Transportation (Non-Medical): No  Physical Activity: Insufficiently Active (09/15/2024)   Exercise Vital Sign    Days of Exercise per Week: 4 days    Minutes of Exercise per Session: 30 min  Stress: No Stress Concern Present (09/15/2024)   [employer] of Occupational Health - Occupational Stress Questionnaire    Feeling of Stress: Not at all  Social Connections: Socially Integrated (09/15/2024)   Social Connection and Isolation Panel    Frequency of Communication with Friends and Family: More than three times a week    Frequency of Social Gatherings with Friends and Family: Twice a week    Attends Religious Services: More than 4 times per year    Active Member of [employer] or Organizations: Yes    Attends Engineer, Structural: More than 4 times per year    Marital Status: Married  Catering Manager Violence: Not At Risk (07/07/2024)   Humiliation, Afraid, Rape, and Kick questionnaire    Fear of Current or Ex-Partner: No    Emotionally Abused: No    Physically Abused: No    Sexually Abused: No    Outpatient Medications Prior to Visit  Medication Sig Dispense Refill   Accu-Chek Softclix Lancets lancets Use as instructed 100 each 12   Blood Glucose Monitoring Suppl DEVI 1 each by Does  not apply route in the morning, at noon, and at bedtime. May substitute to any manufacturer covered by patient's insurance. 1 each 0   Brimonidine Tartrate (LUMIFY) 0.025 % SOLN Place 1 drop into both eyes every morning.     Calcium  Carbonate-Vit D-Min (  CALCIUM  1200 PO) Take 1,200 mg by mouth daily.     Carboxymethylcellulose Sodium (LUBRICANT EYE DROPS OP) Place 1 drop into both eyes 2 (two) times daily as needed (Dry eye).     darolutamide  (NUBEQA ) 300 MG tablet Take 2 tablets (600 mg total) by mouth 2 (two) times daily with a meal. 120 tablet 3   ELIQUIS  5 MG TABS tablet TAKE 1 TABLET BY MOUTH TWICE  DAILY 200 tablet 2   fexofenadine  (ALLEGRA ) 180 MG tablet Take 1 tablet (180 mg total) by mouth daily. (Patient taking differently: Take 180 mg by mouth daily as needed for allergies.)     fluticasone  (FLONASE ) 50 MCG/ACT nasal spray Place 2 sprays into both nostrils daily as needed for allergies or rhinitis.  2   glimepiride  (AMARYL ) 2 MG tablet TAKE 1 TABLET BY MOUTH DAILY WITH BREAKFAST 30 tablet 5   glucose blood test strip Use as instructed 100 each 12   lisinopril  (ZESTRIL ) 10 MG tablet Take 1 tablet (10 mg total) by mouth daily. 90 tablet 3   metoprolol  succinate (TOPROL -XL) 100 MG 24 hr tablet Take 1 tablet (100 mg total) by mouth 2 (two) times daily. Take with or immediately following a meal. 180 tablet 3   Multiple Vitamin (MULTIVITAMIN) tablet Take 1 tablet by mouth every evening.     omeprazole  (PRILOSEC) 20 MG capsule Take 1 capsule (20 mg total) by mouth daily. 90 capsule 1   ondansetron  (ZOFRAN ) 4 MG tablet Take 1 tablet (4 mg total) by mouth every 8 (eight) hours as needed for nausea or vomiting. 40 tablet 3   oxyCODONE -acetaminophen  (PERCOCET/ROXICET) 5-325 MG tablet Take 1 tablet by mouth every 8 (eight) hours as needed for severe pain (pain score 7-10) or moderate pain (pain score 4-6). 60 tablet 0   prochlorperazine  (COMPAZINE ) 5 MG tablet Take 1 tablet (5 mg total) by mouth every  6 (six) hours as needed (headache). 15 tablet 0   pseudoephedrine  (SUDAFED) 60 MG tablet Take 1 tablet (60 mg total) by mouth every 6 (six) hours as needed for congestion. 20 tablet 0   rosuvastatin  (CRESTOR ) 5 MG tablet TAKE 1 TABLET (5 MG TOTAL) BY MOUTH DAILY. 90 tablet 3   sildenafil  (REVATIO ) 20 MG tablet TAKE 1 TO 4 TABLETS BY MOUTH DAILY AS NEEDED 90 tablet 0   silodosin  (RAPAFLO ) 8 MG CAPS capsule TAKE 1 CAPSULE BY MOUTH DAILY  WITH BREAKFAST 100 capsule 2   Facility-Administered Medications Prior to Visit  Medication Dose Route Frequency Provider Last Rate Last Admin   leuprolide  (6 Month) (LUPRON ) injection 45 mg  45 mg Intramuscular Once Stoneking, Adine PARAS., MD        Allergies  Allergen Reactions   Penicillins Swelling    irriation    ROS     Objective:    Physical Exam   There were no vitals taken for this visit. Wt Readings from Last 3 Encounters:  09/16/24 222 lb (100.7 kg)  09/02/24 220 lb (99.8 kg)  07/29/24 222 lb 6.4 oz (100.9 kg)       Assessment & Plan:   Problem List Items Addressed This Visit     Atrial fibrillation (HCC)   Rate controlled. On eliquis . Managed by cardiology.      Back pain   Follows with Ortho. Receiving IM steroid injections.  Encouraged moist heat and gentle stretching as tolerated. May try NSAIDs and prescription meds as directed and report if symptoms worsen or seek immediate car  Controlled type 2 diabetes mellitus without complication, without long-term current use of insulin (HCC)   hgba1c acceptable, minimize simple carbs. Increase exercise as tolerated. Continue current meds       Essential hypertension - Primary   Well controlled, no changes to meds. Encouraged heart healthy diet such as the DASH diet and exercise as tolerated.        GERD   Avoid offending foods, start probiotics. Do not eat large meals in late evening and consider raising head of bed.       Hyperlipidemia, mixed   Stable on current  medications. Encourage heart healthy diet such as MIND or DASH diet, increase exercise, avoid trans fats, simple carbohydrates and processed foods, consider a krill or fish or flaxseed oil cap daily.        Prostate cancer (HCC); PSA 5.3; GG 1,2; unfavorable intermediate risk   Managed by Urology and Oncology.      Metastatic prostate cancer with bone metastases Under Dr. Annis and Dr. Melvern care, Metastatic prostate cancer with bone metastases to the right femur and lower back. Previous radiation therapy was ineffective.  Current Treatment: Eligard  45 mg IM every 6 months  Nubeqa  600 mg p.o. twice daily Zometa  4 mg IV every 3 months        I am having Vinie CHARM Nelwyn [place] maintain his multivitamin, fluticasone , fexofenadine , Lumify, Carboxymethylcellulose Sodium (LUBRICANT EYE DROPS OP), sildenafil , Calcium  Carbonate-Vit D-Min (CALCIUM  1200 PO), ondansetron , Blood Glucose Monitoring Suppl, rosuvastatin , lisinopril , glucose blood, Accu-Chek Softclix Lancets, glimepiride , silodosin , Eliquis , metoprolol  succinate, oxyCODONE -acetaminophen , prochlorperazine , pseudoephedrine , omeprazole , and Nubeqa . We will continue to administer leuprolide  (6 Month).  No orders of the defined types were placed in this encounter.

## 2024-09-19 NOTE — Assessment & Plan Note (Signed)
Avoid offending foods, start probiotics. Do not eat large meals in late evening and consider raising head of bed.  

## 2024-09-19 NOTE — Assessment & Plan Note (Signed)
 Well controlled, no changes to meds. Encouraged heart healthy diet such as the DASH diet and exercise as tolerated.

## 2024-09-19 NOTE — Assessment & Plan Note (Signed)
 hgba1c acceptable, minimize simple carbs. Increase exercise as tolerated. Continue current meds

## 2024-09-20 ENCOUNTER — Encounter: Payer: Self-pay | Admitting: Hematology & Oncology

## 2024-09-21 ENCOUNTER — Encounter: Payer: Self-pay | Admitting: Student

## 2024-09-21 ENCOUNTER — Ambulatory Visit: Admitting: Student

## 2024-09-21 VITALS — BP 147/75 | HR 62 | Ht 72.0 in

## 2024-09-21 DIAGNOSIS — I4819 Other persistent atrial fibrillation: Secondary | ICD-10-CM

## 2024-09-21 DIAGNOSIS — E782 Mixed hyperlipidemia: Secondary | ICD-10-CM

## 2024-09-21 DIAGNOSIS — J320 Chronic maxillary sinusitis: Secondary | ICD-10-CM

## 2024-09-21 DIAGNOSIS — M545 Low back pain, unspecified: Secondary | ICD-10-CM

## 2024-09-21 DIAGNOSIS — C61 Malignant neoplasm of prostate: Secondary | ICD-10-CM

## 2024-09-21 DIAGNOSIS — I10 Essential (primary) hypertension: Secondary | ICD-10-CM

## 2024-09-21 DIAGNOSIS — K219 Gastro-esophageal reflux disease without esophagitis: Secondary | ICD-10-CM

## 2024-09-21 DIAGNOSIS — R519 Headache, unspecified: Secondary | ICD-10-CM | POA: Insufficient documentation

## 2024-09-21 DIAGNOSIS — E119 Type 2 diabetes mellitus without complications: Secondary | ICD-10-CM

## 2024-09-21 MED ORDER — AMOXICILLIN-POT CLAVULANATE 875-125 MG PO TABS: 1.0000 | ORAL_TABLET | Freq: Two times a day (BID) | ORAL | 0 refills | Status: AC

## 2024-09-21 MED ORDER — LISINOPRIL 20 MG PO TABS: 20.0000 mg | ORAL_TABLET | Freq: Every day | ORAL | 3 refills | Status: AC

## 2024-09-21 NOTE — Assessment & Plan Note (Signed)
 Headache has persisted for a few months Differentialsfor HA include- HTN related, chronic sinusitis, OSA Chronic headaches linked to sinusitis and hypertension. CT scan confirmed sinusitis. Differential includes tension and sinus-related headaches. Possible sleep apnea due to waking headaches. - Prescribed Augmentin 875 mg twice daily for 7 days. - Continue Claritin daily. - Use Flonase  daily -May use Tylenol  and Ibuprofen OTC prn - Referred to ENT for sinus evaluation. - Referred to pulmonology for sleep study.

## 2024-09-21 NOTE — Assessment & Plan Note (Signed)
 Waking headaches and fatigue suggest possible OSA. No prior sleep study. - Referred to pulmonology for sleep study.

## 2024-09-22 ENCOUNTER — Encounter (INDEPENDENT_AMBULATORY_CARE_PROVIDER_SITE_OTHER): Payer: Self-pay

## 2024-09-26 ENCOUNTER — Encounter: Payer: Self-pay | Admitting: Family Medicine

## 2024-09-27 ENCOUNTER — Encounter: Payer: Self-pay | Admitting: Family Medicine

## 2024-09-27 LAB — OPHTHALMOLOGY REPORT-SCANNED

## 2024-10-04 ENCOUNTER — Telehealth: Payer: Self-pay | Admitting: *Deleted

## 2024-10-04 NOTE — Telephone Encounter (Signed)
 Called patient to remind of lab and weight appt. for 10-05-24 @ 12 pm @ [facility], lvm for a return call

## 2024-10-05 ENCOUNTER — Ambulatory Visit
Admission: RE | Admit: 2024-10-05 | Discharge: 2024-10-05 | Disposition: A | Discharge status: Home / Self Care | Source: Ambulatory Visit | Attending: Radiation Oncology | Admitting: Radiation Oncology

## 2024-10-05 DIAGNOSIS — C7951 Secondary malignant neoplasm of bone: Secondary | ICD-10-CM | POA: Diagnosis present

## 2024-10-05 LAB — CBC WITH DIFFERENTIAL (CANCER CENTER ONLY)
Abs Immature Granulocytes: 0.02 K/uL (ref 0.00–0.07)
Basophils Absolute: 0 K/uL (ref 0.0–0.1)
Basophils Relative: 1 %
Eosinophils Absolute: 0.2 K/uL (ref 0.0–0.5)
Eosinophils Relative: 5 %
HCT: 37.5 % — ABNORMAL LOW (ref 39.0–52.0)
Hemoglobin: 13.1 g/dL (ref 13.0–17.0)
Immature Granulocytes: 1 %
Lymphocytes Relative: 29 %
Lymphs Abs: 1.3 K/uL (ref 0.7–4.0)
MCH: 35.2 pg — ABNORMAL HIGH (ref 26.0–34.0)
MCHC: 34.9 g/dL (ref 30.0–36.0)
MCV: 100.8 fL — ABNORMAL HIGH (ref 80.0–100.0)
Monocytes Absolute: 0.6 K/uL (ref 0.1–1.0)
Monocytes Relative: 15 %
Neutro Abs: 2.2 K/uL (ref 1.7–7.7)
Neutrophils Relative %: 49 %
Platelet Count: 90 K/uL — ABNORMAL LOW (ref 150–400)
RBC: 3.72 MIL/uL — ABNORMAL LOW (ref 4.22–5.81)
RDW: 12.6 % (ref 11.5–15.5)
WBC Count: 4.4 K/uL (ref 4.0–10.5)
nRBC: 0 % (ref 0.0–0.2)

## 2024-10-07 ENCOUNTER — Telehealth: Payer: Self-pay

## 2024-10-07 ENCOUNTER — Other Ambulatory Visit

## 2024-10-07 ENCOUNTER — Ambulatory Visit

## 2024-10-07 NOTE — Telephone Encounter (Addendum)
 PAP reenrollment for year 2026    Application has been submitted for Nubeqa  through [employer] with both Patient and doctor signatures, along with requested documentation.  Status: Approved through 10/12/2025

## 2024-10-10 ENCOUNTER — Encounter: Payer: Self-pay | Admitting: Urology

## 2024-10-10 ENCOUNTER — Ambulatory Visit: Admitting: Urology

## 2024-10-10 VITALS — BP 163/71 | HR 59 | Ht 72.0 in | Wt 225.0 lb

## 2024-10-10 DIAGNOSIS — C61 Malignant neoplasm of prostate: Secondary | ICD-10-CM | POA: Diagnosis not present

## 2024-10-10 DIAGNOSIS — R338 Other retention of urine: Secondary | ICD-10-CM

## 2024-10-10 DIAGNOSIS — C7951 Secondary malignant neoplasm of bone: Secondary | ICD-10-CM | POA: Diagnosis not present

## 2024-10-10 DIAGNOSIS — R350 Frequency of micturition: Secondary | ICD-10-CM | POA: Diagnosis not present

## 2024-10-10 DIAGNOSIS — R351 Nocturia: Secondary | ICD-10-CM

## 2024-10-10 DIAGNOSIS — Z79818 Long term (current) use of other agents affecting estrogen receptors and estrogen levels: Secondary | ICD-10-CM

## 2024-10-10 DIAGNOSIS — N401 Enlarged prostate with lower urinary tract symptoms: Secondary | ICD-10-CM

## 2024-10-10 DIAGNOSIS — R8290 Unspecified abnormal findings in urine: Secondary | ICD-10-CM | POA: Diagnosis not present

## 2024-10-10 DIAGNOSIS — N138 Other obstructive and reflux uropathy: Secondary | ICD-10-CM | POA: Diagnosis not present

## 2024-10-10 DIAGNOSIS — R339 Retention of urine, unspecified: Secondary | ICD-10-CM

## 2024-10-10 LAB — URINALYSIS, ROUTINE W REFLEX MICROSCOPIC
Glucose, UA: NEGATIVE
Nitrite, UA: POSITIVE — AB
Specific Gravity, UA: 1.02 (ref 1.005–1.030)
Urobilinogen, Ur: 0.2 mg/dL (ref 0.2–1.0)
pH, UA: 6 (ref 5.0–7.5)

## 2024-10-10 LAB — MICROSCOPIC EXAMINATION: WBC, UA: >30 /HPF — AB (ref 0–5)

## 2024-10-10 MED ORDER — LEUPROLIDE ACETATE (6 MONTH) 45 MG ~~LOC~~ KIT
45.0000 mg | PACK | Freq: Once | SUBCUTANEOUS | Status: AC
  Administered 2024-10-10: 45 mg via SUBCUTANEOUS

## 2024-10-10 NOTE — Progress Notes (Signed)
 "  Assessment: 1. Prostate cancer (HCC); PSA 5.3; GG 1,2; unfavorable intermediate risk   2. 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 Encounter for monitoring androgen deprivation therapy   4. BPH with obstruction/lower urinary tract symptoms   5. Incomplete bladder emptying     Plan: Urine culture sent today - will contact him with results Continue silodosin  Continue androgen deprivation therapy with Eligard  6 month depot given today - next dose due after 04/10/25 Continue Nubeqa  per Oncology Daily vit D and calcium  supplement Continue Xofigo  per Radiation Oncology Return to office in 6 months for next Eligard    Chief Complaint:  Chief Complaint  Patient presents with   Prostate Cancer    History of Present Illness:  William Phillips is a 73 y.o. male who is seen for continued evaluation of unfavorable intermediate risk prostate cancer with skeletal metastases, BPH with LUTS, and urinary retention.  He has a history of elevated PSA and BPH with LUTS. PSA results: 1/17 1.63 2/20 2.22 5/21 2.33 9/23 5.41 3/24 4.18 6/24 5.3  No history of UTIs or prostatitis.  No family history of prostate cancer. MRI of the prostate from 05/28/2023 showed a volume of 54.37 cm and PI-RADS 4 lesions in the right mid gland and apex and left apex.  He underwent fusion guided biopsy on 06/23/2023. Prostate volume measured approximately 86 g. Biopsy results: Gleason 3+4, Gleason 3+3 adenocarcinoma in 3/7 cores on left and 6/7 cores on right.  Both regions of interest were positive.  PSMA PET scan from 07/13/2023 showed avid uptake in the prostate with multifocal osseous metastases involving the posterior left iliac, right iliac, and L1 vertebral body. He was started on ADT with firmagon  on 07/23/23. He has been seen by Dr. Timmy with Oncology and started on Nubeqa .  PSA 11/24: 0.48 PSA 12/24: 0.03 PSA 1/25: <0.02 PSA 2/25: <0.02  PSMA PET from 1/25 showed interval resolution  of lesions in the prostate gland, no adenopathy or visceral metastases; several skeletal metastases again demonstrated similar to prior study. He completed radiation therapy to the lumbar spine and bilateral iliac area in February 2025.  At his visit in March 2025, he continued on androgen deprivation therapy for his metastatic prostate cancer.  He reported decreased hot flashes.  He noted fatigue and lack of energy.  He continued on Nubeqa  and started on Zometa .  He continued on daily calcium  and vitamin D  supplementation. He noted some increased urinary symptoms with frequency, urgency, and occasional urge incontinence.  He continued on silodosin .  No dysuria or gross hematuria. IPSS = 22/3.  PSA 5/25:  <0.1 Testosterone  5/25:  11 PSMA PET from 02/25/2024 showed skeletal metastatic disease in similar distribution including the pelvis, spine, ribs, and scapula.  PSA results: 6/25 <0.02 7/25 <0.02 9/25 <0.02 10/25 <0.02 12/25 <0.02  He was started on Xofigo  by radiation oncology in 11/25.  He has a history of lower urinary tract symptoms for 4-5 years.  He had been on tamsulosin  0.4 mg for approximately 5 years.  He continued to have symptoms of frequency, voiding every 2 hours, occasional urgency and urge incontinence, and nocturia 2-5 times per night.  He reported voiding with a good stream.   No dysuria or gross hematuria. IPSS = 18. PVR = 181 ml.  He was given a trial of alfuzosin  10 mg daily at his visit in 3/24.  He reported difficulty voiding while on the medication and was changed to silodosin . He continued on silodosin   with improvement in his urinary symptoms with the medication.  He noted some improvement in his urinary stream as well as decreased nocturia and frequency.  No dysuria or gross hematuria. IPSS = 16.   He developed urinary retention approximately 4-5 days after the biopsy.  He was seen in the emergency room and a Foley catheter was placed with return of approximately  2000 mL.   His foley was removed on 07/06/23.  He returned to the ER that evening unable to void and his foley was replaced.  He presented for a voiding trial on 07/13/23.  He was able to void only a small amount.  He was also found to have paraphimosis which was reduced.   Due to the significant penile edema, his foley was replaced. His Foley catheter was removed and he was started on intermittent catheterization on 07/16/2023. He was continued on CIC and silodosin .  At his visit in November 2024, he was tolerating the androgen deprivation therapy well.  No hot flashes.  He had started on Nubeqa  approximately 2 weeks prior and was tolerating this well.  He was voiding spontaneously.  He had not required intermittent catheterization for approximately 2 weeks.  He reported some frequency and intermittent stream.  No dysuria or gross hematuria. He continued on silodosin . IPSS = 17. Urine culture from 11/24:  >100K Enterococcus.  Treated with Levaquin  x 7 days. Urine culture from 3/25 grew >100 K staph.  Treated with Bactrim . Urine culture 5/25:  no growth  At his visit in June 2025, he continued on ADT and was tolerating this fairly well.  He continued with lower urinary tract symptoms including frequency, urgency and nocturia.  His nocturia was 2-3 times per night.  He reported occasional urinary incontinence. PVR = 170 mL. He was given a trial of Gemtesa 75 mg daily in June 2025.  He returns today for follow-up.  He continues on silodosin  for his lower urinary tract symptoms.  He discontinued the Gemtesa due to problems with urinary retention.  His urinary symptoms are fairly stable.  He continues to have symptoms of frequency, nocturia x 3, and sensation of incomplete emptying.  No dysuria or gross hematuria. IPSS = 14/3. He continues to have some hot flashes with some recent increase.  No weight loss.  He is tolerating the Xofigo  treatments.   Portions of the above documentation were copied  from a prior visit for review purposes only.   Past Medical History:  Past Medical History:  Diagnosis Date   Allergic rhinitis    Allergy 1953   Ankle fracture 1998   Asthma 1953   Atrial flutter (HCC)    a. s/p CTI ablation by Dr Kelsie 11/2014   Back pain 08/05/2015   BPH (benign prostatic hyperplasia) 08/26/2015   Cancer (HCC) 0630/2024   Prostate Cancer   Family history of breast cancer    Family history of prostate cancer    Ganglion cyst of dorsum of right wrist 04/08/2017   GERD (gastroesophageal reflux disease)    Hammer toe of right foot 05/06/2013   History of carpal tunnel syndrome    History of radiation therapy    Dr. Lynwood Nasuti 11/16/2023-11/27/2023   Hyperlipidemia    Hypertension    Insect bite 04/23/2015   OA (osteoarthritis) 02/06/2013   knees   Pedal edema 04/08/2017   Preventative health care 04/27/2011   Had normal colonoscopy in 2011    Skin cancer 2020    Past Surgical History:  Past  Surgical History:  Procedure Laterality Date   A-FLUTTER ABLATION N/A 09/12/2021   Procedure: A-FLUTTER ABLATION;  Surgeon: Kelsie Agent, MD;  Location: [facility] INVASIVE [facility];  Service: Cardiovascular;  Laterality: N/A;   ABLATION  11/24/2014   CTI ablation by Dr Kelsie   ANKLE SURGERY     ATRIAL FIBRILLATION ABLATION N/A 09/02/2022   Procedure: ATRIAL FIBRILLATION ABLATION;  Surgeon: Nancey Eulas BRAVO, MD;  Location: [facility] INVAS[facility] CV [facility];  Service: Cardiovascular;  Laterality: N/A;   ATRIAL FLUTTER ABLATION N/A 11/24/2014   Procedure: ATRIAL FLUTTER ABLATION;  Surgeon: Agent Kelsie, MD;  Location: [facility] CATH LAB;  Service: Cardiovascular;  Laterality: N/A;   CARDIOVERSION N/A 07/14/2022   Procedure: CARDIOVERSION;  Surgeon: Mona Vinie BROCKS, MD;  Location: [facility] ENDOSCOPY;  Service: Cardiovascular;  Laterality: N/A;   Carpel tunnel     COLONOSCOPY  08/04/2002   Normal Exam- Dr Abran   EYE SURGERY  1990   Radial Keratotomy   FRACTURE SURGERY  1998   Broken ankle    HERNIA REPAIR  2008   INGUINAL HERNIA REPAIR  10/13/2006   TONSILLECTOMY      Allergies:  Allergies  Allergen Reactions   Penicillins Swelling    irriation    Family History:  Family History  Problem Relation Age of Onset   Arthritis Mother    Stroke Mother    Coronary artery disease Father        aortic valve disease   Heart attack Father 53   COPD Father    Hypertension Father    Hyperlipidemia Father    Dementia Father    Breast cancer Sister 71   Diabetes Sister    Cancer Sister 82   Diabetes Brother    Prostate cancer Maternal Uncle    Cancer Maternal Uncle    Diabetes type II Maternal Grandfather    Diabetes Maternal Grandfather    Hyperlipidemia Other    Hypertension Other     Social History:  Social History   Tobacco Use   Smoking status: Former    Current packs/day: 0.00    Average packs/day: 1 pack/day for 10.0 years (10.0 ttl pk-yrs)    Types: Cigarettes    Start date: 12    Quit date: 1989    Years since quitting: 37.0   Smokeless tobacco: Never   Tobacco comments:    Former smoker 07/29/24  Vaping Use   Vaping status: Never Used  Substance Use Topics   Alcohol use: Yes    Alcohol/week: 10.0 standard drinks of alcohol    Types: 10 Glasses of wine per week    Comment: 1-2 drinks daily (wine) 07/29/24   Drug use: No    ROS: Constitutional:  Negative for fever, chills, weight loss CV: Negative for chest pain, previous MI, hypertension Respiratory:  Negative for shortness of breath, wheezing, sleep apnea, frequent cough GI:  Negative for nausea, vomiting, bloody stool, GERD  Physical exam: BP (!) 163/71   Pulse (!) 59   Ht 6' (1.829 m)   Wt 225 lb (102.1 kg)   BMI 30.52 kg/m  GENERAL APPEARANCE:  Well appearing, well developed, well nourished, NAD HEENT:  Atraumatic, normocephalic, oropharynx clear NECK:  Supple without lymphadenopathy or thyromegaly ABDOMEN:  Soft, non-tender, no masses EXTREMITIES:  Moves all extremities well,  without clubbing, cyanosis, or edema NEUROLOGIC:  Alert and oriented x 3, normal gait, CN II-XII grossly intact MENTAL STATUS:  appropriate BACK:  Non-tender to palpation, No CVAT SKIN:  Warm, dry,  and intact  Results: U/A: >30 WBCs, 3-10 RBCs, moderate bacteria, nitrite positive "

## 2024-10-10 NOTE — Progress Notes (Signed)
 Eligard  SubQ Injection   Due to Prostate Cancer patient is present today for a Eligard  Injection.  Medication: Eligard  6 month Dose: 45 mg  Location: right upper outer buttocks  NDC: 37064-538-49 Lot: [id number] Exp: 10/2025  Patient tolerated well, no complications were noted  Performed by: JAYSON Mainland CMA  PA approval dates:  Pt approved for eligard  10/03/24 through 10/03/25. Auth # [id number]

## 2024-10-11 ENCOUNTER — Telehealth: Payer: Self-pay | Admitting: *Deleted

## 2024-10-11 NOTE — Telephone Encounter (Signed)
 Called patient to remind of Xofigo  Inj. for 10-12-24- arrival time- 11:45 am - @ [facility], spoke with patient and he is aware of this injection

## 2024-10-12 ENCOUNTER — Encounter (HOSPITAL_COMMUNITY): Admission: RE | Admit: 2024-10-12 | Source: Ambulatory Visit

## 2024-10-12 DIAGNOSIS — C7951 Secondary malignant neoplasm of bone: Secondary | ICD-10-CM | POA: Diagnosis present

## 2024-10-12 MED ORDER — RADIUM RA 223 DICHLORIDE 30 MCCI/ML IV SOLN
150.0000 | Freq: Once | INTRAVENOUS | Status: AC | PRN
  Administered 2024-10-12: 150.66 via INTRAVENOUS

## 2024-10-13 ENCOUNTER — Encounter: Payer: Self-pay | Admitting: Hematology & Oncology

## 2024-10-14 ENCOUNTER — Ambulatory Visit: Payer: Medicare Other

## 2024-10-14 ENCOUNTER — Ambulatory Visit: Payer: Self-pay | Admitting: Urology

## 2024-10-14 DIAGNOSIS — R8290 Unspecified abnormal findings in urine: Secondary | ICD-10-CM

## 2024-10-14 LAB — URINE CULTURE

## 2024-10-14 MED ORDER — SULFAMETHOXAZOLE-TRIMETHOPRIM 800-160 MG PO TABS: 1.0000 | ORAL_TABLET | Freq: Two times a day (BID) | ORAL | 0 refills | Status: AC

## 2024-10-18 ENCOUNTER — Ambulatory Visit: Admitting: *Deleted

## 2024-10-18 VITALS — Ht 72.0 in | Wt 225.0 lb

## 2024-10-18 DIAGNOSIS — Z1211 Encounter for screening for malignant neoplasm of colon: Secondary | ICD-10-CM

## 2024-10-18 DIAGNOSIS — Z0000 Encounter for general adult medical examination without abnormal findings: Secondary | ICD-10-CM

## 2024-10-18 NOTE — Progress Notes (Signed)
" °  Radiation Oncology         [phone] ________________________________  Name: William Phillips MRN: [id number]  Date: 10/12/2024  DOB: [birth year]  Radium-223 Infusion Note  Diagnosis:  Castration resistant prostate cancer with painful bone involvement  Current Infusion:    3  Planned Infusions:  6  Narrative: [name] presented to nuclear medicine for treatment. His most recent blood counts were reviewed.  He remains a good candidate to proceed with Ra-223.  The patient was situated in an infusion suite with a contact barrier placed under his arm. Intravenous access was established, using sterile technique, and a normal saline infusion from a syringe was started.  Micro-dosimetry:  The prescribed radiation activity was assayed and confirmed to be within specified tolerance.  Special Treatment Procedure - Infusion:  The nuclear medicine technologist and I personally verified the dose activity to be delivered as specified in the written directive, and verified the patient identification via 2 separate methods.  The syringe containing the dose was attached to a 3 way stopcock, and then the valve was opened to the patient, and the dose delivered over a minute. No complications were noted.  The total administered dose was 153.1 microcuries in a volume of 9.5 cc.  A saline flush of the line and the syringe that contained the isotope was then performed.  The residual radioactivity in the syringe was 2.44 microcuries, so the actual infused isotope activity was 150.7 microcuries.   Pressure was applied to the venipuncture site, and a compression bandage placed.   Radiation Safety personnel were present to perform the discharge survey, as detailed on their documentation.   After a short period of observation, the patient had his IV removed.  Impression:  The patient tolerated his infusion relatively well.  Plan:  The patient will return in one month for ongoing care.     -----------------------------------  Lynwood CHARM Nasuti, PhD, MD      "

## 2024-10-18 NOTE — Progress Notes (Signed)
" °  Radiation Oncology         [phone] ________________________________  Name: William Phillips MRN: [id number]  Date: 10/12/2024  DOB: [birth year]  Xofigo  Treatment Planning Note:  Diagnosis:  Castration resistant prostate cancer with painful bone involvement  Narrative: William Phillips is a patient who has been diagnosed with castration resistant prostate cancer with painful bone involvement.  His most recent blood counts show that he remains a good candidate to proceed with Ra-223.  The patient is going to receive Xofigo  for his treatment.   Radiation Treatment Planning:  The prescribed radiation activity will be 50 kBq per kg per infusions. The plan is to offer a total of 6 IV administrations of this agent, assuming the blood counts are adequate prior to each administration, with each infusion done at 4 week intervals.  This will be done as an IV administration in the nuclear medicine department, with care to undertake all radiation protection precautions as recommended.    -----------------------------------  Lynwood CHARM Nasuti, PhD, MD  "

## 2024-10-18 NOTE — Patient Instructions (Signed)
 Mr. Milbourne,  Thank you for taking the time for your Medicare Wellness Visit. I appreciate your continued commitment to your health goals. Please review the care plan we discussed, and feel free to reach out if I can assist you further.  Please note that Annual Wellness Visits do not include a physical exam. Some assessments may be limited, especially if the visit was conducted virtually. If needed, we may recommend an in-person follow-up with your provider.  Ongoing Care Seeing your primary care provider every 3 to 6 months helps us  monitor your health and provide consistent, personalized care.   Dr Domenica: 11/17/24 1:20pm Medicare AWV: 10/19/25 9:40am, telephone  Referrals If a referral was made during today's visit and you haven't received any updates within two weeks, please contact the referred provider directly to check on the status.  Cologuard order placed today.  Please let us  know if you have not received your kit in the mail in the next 1-2 weeks.  Recommended Screenings:  You will need to get the following vaccines at your local pharmacy: Covid (if you change your mind).  Health Maintenance  Topic Date Due   Complete foot exam   Never done   Eye exam for diabetics  10/22/2022   Medicare Annual Wellness Visit  10/12/2024   Cologuard (Stool DNA test)  10/16/2024   COVID-19 Vaccine (3 - Moderna risk series) 10/18/2025*   Yearly kidney health urinalysis for diabetes  12/28/2024   Hemoglobin A1C  01/08/2025   DTaP/Tdap/Td vaccine (3 - Td or Tdap) 08/23/2025   Yearly kidney function blood test for diabetes  09/16/2025   Pneumococcal Vaccine for age over 5  Completed   Flu Shot  Completed   Hepatitis C Screening  Completed   Zoster (Shingles) Vaccine  Completed   Meningitis B Vaccine  Aged Out   Colon Cancer Screening  Discontinued  *Topic was postponed. The date shown is not the original due date.       10/18/2024    9:41 AM  Advanced Directives  Does Patient Have a  Medical Advance Directive? Yes  Type of Advance Directive Living will  Does patient want to make changes to medical advance directive? No - Patient declined   Please bring a copy of your health care power of attorney and living will to the office to be added to your chart at your convenience. You can mail a copy to [facility] [address] [place], [place] [address] or email to [email or web address]  Vision: Annual vision screenings are recommended for early detection of glaucoma, cataracts, and diabetic retinopathy. These exams can also reveal signs of chronic conditions such as diabetes and high blood pressure.  Dental: Annual dental screenings help detect early signs of oral cancer, gum disease, and other conditions linked to overall health, including heart disease and diabetes.  Please see the attached documents for additional preventive care recommendations.

## 2024-10-18 NOTE — Progress Notes (Signed)
 "  Please attest this visit in the absence of patient primary care provider.   Chief Complaint  Patient presents with   Medicare Wellness     Subjective:   William Phillips is a 74 y.o. male who presents for a Medicare Annual Wellness Visit.  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 Is followed by oncology and pt will notify them.) Diabetic:: (!) yes Any non-healing wounds?: no How often do you check your BS?: 1 Would you like to be referred to a Nutritionist or for Diabetic Management? : no  Functional Status Activities of Daily Living (to include ambulation/medication): Independent Ambulation: Independent Medication Administration: Independent Home Management (perform basic housework or laundry): Independent Manage your own finances?: yes Primary transportation is: driving Concerns about vision?: no *vision screening is required for WTM* (up to date with [facility], will request most recent copy) Concerns about hearing?: no  Fall Screening Falls in the past  year?: 0 Number of falls in past year: 0 Was there an injury with Fall?: 0 Fall Risk Category Calculator: 0 Patient Fall Risk Level: Low Fall Risk  Fall Risk Patient at Risk for Falls Due to: Orthopedic patient Fall risk Follow up: Falls evaluation completed  Home and Transportation Safety: All rugs have non-skid backing?: N/A, no rugs All stairs or steps have railings?: yes Grab bars in the bathtub or shower?: (!) no (has built in bench) Have non-skid surface in bathtub or shower?: yes Good home lighting?: yes Regular seat belt use?: yes Hospital stays in the last year:: no  Cognitive Assessment Difficulty concentrating, remembering, or making decisions? : no Will 6CIT or Mini Cog be Completed: yes What year is it?: 0 points What month is it?: 0 points Give patient an address phrase to remember (5 components): [address], [place] [employer] About what time is it?: 0 points Count backwards from 20 to 1: 0 points Say the months of the year in reverse: 0 points Repeat the address phrase from earlier: 0 points 6 CIT Score: 0 points  Advance Directives (For Healthcare) Does Patient Have a Medical Advance Directive?: Yes Does patient want to make changes to medical advance directive?: No - Patient declined Type of Advance Directive: Living will Copy of Living Will in Chart?: No - copy requested  Reviewed/Updated  Reviewed/Updated: Reviewed All (Medical, Surgical, Family, Medications, Allergies, Care Teams, Patient Goals)    Allergies (verified) Penicillins   Current Medications (verified) Outpatient Encounter Medications as of 10/18/2024  Medication Sig   Accu-Chek Softclix Lancets lancets Use as instructed   Blood Glucose Monitoring Suppl DEVI 1 each by Does not apply route in the morning, at noon, and at bedtime. May substitute to any manufacturer  covered by patient's insurance.   Brimonidine Tartrate (LUMIFY) 0.025 % SOLN Place 1 drop into both eyes every  morning.   Calcium  Carbonate-Vit D-Min (CALCIUM  1200 PO) Take 1,200 mg by mouth daily.   Carboxymethylcellulose Sodium (LUBRICANT EYE DROPS OP) Place 1 drop into both eyes 2 (two) times daily as needed (Dry eye).   darolutamide  (NUBEQA ) 300 MG tablet Take 2 tablets (600 mg total) by mouth 2 (two) times daily with a meal.   ELIQUIS  5 MG TABS tablet TAKE 1 TABLET BY MOUTH TWICE  DAILY   fexofenadine  (ALLEGRA ) 180 MG tablet Take 1 tablet (180 mg total) by mouth daily. (Patient taking differently: Take 180 mg by mouth daily as needed for allergies.)   fluticasone  (FLONASE ) 50 MCG/ACT nasal spray Place 2 sprays into both nostrils daily as needed for allergies or rhinitis.   glimepiride  (AMARYL ) 2 MG tablet TAKE 1 TABLET BY MOUTH DAILY WITH BREAKFAST   glucose blood test strip Use as instructed   lisinopril  (ZESTRIL ) 20 MG tablet Take 1 tablet (20 mg total) by mouth daily.   metoprolol  succinate (TOPROL -XL) 100 MG 24 hr tablet Take 1 tablet (100 mg total) by mouth 2 (two) times daily. Take with or immediately following a meal.   Multiple Vitamin (MULTIVITAMIN) tablet Take 1 tablet by mouth every evening.   omeprazole  (PRILOSEC) 20 MG capsule Take 1 capsule (20 mg total) by mouth daily.   ondansetron  (ZOFRAN ) 4 MG tablet Take 1 tablet (4 mg total) by mouth every 8 (eight) hours as needed for nausea or vomiting.   oxyCODONE -acetaminophen  (PERCOCET/ROXICET) 5-325 MG tablet Take 1 tablet by mouth every 8 (eight) hours as needed for severe pain (pain score 7-10) or moderate pain (pain score 4-6).   prochlorperazine  (COMPAZINE ) 5 MG tablet Take 1 tablet (5 mg total) by mouth every 6 (six) hours as needed (headache).   rosuvastatin  (CRESTOR ) 5 MG tablet TAKE 1 TABLET (5 MG TOTAL) BY MOUTH DAILY.   sildenafil  (REVATIO ) 20 MG tablet TAKE 1 TO 4 TABLETS BY MOUTH DAILY AS NEEDED   silodosin  (RAPAFLO ) 8 MG CAPS capsule TAKE 1 CAPSULE BY MOUTH DAILY  WITH BREAKFAST   sulfamethoxazole -trimethoprim  (BACTRIM  DS)  800-160 MG tablet Take 1 tablet by mouth every 12 (twelve) hours.   [DISCONTINUED] pseudoephedrine  (SUDAFED) 60 MG tablet Take 1 tablet (60 mg total) by mouth every 6 (six) hours as needed for congestion.   Facility-Administered Encounter Medications as of 10/18/2024  Medication   leuprolide  (6 Month) (LUPRON ) injection 45 mg    History: Past Medical History:  Diagnosis Date   Allergic rhinitis    Allergy 1953   Ankle fracture 1998   Asthma 1953   Atrial flutter (HCC)    a. s/p CTI ablation by Dr Kelsie 11/2014   Back pain 08/05/2015   BPH (benign prostatic hyperplasia) 08/26/2015   Cancer (HCC) 0630/2024   Prostate Cancer   Family history of breast cancer    Family history of prostate cancer    Ganglion cyst of dorsum of right wrist 04/08/2017   GERD (gastroesophageal reflux disease)    Hammer toe of right foot 05/06/2013   History of carpal tunnel syndrome    History of radiation therapy    Dr. Lynwood Nasuti 11/16/2023-11/27/2023   Hyperlipidemia    Hypertension    Insect bite 04/23/2015   OA (osteoarthritis) 02/06/2013   knees   Pedal edema 04/08/2017   Preventative health care 04/27/2011   Had normal colonoscopy in 2011    Skin  cancer 2020   Past Surgical History:  Procedure Laterality Date   A-FLUTTER ABLATION N/A 09/12/2021   Procedure: A-FLUTTER ABLATION;  Surgeon: Kelsie Agent, MD;  Location: [facility]AS[facility];  Service: Cardiovascular;  Laterality: N/A;   ABLATION  11/24/2014   CTI ablation by Dr Kelsie   ANKLE SURGERY     ATRIAL FIBRILLATION ABLATION N/A 09/02/2022   Procedure: ATRIAL FIBRILLATION ABLATION;  Surgeon: Nancey Eulas BRAVO, MD;  Location: [facility] CV [facility];  Service: Cardiovascular;  Laterality: N/A;   ATRIAL FLUTTER ABLATION N/A 11/24/2014   Procedure: ATRIAL FLUTTER ABLATION;  Surgeon: Agent Kelsie, MD;  [facility] CATH LAB;  Service: Cardiovascular;  Laterality: N/A;   CARDIOVERSION N/A 07/14/2022   Procedure: CARDIOVERSION;  Surgeon:  Mona Vinie BROCKS, MD;  Location: [facility] ENDOSCOPY;  Service: Cardiovascular;  Laterality: N/A;   Carpel tunnel     COLONOSCOPY  08/04/2002   Normal Exam- Dr Abran   EYE SURGERY  1990   Radial Keratotomy   FRACTURE SURGERY  1998   Broken ankle   HERNIA REPAIR  2008   INGUINAL HERNIA REPAIR  10/13/2006   TONSILLECTOMY     Family History  Problem Relation Age of Onset   Arthritis Mother    Stroke Mother    Coronary artery disease Father        aortic valve disease   Heart attack Father 4   COPD Father    Hypertension Father    Hyperlipidemia Father    Dementia Father    Breast cancer Sister 90   Diabetes Sister    Cancer Sister 59   Diabetes Brother    Prostate cancer Maternal Uncle    Cancer Maternal Uncle    Diabetes type II Maternal Grandfather    Diabetes Maternal Grandfather    Hyperlipidemia Other    Hypertension Other    Social History   Occupational History    Comment: Sales  Tobacco Use   Smoking status: Former    Current packs/day: 0.00    Average packs/day: 1 pack/day for 10.0 years (10.0 ttl pk-yrs)    Types: Cigarettes    Start date: 58    Quit date: 1989    Years since quitting: 37.0   Smokeless tobacco: Never   Tobacco comments:    Former smoker 07/29/24  Vaping Use   Vaping status: Never Used  Substance and Sexual Activity   Alcohol use: Yes    Alcohol/week: 10.0 standard drinks of alcohol    Types: 10 Glasses of wine per week    Comment: 1-2 drinks daily (wine) 07/29/24   Drug use: No   Sexual activity: Yes   Tobacco Counseling Counseling given: Not Answered Tobacco comments: Former smoker 07/29/24  SDOH Screenings   Food Insecurity: No Food Insecurity (10/18/2024)  Housing: Low Risk (10/18/2024)  Transportation Needs: No Transportation Needs (09/15/2024)  Utilities: Not At Risk (07/07/2024)  Alcohol Screen: Low Risk (09/15/2024)  Depression (PHQ2-9): Low Risk (10/18/2024)  Financial Resource Strain: Low Risk (09/15/2024)  Physical Activity:  Insufficiently Active (10/18/2024)  Social Connections: Socially Integrated (10/18/2024)  Stress: No Stress Concern Present (10/18/2024)  Tobacco Use: Medium Risk (10/18/2024)  Health Literacy: Adequate Health Literacy (10/13/2023)   See flowsheets for full screening details  Depression Screen PHQ 2 & 9 Depression Scale- Over the past 2 weeks, how often have you been bothered by any of the following problems? Little interest or pleasure in doing things: 0 Feeling down, depressed, or hopeless (PHQ Adolescent also includes...irritable): 0  PHQ-2 Total Score: 0 Trouble falling or staying asleep, or sleeping too much: 0 Feeling tired or having little energy: 1 (injections cause fatigue for a few days) Poor appetite or overeating (PHQ Adolescent also includes...weight loss): 0 Feeling bad about yourself - or that you are a failure or have let yourself or your family down: 0 Trouble concentrating on things, such as reading the newspaper or watching television (PHQ Adolescent also includes...like school work): 0 Moving or speaking so slowly that other people could have noticed. Or the opposite - being so fidgety or restless that you have been moving around a lot more than usual: 0 Thoughts that you would be better off dead, or of hurting yourself in some way: 0 PHQ-9 Total Score: 1 If you checked off any problems, how difficult have these problems made it for you to do your work, take care of things at home, or get along with other people?: Not difficult at all     Goals Addressed   None          Objective:    Today's Vitals   10/18/24 0938  Weight: 225 lb (102.1 kg)  Height: 6' (1.829 m)   Body mass index is 30.52 kg/m.  Hearing/Vision screen No results found. Immunizations and Health Maintenance Health Maintenance  Topic Date Due   FOOT EXAM  Never done   OPHTHALMOLOGY EXAM  10/22/2022   Fecal DNA (Cologuard)  10/16/2024   COVID-19 Vaccine (3 - Moderna risk series) 10/18/2025  (Originally 11/20/2020)   Diabetic kidney evaluation - Urine ACR  12/28/2024   HEMOGLOBIN A1C  01/08/2025   DTaP/Tdap/Td (3 - Td or Tdap) 08/23/2025   Diabetic kidney evaluation - eGFR measurement  09/16/2025   Medicare Annual Wellness (AWV)  10/18/2025   Pneumococcal Vaccine: 50+ Years  Completed   Influenza Vaccine  Completed   Hepatitis C Screening  Completed   Zoster Vaccines- Shingrix  Completed   Meningococcal B Vaccine  Aged Out   Colonoscopy  Discontinued        Assessment/Plan:  This is a routine wellness examination for William Phillips.  Patient Care Team: Domenica Harlene LABOR, MD as PCP - General (Family Medicine) Mealor, Eulas BRAVO, MD as PCP - Electrophysiology (Cardiology) Pietro Redell RAMAN, MD as PCP - Cardiology (Cardiology) Timmy Maude SAUNDERS, MD as Medical Oncologist (Oncology) Randine Warren BRAVO, RN as Oncology Nurse Navigator Patrcia Sharper, MD as Consulting Physician (Ophthalmology) Ortho, [facility] (Specialist) Roseann, Adine PARAS., MD as Referring Physician (Urology)  I have personally reviewed and noted the following in the patients chart:   Medical and social history Use of alcohol, tobacco or illicit drugs  Current medications and supplements including opioid prescriptions. Functional ability and status Nutritional status Physical activity Advanced directives List of other physicians Hospitalizations, surgeries, and ER visits in previous 12 months Vitals Screenings to include cognitive, depression, and falls Referrals and appointments  No orders of the defined types were placed in this encounter.  In addition, I have reviewed and discussed with patient certain preventive protocols, quality metrics, and best practice recommendations. A written personalized care plan for preventive services as well as general preventive health recommendations were provided to patient.   Lolita Libra, CMA   10/18/2024   Return in 1 year (on 10/18/2025).  After Visit Summary:  (MyChart) Due to this being a telephonic visit, the after visit summary with patients personalized plan was offered to patient via MyChart   Nurse Notes: HM Addressed: Cologuard Ordered Pt declines covid vaccine.  Requested copy of most recent DM eye exam.  "

## 2024-10-19 ENCOUNTER — Encounter (INDEPENDENT_AMBULATORY_CARE_PROVIDER_SITE_OTHER): Payer: Self-pay | Admitting: Physician Assistant

## 2024-10-19 ENCOUNTER — Ambulatory Visit (INDEPENDENT_AMBULATORY_CARE_PROVIDER_SITE_OTHER): Admitting: Physician Assistant

## 2024-10-19 VITALS — BP 133/66 | HR 69 | Temp 99.1°F | Ht 72.0 in | Wt 225.0 lb

## 2024-10-19 DIAGNOSIS — J309 Allergic rhinitis, unspecified: Secondary | ICD-10-CM

## 2024-10-19 DIAGNOSIS — J329 Chronic sinusitis, unspecified: Secondary | ICD-10-CM | POA: Diagnosis not present

## 2024-10-19 DIAGNOSIS — J324 Chronic pansinusitis: Secondary | ICD-10-CM

## 2024-10-19 MED ORDER — LORATADINE 10 MG PO TABS: 10.0000 mg | ORAL_TABLET | Freq: Every day | ORAL | 11 refills | Status: AC

## 2024-10-19 MED ORDER — FLUTICASONE PROPIONATE 50 MCG/ACT NA SUSP: 2.0000 | Freq: Every day | NASAL | 6 refills | Status: AC

## 2024-10-19 NOTE — Progress Notes (Signed)
 "  Please attest this visit in the absence of patient primary care provider.   Chief Complaint  Patient presents with   Medicare Wellness     Subjective:   William Phillips is a 74 y.o. male who presents for a Medicare Annual Wellness Visit.  Visit info / Clinical Intake: Medicare Wellness Visit Type:: Subsequent Annual Wellness Visit Persons participating in visit and providing information:: patient Medicare Wellness Visit Mode:: Telephone If telephone:: video declined Since this visit was completed virtually, some vitals may be partially provided or unavailable. Missing vitals are due to the limitations of the virtual format.: Unable to obtain vitals - no equipment If Telephone or Video please confirm:: I connected with patient using audio/video enable telemedicine. I verified patient identity with two identifiers, discussed telehealth limitations, and patient agreed to proceed. 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 Is followed by oncology and pt will notify them.) Diabetic:: (!) yes Any non-healing wounds?: no How often do you check your BS?: 1 Would you like to be referred to a Nutritionist or for Diabetic Management? : no  Functional  Status Activities of Daily Living (to include ambulation/medication): Independent Ambulation: Independent Medication Administration: Independent Home Management (perform basic housework or laundry): Independent Manage your own finances?: yes Primary transportation is: driving Concerns about vision?: no *vision screening is required for WTM* (up to date with [facility], will request most recent copy) Concerns about hearing?: no  Fall Screening Falls in the past year?: 0 Number of falls in past year: 0 Was there an injury with Fall?: 0 Fall Risk Category Calculator: 0 Patient Fall Risk Level: Low Fall Risk  Fall Risk Patient at Risk for Falls Due to: Orthopedic patient Fall risk Follow up: Falls evaluation completed  Home and Transportation Safety: All rugs have non-skid backing?: N/A, no rugs All stairs or steps have railings?: yes Grab bars in the bathtub or shower?: (!) no (has built in bench) Have non-skid surface in bathtub or shower?: yes Good home lighting?: yes Regular seat belt use?: yes Hospital stays in the last year:: no  Cognitive Assessment Difficulty concentrating, remembering, or making decisions? : no Will 6CIT or Mini Cog be Completed: yes What year is it?: 0 points What month is it?: 0 points Give patient an address phrase to remember (5 components): [address] [place] About what time is it?: 0 points Count backwards from 20 to 1: 0 points Say the months of the year in reverse: 0 points Repeat the address phrase from earlier: 0 points 6 CIT Score: 0 points  Advance Directives (For Healthcare) Does Patient Have a Medical Advance Directive?: Yes Does patient want to make changes to medical advance directive?: No - Patient declined Type of Advance Directive: Living will Copy of Living Will in Chart?: No - copy requested  Reviewed/Updated  Reviewed/Updated: Reviewed All (Medical, Surgical, Family, Medications,  Allergies, Care Teams, Patient Goals)    Allergies (verified) Penicillins   Current Medications (verified) Outpatient Encounter Medications as of 10/18/2024  Medication Sig   Accu-Chek Softclix Lancets lancets Use as instructed   Blood Glucose Monitoring Suppl DEVI 1 each by Does not apply route in the morning, at noon, and at bedtime. May substitute to any manufacturer covered by patient's insurance.   Brimonidine Tartrate (LUMIFY) 0.025 % SOLN Place 1 drop into both eyes every morning.   Calcium  Carbonate-Vit D-Min (CALCIUM  1200 PO) Take 1,200 mg by mouth daily.   Carboxymethylcellulose Sodium (LUBRICANT EYE DROPS OP) Place 1 drop into both eyes 2 (two) times daily as needed (Dry eye).   darolutamide  (NUBEQA ) 300 MG tablet Take 2 tablets (600 mg total) by mouth 2 (two) times daily with a meal.   ELIQUIS  5 MG TABS tablet TAKE 1 TABLET BY MOUTH TWICE  DAILY   fexofenadine  (ALLEGRA ) 180 MG tablet Take 1 tablet (180 mg total) by mouth daily. (Patient taking differently: Take 180 mg by mouth daily as needed for allergies.)   fluticasone  (FLONASE ) 50 MCG/ACT nasal spray Place 2 sprays into both nostrils daily as needed for allergies or rhinitis.   glimepiride  (AMARYL ) 2 MG tablet TAKE 1 TABLET BY MOUTH DAILY WITH BREAKFAST   glucose blood test strip Use as instructed   lisinopril  (ZESTRIL ) 20 MG tablet Take 1 tablet (20 mg total) by mouth daily.   metoprolol  succinate (TOPROL -XL) 100 MG 24 hr tablet Take 1 tablet (100 mg total) by mouth 2 (two) times daily. Take with or immediately following a meal.   Multiple Vitamin (MULTIVITAMIN) tablet Take 1 tablet by mouth every evening.   omeprazole  (PRILOSEC) 20 MG capsule Take 1 capsule (20 mg total) by mouth daily.   ondansetron  (ZOFRAN ) 4 MG tablet Take 1 tablet (4 mg total) by mouth every 8 (eight) hours as needed for nausea or vomiting.   oxyCODONE -acetaminophen  (PERCOCET/ROXICET) 5-325 MG tablet Take 1 tablet by mouth every 8 (eight) hours as needed  for severe pain (pain score 7-10) or moderate pain (pain score 4-6).   prochlorperazine  (COMPAZINE ) 5 MG tablet Take 1 tablet (5 mg total) by mouth every 6 (six) hours as needed (headache).   rosuvastatin  (CRESTOR ) 5 MG tablet TAKE 1 TABLET (5 MG TOTAL) BY MOUTH DAILY.   sildenafil  (REVATIO ) 20 MG tablet TAKE 1 TO 4 TABLETS BY MOUTH DAILY AS NEEDED   silodosin  (RAPAFLO ) 8 MG CAPS capsule TAKE 1 CAPSULE BY MOUTH DAILY  WITH BREAKFAST   sulfamethoxazole -trimethoprim  (BACTRIM  DS) 800-160 MG tablet Take 1 tablet by mouth every 12 (twelve) hours.   [DISCONTINUED] pseudoephedrine  (SUDAFED) 60 MG tablet Take 1 tablet (60 mg total) by mouth every 6 (six) hours as needed for congestion.   Facility-Administered Encounter Medications as of 10/18/2024  Medication   leuprolide  (6 Month) (LUPRON ) injection 45 mg    History: Past Medical History:  Diagnosis Date   Allergic rhinitis    Allergy 1953   Ankle fracture 1998   Asthma 1953   Atrial flutter (HCC)    a. s/p CTI ablation by Dr Kelsie 11/2014   Back pain 08/05/2015   BPH (benign prostatic hyperplasia) 08/26/2015   Cancer (HCC) 0630/2024   Prostate Cancer   Family history of breast cancer    Family history of prostate cancer    Ganglion cyst of dorsum of right wrist 04/08/2017   GERD (gastroesophageal reflux disease)    Hammer toe  of right foot 05/06/2013   History of carpal tunnel syndrome    History of radiation therapy    Dr. Lynwood Nasuti 11/16/2023-11/27/2023   Hyperlipidemia    Hypertension    Insect bite 04/23/2015   OA (osteoarthritis) 02/06/2013   knees   Pedal edema 04/08/2017   Preventative health care 04/27/2011   Had normal colonoscopy in 2011    Skin cancer 2020   Past Surgical History:  Procedure Laterality Date   A-FLUTTER ABLATION N/A 09/12/2021   Procedure: A-FLUTTER ABLATION;  Surgeon: Kelsie Lynwood, MD;  Location: [facility] INVASIVE [facility];  Service: Cardiovascular;  Laterality: N/A;   ABLATION  11/24/2014   CTI  ablation by Dr Kelsie   ANKLE SURGERY     ATRIAL FIBRILLATION ABLATION N/A 09/02/2022   Procedure: ATRIAL FIBRILLATION ABLATION;  Surgeon: Nancey Eulas BRAVO, MD;  Location: [facility] INVAS[facility] CV [facility];  Service: Cardiovascular;  Laterality: N/A;   ATRIAL FLUTTER ABLATION N/A 11/24/2014   Procedure: ATRIAL FLUTTER ABLATION;  Surgeon: Lynwood Kelsie, MD;  Location: [facility] CATH LAB;  Service: Cardiovascular;  Laterality: N/A;   CARDIOVERSION N/A 07/14/2022   Procedure: CARDIOVERSION;  Surgeon: Mona Vinie BROCKS, MD;  Location: [facility] ENDOSCOPY;  Service: Cardiovascular;  Laterality: N/A;   Carpel tunnel     COLONOSCOPY  08/04/2002   Normal Exam- Dr Abran   EYE SURGERY  1990   Radial Keratotomy   FRACTURE SURGERY  1998   Broken ankle   HERNIA REPAIR  2008   INGUINAL HERNIA REPAIR  10/13/2006   TONSILLECTOMY     Family History  Problem Relation Age of Onset   Arthritis Mother    Stroke Mother    Coronary artery disease Father        aortic valve disease   Heart attack Father 71   COPD Father    Hypertension Father    Hyperlipidemia Father    Dementia Father    Breast cancer Sister 9   Diabetes Sister    Cancer Sister 33   Diabetes Brother    Prostate cancer Maternal Uncle    Cancer Maternal Uncle    Diabetes type II Maternal Grandfather    Diabetes Maternal Grandfather    Hyperlipidemia Other    Hypertension Other    Social History   Occupational History    Comment: Sales  Tobacco Use   Smoking status: Former    Current packs/day: 0.00    Average packs/day: 1 pack/day for 10.0 years (10.0 ttl pk-yrs)    Types: Cigarettes    Start date: 72    Quit date: 1989    Years since quitting: 37.0   Smokeless tobacco: Never   Tobacco comments:    Former smoker 07/29/24  Vaping Use   Vaping status: Never Used  Substance and Sexual Activity   Alcohol use: Yes    Alcohol/week: 10.0 standard drinks of alcohol    Types: 10 Glasses of wine per week    Comment: 1-2 drinks daily (wine) 07/29/24    Drug use: No   Sexual activity: Yes   Tobacco Counseling Counseling given: Not Answered Tobacco comments: Former smoker 07/29/24  SDOH Screenings   Food Insecurity: No Food Insecurity (10/18/2024)  Housing: Low Risk (10/18/2024)  Transportation Needs: No Transportation Needs (09/15/2024)  Utilities: Not At Risk (07/07/2024)  Alcohol Screen: Low Risk (09/15/2024)  Depression (PHQ2-9): Low Risk (10/18/2024)  Financial Resource Strain: Low Risk (09/15/2024)  Physical Activity: Insufficiently Active (10/18/2024)  Social Connections: Socially Integrated (10/18/2024)  Stress: No Stress  Concern Present (10/18/2024)  Tobacco Use: Medium Risk (10/18/2024)  Health Literacy: Adequate Health Literacy (10/13/2023)   See flowsheets for full screening details  Depression Screen PHQ 2 & 9 Depression Scale- Over the past 2 weeks, how often have you been bothered by any of the following problems? Little interest or pleasure in doing things: 0 Feeling down, depressed, or hopeless (PHQ Adolescent also includes...irritable): 0 PHQ-2 Total Score: 0 Trouble falling or staying asleep, or sleeping too much: 0 Feeling tired or having little energy: 1 (injections cause fatigue for a few days) Poor appetite or overeating (PHQ Adolescent also includes...weight loss): 0 Feeling bad about yourself - or that you are a failure or have let yourself or your family down: 0 Trouble concentrating on things, such as reading the newspaper or watching television (PHQ Adolescent also includes...like school work): 0 Moving or speaking so slowly that other people could have noticed. Or the opposite - being so fidgety or restless that you have been moving around a lot more than usual: 0 Thoughts that you would be better off dead, or of hurting yourself in some way: 0 PHQ-9 Total Score: 1 If you checked off any problems, how difficult have these problems made it for you to do your work, take care of things at home, or get along with other  people?: Not difficult at all     Goals Addressed   None          Objective:    Today's Vitals   10/18/24 0938  Weight: 225 lb (102.1 kg)  Height: 6' (1.829 m)   Body mass index is 30.52 kg/m.  Hearing/Vision screen No results found. Immunizations and Health Maintenance Health Maintenance  Topic Date Due   FOOT EXAM  Never done   OPHTHALMOLOGY EXAM  10/22/2022   Fecal DNA (Cologuard)  10/16/2024   COVID-19 Vaccine (3 - Moderna risk series) 10/18/2025 (Originally 11/20/2020)   Diabetic kidney evaluation - Urine ACR  12/28/2024   HEMOGLOBIN A1C  01/08/2025   DTaP/Tdap/Td (3 - Td or Tdap) 08/23/2025   Diabetic kidney evaluation - eGFR measurement  09/16/2025   Medicare Annual Wellness (AWV)  10/18/2025   Pneumococcal Vaccine: 50+ Years  Completed   Influenza Vaccine  Completed   Hepatitis C Screening  Completed   Zoster Vaccines- Shingrix  Completed   Meningococcal B Vaccine  Aged Out   Colonoscopy  Discontinued        Assessment/Plan:  This is a routine wellness examination for William Phillips.  Patient Care Team: Domenica Harlene LABOR, MD as PCP - General (Family Medicine) Mealor, Eulas BRAVO, MD as PCP - Electrophysiology (Cardiology) Pietro Redell RAMAN, MD as PCP - Cardiology (Cardiology) Timmy Maude SAUNDERS, MD as Medical Oncologist (Oncology) Randine Warren BRAVO, RN as Oncology Nurse Navigator Patrcia Sharper, MD as Consulting Physician (Ophthalmology) Ortho, [facility] (Specialist) Roseann, Adine PARAS., MD as Referring Physician (Urology)  I have personally reviewed and noted the following in the patients chart:   Medical and social history Use of alcohol, tobacco or illicit drugs  Current medications and supplements including opioid prescriptions. Functional ability and status Nutritional status Physical activity Advanced directives List of other physicians Hospitalizations, surgeries, and ER visits in previous 12 months Vitals Screenings to include cognitive, depression,  and falls Referrals and appointments  Orders Placed This Encounter  Procedures   Cologuard    Standing Status:   Future    Expected Date:   10/18/2024    Expiration Date:   10/18/2025  In addition, I have reviewed and discussed with patient certain preventive protocols, quality metrics, and best practice recommendations. A written personalized care plan for preventive services as well as general preventive health recommendations were provided to patient.   Lolita Libra, CMA   10/19/2024   Return in 1 year (on 10/18/2025).  After Visit Summary: (MyChart) Due to this being a telephonic visit, the after visit summary with patients personalized plan was offered to patient via MyChart   Nurse Notes: HM Addressed: Cologuard Ordered Pt declines covid vaccine.  Requested copy of most recent DM eye exam.  "

## 2024-10-19 NOTE — Progress Notes (Signed)
 Dear Dr. Wheeler, Here is my assessment for our mutual patient, William Phillips. Thank you for allowing me the opportunity to care for your patient. Please do not hesitate to contact me should you have any other questions. Sincerely, Chyrl Cohen PA-C  Otolaryngology Clinic Note Referring provider: Dr. Wheeler HPI:  William Phillips is a 74 y.o. male kindly referred by Dr. Wheeler   Discussed the use of AI scribe software for clinical note transcription with the patient, who gave verbal consent to proceed.  History of Present Illness    William Phillips is a 74 year old male with chronic sinusitis and allergic rhinitis who presents for evaluation of persistent right-sided headaches.  He has experienced nearly daily headaches since October 2025. The headaches are described as splitting, primarily localized to the right side of the head and sometimes periocularly on the right, occasionally waking him in the early morning hours. The headaches are intermittent, with periods of remission lasting approximately one week, followed by recurrence for two to three weeks. Acetaminophen  provides partial relief. He denies fever, acute sinus pressure, or pain. He also denies nasal trauma, epistaxis, numbness, tingling, weakness, balance disturbances, hearing loss, or tinnitus.  A recent CT of the head demonstrated chronic sinus disease without evidence of intracranial hemorrhage or mass. He was treated with antibiotics for presumed sinus infection following these findings.  He has chronic nasal congestion and frequent rhinorrhea, with symptoms exacerbated during spring and fall. He has significant seasonal allergies and uses intranasal corticosteroids and oral antihistamines intermittently, which provide good symptom control. He does not use CPAP and has no difficulty with nasal breathing. Sinus infections are infrequent.           Independent Review of Additional Tests or Records:  CT head wo  contrast 09/02/2024  IMPRESSION: 1. No acute intracranial abnormality. 2. Bilateral maxillary and sphenoid sinus mucosal thickening with near-complete opacification of the bilateral ethmoid air cells, compatible with acute on chronic sinusitis.   PMH/Meds/All/SocHx/FamHx/ROS:   Past Medical History:  Diagnosis Date   Allergic rhinitis    Allergy 1953   Ankle fracture 1998   Asthma 1953   Atrial flutter (HCC)    a. s/p CTI ablation by Dr Kelsie 11/2014   Back pain 08/05/2015   BPH (benign prostatic hyperplasia) 08/26/2015   Cancer (HCC) 0630/2024   Prostate Cancer   Family history of breast cancer    Family history of prostate cancer    Ganglion cyst of dorsum of right wrist 04/08/2017   GERD (gastroesophageal reflux disease)    Hammer toe of right foot 05/06/2013   History of carpal tunnel syndrome    History of radiation therapy    Dr. Lynwood Nasuti 11/16/2023-11/27/2023   Hyperlipidemia    Hypertension    Insect bite 04/23/2015   OA (osteoarthritis) 02/06/2013   knees   Pedal edema 04/08/2017   Preventative health care 04/27/2011   Had normal colonoscopy in 2011    Skin cancer 2020     Past Surgical History:  Procedure Laterality Date   A-FLUTTER ABLATION N/A 09/12/2021   Procedure: A-FLUTTER ABLATION;  Surgeon: Kelsie Lynwood, MD;  Location: [facility]AS[facility];  Service: Cardiovascular;  Laterality: N/A;   ABLATION  11/24/2014   CTI ablation by Dr Kelsie   ANKLE SURGERY     ATRIAL FIBRILLATION ABLATION N/A 09/02/2022   Procedure: ATRIAL FIBRILLATION ABLATION;  Surgeon: Nancey Eulas BRAVO, MD;  Location: [facility];  Service: Cardiovascular;  Laterality: N/A;  ATRIAL FLUTTER ABLATION N/A 11/24/2014   Procedure: ATRIAL FLUTTER ABLATION;  Surgeon: Lynwood Rakers, MD;  Location: [facility] CATH LAB;  Service: Cardiovascular;  Laterality: N/A;   CARDIOVERSION N/A 07/14/2022   Procedure: CARDIOVERSION;  Surgeon: Mona Vinie BROCKS, MD;  Location: [facility] ENDOSCOPY;  Service:  Cardiovascular;  Laterality: N/A;   Carpel tunnel     COLONOSCOPY  08/04/2002   Normal Exam- Dr Abran   EYE SURGERY  1990   Radial Keratotomy   FRACTURE SURGERY  1998   Broken ankle   HERNIA REPAIR  2008   INGUINAL HERNIA REPAIR  10/13/2006   TONSILLECTOMY      Family History  Problem Relation Age of Onset   Arthritis Mother    Stroke Mother    Coronary artery disease Father        aortic valve disease   Heart attack Father 2   COPD Father    Hypertension Father    Hyperlipidemia Father    Dementia Father    Breast cancer Sister 56   Diabetes Sister    Cancer Sister 31   Diabetes Brother    Prostate cancer Maternal Uncle    Cancer Maternal Uncle    Diabetes type II Maternal Grandfather    Diabetes Maternal Grandfather    Hyperlipidemia Other    Hypertension Other      Social Connections: Socially Integrated (10/18/2024)   Social Connection and Isolation Panel    Frequency of Communication with Friends and Family: More than three times a week    Frequency of Social Gatherings with Friends and Family: Twice a week    Attends Religious Services: More than 4 times per year    Active Member of [employer] or Organizations: Yes    Attends Engineer, Structural: More than 4 times per year    Marital Status: Married     Current Medications[1]   Physical Exam:   BP 133/66   Pulse 69   Temp 99.1 F (37.3 C)   Ht 6' (1.829 m)   Wt 225 lb (102.1 kg)   SpO2 93%   BMI 30.52 kg/m   Pertinent Findings  CN II-XII grossly intact Bilateral EAC clear and TM intact with well pneumatized middle ear spaces  Anterior rhinoscopy: Septum right deviation; bilateral inferior turbinates with moderate hypertrophy No lesions of oral cavity/oropharynx; dentition WNL No obviously palpable neck masses/lymphadenopathy/thyromegaly No respiratory distress or stridor   Seprately Identifiable Procedures:  None  Impression & Plans:  [name] is a 74 y.o. male with the  following   Assessment and Plan    Chronic sinusitis Radiographic evidence of chronic sinus disease. The patient denies any symptoms or chronic or recurrent sinus infections disease. On exam he does have turbinate hypertrophy. Given the location and characterization of his headache I have very low suspicion his headaches are caused due to sinus disease. I would recommend he continue evaluation by his PCP for ongoing headaches. As far as his sinus diease I would like him to do the following.   - Recommended daily saline irrigation of the sinuses using a neti pot or NeilMed bottle for three months. - Advised follow-up in three months to reassess symptoms and response to conservative management. - Consider dedicated CT of the sinuses if ongoing or worsening sinus symptoms develop. - Provided anticipatory guidance to return sooner if significant sinus pressure, pain, or other acute changes develop.  Allergic rhinitis Seasonal allergies with nasal congestion and turbinate swelling consistent with allergic rhinitis. Allergic  inflammation likely contributes to chronic sinus findings. Ongoing symptoms require daily management. - Prescribed fluticasone  nasal spray for daily use for three months. - Prescribed loratadine  tablets for daily use as an oral antihistamine. - Advised that either Flonase  Sensimist or regular Flonase  is acceptable, with option to switch if dryness occurs. - Recommended continuation of allergy management with daily medications and sinus irrigation.           - f/u 3 months    Thank you for allowing me the opportunity to care for your patient. Please do not hesitate to contact me should you have any other questions.  Sincerely, Chyrl Cohen PA-C [facility] Phone: [phone] Fax: [phone]  10/19/2024, 3:29 PM         [1]  Current Outpatient Medications:    Accu-Chek Softclix Lancets lancets, Use as instructed, Disp: 100 each, Rfl: 12   Blood  Glucose Monitoring Suppl DEVI, 1 each by Does not apply route in the morning, at noon, and at bedtime. May substitute to any manufacturer covered by patient's insurance., Disp: 1 each, Rfl: 0   Brimonidine Tartrate (LUMIFY) 0.025 % SOLN, Place 1 drop into both eyes every morning., Disp: , Rfl:    Calcium  Carbonate-Vit D-Min (CALCIUM  1200 PO), Take 1,200 mg by mouth daily., Disp: , Rfl:    Carboxymethylcellulose Sodium (LUBRICANT EYE DROPS OP), Place 1 drop into both eyes 2 (two) times daily as needed (Dry eye)., Disp: , Rfl:    darolutamide  (NUBEQA ) 300 MG tablet, Take 2 tablets (600 mg total) by mouth 2 (two) times daily with a meal., Disp: 120 tablet, Rfl: 3   ELIQUIS  5 MG TABS tablet, TAKE 1 TABLET BY MOUTH TWICE  DAILY, Disp: 200 tablet, Rfl: 2   fexofenadine  (ALLEGRA ) 180 MG tablet, Take 1 tablet (180 mg total) by mouth daily. (Patient taking differently: Take 180 mg by mouth daily as needed for allergies.), Disp: , Rfl:    fluticasone  (FLONASE ) 50 MCG/ACT nasal spray, Place 2 sprays into both nostrils daily as needed for allergies or rhinitis., Disp: , Rfl: 2   fluticasone  (FLONASE ) 50 MCG/ACT nasal spray, Place 2 sprays into both nostrils daily., Disp: 16 g, Rfl: 6   glimepiride  (AMARYL ) 2 MG tablet, TAKE 1 TABLET BY MOUTH DAILY WITH BREAKFAST, Disp: 30 tablet, Rfl: 5   glucose blood test strip, Use as instructed, Disp: 100 each, Rfl: 12   lisinopril  (ZESTRIL ) 20 MG tablet, Take 1 tablet (20 mg total) by mouth daily., Disp: 90 tablet, Rfl: 3   loratadine  (CLARITIN ) 10 MG tablet, Take 1 tablet (10 mg total) by mouth daily., Disp: 30 tablet, Rfl: 11   metoprolol  succinate (TOPROL -XL) 100 MG 24 hr tablet, Take 1 tablet (100 mg total) by mouth 2 (two) times daily. Take with or immediately following a meal., Disp: 180 tablet, Rfl: 3   Multiple Vitamin (MULTIVITAMIN) tablet, Take 1 tablet by mouth every evening., Disp: , Rfl:    omeprazole  (PRILOSEC) 20 MG capsule, Take 1 capsule (20 mg total) by  mouth daily., Disp: 90 capsule, Rfl: 1   ondansetron  (ZOFRAN ) 4 MG tablet, Take 1 tablet (4 mg total) by mouth every 8 (eight) hours as needed for nausea or vomiting., Disp: 40 tablet, Rfl: 3   oxyCODONE -acetaminophen  (PERCOCET/ROXICET) 5-325 MG tablet, Take 1 tablet by mouth every 8 (eight) hours as needed for severe pain (pain score 7-10) or moderate pain (pain score 4-6)., Disp: 60 tablet, Rfl: 0   prochlorperazine  (COMPAZINE ) 5 MG tablet, Take 1  tablet (5 mg total) by mouth every 6 (six) hours as needed (headache)., Disp: 15 tablet, Rfl: 0   rosuvastatin  (CRESTOR ) 5 MG tablet, TAKE 1 TABLET (5 MG TOTAL) BY MOUTH DAILY., Disp: 90 tablet, Rfl: 3   sildenafil  (REVATIO ) 20 MG tablet, TAKE 1 TO 4 TABLETS BY MOUTH DAILY AS NEEDED, Disp: 90 tablet, Rfl: 0   silodosin  (RAPAFLO ) 8 MG CAPS capsule, TAKE 1 CAPSULE BY MOUTH DAILY  WITH BREAKFAST, Disp: 100 capsule, Rfl: 2   sulfamethoxazole -trimethoprim  (BACTRIM  DS) 800-160 MG tablet, Take 1 tablet by mouth every 12 (twelve) hours., Disp: 14 tablet, Rfl: 0  Current Facility-Administered Medications:    leuprolide  (6 Month) (LUPRON ) injection 45 mg, 45 mg, Intramuscular, Once, [place], Adine PARAS., MD

## 2024-10-21 ENCOUNTER — Other Ambulatory Visit: Payer: Self-pay

## 2024-10-21 ENCOUNTER — Other Ambulatory Visit: Payer: Self-pay | Admitting: Family Medicine

## 2024-10-21 ENCOUNTER — Other Ambulatory Visit (HOSPITAL_COMMUNITY): Payer: Self-pay | Admitting: Radiation Oncology

## 2024-10-21 ENCOUNTER — Telehealth: Payer: Self-pay | Admitting: *Deleted

## 2024-10-21 ENCOUNTER — Encounter: Payer: Self-pay | Admitting: Family Medicine

## 2024-10-21 DIAGNOSIS — Z192 Hormone resistant malignancy status: Secondary | ICD-10-CM

## 2024-10-21 DIAGNOSIS — C61 Malignant neoplasm of prostate: Secondary | ICD-10-CM

## 2024-10-21 DIAGNOSIS — C7951 Secondary malignant neoplasm of bone: Secondary | ICD-10-CM

## 2024-10-21 MED ORDER — OXYCODONE-ACETAMINOPHEN 5-325 MG PO TABS: 1.0000 | ORAL_TABLET | Freq: Three times a day (TID) | ORAL | 0 refills | Status: AC | PRN

## 2024-10-21 NOTE — Telephone Encounter (Signed)
 CALLED PATIENT TO INFORM OF LAB AND WEIGHT APPT. AND XOFIGO  INJ. BEING BOOKED, SPOKE WITH PATIENT AND HE IS AWARE OF THESE APPTS.

## 2024-11-01 ENCOUNTER — Telehealth: Payer: Self-pay | Admitting: *Deleted

## 2024-11-01 NOTE — Telephone Encounter (Signed)
 Called patient to remind of lab and weight for 11-02-24 @ 12 pm @ [facility], spoke with patient and he is aware of this appt.

## 2024-11-02 ENCOUNTER — Ambulatory Visit
Admission: RE | Admit: 2024-11-02 | Discharge: 2024-11-02 | Disposition: A | Discharge status: Home / Self Care | Source: Ambulatory Visit | Attending: Internal Medicine | Admitting: Internal Medicine

## 2024-11-02 DIAGNOSIS — C7951 Secondary malignant neoplasm of bone: Secondary | ICD-10-CM | POA: Diagnosis present

## 2024-11-02 LAB — CBC WITH DIFFERENTIAL (CANCER CENTER ONLY)
Abs Immature Granulocytes: 0.02 K/uL (ref 0.00–0.07)
Basophils Absolute: 0 K/uL (ref 0.0–0.1)
Basophils Relative: 0 %
Eosinophils Absolute: 0.2 K/uL (ref 0.0–0.5)
Eosinophils Relative: 4 %
HCT: 35.2 % — ABNORMAL LOW (ref 39.0–52.0)
Hemoglobin: 12 g/dL — ABNORMAL LOW (ref 13.0–17.0)
Immature Granulocytes: 0 %
Lymphocytes Relative: 24 %
Lymphs Abs: 1.2 K/uL (ref 0.7–4.0)
MCH: 35.7 pg — ABNORMAL HIGH (ref 26.0–34.0)
MCHC: 34.1 g/dL (ref 30.0–36.0)
MCV: 104.8 fL — ABNORMAL HIGH (ref 80.0–100.0)
Monocytes Absolute: 1.1 K/uL — ABNORMAL HIGH (ref 0.1–1.0)
Monocytes Relative: 22 %
Neutro Abs: 2.4 K/uL (ref 1.7–7.7)
Neutrophils Relative %: 50 %
Platelet Count: 126 K/uL — ABNORMAL LOW (ref 150–400)
RBC: 3.36 MIL/uL — ABNORMAL LOW (ref 4.22–5.81)
RDW: 12.7 % (ref 11.5–15.5)
WBC Count: 4.9 K/uL (ref 4.0–10.5)
nRBC: 0 % (ref 0.0–0.2)

## 2024-11-03 ENCOUNTER — Ambulatory Visit

## 2024-11-07 ENCOUNTER — Ambulatory Visit: Admitting: Student

## 2024-11-08 ENCOUNTER — Telehealth: Payer: Self-pay | Admitting: *Deleted

## 2024-11-08 NOTE — Telephone Encounter (Signed)
 CALLED PATIENT TO REMIND OF XOFIGO  INJ. FOR 11-09-24- ARRIVAL TIME - 11:45-AM  @ [facility], SPOKE WITH PATIENT AND HE IS AWARE OF THIS INJECTION

## 2024-11-09 ENCOUNTER — Telehealth: Payer: Self-pay | Admitting: *Deleted

## 2024-11-09 ENCOUNTER — Encounter (HOSPITAL_COMMUNITY): Admission: RE | Admit: 2024-11-09 | Source: Ambulatory Visit

## 2024-11-09 NOTE — Telephone Encounter (Signed)
 CALLED PATIENT TO INFORM THAT MEDICINE HAS NOT ARRIVED FOR XOFIGO  INJ. ON 11-09-24, INJECTION HAS BEEN RESCHEDULED FOR 11/10/24 @ 12 PM IN [facility], SPOKE WITH PATIENT'S WIFE- JOAN AND SHE AND HER HUSBAND ARE AWARE OF THIS AND ARE GOOD WITH THIS

## 2024-11-10 ENCOUNTER — Telehealth: Payer: Self-pay | Admitting: *Deleted

## 2024-11-10 ENCOUNTER — Encounter (HOSPITAL_COMMUNITY)

## 2024-11-10 NOTE — Telephone Encounter (Signed)
 Called patient to inform not to come today for Xofigo  Inj. due to medication not arriving, spoke with wife Cleatis Fandrich and she is aware of this

## 2024-11-11 ENCOUNTER — Encounter (HOSPITAL_COMMUNITY): Payer: Self-pay

## 2024-11-11 ENCOUNTER — Encounter (HOSPITAL_COMMUNITY)

## 2024-11-11 ENCOUNTER — Inpatient Hospital Stay: Admitting: Hematology & Oncology

## 2024-11-11 ENCOUNTER — Encounter: Payer: Self-pay | Admitting: *Deleted

## 2024-11-11 ENCOUNTER — Encounter: Payer: Self-pay | Admitting: Hematology & Oncology

## 2024-11-11 ENCOUNTER — Inpatient Hospital Stay: Attending: Hematology & Oncology

## 2024-11-11 ENCOUNTER — Telehealth: Payer: Self-pay | Admitting: *Deleted

## 2024-11-11 VITALS — BP 154/60 | HR 52 | Temp 98.1°F | Resp 20 | Ht 72.0 in | Wt 228.0 lb

## 2024-11-11 DIAGNOSIS — C61 Malignant neoplasm of prostate: Secondary | ICD-10-CM | POA: Diagnosis not present

## 2024-11-11 DIAGNOSIS — C7951 Secondary malignant neoplasm of bone: Secondary | ICD-10-CM

## 2024-11-11 LAB — CBC WITH DIFFERENTIAL (CANCER CENTER ONLY)
Abs Immature Granulocytes: 0.02 10*3/uL (ref 0.00–0.07)
Basophils Absolute: 0 10*3/uL (ref 0.0–0.1)
Basophils Relative: 1 %
Eosinophils Absolute: 0.2 10*3/uL (ref 0.0–0.5)
Eosinophils Relative: 6 %
HCT: 34.5 % — ABNORMAL LOW (ref 39.0–52.0)
Hemoglobin: 11.4 g/dL — ABNORMAL LOW (ref 13.0–17.0)
Immature Granulocytes: 1 %
Lymphocytes Relative: 26 %
Lymphs Abs: 0.9 10*3/uL (ref 0.7–4.0)
MCH: 35.7 pg — ABNORMAL HIGH (ref 26.0–34.0)
MCHC: 33 g/dL (ref 30.0–36.0)
MCV: 108.2 fL — ABNORMAL HIGH (ref 80.0–100.0)
Monocytes Absolute: 0.6 10*3/uL (ref 0.1–1.0)
Monocytes Relative: 15 %
Neutro Abs: 1.9 10*3/uL (ref 1.7–7.7)
Neutrophils Relative %: 51 %
Platelet Count: 134 10*3/uL — ABNORMAL LOW (ref 150–400)
RBC: 3.19 MIL/uL — ABNORMAL LOW (ref 4.22–5.81)
RDW: 13.1 % (ref 11.5–15.5)
WBC Count: 3.7 10*3/uL — ABNORMAL LOW (ref 4.0–10.5)
nRBC: 0 % (ref 0.0–0.2)

## 2024-11-11 LAB — CMP (CANCER CENTER ONLY)
ALT: 16 U/L (ref 0–44)
AST: 22 U/L (ref 15–41)
Albumin: 4.1 g/dL (ref 3.5–5.0)
Alkaline Phosphatase: 44 U/L (ref 38–126)
Anion gap: 12 (ref 5–15)
BUN: 11 mg/dL (ref 8–23)
CO2: 24 mmol/L (ref 22–32)
Calcium: 9.4 mg/dL (ref 8.9–10.3)
Chloride: 106 mmol/L (ref 98–111)
Creatinine: 0.71 mg/dL (ref 0.61–1.24)
GFR, Estimated: >60 mL/min
Glucose, Bld: 133 mg/dL — ABNORMAL HIGH (ref 70–99)
Potassium: 4.6 mmol/L (ref 3.5–5.1)
Sodium: 142 mmol/L (ref 135–145)
Total Bilirubin: 0.5 mg/dL (ref 0.0–1.2)
Total Protein: 6.8 g/dL (ref 6.5–8.1)

## 2024-11-11 NOTE — Telephone Encounter (Signed)
 RETURNED PATIENT'S PHONE CALL, SPOKE WITH PATIENT. ?

## 2024-11-11 NOTE — Progress Notes (Signed)
 Patient is doing well on his Xofigo . He has one more dose which needs to be scheduled. He has continued skeletal pain. Dr Timmy will order MRI and refer to Neurosurgery.   Per Dr Timmy, referral to Dr Alm Molt ordered. Referral including medical records routed to [phone].  Oncology Nurse Navigator Documentation     11/11/2024    2:00 PM  Oncology Nurse Navigator Flowsheets  Navigator Follow Up Date: 12/23/2024  Navigator Follow Up Reason: Follow-up Appointment  Navigator Location [facility]  Navigator Encounter Type Follow-up Appt  Patient Visit Type MedOnc  Treatment Phase Active Tx  Barriers/Navigation Needs No Barriers At This Time  Interventions Referrals  Acuity Level 1-No Barriers  Referrals Other  Support Groups/Services Friends and Family  Time Spent with Patient 30

## 2024-11-11 NOTE — Progress Notes (Signed)
 " Hematology and Oncology Follow Up Visit  GRYPHON VANDERVEEN [id number] February 06, 1951 74 y.o. 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 twice daily-started on 07/30/2023 Zometa  4 mg IV every 3 months-next on 12/2024   Status post radiotherapy to right/left iliac and L1 Xofigo  -status post 3 cycles    Interim History:  Mr. Austad is back for follow-up.  He is doing pretty well.  He is now getting Xofigo  from Radiation Oncology.  He feels this is helping.  He has had no problems with the Xofigo .  Unfortunately because of the weather, he has not been able to get a fourth cycle yet.  He still has some pain in the right hip and femur.  He was told this might be spinal stenosis.  I will see about making referral to [facility].  I think a have a neighbor who had surgery by one of the neurosurgeons and was very happy with that group.  The PSA is still not detectable.  His testosterone  has been incredibly low.  He continues on the Nubeqa ..  He is gaining weight.  I am not happy about this.  I told him that this may activate his prostate cancer little bit more because of the steroid quality in the adipose tissue.  He has had no nausea or vomiting.  He has had no change in bowel or bladder habits.  He has had no cough or shortness of breath.  Overall, I would say that his performance status is probably ECOG 1.   Medications:  Current Outpatient Medications:    Accu-Chek Softclix Lancets lancets, Use as instructed, Disp: 100 each, Rfl: 12   Blood Glucose Monitoring Suppl DEVI, 1 each by Does not apply route in the morning, at noon, and at bedtime. May substitute to any manufacturer covered by patient's insurance., Disp: 1 each, Rfl: 0   Brimonidine Tartrate (LUMIFY) 0.025 % SOLN, Place 1 drop into both eyes every morning., Disp: , Rfl:     Calcium  Carbonate-Vit D-Min (CALCIUM  1200 PO), Take 1,200 mg by mouth daily., Disp: , Rfl:    Carboxymethylcellulose Sodium (LUBRICANT EYE DROPS OP), Place 1 drop into both eyes 2 (two) times daily as needed (Dry eye)., Disp: , Rfl:    darolutamide  (NUBEQA ) 300 MG tablet, Take 2 tablets (600 mg total) by mouth 2 (two) times daily with a meal., Disp: 120 tablet, Rfl: 3   ELIQUIS  5 MG TABS tablet, TAKE 1 TABLET BY MOUTH TWICE  DAILY, Disp: 200 tablet, Rfl: 2   fexofenadine  (ALLEGRA ) 180 MG tablet, Take 1 tablet (180 mg total) by mouth daily., Disp: , Rfl:    fluticasone  (FLONASE ) 50 MCG/ACT nasal spray, Place 2 sprays into both nostrils daily., Disp: 16 g, Rfl: 6   glimepiride  (AMARYL ) 2 MG tablet, TAKE 1 TABLET BY MOUTH DAILY WITH BREAKFAST, Disp: 30 tablet, Rfl: 5   glucose blood test strip, Use as instructed, Disp: 100 each, Rfl: 12   lisinopril  (ZESTRIL ) 20 MG tablet, Take 1 tablet (20 mg total) by mouth daily., Disp: 90 tablet, Rfl: 3   loratadine  (CLARITIN ) 10 MG tablet, Take 1 tablet (10 mg total) by mouth daily., Disp: 30 tablet, Rfl: 11   metoprolol  succinate (TOPROL -XL) 100 MG 24 hr tablet, Take 1 tablet (100 mg total) by mouth 2 (two) times daily. Take with or immediately following a meal., Disp: 180 tablet,  Rfl: 3   Multiple Vitamin (MULTIVITAMIN) tablet, Take 1 tablet by mouth every evening., Disp: , Rfl:    omeprazole  (PRILOSEC) 20 MG capsule, Take 1 capsule (20 mg total) by mouth daily., Disp: 90 capsule, Rfl: 1   ondansetron  (ZOFRAN ) 4 MG tablet, Take 1 tablet (4 mg total) by mouth every 8 (eight) hours as needed for nausea or vomiting., Disp: 40 tablet, Rfl: 3   oxyCODONE -acetaminophen  (PERCOCET/ROXICET) 5-325 MG tablet, Take 1 tablet by mouth every 8 (eight) hours as needed for severe pain (pain score 7-10) or moderate pain (pain score 4-6)., Disp: 60 tablet, Rfl: 0   rosuvastatin  (CRESTOR ) 5 MG tablet, TAKE 1 TABLET (5 MG TOTAL) BY MOUTH DAILY., Disp: 90 tablet, Rfl: 3    sildenafil  (REVATIO ) 20 MG tablet, TAKE 1 TO 4 TABLETS BY MOUTH DAILY AS NEEDED, Disp: 90 tablet, Rfl: 0   silodosin  (RAPAFLO ) 8 MG CAPS capsule, TAKE 1 CAPSULE BY MOUTH DAILY  WITH BREAKFAST, Disp: 100 capsule, Rfl: 2   sulfamethoxazole -trimethoprim  (BACTRIM  DS) 800-160 MG tablet, Take 1 tablet by mouth every 12 (twelve) hours., Disp: 14 tablet, Rfl: 0  Current Facility-Administered Medications:    leuprolide  (6 Month) (LUPRON ) injection 45 mg, 45 mg, Intramuscular, Once, [place], Adine PARAS., MD  Allergies:  Allergies  Allergen Reactions   Penicillins Swelling    irriation    Past Medical History, Surgical history, Social history, and Family History were reviewed and updated.  Review of Systems: Review of Systems  Constitutional: Negative.   HENT:  Negative.    Eyes: Negative.   Respiratory: Negative.    Cardiovascular: Negative.   Gastrointestinal: Negative.   Endocrine: Negative.   Musculoskeletal:  Positive for back pain and flank pain.  Neurological: Negative.   Hematological: Negative.   Psychiatric/Behavioral: Negative.      Physical Exam: Vital signs are temperature of 98.1.  Pulse 52.  Blood pressure 129/61.  Weight is 228 pounds.    Wt Readings from Last 3 Encounters:  11/11/24 228 lb (103.4 kg)  10/19/24 225 lb (102.1 kg)  10/18/24 225 lb (102.1 kg)    Physical Exam Vitals reviewed.  HENT:     Head: Normocephalic and atraumatic.  Eyes:     Pupils: Pupils are equal, round, and reactive to light.  Cardiovascular:     Rate and Rhythm: Normal rate and regular rhythm.     Heart sounds: Normal heart sounds.  Pulmonary:     Effort: Pulmonary effort is normal.     Breath sounds: Normal breath sounds.  Abdominal:     General: Bowel sounds are normal.     Palpations: Abdomen is soft.  Musculoskeletal:        General: No tenderness or deformity. Normal range of motion.     Cervical back: Normal range of motion.  Lymphadenopathy:     Cervical: No cervical  adenopathy.  Skin:    General: Skin is warm and dry.     Findings: No erythema or rash.  Neurological:     Mental Status: He is alert and oriented to person, place, and time.  Psychiatric:        Behavior: Behavior normal.        Thought Content: Thought content normal.        Judgment: Judgment normal.      Lab Results  Component Value Date   WBC 3.7 (L) 11/11/2024   HGB 11.4 (L) 11/11/2024   HCT 34.5 (L) 11/11/2024   MCV 108.2 (H) 11/11/2024  PLT 134 (L) 11/11/2024     Chemistry      Component Value Date/Time   NA 142 11/11/2024 1034   NA 139 08/05/2022 1606   K 4.6 11/11/2024 1034   CL 106 11/11/2024 1034   CO2 24 11/11/2024 1034   BUN 11 11/11/2024 1034   BUN 10 08/05/2022 1606   CREATININE 0.71 11/11/2024 1034   CREATININE 0.77 11/29/2014 1512      Component Value Date/Time   CALCIUM  9.4 11/11/2024 1034   ALKPHOS 44 11/11/2024 1034   AST 22 11/11/2024 1034   ALT 16 11/11/2024 1034   BILITOT 0.5 11/11/2024 1034      Impression and Plan: Mr. Bornemann is a very nice 74 year old white male.  He has metastatic castrate sensitive prostate cancer.  He is castrate level testosterone .  His PSA has been undetectable.  I would like to hope that the Xofigo  will help him out.  Again, this is all quality of life.  Again we will see about referral to Neurosurgery.  Maybe, I will go ahead and order the MRI of his lumbar spine to see exactly what might be going on.  He is not due for Zometa  until March.  He gets the Eligard  at Urology.  I will plan to see him back in 6 weeks.   Maude JONELLE Crease, MD 1/30/202611:35 AM "

## 2024-11-12 LAB — TESTOSTERONE: Testosterone: <3 ng/dL — ABNORMAL LOW (ref 264–916)

## 2024-11-12 LAB — PSA, TOTAL AND FREE
PSA, Free Pct: UNDETERMINED %
PSA, Free: <0.02 ng/mL
Prostate Specific Ag, Serum: <0.1 ng/mL (ref 0.0–4.0)

## 2024-11-14 ENCOUNTER — Ambulatory Visit: Payer: Self-pay | Admitting: Hematology & Oncology

## 2024-11-15 ENCOUNTER — Telehealth: Payer: Self-pay | Admitting: *Deleted

## 2024-11-15 NOTE — Telephone Encounter (Signed)
 Called patient to inform of Xofigo  injection being rescheduledl for 11-21-24- arrival time- 11:45 am @ [facility], spoke with patient and he is aware of this injection

## 2024-11-17 ENCOUNTER — Ambulatory Visit: Admitting: Family Medicine

## 2024-11-18 ENCOUNTER — Ambulatory Visit (HOSPITAL_BASED_OUTPATIENT_CLINIC_OR_DEPARTMENT_OTHER): Admission: RE | Admit: 2024-11-18

## 2024-11-18 ENCOUNTER — Telehealth: Payer: Self-pay | Admitting: *Deleted

## 2024-11-18 ENCOUNTER — Other Ambulatory Visit (HOSPITAL_BASED_OUTPATIENT_CLINIC_OR_DEPARTMENT_OTHER)

## 2024-11-18 DIAGNOSIS — C61 Malignant neoplasm of prostate: Secondary | ICD-10-CM

## 2024-11-18 MED ORDER — GADOBUTROL 1 MMOL/ML IV SOLN
10.0000 mL | Freq: Once | INTRAVENOUS | Status: AC | PRN
  Administered 2024-11-18: 10 mL via INTRAVENOUS
  Filled 2024-11-18: qty 10

## 2024-11-18 NOTE — Telephone Encounter (Signed)
 CALLED PATIENT TO REMIND OF XOFIGO  INJ. FOR 11-21-24- ARRIVAL TIME 11:45 AM @ [facility], LVM FOR A RETURN CALL

## 2024-11-21 ENCOUNTER — Encounter (HOSPITAL_COMMUNITY)

## 2024-11-29 ENCOUNTER — Ambulatory Visit (HOSPITAL_BASED_OUTPATIENT_CLINIC_OR_DEPARTMENT_OTHER)

## 2024-12-07 ENCOUNTER — Ambulatory Visit: Admitting: Student

## 2024-12-20 ENCOUNTER — Ambulatory Visit: Admitting: Cardiology

## 2024-12-23 ENCOUNTER — Inpatient Hospital Stay: Admitting: Hematology & Oncology

## 2024-12-23 ENCOUNTER — Inpatient Hospital Stay

## 2025-01-20 ENCOUNTER — Ambulatory Visit (INDEPENDENT_AMBULATORY_CARE_PROVIDER_SITE_OTHER): Admitting: Physician Assistant

## 2025-04-12 ENCOUNTER — Ambulatory Visit: Admitting: Urology

## 2025-10-19 ENCOUNTER — Ambulatory Visit
